# Patient Record
Sex: Male | Born: 1935 | Race: Black or African American | Hispanic: No | Smoking: Current every day smoker
Health system: Southern US, Community
[De-identification: ages and names within clinical notes are randomized; demographics above are authoritative.]

## PROBLEM LIST (undated history)

## (undated) DIAGNOSIS — K219 Gastro-esophageal reflux disease without esophagitis: Secondary | ICD-10-CM

## (undated) DIAGNOSIS — N200 Calculus of kidney: Secondary | ICD-10-CM

## (undated) DIAGNOSIS — I10 Essential (primary) hypertension: Secondary | ICD-10-CM

## (undated) DIAGNOSIS — E785 Hyperlipidemia, unspecified: Secondary | ICD-10-CM

## (undated) HISTORY — DX: Gastro-esophageal reflux disease without esophagitis: K21.9

## (undated) HISTORY — DX: Hyperlipidemia, unspecified: E78.5

## (undated) HISTORY — PX: NO PAST SURGERIES: SHX2092

## (undated) HISTORY — DX: Calculus of kidney: N20.0

---

## 1999-12-30 ENCOUNTER — Emergency Department (HOSPITAL_COMMUNITY): Admission: EM | Admit: 1999-12-30 | Discharge: 1999-12-30 | Payer: Self-pay | Admitting: Emergency Medicine

## 2000-01-08 ENCOUNTER — Emergency Department (HOSPITAL_COMMUNITY): Admission: EM | Admit: 2000-01-08 | Discharge: 2000-01-08 | Payer: Self-pay | Admitting: Emergency Medicine

## 2008-07-19 ENCOUNTER — Emergency Department (HOSPITAL_COMMUNITY): Admission: EM | Admit: 2008-07-19 | Discharge: 2008-07-19 | Payer: Self-pay | Admitting: Emergency Medicine

## 2008-07-28 ENCOUNTER — Encounter: Payer: Self-pay | Admitting: Internal Medicine

## 2008-07-28 LAB — CONVERTED CEMR LAB
Bilirubin Urine: NEGATIVE
Glucose, Bld: 184 mg/dL
Hemoglobin: 12.8 g/dL
Nitrite: NEGATIVE
RBC: 3.96 M/uL
Sodium: 140 meq/L

## 2008-08-02 ENCOUNTER — Ambulatory Visit (HOSPITAL_COMMUNITY): Admission: RE | Admit: 2008-08-02 | Discharge: 2008-08-02 | Payer: Self-pay | Admitting: Urology

## 2008-08-26 ENCOUNTER — Emergency Department (HOSPITAL_COMMUNITY): Admission: EM | Admit: 2008-08-26 | Discharge: 2008-08-26 | Payer: Self-pay | Admitting: Family Medicine

## 2008-08-26 ENCOUNTER — Encounter: Payer: Self-pay | Admitting: Internal Medicine

## 2008-08-26 LAB — CONVERTED CEMR LAB
BUN: 10 mg/dL
Calcium: 8.6 mg/dL
Creatinine, Ser: 0.95 mg/dL
Glucose, Bld: 213 mg/dL
Potassium: 3.3 meq/L

## 2008-09-13 ENCOUNTER — Emergency Department (HOSPITAL_COMMUNITY): Admission: EM | Admit: 2008-09-13 | Discharge: 2008-09-13 | Payer: Self-pay | Admitting: Emergency Medicine

## 2008-09-13 ENCOUNTER — Encounter: Payer: Self-pay | Admitting: Internal Medicine

## 2008-09-13 LAB — CONVERTED CEMR LAB
BUN: 17 mg/dL
Chloride: 103 meq/L
Creatinine, Ser: 1.3 mg/dL
HCT: 42 %
Hemoglobin: 14.3 g/dL

## 2008-09-21 ENCOUNTER — Encounter: Payer: Self-pay | Admitting: Internal Medicine

## 2008-09-21 LAB — CONVERTED CEMR LAB
Calcium: 9.6 mg/dL
Creatinine, Ser: 1.63 mg/dL
Glucose, Bld: 396 mg/dL
HCT: 39.9 %
Hemoglobin: 13.7 g/dL
Potassium: 5.6 meq/L
RBC: 4.33 M/uL
Sodium: 134 meq/L
WBC: 8.7 10*3/uL

## 2008-09-22 ENCOUNTER — Ambulatory Visit (HOSPITAL_COMMUNITY): Admission: RE | Admit: 2008-09-22 | Discharge: 2008-09-22 | Payer: Self-pay | Admitting: Urology

## 2008-10-02 ENCOUNTER — Encounter: Payer: Self-pay | Admitting: Internal Medicine

## 2008-10-02 LAB — CONVERTED CEMR LAB
BUN: 12 mg/dL
Chloride: 103 meq/L
Ketones, urine, test strip: NEGATIVE
Nitrite: NEGATIVE
Potassium: 4.1 meq/L
Sodium: 136 meq/L
Specific Gravity, Urine: 1.011
Urobilinogen, UA: 0.2
pH: 6

## 2008-11-03 ENCOUNTER — Emergency Department (HOSPITAL_COMMUNITY): Admission: EM | Admit: 2008-11-03 | Discharge: 2008-11-03 | Payer: Self-pay | Admitting: Emergency Medicine

## 2008-12-02 ENCOUNTER — Ambulatory Visit (HOSPITAL_COMMUNITY): Admission: RE | Admit: 2008-12-02 | Discharge: 2008-12-02 | Payer: Self-pay | Admitting: Urology

## 2008-12-04 ENCOUNTER — Emergency Department (HOSPITAL_COMMUNITY): Admission: EM | Admit: 2008-12-04 | Discharge: 2008-12-04 | Payer: Self-pay | Admitting: Emergency Medicine

## 2008-12-23 ENCOUNTER — Emergency Department (HOSPITAL_COMMUNITY): Admission: EM | Admit: 2008-12-23 | Discharge: 2008-12-24 | Payer: Self-pay | Admitting: Emergency Medicine

## 2009-01-04 ENCOUNTER — Encounter: Payer: Self-pay | Admitting: Internal Medicine

## 2009-01-04 DIAGNOSIS — N200 Calculus of kidney: Secondary | ICD-10-CM

## 2009-01-04 DIAGNOSIS — I10 Essential (primary) hypertension: Secondary | ICD-10-CM | POA: Insufficient documentation

## 2009-01-04 HISTORY — DX: Calculus of kidney: N20.0

## 2009-01-10 ENCOUNTER — Ambulatory Visit: Payer: Self-pay | Admitting: Internal Medicine

## 2009-01-10 DIAGNOSIS — E119 Type 2 diabetes mellitus without complications: Secondary | ICD-10-CM | POA: Insufficient documentation

## 2009-01-10 DIAGNOSIS — E1165 Type 2 diabetes mellitus with hyperglycemia: Secondary | ICD-10-CM

## 2009-01-10 DIAGNOSIS — K219 Gastro-esophageal reflux disease without esophagitis: Secondary | ICD-10-CM

## 2009-01-11 LAB — CONVERTED CEMR LAB
Basophils Relative: 0.7 % (ref 0.0–3.0)
Calcium: 9.5 mg/dL (ref 8.4–10.5)
HCT: 42.2 % (ref 39.0–52.0)
Lymphs Abs: 2.7 10*3/uL (ref 0.7–4.0)
MCHC: 34.1 g/dL (ref 30.0–36.0)
MCV: 95.8 fL (ref 78.0–100.0)
Monocytes Relative: 10.2 % (ref 3.0–12.0)
Neutro Abs: 5.4 10*3/uL (ref 1.4–7.7)
Neutrophils Relative %: 58.4 % (ref 43.0–77.0)
Platelets: 272 10*3/uL (ref 150.0–400.0)
RBC: 4.41 M/uL (ref 4.22–5.81)
TSH: 1.43 microintl units/mL (ref 0.35–5.50)
WBC: 9.2 10*3/uL (ref 4.5–10.5)

## 2009-01-12 ENCOUNTER — Ambulatory Visit: Payer: Self-pay | Admitting: Internal Medicine

## 2009-01-14 ENCOUNTER — Encounter: Payer: Self-pay | Admitting: Internal Medicine

## 2009-01-14 ENCOUNTER — Ambulatory Visit: Payer: Self-pay | Admitting: Internal Medicine

## 2009-01-18 ENCOUNTER — Ambulatory Visit: Payer: Self-pay | Admitting: Internal Medicine

## 2009-01-18 ENCOUNTER — Encounter (INDEPENDENT_AMBULATORY_CARE_PROVIDER_SITE_OTHER): Payer: Self-pay | Admitting: *Deleted

## 2009-01-18 ENCOUNTER — Telehealth: Payer: Self-pay | Admitting: Internal Medicine

## 2009-01-18 DIAGNOSIS — E785 Hyperlipidemia, unspecified: Secondary | ICD-10-CM

## 2009-01-18 LAB — CONVERTED CEMR LAB
Blood Glucose, Fingerstick: 361
Cholesterol: 212 mg/dL — ABNORMAL HIGH (ref 0–200)
Direct LDL: 117.7 mg/dL
Microalb Creat Ratio: 23 mg/g (ref 0.0–30.0)
Triglycerides: 245 mg/dL — ABNORMAL HIGH (ref 0.0–149.0)

## 2009-01-18 LAB — HM DIABETES FOOT EXAM

## 2009-01-26 ENCOUNTER — Encounter: Payer: Self-pay | Admitting: Internal Medicine

## 2009-01-31 ENCOUNTER — Telehealth: Payer: Self-pay | Admitting: Internal Medicine

## 2009-02-01 ENCOUNTER — Encounter (INDEPENDENT_AMBULATORY_CARE_PROVIDER_SITE_OTHER): Payer: Self-pay | Admitting: *Deleted

## 2009-02-01 ENCOUNTER — Telehealth: Payer: Self-pay | Admitting: Internal Medicine

## 2009-02-04 ENCOUNTER — Encounter (INDEPENDENT_AMBULATORY_CARE_PROVIDER_SITE_OTHER): Payer: Self-pay | Admitting: *Deleted

## 2009-02-23 ENCOUNTER — Ambulatory Visit: Payer: Self-pay | Admitting: Internal Medicine

## 2009-02-23 DIAGNOSIS — R630 Anorexia: Secondary | ICD-10-CM

## 2009-02-23 LAB — CONVERTED CEMR LAB
ALT: 14 units/L (ref 0–53)
AST: 14 units/L (ref 0–37)
Albumin: 4.1 g/dL (ref 3.5–5.2)
Alkaline Phosphatase: 71 units/L (ref 39–117)
BUN: 22 mg/dL (ref 6–23)
CO2: 27 meq/L (ref 19–32)
Calcium: 9.7 mg/dL (ref 8.4–10.5)
Chloride: 106 meq/L (ref 96–112)
Creatinine, Ser: 1.5 mg/dL (ref 0.4–1.5)
Glucose, Bld: 179 mg/dL — ABNORMAL HIGH (ref 70–99)
Total Protein: 7.3 g/dL (ref 6.0–8.3)

## 2009-03-25 ENCOUNTER — Ambulatory Visit: Payer: Self-pay | Admitting: Internal Medicine

## 2009-03-25 ENCOUNTER — Telehealth: Payer: Self-pay | Admitting: Internal Medicine

## 2009-04-19 ENCOUNTER — Ambulatory Visit: Payer: Self-pay | Admitting: Internal Medicine

## 2009-05-10 ENCOUNTER — Emergency Department (HOSPITAL_COMMUNITY): Admission: EM | Admit: 2009-05-10 | Discharge: 2009-05-10 | Payer: Self-pay | Admitting: Emergency Medicine

## 2009-12-12 ENCOUNTER — Emergency Department (HOSPITAL_COMMUNITY): Admission: EM | Admit: 2009-12-12 | Discharge: 2009-12-13 | Payer: Self-pay | Admitting: Emergency Medicine

## 2009-12-13 ENCOUNTER — Emergency Department (HOSPITAL_COMMUNITY): Admission: EM | Admit: 2009-12-13 | Discharge: 2009-12-13 | Payer: Self-pay | Admitting: Emergency Medicine

## 2010-09-03 LAB — CBC
HCT: 44.6 % (ref 39.0–52.0)
Hemoglobin: 15.1 g/dL (ref 13.0–17.0)
MCH: 32.7 pg (ref 26.0–34.0)
MCHC: 33.9 g/dL (ref 30.0–36.0)
MCV: 96.6 fL (ref 78.0–100.0)
Platelets: 203 10*3/uL (ref 150–400)
RDW: 13.5 % (ref 11.5–15.5)

## 2010-09-03 LAB — URINALYSIS, ROUTINE W REFLEX MICROSCOPIC
Nitrite: NEGATIVE
Specific Gravity, Urine: 1.014 (ref 1.005–1.030)
Urobilinogen, UA: 1 mg/dL (ref 0.0–1.0)
pH: 5.5 (ref 5.0–8.0)

## 2010-09-03 LAB — URINE CULTURE: Colony Count: NO GROWTH

## 2010-09-03 LAB — HEPATIC FUNCTION PANEL
ALT: 16 U/L (ref 0–53)
AST: 15 U/L (ref 0–37)
Albumin: 4.2 g/dL (ref 3.5–5.2)
Total Bilirubin: 1.1 mg/dL (ref 0.3–1.2)

## 2010-09-03 LAB — URINE MICROSCOPIC-ADD ON

## 2010-09-03 LAB — BASIC METABOLIC PANEL
BUN: 21 mg/dL (ref 6–23)
CO2: 30 mEq/L (ref 19–32)
GFR calc Af Amer: >60 mL/min (ref >60)
Glucose, Bld: 147 mg/dL — ABNORMAL HIGH (ref 70–99)
Sodium: 147 mEq/L — ABNORMAL HIGH (ref 135–145)

## 2010-09-03 LAB — DIFFERENTIAL
Eosinophils Absolute: 0 10*3/uL (ref 0.0–0.7)
Monocytes Relative: 6 % (ref 3–12)
Neutro Abs: 8.6 10*3/uL — ABNORMAL HIGH (ref 1.7–7.7)

## 2010-09-20 LAB — DIFFERENTIAL
Basophils Absolute: 0.1 10*3/uL (ref 0.0–0.1)
Basophils Relative: 1 % (ref 0–1)
Eosinophils Absolute: 0.4 10*3/uL (ref 0.0–0.7)
Eosinophils Relative: 6 % — ABNORMAL HIGH (ref 0–5)
Lymphocytes Relative: 26 % (ref 12–46)
Monocytes Relative: 13 % — ABNORMAL HIGH (ref 3–12)
Neutrophils Relative %: 55 % (ref 43–77)

## 2010-09-20 LAB — CBC
HCT: 37.2 % — ABNORMAL LOW (ref 39.0–52.0)
Hemoglobin: 13 g/dL (ref 13.0–17.0)
Platelets: 196 10*3/uL (ref 150–400)
WBC: 6.9 10*3/uL (ref 4.0–10.5)

## 2010-09-20 LAB — POCT I-STAT, CHEM 8
BUN: 14 mg/dL (ref 6–23)
Calcium, Ion: 1.24 mmol/L (ref 1.12–1.32)
Creatinine, Ser: 1.2 mg/dL (ref 0.4–1.5)
TCO2: 26 mmol/L (ref 0–100)

## 2010-09-24 LAB — GLUCOSE, CAPILLARY
Glucose-Capillary: 250 mg/dL — ABNORMAL HIGH (ref 70–99)
Glucose-Capillary: 261 mg/dL — ABNORMAL HIGH (ref 70–99)

## 2010-09-25 LAB — BASIC METABOLIC PANEL
Calcium: 9 mg/dL (ref 8.4–10.5)
Creatinine, Ser: 1.08 mg/dL (ref 0.4–1.5)
GFR calc non Af Amer: >60 mL/min (ref >60)
Glucose, Bld: 305 mg/dL — ABNORMAL HIGH (ref 70–99)
Sodium: 136 mEq/L (ref 135–145)

## 2010-09-25 LAB — URINALYSIS, ROUTINE W REFLEX MICROSCOPIC
Bilirubin Urine: NEGATIVE
Nitrite: NEGATIVE
Specific Gravity, Urine: 1.011 (ref 1.005–1.030)
pH: 6 (ref 5.0–8.0)

## 2010-09-25 LAB — URINE MICROSCOPIC-ADD ON

## 2010-09-25 LAB — CBC
Hemoglobin: 11 g/dL — ABNORMAL LOW (ref 13.0–17.0)
Platelets: 188 10*3/uL (ref 150–400)
RDW: 14.5 % (ref 11.5–15.5)

## 2010-09-27 LAB — BASIC METABOLIC PANEL
GFR calc Af Amer: 51 mL/min — ABNORMAL LOW (ref >60)
GFR calc non Af Amer: 42 mL/min — ABNORMAL LOW (ref >60)
Potassium: 5.6 mEq/L — ABNORMAL HIGH (ref 3.5–5.1)
Sodium: 134 mEq/L — ABNORMAL LOW (ref 135–145)

## 2010-09-27 LAB — CBC
HCT: 39.9 % (ref 39.0–52.0)
Hemoglobin: 13.7 g/dL (ref 13.0–17.0)
RBC: 4.33 MIL/uL (ref 4.22–5.81)
WBC: 8.7 10*3/uL (ref 4.0–10.5)

## 2010-09-28 LAB — URINALYSIS, ROUTINE W REFLEX MICROSCOPIC
Glucose, UA: NEGATIVE mg/dL
Hgb urine dipstick: NEGATIVE
Ketones, ur: NEGATIVE mg/dL
Protein, ur: NEGATIVE mg/dL
pH: 6.5 (ref 5.0–8.0)

## 2010-09-28 LAB — POCT I-STAT, CHEM 8
BUN: 17 mg/dL (ref 6–23)
Calcium, Ion: 1.25 mmol/L (ref 1.12–1.32)
Chloride: 103 mEq/L (ref 96–112)
Glucose, Bld: 360 mg/dL — ABNORMAL HIGH (ref 70–99)
Potassium: 4.7 mEq/L (ref 3.5–5.1)

## 2010-09-28 LAB — URINE MICROSCOPIC-ADD ON

## 2010-09-28 LAB — BASIC METABOLIC PANEL
BUN: 10 mg/dL (ref 6–23)
CO2: 24 mEq/L (ref 19–32)
Chloride: 101 mEq/L (ref 96–112)
Creatinine, Ser: 0.95 mg/dL (ref 0.4–1.5)
Potassium: 3.3 mEq/L — ABNORMAL LOW (ref 3.5–5.1)

## 2010-10-03 LAB — URINALYSIS, ROUTINE W REFLEX MICROSCOPIC
Bilirubin Urine: NEGATIVE
Bilirubin Urine: NEGATIVE
Ketones, ur: 15 mg/dL — AB
Ketones, ur: NEGATIVE mg/dL
Nitrite: NEGATIVE
Specific Gravity, Urine: 1.011 (ref 1.005–1.030)
Urobilinogen, UA: 0.2 mg/dL (ref 0.0–1.0)
Urobilinogen, UA: 0.2 mg/dL (ref 0.0–1.0)
pH: 5.5 (ref 5.0–8.0)
pH: 6 (ref 5.0–8.0)

## 2010-10-03 LAB — URINE CULTURE
Colony Count: NO GROWTH
Culture: NO GROWTH

## 2010-10-03 LAB — CBC
HCT: 37.5 % — ABNORMAL LOW (ref 39.0–52.0)
Hemoglobin: 12.8 g/dL — ABNORMAL LOW (ref 13.0–17.0)
Platelets: 129 10*3/uL — ABNORMAL LOW (ref 150–400)
RBC: 4.73 MIL/uL (ref 4.22–5.81)
RBC: 3.96 MIL/uL — ABNORMAL LOW (ref 4.22–5.81)
WBC: 11 10*3/uL — ABNORMAL HIGH (ref 4.0–10.5)

## 2010-10-03 LAB — COMPREHENSIVE METABOLIC PANEL
ALT: 15 U/L (ref 0–53)
AST: 13 U/L (ref 0–37)
Albumin: 3.3 g/dL — ABNORMAL LOW (ref 3.5–5.2)
CO2: 27 mEq/L (ref 19–32)
Chloride: 93 mEq/L — ABNORMAL LOW (ref 96–112)
Creatinine, Ser: 2.35 mg/dL — ABNORMAL HIGH (ref 0.4–1.5)
GFR calc Af Amer: 33 mL/min — ABNORMAL LOW (ref >60)
GFR calc non Af Amer: 27 mL/min — ABNORMAL LOW (ref >60)
Potassium: 4 mEq/L (ref 3.5–5.1)
Sodium: 131 mEq/L — ABNORMAL LOW (ref 135–145)
Total Bilirubin: 1.1 mg/dL (ref 0.3–1.2)

## 2010-10-03 LAB — DIFFERENTIAL
Basophils Absolute: 0 10*3/uL (ref 0.0–0.1)
Eosinophils Absolute: 0 10*3/uL (ref 0.0–0.7)
Eosinophils Relative: 0 % (ref 0–5)
Lymphocytes Relative: 3 % — ABNORMAL LOW (ref 12–46)
Monocytes Absolute: 0.7 10*3/uL (ref 0.1–1.0)

## 2010-10-03 LAB — BASIC METABOLIC PANEL
CO2: 30 mEq/L (ref 19–32)
Glucose, Bld: 184 mg/dL — ABNORMAL HIGH (ref 70–99)
Potassium: 4.7 mEq/L (ref 3.5–5.1)
Sodium: 140 mEq/L (ref 135–145)

## 2010-10-03 LAB — URINE MICROSCOPIC-ADD ON

## 2010-10-31 NOTE — Op Note (Signed)
Vincent Baxter, FESTER NO.:  [id number]   MEDICAL RECORD NO.:  [id number]          PATIENT TYPE:  AMB   LOCATION:  DAY                          FACILITY:  [facility]   PHYSICIAN:  Courtney Paris, M.D.DATE OF BIRTH:  03/13/1935   DATE OF PROCEDURE:  09/22/2008  DATE OF DISCHARGE:                               OPERATIVE REPORT   PREOPERATIVE DIAGNOSIS:  Left distal ureteral stone.   POSTOPERATIVE DIAGNOSIS:  Left distal ureteral stone.   OPERATION PERFORMED:  1. Cystourethroscopy.  2. Left urethroscopy, laser lithotripsy, basket stone extraction.  3. Left ureteral stent placement.   SURGEON:  Courtney Paris, M.D.   RESIDENTGeorgeanna Lea.   ANESTHESIA:  General.   DRAINS:  6 x 26 left double-J ureteral stent with tethers.   COMPLICATIONS:  None.   INDICATIONS FOR PROCEDURE:  The patient is a 76 year old male who in  February was diagnosed with a 1.5 cm left ureterovesical junction stone  on a CT scan done for left flank pain.  The patient underwent left ESWL  on August 02, 2008.  The patient, however, continued to have left  flank pain and on recent CT scan was found to have a left distal  ureteral stone.  Different treatment options were discussed.  The  patient opted for left ureteroscopy.  Risks and benefits of the  procedure were explained.  Informed consent obtained.   DESCRIPTION OF PROCEDURE:  The patient was brought to the operating  room.  Placed in supine position.  Administered general anesthesia by  the anesthesia team.  Proper time out performed confirming correct  patient, procedure and side.  The patient was given an appropriate  preoperative antibiotic.  The patient was subsequently placed in dorsal  lithotomy position.  Pressure points were well padded.  Bilateral lower  extremity SCDs.  The patient was prepped and draped in the usual sterile  manner.  Using a 22 French sheath and 12 degree lens, we then performed  cystourethroscopy.  The patient's anterior urethra was normal.  Posterior urethra revealed mild to moderate bilobar prostatic  enlargement.  Upon entering the bladder, 8 mm stone particle was seen  half way through the left ureteral orifice.  Rest of the cystoscopy did  not reveal any mass lesion or stone.  Attempt was made using cold knife  to get the stone out as it was impacted.  However, during the procedure,  the stone slipped into the left ureteral orifice.  At this point Glide  wire was placed in the left renal pelvis under fluoroscopy.  We then  removed the cystoscope and advanced semirigid ureteroscope into the left  ureteral orifice.  Stone was seen in the very distal part of the ureter.  A 200 micron laser fiber was introduced and the stone was broken down to  tiny pieces.  We then removed the laser fiber and advanced escape basket  and extracted the stone particles.  These were removed from the bladder.  We then removed the ureteroscope and placed a 6 x 26 double-J ureteral  stent with tethers under fluoroscopic  and endoscopic guidance in the  left renal pelvis.  Good proximal and distal coils were seen.  We then  drained the patient's bladder and removed the cystoscope.  We then  secured the tether onto the dorsal aspect of the penis with tape.  This  marked the end of the procedure.  The patient was subsequently extubated  and transferred in stable condition to recovery room.   Of note, Courtney Paris, M.D. was present and participated in all  aspects of the case.      Delman Kitten, MD      Courtney Paris, M.D.  Electronically Signed    DW/MEDQ  D:  09/22/2008  T:  09/22/2008  Job:  [id number]

## 2011-11-14 ENCOUNTER — Emergency Department (HOSPITAL_COMMUNITY)
Admission: EM | Admit: 2011-11-14 | Discharge: 2011-11-14 | Disposition: A | Discharge status: Home / Self Care | Payer: Self-pay | Attending: Emergency Medicine | Admitting: Emergency Medicine

## 2011-11-14 ENCOUNTER — Encounter (HOSPITAL_COMMUNITY): Payer: Self-pay | Admitting: *Deleted

## 2011-11-14 ENCOUNTER — Emergency Department (HOSPITAL_COMMUNITY): Payer: Self-pay

## 2011-11-14 DIAGNOSIS — R5381 Other malaise: Secondary | ICD-10-CM | POA: Insufficient documentation

## 2011-11-14 DIAGNOSIS — R110 Nausea: Secondary | ICD-10-CM | POA: Insufficient documentation

## 2011-11-14 DIAGNOSIS — R059 Cough, unspecified: Secondary | ICD-10-CM | POA: Insufficient documentation

## 2011-11-14 DIAGNOSIS — J438 Other emphysema: Secondary | ICD-10-CM | POA: Diagnosis not present

## 2011-11-14 DIAGNOSIS — E119 Type 2 diabetes mellitus without complications: Secondary | ICD-10-CM | POA: Insufficient documentation

## 2011-11-14 DIAGNOSIS — R918 Other nonspecific abnormal finding of lung field: Secondary | ICD-10-CM | POA: Diagnosis not present

## 2011-11-14 DIAGNOSIS — R079 Chest pain, unspecified: Secondary | ICD-10-CM | POA: Diagnosis not present

## 2011-11-14 DIAGNOSIS — I10 Essential (primary) hypertension: Secondary | ICD-10-CM | POA: Insufficient documentation

## 2011-11-14 DIAGNOSIS — R05 Cough: Secondary | ICD-10-CM | POA: Insufficient documentation

## 2011-11-14 HISTORY — DX: Essential (primary) hypertension: I10

## 2011-11-14 LAB — URINALYSIS, ROUTINE W REFLEX MICROSCOPIC
Bilirubin Urine: NEGATIVE
Glucose, UA: NEGATIVE mg/dL
Hgb urine dipstick: NEGATIVE
Specific Gravity, Urine: 1.015 (ref 1.005–1.030)
pH: 5.5 (ref 5.0–8.0)

## 2011-11-14 LAB — POCT I-STAT, CHEM 8
Calcium, Ion: 1.21 mmol/L (ref 1.12–1.32)
Creatinine, Ser: 1.5 mg/dL — ABNORMAL HIGH (ref 0.50–1.35)
Glucose, Bld: 159 mg/dL — ABNORMAL HIGH (ref 70–99)
HCT: 46 % (ref 39.0–52.0)
Hemoglobin: 15.6 g/dL (ref 13.0–17.0)
Potassium: 4.4 mEq/L (ref 3.5–5.1)
TCO2: 27 mmol/L (ref 0–100)

## 2011-11-14 MED ORDER — ONDANSETRON 4 MG PO TBDP: ORAL_TABLET | ORAL | Status: DC

## 2011-11-14 MED ORDER — ONDANSETRON 4 MG PO TBDP
4.0000 mg | ORAL_TABLET | Freq: Once | ORAL | Status: AC
  Administered 2011-11-14: 4 mg via ORAL
  Filled 2011-11-14: qty 1

## 2011-11-14 NOTE — ED Notes (Addendum)
Pt resting in stretcher in NAD, respirations even and unlabored. 

## 2011-11-14 NOTE — ED Provider Notes (Signed)
History     CSN: [id number]  Arrival date & time 11/14/11  1844   First MD Initiated Contact with Patient 11/14/11 2026      Chief Complaint  Patient presents with  . Weakness  . Nausea    (Consider location/radiation/quality/duration/timing/severity/associated sxs/prior treatment) HPI Comments: Pt with hx of htn, dm - not taking meds x 1 year.  C/o generalized fatigue, nausea, and coughing up mucous worsening over the last few months.  No chest, abdominal pain or dysnea.  Has not seen his PCP because he thought he was better off without meds.  Patient is a 77 y.o. male presenting with weakness. The history is provided by the patient.  Weakness The primary symptoms include nausea. Primary symptoms do not include headaches, loss of consciousness, altered mental status, fever or vomiting. Episode duration: months. The symptoms are unchanged. The neurological symptoms are diffuse. Context: no injury.  Additional symptoms include weakness.    Past Medical History  Diagnosis Date  . Diabetes mellitus   . Hypertension     History reviewed. No pertinent past surgical history.  History reviewed. No pertinent family history.  History  Substance Use Topics  . Smoking status: Not on file  . Smokeless tobacco: Not on file  . Alcohol Use:       Review of Systems  Constitutional: Negative for fever, activity change and fatigue.  HENT: Negative for congestion.   Eyes: Negative for pain.  Respiratory: Negative for chest tightness, shortness of breath, wheezing and stridor.   Cardiovascular: Negative for chest pain and leg swelling.  Gastrointestinal: Positive for nausea. Negative for vomiting.  Genitourinary: Negative for dysuria.  Musculoskeletal: Negative for arthralgias.  Skin: Negative for rash.  Neurological: Positive for weakness. Negative for loss of consciousness and headaches.  Psychiatric/Behavioral: Negative for behavioral problems and altered mental status.     Allergies  Review of patient's allergies indicates no known allergies.  Home Medications   Current Outpatient Rx  Name Route Sig Dispense Refill  . ONDANSETRON 4 MG PO TBDP  4mg  ODT q4 hours prn nausea/vomit 15 tablet 0    BP 168/114  Pulse 93  Temp(Src) 98 F (36.7 C) (Oral)  Resp 18  SpO2 99%  Physical Exam  Constitutional: He is oriented to person, place, and time. He appears well-developed and well-nourished. No distress.  HENT:  Head: Normocephalic and atraumatic.  Eyes: Conjunctivae and EOM are normal. Pupils are equal, round, and reactive to light. No scleral icterus.  Neck: Normal range of motion. Neck supple.  Cardiovascular: Normal rate and regular rhythm.  Exam reveals no gallop and no friction rub.   No murmur heard. Pulmonary/Chest: Effort normal and breath sounds normal. No respiratory distress. He has no wheezes. He has no rales. He exhibits no tenderness.  Abdominal: Soft. He exhibits no distension and no mass. There is no tenderness. There is no rebound and no guarding.  Musculoskeletal: Normal range of motion. He exhibits no edema and no tenderness.  Neurological: He is alert and oriented to person, place, and time. He has normal reflexes. No cranial nerve deficit. He exhibits normal muscle tone. Coordination normal.  Skin: Skin is warm and dry. No rash noted. He is not diaphoretic. No erythema.  Psychiatric: He has a normal mood and affect. His behavior is normal. Judgment and thought content normal.    ED Course  Procedures (including critical care time)   Date: 11/14/2011  Rate: 83  Rhythm: normal sinus rhythm  QRS Axis: normal  Intervals: normal  ST/T Wave abnormalities: normal  Conduction Disutrbances:none  Narrative Interpretation:   Old EKG Reviewed: unchanged    Labs Reviewed  URINALYSIS, ROUTINE W REFLEX MICROSCOPIC - Abnormal; Notable for the following:    Leukocytes, UA TRACE (*)    All other components within normal limits  POCT  I-STAT, CHEM 8 - Abnormal; Notable for the following:    Creatinine, Ser 1.50 (*)    Glucose, Bld 159 (*)    All other components within normal limits  URINE MICROSCOPIC-ADD ON   Dg Chest 2 View  11/14/2011  *RADIOLOGY REPORT*  Clinical Data: Weakness nausea and shortness of breath.  The right lower posterior chest pain.  CHEST - 2 VIEW  Comparison: Two-view chest 05/10/2009.  Findings: The heart size is normal.  Advanced emphysematous changes are again noted.  No focal airspace disease is evident.  The visualized soft tissues and bony thorax are unremarkable.  IMPRESSION:  1.  No acute cardiopulmonary disease or significant interval change. 2.  Emphysema.  Original Report Authenticated By: Jamesetta Orleans. MATTERN, M.D.     1. Cough   2. Nausea       MDM  Pt with hx of htn, dm - not taking meds x 1 year.  C/o generalized fatigue, nausea, and coughing up mucous worsening over the last few months.  No chest, abdominal pain or dysnea.  Has not seen his PCP because he thought he was better off without meds.  VSS and well appearing.  No abdominal ttp on exam.  Doubt acute intraabdominal pathology.  Labs, EKG, CXR unconcerning.  Treating nausea with zofran and PCP f/u.  Pt comfortable with plan and will follow up.         Army Chaco, MD 11/14/11 [phone]

## 2011-11-14 NOTE — ED Notes (Signed)
Urine sent to lab 

## 2011-11-14 NOTE — ED Notes (Signed)
MD Rancour aware of pt's BP and pt ok for d/c.  Pt agreed to quit smoking and is going to call his PCP tomorrow.

## 2011-11-14 NOTE — ED Notes (Signed)
MD at bedside. 

## 2011-11-14 NOTE — ED Notes (Signed)
Pt ambulated around ED, walked to RR with steady gait.  Pt states he vomited on his gown.  No vomit noted on gown.  Provided pt with new gown.

## 2011-11-14 NOTE — ED Notes (Signed)
To ED for eval of feeling weak and with decreased appetite over the past cple months. Pt states he has intermittent flank pain and intermittent cp. States he has been dx with 'sugar things but doesn't take anything for it'. Denies CP. Denies SOB. States he has been urinating a lot and doesn't have the energy to do his daily routine.

## 2011-11-14 NOTE — Discharge Instructions (Signed)
Cough, Adult  A cough is a reflex that helps clear your throat and airways. It can help heal the body or may be a reaction to an irritated airway. A cough may only last 2 or 3 weeks (acute) or may last more than 8 weeks (chronic).  CAUSES Acute cough:  Viral or bacterial infections.  Chronic cough:  Infections.   Allergies.   Asthma.   Post-nasal drip.   Smoking.   Heartburn or acid reflux.   Some medicines.   Chronic lung problems (COPD).   Cancer.  SYMPTOMS   Cough.   Fever.   Chest pain.   Increased breathing rate.   High-pitched whistling sound when breathing (wheezing).   Colored mucus that you cough up (sputum).  TREATMENT   A bacterial cough may be treated with antibiotic medicine.   A viral cough must run its course and will not respond to antibiotics.   Your caregiver may recommend other treatments if you have a chronic cough.  HOME CARE INSTRUCTIONS   Only take over-the-counter or prescription medicines for pain, discomfort, or fever as directed by your caregiver. Use cough suppressants only as directed by your caregiver.   Use a cold steam vaporizer or humidifier in your bedroom or home to help loosen secretions.   Sleep in a semi-upright position if your cough is worse at night.   Rest as needed.   Stop smoking if you smoke.  SEEK IMMEDIATE MEDICAL CARE IF:   You have pus in your sputum.   Your cough starts to worsen.   You cannot control your cough with suppressants and are losing sleep.   You begin coughing up blood.   You have difficulty breathing.   You develop pain which is getting worse or is uncontrolled with medicine.   You have a fever.  MAKE SURE YOU:   Understand these instructions.   Will watch your condition.   Will get help right away if you are not doing well or get worse.  Document Released: 12/01/2010 Document Revised: 05/24/2011 Document Reviewed: 12/01/2010 [facility] Patient Information 2012 [place],  [place].Nausea, Adult Nausea is the feeling that you have an upset stomach or have to vomit. Nausea by itself is not likely a serious concern, but it may be an early sign of more serious medical problems. As nausea gets worse, it can lead to vomiting. If vomiting develops, there is the risk of dehydration.  CAUSES   Viral infections.   Food poisoning.   Medicines.   Pregnancy.   Motion sickness.   Migraine headaches.   Emotional distress.   Severe pain from any source.   Alcohol intoxication.  HOME CARE INSTRUCTIONS  Get plenty of rest.   Ask your caregiver about specific rehydration instructions.   Eat small amounts of food and sip liquids more often.   Take all medicines as told by your caregiver.  SEEK MEDICAL CARE IF:  You have not improved after 2 days, or you get worse.   You have a headache.  SEEK IMMEDIATE MEDICAL CARE IF:   You have a fever.   You faint.   You keep vomiting or have blood in your vomit.   You are extremely weak or dehydrated.   You have dark or bloody stools.   You have severe chest or abdominal pain.  MAKE SURE YOU:  Understand these instructions.   Will watch your condition.   Will get help right away if you are not doing well or get worse.  Document  Released: 07/12/2004 Document Revised: 05/24/2011 Document Reviewed: 02/14/2011 [facility] Patient Information 2012 [place], [place].

## 2011-11-14 NOTE — ED Notes (Signed)
Pt reports he does not want to go home at this time, pt states "feeling like I do, I have [employer], I can't go home feeling like this, I have no appetite, I will not go home, I will stay here a week if I need to."

## 2011-11-15 NOTE — ED Provider Notes (Signed)
I saw and evaluated the patient, reviewed the resident's note and I agree with the findings and plan.  Fatigue, nausea, cough. Noncompliance with DM and HTN meds.  Appears well.  Glynn Octave, MD 11/15/11 1235

## 2011-11-16 ENCOUNTER — Encounter (HOSPITAL_COMMUNITY): Payer: Self-pay | Admitting: *Deleted

## 2011-11-16 ENCOUNTER — Emergency Department (HOSPITAL_COMMUNITY): Payer: Medicare Other

## 2011-11-16 ENCOUNTER — Emergency Department (HOSPITAL_COMMUNITY)
Admission: EM | Admit: 2011-11-16 | Discharge: 2011-11-16 | Disposition: A | Discharge status: Home / Self Care | Payer: Medicare Other | Attending: Emergency Medicine | Admitting: Emergency Medicine

## 2011-11-16 DIAGNOSIS — R109 Unspecified abdominal pain: Secondary | ICD-10-CM | POA: Insufficient documentation

## 2011-11-16 DIAGNOSIS — I714 Abdominal aortic aneurysm, without rupture: Secondary | ICD-10-CM | POA: Diagnosis not present

## 2011-11-16 DIAGNOSIS — N200 Calculus of kidney: Secondary | ICD-10-CM | POA: Insufficient documentation

## 2011-11-16 DIAGNOSIS — Z87442 Personal history of urinary calculi: Secondary | ICD-10-CM | POA: Insufficient documentation

## 2011-11-16 DIAGNOSIS — M549 Dorsalgia, unspecified: Secondary | ICD-10-CM | POA: Insufficient documentation

## 2011-11-16 DIAGNOSIS — R112 Nausea with vomiting, unspecified: Secondary | ICD-10-CM | POA: Insufficient documentation

## 2011-11-16 DIAGNOSIS — E119 Type 2 diabetes mellitus without complications: Secondary | ICD-10-CM | POA: Diagnosis not present

## 2011-11-16 DIAGNOSIS — I10 Essential (primary) hypertension: Secondary | ICD-10-CM | POA: Insufficient documentation

## 2011-11-16 DIAGNOSIS — N269 Renal sclerosis, unspecified: Secondary | ICD-10-CM | POA: Diagnosis not present

## 2011-11-16 DIAGNOSIS — F17200 Nicotine dependence, unspecified, uncomplicated: Secondary | ICD-10-CM | POA: Insufficient documentation

## 2011-11-16 LAB — URINALYSIS, ROUTINE W REFLEX MICROSCOPIC
Bilirubin Urine: NEGATIVE
Glucose, UA: NEGATIVE mg/dL
Ketones, ur: NEGATIVE mg/dL
Specific Gravity, Urine: 1.018 (ref 1.005–1.030)
pH: 5.5 (ref 5.0–8.0)

## 2011-11-16 LAB — POCT I-STAT, CHEM 8
Chloride: 103 mEq/L (ref 96–112)
Glucose, Bld: 182 mg/dL — ABNORMAL HIGH (ref 70–99)
HCT: 44 % (ref 39.0–52.0)
Hemoglobin: 15 g/dL (ref 13.0–17.0)
Potassium: 4.3 mEq/L (ref 3.5–5.1)

## 2011-11-16 LAB — URINE MICROSCOPIC-ADD ON

## 2011-11-16 MED ORDER — SODIUM CHLORIDE 0.9 % IV SOLN
INTRAVENOUS | Status: DC
  Administered 2011-11-16: 1000 mL via INTRAVENOUS

## 2011-11-16 MED ORDER — MORPHINE SULFATE 4 MG/ML IJ SOLN
4.0000 mg | Freq: Once | INTRAMUSCULAR | Status: AC
  Administered 2011-11-16: 4 mg via INTRAVENOUS
  Filled 2011-11-16: qty 1

## 2011-11-16 MED ORDER — ONDANSETRON HCL 4 MG/2ML IJ SOLN
4.0000 mg | Freq: Once | INTRAMUSCULAR | Status: AC
  Administered 2011-11-16: 4 mg via INTRAVENOUS
  Filled 2011-11-16: qty 2

## 2011-11-16 MED ORDER — OXYCODONE-ACETAMINOPHEN 5-325 MG PO TABS
1.0000 | ORAL_TABLET | Freq: Once | ORAL | Status: AC
  Administered 2011-11-16: 1 via ORAL
  Filled 2011-11-16: qty 1

## 2011-11-16 MED ORDER — OXYCODONE-ACETAMINOPHEN 5-325 MG PO TABS: 2.0000 | ORAL_TABLET | ORAL | Status: DC | PRN

## 2011-11-16 MED ORDER — METOCLOPRAMIDE HCL 10 MG PO TABS: 10.0000 mg | ORAL_TABLET | Freq: Four times a day (QID) | ORAL | Status: DC

## 2011-11-16 NOTE — ED Notes (Signed)
Low back pain.  Thinks it's kidney problems.

## 2011-11-16 NOTE — ED Provider Notes (Signed)
History     CSN: [id number]  Arrival date & time 11/16/11  1211   First MD Initiated Contact with Patient 11/16/11 1254      Chief Complaint  Patient presents with  . Back Pain    (Consider location/radiation/quality/duration/timing/severity/associated sxs/prior treatment) Patient is a 77 y.o. male presenting with back pain. The history is provided by the patient and medical records.  Back Pain  Pertinent negatives include no chest pain, no fever, no headaches, no abdominal pain and no dysuria.  the pt has hx of kidney stones.  He has nontraumatic flank pain with n/v. He denies fever, chills, sob, cough. He denies dysuria or hematuria.    Past Medical History  Diagnosis Date  . Diabetes mellitus   . Hypertension     History reviewed. No pertinent past surgical history.  History reviewed. No pertinent family history.  History  Substance Use Topics  . Smoking status: Current Everyday Smoker    Types: Cigarettes  . Smokeless tobacco: Not on file  . Alcohol Use: No      Review of Systems  Constitutional: Negative for fever and chills.  Respiratory: Negative for cough and shortness of breath.   Cardiovascular: Negative for chest pain.  Gastrointestinal: Positive for nausea and vomiting. Negative for abdominal pain.  Genitourinary: Positive for flank pain. Negative for dysuria and hematuria.  Musculoskeletal: Positive for back pain.  Neurological: Negative for headaches.  Psychiatric/Behavioral: Negative for confusion.  All other systems reviewed and are negative.    Allergies  Review of patient's allergies indicates no known allergies.  Home Medications   Current Outpatient Rx  Name Route Sig Dispense Refill  . ONDANSETRON 4 MG PO TBDP Oral Take 4 mg by mouth every 8 (eight) hours as needed. For nausea/vomiting.      BP 175/100  Pulse 114  Temp(Src) 98.6 F (37 C) (Oral)  Resp 18  Ht 5\' 6"  (1.676 m)  Wt 120 lb (54.432 kg)  BMI 19.37 kg/m2  SpO2  100%  Physical Exam  Nursing note and vitals reviewed. Constitutional: He is oriented to person, place, and time. No distress.  HENT:  Head: Normocephalic and atraumatic.  Eyes: Conjunctivae and EOM are normal.  Neck: Normal range of motion. Neck supple.  Cardiovascular: Regular rhythm.   No murmur heard.      tachycardia  Pulmonary/Chest: Effort normal and breath sounds normal. No respiratory distress.  Abdominal: Soft. He exhibits no distension. There is no tenderness.  Genitourinary:       bilat cvat  Musculoskeletal: Normal range of motion. He exhibits no edema.  Neurological: He is alert and oriented to person, place, and time.  Skin: Skin is warm and dry.  Psychiatric: He has a normal mood and affect. Thought content normal.    ED Course  Procedures (including critical care time)  Labs Reviewed  URINALYSIS, ROUTINE W REFLEX MICROSCOPIC - Abnormal; Notable for the following:    Hgb urine dipstick MODERATE (*)    Protein, ur 30 (*)    Leukocytes, UA TRACE (*)    All other components within normal limits  URINE MICROSCOPIC-ADD ON - Abnormal; Notable for the following:    Squamous Epithelial / LPF FEW (*)    Bacteria, UA FEW (*)    All other components within normal limits   Dg Chest 2 View  11/14/2011  *RADIOLOGY REPORT*  Clinical Data: Weakness nausea and shortness of breath.  The right lower posterior chest pain.  CHEST - 2 VIEW  Comparison:  Two-view chest 05/10/2009.  Findings: The heart size is normal.  Advanced emphysematous changes are again noted.  No focal airspace disease is evident.  The visualized soft tissues and bony thorax are unremarkable.  IMPRESSION:  1.  No acute cardiopulmonary disease or significant interval change. 2.  Emphysema.  Original Report Authenticated By: Jamesetta Orleans. MATTERN, M.D.     No diagnosis found.  3:06 PM Pain improved  MDM  Flank pain        Cheri Guppy, MD 11/16/11 [phone]

## 2011-11-16 NOTE — Discharge Instructions (Signed)
Use percocet for pain and reglan for nausea and vomiting.   Follow up with Dr. Mena Goes for reevaluation. Call for appointment.

## 2011-11-16 NOTE — ED Notes (Signed)
Pt c/o bilateral lower back pain states "I have a hx of kidney stones." states pain is similar to previous time with kidney stone. Pt denies hematuria, or dysuria. Reports nausea and vomiting 1 night ago. States "my main focus is my kidneys"

## 2011-11-17 ENCOUNTER — Emergency Department (HOSPITAL_COMMUNITY)
Admission: EM | Admit: 2011-11-17 | Discharge: 2011-11-17 | Disposition: A | Discharge status: Home / Self Care | Payer: Medicare Other | Attending: Emergency Medicine | Admitting: Emergency Medicine

## 2011-11-17 DIAGNOSIS — R109 Unspecified abdominal pain: Secondary | ICD-10-CM | POA: Insufficient documentation

## 2011-11-17 DIAGNOSIS — Z760 Encounter for issue of repeat prescription: Secondary | ICD-10-CM | POA: Diagnosis not present

## 2011-11-17 DIAGNOSIS — N269 Renal sclerosis, unspecified: Secondary | ICD-10-CM | POA: Diagnosis not present

## 2011-11-17 DIAGNOSIS — I714 Abdominal aortic aneurysm, without rupture: Secondary | ICD-10-CM | POA: Diagnosis not present

## 2011-11-17 DIAGNOSIS — N200 Calculus of kidney: Secondary | ICD-10-CM | POA: Diagnosis not present

## 2011-11-17 MED ORDER — OXYCODONE-ACETAMINOPHEN 5-325 MG PO TABS: 2.0000 | ORAL_TABLET | ORAL | Status: DC | PRN

## 2011-11-17 MED ORDER — METOCLOPRAMIDE HCL 10 MG PO TABS: 10.0000 mg | ORAL_TABLET | Freq: Four times a day (QID) | ORAL | Status: DC

## 2011-11-17 MED ORDER — OXYCODONE-ACETAMINOPHEN 5-325 MG PO TABS: 1.0000 | ORAL_TABLET | ORAL | Status: DC | PRN

## 2011-11-17 NOTE — Discharge Instructions (Signed)
Vincent Baxter take the percocet for your kidney stone pain but do not drive with this medication.  Return for any concerns.

## 2011-11-18 NOTE — ED Provider Notes (Signed)
This patient was not registered.  Rx only for pain meds.  He saw Dr. Weldon Inches yesterday for kidney stone and needs pain meds.   Remi Haggard, NP 11/18/11 1307

## 2011-11-19 DIAGNOSIS — N200 Calculus of kidney: Secondary | ICD-10-CM | POA: Diagnosis not present

## 2011-11-19 DIAGNOSIS — M545 Low back pain: Secondary | ICD-10-CM | POA: Diagnosis not present

## 2011-11-22 NOTE — ED Provider Notes (Signed)
Medical screening examination/treatment/procedure(s) were performed by non-physician practitioner and as supervising physician I was immediately available for consultation/collaboration.  Raeford Razor, MD 11/22/11 [phone]

## 2011-12-04 ENCOUNTER — Encounter: Payer: Self-pay | Admitting: Internal Medicine

## 2011-12-11 DIAGNOSIS — M545 Low back pain: Secondary | ICD-10-CM | POA: Diagnosis not present

## 2011-12-11 DIAGNOSIS — N200 Calculus of kidney: Secondary | ICD-10-CM | POA: Diagnosis not present

## 2012-02-11 ENCOUNTER — Other Ambulatory Visit (INDEPENDENT_AMBULATORY_CARE_PROVIDER_SITE_OTHER): Payer: Medicare Other

## 2012-02-11 ENCOUNTER — Encounter: Payer: Self-pay | Admitting: Internal Medicine

## 2012-02-11 ENCOUNTER — Ambulatory Visit (INDEPENDENT_AMBULATORY_CARE_PROVIDER_SITE_OTHER): Payer: Medicare Other | Admitting: Internal Medicine

## 2012-02-11 VITALS — BP 148/78 | HR 70 | Temp 97.7°F | Ht 70.0 in | Wt 124.8 lb

## 2012-02-11 DIAGNOSIS — I10 Essential (primary) hypertension: Secondary | ICD-10-CM

## 2012-02-11 DIAGNOSIS — IMO0001 Reserved for inherently not codable concepts without codable children: Secondary | ICD-10-CM | POA: Diagnosis not present

## 2012-02-11 DIAGNOSIS — E785 Hyperlipidemia, unspecified: Secondary | ICD-10-CM

## 2012-02-11 DIAGNOSIS — R109 Unspecified abdominal pain: Secondary | ICD-10-CM

## 2012-02-11 DIAGNOSIS — Z1211 Encounter for screening for malignant neoplasm of colon: Secondary | ICD-10-CM

## 2012-02-11 DIAGNOSIS — J438 Other emphysema: Secondary | ICD-10-CM | POA: Diagnosis not present

## 2012-02-11 DIAGNOSIS — Z136 Encounter for screening for cardiovascular disorders: Secondary | ICD-10-CM | POA: Diagnosis not present

## 2012-02-11 DIAGNOSIS — Z23 Encounter for immunization: Secondary | ICD-10-CM | POA: Diagnosis not present

## 2012-02-11 DIAGNOSIS — J439 Emphysema, unspecified: Secondary | ICD-10-CM | POA: Insufficient documentation

## 2012-02-11 LAB — CBC WITH DIFFERENTIAL/PLATELET
Basophils Absolute: 0.1 10*3/uL (ref 0.0–0.1)
Eosinophils Absolute: 0.3 10*3/uL (ref 0.0–0.7)
Lymphocytes Relative: 19 % (ref 12.0–46.0)
MCHC: 33.1 g/dL (ref 30.0–36.0)
MCV: 97.9 fl (ref 78.0–100.0)
Monocytes Absolute: 0.6 10*3/uL (ref 0.1–1.0)
Neutrophils Relative %: 69.8 % (ref 43.0–77.0)
Platelets: 182 10*3/uL (ref 150.0–400.0)
RBC: 4.14 Mil/uL — ABNORMAL LOW (ref 4.22–5.81)
RDW: 13.6 % (ref 11.5–14.6)

## 2012-02-11 LAB — BASIC METABOLIC PANEL
BUN: 16 mg/dL (ref 6–23)
CO2: 30 mEq/L (ref 19–32)
Calcium: 9.1 mg/dL (ref 8.4–10.5)
Chloride: 103 mEq/L (ref 96–112)
Creatinine, Ser: 1.2 mg/dL (ref 0.4–1.5)
Glucose, Bld: 115 mg/dL — ABNORMAL HIGH (ref 70–99)

## 2012-02-11 LAB — URINALYSIS, ROUTINE W REFLEX MICROSCOPIC
Hgb urine dipstick: NEGATIVE
Leukocytes, UA: NEGATIVE
Specific Gravity, Urine: 1.01 (ref 1.000–1.030)
Urobilinogen, UA: 1 (ref 0.0–1.0)

## 2012-02-11 LAB — HEPATIC FUNCTION PANEL
AST: 16 U/L (ref 0–37)
Albumin: 3.9 g/dL (ref 3.5–5.2)
Alkaline Phosphatase: 106 U/L (ref 39–117)
Bilirubin, Direct: 0.1 mg/dL (ref 0.0–0.3)

## 2012-02-11 LAB — LIPID PANEL
Total CHOL/HDL Ratio: 3
Triglycerides: 106 mg/dL (ref 0.0–149.0)

## 2012-02-11 MED ORDER — PIOGLITAZONE HCL 30 MG PO TABS: 30.0000 mg | ORAL_TABLET | Freq: Every day | ORAL | Status: DC

## 2012-02-11 MED ORDER — OMEPRAZOLE 20 MG PO CPDR: 20.0000 mg | DELAYED_RELEASE_CAPSULE | Freq: Every day | ORAL | Status: DC

## 2012-02-11 MED ORDER — ALBUTEROL SULFATE HFA 108 (90 BASE) MCG/ACT IN AERS: 2.0000 | INHALATION_SPRAY | Freq: Four times a day (QID) | RESPIRATORY_TRACT | Status: DC | PRN

## 2012-02-11 MED ORDER — BENAZEPRIL HCL 10 MG PO TABS: 10.0000 mg | ORAL_TABLET | Freq: Every day | ORAL | Status: DC

## 2012-02-11 MED ORDER — SIMVASTATIN 10 MG PO TABS: 10.0000 mg | ORAL_TABLET | Freq: Every day | ORAL | Status: DC

## 2012-02-11 NOTE — Assessment & Plan Note (Addendum)
There have been some probable medication, dietary and lifestyle compliance issues here. I have discussed with him the great importance of following the treatment plan exactly as directed in order to achieve a good medical outcome. New a1c 01/2012 shows diet controlled - no need to resume actos as previously prescribed Lab Results  Component Value Date   HGBA1C 9.7* 01/18/2009

## 2012-02-11 NOTE — Patient Instructions (Addendum)
It was good to see you today. Health Maintenance reviewed - tetnus and pneumonia shots recommended today - all recommended immunizations and age-appropriate screenings are up-to-date. we'll make referral to GI for screening colonoscopy to look for colon cancer. Our office will contact you regarding appointment(s) once made. Test(s) ordered today - blood and CT scan for abdominal pain evaluation. Your results will be called to you after review (48-72hours after test completion). If any changes need to be made, you will be notified at that time. Start albuterol inhaler for your cough and stop smoking Medications reviewed, no changes at this time. Refill on medication(s) as discussed today. Please schedule followup in 3-4 months for diabetes mellitus and blood pressure check, call sooner if problems.  Health Maintenance, Males A healthy lifestyle and preventative care can promote health and wellness.  Maintain regular health, dental, and eye exams.   Eat a healthy diet. Foods like vegetables, fruits, whole grains, low-fat dairy products, and lean protein foods contain the nutrients you need without too many calories. Decrease your intake of foods high in solid fats, added sugars, and salt. Get information about a proper diet from your caregiver, if necessary.   Regular physical exercise is one of the most important things you can do for your health. Most adults should get at least 150 minutes of moderate-intensity exercise (any activity that increases your heart rate and causes you to sweat) each week. In addition, most adults need muscle-strengthening exercises on 2 or more days a week.     Maintain a healthy weight. The body mass index (BMI) is a screening tool to identify possible weight problems. It provides an estimate of body fat based on height and weight. Your caregiver can help determine your BMI, and can help you achieve or maintain a healthy weight. For adults 20 years and older:   A BMI  below 18.5 is considered underweight.   A BMI of 18.5 to 24.9 is normal.   A BMI of 25 to 29.9 is considered overweight.   A BMI of 30 and above is considered obese.   Maintain normal blood lipids and cholesterol by exercising and minimizing your intake of saturated fat. Eat a balanced diet with plenty of fruits and vegetables. Blood tests for lipids and cholesterol should begin at age 51 and be repeated every 5 years. If your lipid or cholesterol levels are high, you are over 50, or you are a high risk for heart disease, you may need your cholesterol levels checked more frequently. Ongoing high lipid and cholesterol levels should be treated with medicines, if diet and exercise are not effective.   If you smoke, find out from your caregiver how to quit. If you do not use tobacco, do not start.   If you choose to drink alcohol, do not exceed 2 drinks per day. One drink is considered to be 12 ounces (355 mL) of beer, 5 ounces (148 mL) of wine, or 1.5 ounces (44 mL) of liquor.   Avoid use of street drugs. Do not share needles with anyone. Ask for help if you need support or instructions about stopping the use of drugs.   High blood pressure causes heart disease and increases the risk of stroke. Blood pressure should be checked at least every 1 to 2 years. Ongoing high blood pressure should be treated with medicines if weight loss and exercise are not effective.   If you are 68 to 75 years old, ask your caregiver if you should take aspirin  to prevent heart disease.   Diabetes screening involves taking a blood sample to check your fasting blood sugar level. This should be done once every 3 years, after age 76, if you are within normal weight and without risk factors for diabetes. Testing should be considered at a younger age or be carried out more frequently if you are overweight and have at least 1 risk factor for diabetes.   Colorectal cancer can be detected and often prevented. Most routine  colorectal cancer screening begins at the age of 34 and continues through age 80. However, your caregiver may recommend screening at an earlier age if you have risk factors for colon cancer. On a yearly basis, your caregiver may provide home test kits to check for hidden blood in the stool. Use of a small camera at the end of a tube, to directly examine the colon (sigmoidoscopy or colonoscopy), can detect the earliest forms of colorectal cancer. Talk to your caregiver about this at age 52, when routine screening begins. Direct examination of the colon should be repeated every 5 to 10 years through age 65, unless early forms of pre-cancerous polyps or small growths are found.   Hepatitis C blood testing is recommended for all people born from [birth year] through 1963 and any individual with known risks for hepatitis C.   Healthy men should no longer receive prostate-specific antigen (PSA) blood tests as part of routine cancer screening. Consult with your caregiver about prostate cancer screening.   Testicular cancer screening is not recommended for adolescents or adult males who have no symptoms. Screening includes self-exam, caregiver exam, and other screening tests. Consult with your caregiver about any symptoms you have or any concerns you have about testicular cancer.   Practice safe sex. Use condoms and avoid high-risk sexual practices to reduce the spread of sexually transmitted infections (STIs).   Use sunscreen with a sun protection factor (SPF) of 30 or greater. Apply sunscreen liberally and repeatedly throughout the day. You should seek shade when your shadow is shorter than you. Protect yourself by wearing long sleeves, pants, a wide-brimmed hat, and sunglasses year round, whenever you are outdoors.   Notify your caregiver of new moles or changes in moles, especially if there is a change in shape or color. Also notify your caregiver if a mole is larger than the size of a pencil eraser.   A one-time  screening for abdominal aortic aneurysm (AAA) and surgical repair of large AAAs by sound wave imaging (ultrasonography) is recommended for ages 48 to 31 years who are current or former smokers.   Stay current with your immunizations.  Document Released: 12/01/2007 Document Revised: 05/24/2011 Document Reviewed: 10/30/2010 [facility] Patient Information 2012 [place], [place].

## 2012-02-11 NOTE — Addendum Note (Signed)
Addended by: Rene Paci A on: 02/11/2012 01:02 PM   Modules accepted: Orders

## 2012-02-11 NOTE — Progress Notes (Signed)
Subjective:    Patient ID: Vincent Baxter, male    DOB: January 25, 1935, 77 y.o.   MRN: [id number]  HPI   Here for medicare wellness  Diet: heart healthy and diabetic recommended Physical activity: sedentary Depression/mood screen: negative Hearing: intact to whispered voice Visual acuity: grossly normal  ADLs: capable Fall risk: none Home safety: good Cognitive evaluation: intact to orientation, naming, recall and repetition EOL planning: adv directives, full code/ I agree  I have personally reviewed and have noted 1. The patient's medical and social history 2. Their use of alcohol, tobacco or illicit drugs 3. Their current medications and supplements 4. The patient's functional ability including ADL's, fall risks, home safety risks and hearing or visual impairment. 5. Diet and physical activities 6. Evidence for depression or mood disorders  Also reviewed chronic medical issues: - last OV 02/2009 - noncompliance with meds >73mo Diabetes 2- dx 01/2009 - not checking home cbgs Denies PU/PD he did not pick up refills on his prescriptions   dyslipidemia. Not taking Zocor low-dose as prescribed   GERD, chronic anorexia -"no appetite" less because of a "bad taste in his mouth" EGD 02/2009 reviewed  Denies mouth pain, throat pain or epigatric pain - RUQ and flank pain as per CC, ongoing >6 weeks without change Associated with mild continued weight loss (137.17 January 2009, now 124.8) Not taking Prilosec as prescribed   also complains of cough with clear mucus Occurs primarily at night  No chest pain no shortness of breath. No fever or chills No sick contacts known   Past Medical History  Diagnosis Date  . Diabetes mellitus 01/2009 dx  . Hypertension   . DYSLIPIDEMIA   . GERD   . RENAL CALCULUS 01/04/2009   Family History  Problem Relation Age of Onset  . Hypertension Mother     History  Substance Use Topics  . Smoking status: Current Everyday Smoker    Types:  Cigarettes  . 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 Alcohol Use: No    Review of Systems Constitutional: Negative for fever or weight change.  Respiratory: Negative for cough and shortness of breath.   Cardiovascular: Negative for chest pain or palpitations.  Gastrointestinal: Negative for abdominal pain, no bowel changes.  Musculoskeletal: Negative for gait problem or joint swelling.  Skin: Negative for rash.  Neurological: Negative for dizziness or headache.  No other specific complaints in a complete review of systems (except as listed in HPI above).     Objective:   Physical Exam BP 148/78  Pulse 70  Temp 97.7 F (36.5 C) (Oral)  Ht 5\' 10"  (1.778 m)  Wt 124 lb 12.8 oz (56.609 kg)  BMI 17.91 kg/m2  SpO2 97% Wt Readings from Last 3 Encounters:  02/11/12 124 lb 12.8 oz (56.609 kg)  11/16/11 120 lb (54.432 kg)  02/23/09 133 lb 9.6 oz (60.601 kg)   Constitutional:  He is thin, appears well-developed and well-nourished. No distress.  HENT: NCAT. Sinus non tender - TMs clear Eyes: PERRL, EOMI - no icterus or conjunctivitis Neck: Normal range of motion. Neck supple. No JVD present. No thyromegaly present.  Cardiovascular: Normal rate, regular rhythm and normal heart sounds.  No murmur heard. no BLE edema Pulmonary/Chest: Effort normal and breath sounds normal. No respiratory distress. no wheezes.  Abdominal: Soft. Bowel sounds are normal. Patient exhibits no distension. There is no tenderness over R flank.  Musculoskeletal: Normal range of motion.  Patient exhibits no gross deformity.  Neurological: he is alert and oriented to person, place, and time. No cranial nerve deficit. Coordination normal.  Skin: Skin is warm and dry.  No erythema or ulceration.  Psychiatric: he has a normal mood and affect. behavior is normal. Judgment and thought content normal.   Lab Results  Component Value Date   WBC 10.4 12/12/2009   HGB 15.0  11/16/2011   HCT 44.0 11/16/2011   PLT 203 12/12/2009   GLUCOSE 182* 11/16/2011   CHOL 212* 01/18/2009   TRIG 245.0* 01/18/2009   HDL 36.60* 01/18/2009   LDLDIRECT 117.7 01/18/2009   ALT 16 12/12/2009   AST 15 12/12/2009   NA 138 11/16/2011   K 4.3 11/16/2011   CL 103 11/16/2011   CREATININE 1.50* 11/16/2011   BUN 31* 11/16/2011   CO2 30 12/12/2009   TSH 1.43 01/10/2009   HGBA1C 9.7* 01/18/2009   MICROALBUR 1.7 01/18/2009   ECG: sinus @ 64 bpm- no ischemic change or arrythmia    Assessment & Plan:  AWV/v70.0 - Today patient counseled on age appropriate routine health concerns for screening and prevention, each reviewed and up to date or declined. Immunizations reviewed and up to date or declined. Labs/ECG reviewed. Risk factors for depression reviewed and negative. Hearing function and visual acuity are intact. ADLs screened and addressed as needed. Functional ability and level of safety reviewed and appropriate. Education, counseling and referrals performed based on assessed risks today. Patient provided with a copy of personalized plan for preventive services.  Cough - CXR 10/2011 reviewed: emphysema - advised on smoking cessation and Albuterol MDI rx'd  R side abd pain, not reproducible on exam, ongoing without change or radiation >6 weeks - hx kidney stones - check CT a/p r/o stone - also UA and LFTs, CBC - tylenol prn advised   Also See problem list. Medications and labs reviewed today.

## 2012-02-11 NOTE — Assessment & Plan Note (Signed)
BP Readings from Last 3 Encounters:  02/11/12 148/78  11/17/11 160/93  11/16/11 121/78   There have been some probable medication, dietary and lifestyle compliance issues here. I have discussed with him the great importance of following the treatment plan exactly as directed in order to achieve a good medical outcome. Resume med tx as previously rx'd

## 2012-02-11 NOTE — Assessment & Plan Note (Addendum)
There have been some probable medication, dietary and lifestyle compliance issues here. I have discussed with him the great importance of following the treatment plan exactly as directed in order to achieve a good medical outcome. Lipids 01/2012 show diet control - no need to resume statin at this time

## 2012-02-12 ENCOUNTER — Encounter: Payer: Self-pay | Admitting: *Deleted

## 2012-02-13 ENCOUNTER — Other Ambulatory Visit: Payer: Medicare Other

## 2012-02-13 ENCOUNTER — Telehealth: Payer: Self-pay | Admitting: *Deleted

## 2012-02-13 NOTE — Telephone Encounter (Signed)
Pt walk-in stating his sister gave him a the msg. Inform pt did mail him a letter, but gave him the results & let him know md wanted him to start back taking the benazepril. Pt inform me he will need new rx. Let him know will send to his pharmacy...Raechel Chute

## 2012-05-12 ENCOUNTER — Ambulatory Visit (INDEPENDENT_AMBULATORY_CARE_PROVIDER_SITE_OTHER)
Admission: RE | Admit: 2012-05-12 | Discharge: 2012-05-12 | Disposition: A | Discharge status: Home / Self Care | Payer: Medicare Other | Source: Ambulatory Visit | Attending: Internal Medicine | Admitting: Internal Medicine

## 2012-05-12 ENCOUNTER — Ambulatory Visit (INDEPENDENT_AMBULATORY_CARE_PROVIDER_SITE_OTHER): Payer: Medicare Other | Admitting: Internal Medicine

## 2012-05-12 ENCOUNTER — Other Ambulatory Visit (INDEPENDENT_AMBULATORY_CARE_PROVIDER_SITE_OTHER): Payer: Medicare Other

## 2012-05-12 ENCOUNTER — Encounter: Payer: Self-pay | Admitting: Internal Medicine

## 2012-05-12 VITALS — BP 132/84 | HR 70 | Temp 98.1°F | Ht 70.0 in | Wt 127.0 lb

## 2012-05-12 DIAGNOSIS — I714 Abdominal aortic aneurysm, without rupture, unspecified: Secondary | ICD-10-CM

## 2012-05-12 DIAGNOSIS — R0781 Pleurodynia: Secondary | ICD-10-CM

## 2012-05-12 DIAGNOSIS — R079 Chest pain, unspecified: Secondary | ICD-10-CM

## 2012-05-12 DIAGNOSIS — IMO0001 Reserved for inherently not codable concepts without codable children: Secondary | ICD-10-CM

## 2012-05-12 DIAGNOSIS — I10 Essential (primary) hypertension: Secondary | ICD-10-CM | POA: Diagnosis not present

## 2012-05-12 DIAGNOSIS — F17200 Nicotine dependence, unspecified, uncomplicated: Secondary | ICD-10-CM | POA: Diagnosis not present

## 2012-05-12 DIAGNOSIS — R0789 Other chest pain: Secondary | ICD-10-CM

## 2012-05-12 MED ORDER — ALBUTEROL SULFATE HFA 108 (90 BASE) MCG/ACT IN AERS: 2.0000 | INHALATION_SPRAY | Freq: Four times a day (QID) | RESPIRATORY_TRACT | Status: DC | PRN

## 2012-05-12 MED ORDER — TRAMADOL HCL 50 MG PO TABS: 50.0000 mg | ORAL_TABLET | Freq: Four times a day (QID) | ORAL | Status: DC | PRN

## 2012-05-12 MED ORDER — PIOGLITAZONE HCL 15 MG PO TABS: 15.0000 mg | ORAL_TABLET | Freq: Every day | ORAL | Status: DC

## 2012-05-12 NOTE — Assessment & Plan Note (Signed)
There have been some probable medication, dietary and lifestyle compliance issues here. I have discussed with him the great importance of following the treatment plan exactly as directed in order to achieve a good medical outcome. New a1c 01/2012 shows good control -  Continue low dose actos -takes intermittently Lab Results  Component Value Date   HGBA1C 6.4 02/11/2012

## 2012-05-12 NOTE — Progress Notes (Signed)
  Subjective:    Patient ID: Vincent Baxter, male    DOB: 1935/05/09, 77 y.o.   MRN: [id number]  HPI  here for follow up - reviewed chronic medical issues:   Diabetes 2- dx 01/2009 -on actos "now and then", does not check home cbgs -denies PU/PD -   dyslipidemia. Not taking Zocor low-dose as prescribed   GERD, chronic anorexia -"no appetite" less because of a "bad taste in his mouth" EGD 02/2009 reviewed : gastritis  Denies mouth pain, throat pain or epigatric pain - RUQ and flank pain as per CC, unchanged Associated with mild continued weight loss (137.17 January 2009, now 127) Not taking Prilosec as prescribed    Past Medical History  Diagnosis Date  . Diabetes mellitus 01/2009 dx  . Hypertension   . DYSLIPIDEMIA   . GERD   . RENAL CALCULUS 01/04/2009    Review of Systems  Constitutional: Negative for fever or weight change.  Respiratory: Negative for cough and shortness of breath.   Cardiovascular: Negative for chest pain or palpitations.      Objective:   Physical Exam  BP 132/84  Pulse 70  Temp 98.1 F (36.7 C) (Oral)  Ht 5\' 10"  (1.778 m)  Wt 127 lb (57.607 kg)  BMI 18.22 kg/m2  SpO2 99% Wt Readings from Last 3 Encounters:  05/12/12 127 lb (57.607 kg)  02/11/12 124 lb 12.8 oz (56.609 kg)  11/16/11 120 lb (54.432 kg)   Constitutional:  He is thin, appears well-developed and well-nourished. No distress.  Neck: Normal range of motion. Neck supple. No JVD present. No thyromegaly present.  Cardiovascular: Normal rate, regular rhythm and normal heart sounds.  No murmur heard. no BLE edema Pulmonary/Chest: Effort normal and breath sounds normal. No respiratory distress. no wheezes.  Abdominal: Soft. Bowel sounds are normal. Patient exhibits no distension. There is no tenderness over R flank/R posterior ribs.  Neurological: he is alert and oriented to person, place, and time. No cranial nerve deficit. Coordination normal.  Psychiatric: he has a distractable and  expanisve mood and affect. behavior is normal. Judgment and thought content normal.   Lab Results  Component Value Date   WBC 8.3 02/11/2012   HGB 13.4 02/11/2012   HCT 40.5 02/11/2012   PLT 182.0 02/11/2012   GLUCOSE 115* 02/11/2012   CHOL 151 02/11/2012   TRIG 106.0 02/11/2012   HDL 58.50 02/11/2012   LDLDIRECT 117.7 01/18/2009   LDLCALC 71 02/11/2012   ALT 15 02/11/2012   AST 16 02/11/2012   NA 139 02/11/2012   K 4.1 02/11/2012   CL 103 02/11/2012   CREATININE 1.2 02/11/2012   BUN 16 02/11/2012   CO2 30 02/11/2012   TSH 1.43 01/10/2009   HGBA1C 6.4 02/11/2012   MICROALBUR 1.7 01/18/2009       Assessment & Plan:   R side chest wall pai/rib pain, chronic for > 5 years, not reproducible on exam, ongoing without change or radiation >6 weeks - hx kidney stones on CT 10/2011 - check CXR now and use tramadol/tylenol prn advised   Also See problem list. Medications and labs reviewed today.

## 2012-05-12 NOTE — Assessment & Plan Note (Signed)
Advised on same and importance of smoking cessation

## 2012-05-12 NOTE — Assessment & Plan Note (Signed)
BP Readings from Last 3 Encounters:  05/12/12 132/84  02/11/12 148/78  11/17/11 160/93   There have been some probable medication, dietary and lifestyle compliance issues here. I have discussed with him the great importance of following the treatment plan exactly as directed in order to achieve a good medical outcome.

## 2012-05-12 NOTE — Patient Instructions (Signed)
It was good to see you today. Medications reviewed, Refill on medication(s) as discussed today to [facility] on [address] Test(s) ordered today. Your results will be released to MyChart (or called to you) after review, usually within 72hours after test completion. If any changes need to be made, you will be notified at that same time. Continue to think about giving up cigarettes! Use nicotine gum, nicotine patches or electronic cigarettes. If you're interested in medication to help you quit, please call  - let me know how I can help!

## 2012-10-29 ENCOUNTER — Encounter (HOSPITAL_COMMUNITY): Payer: Self-pay | Admitting: Emergency Medicine

## 2012-10-29 DIAGNOSIS — F17200 Nicotine dependence, unspecified, uncomplicated: Secondary | ICD-10-CM | POA: Insufficient documentation

## 2012-10-29 DIAGNOSIS — E785 Hyperlipidemia, unspecified: Secondary | ICD-10-CM | POA: Diagnosis not present

## 2012-10-29 DIAGNOSIS — S81809A Unspecified open wound, unspecified lower leg, initial encounter: Secondary | ICD-10-CM | POA: Insufficient documentation

## 2012-10-29 DIAGNOSIS — I10 Essential (primary) hypertension: Secondary | ICD-10-CM | POA: Diagnosis not present

## 2012-10-29 DIAGNOSIS — Z203 Contact with and (suspected) exposure to rabies: Secondary | ICD-10-CM | POA: Insufficient documentation

## 2012-10-29 DIAGNOSIS — Z23 Encounter for immunization: Secondary | ICD-10-CM | POA: Diagnosis not present

## 2012-10-29 DIAGNOSIS — Z79899 Other long term (current) drug therapy: Secondary | ICD-10-CM | POA: Insufficient documentation

## 2012-10-29 DIAGNOSIS — Y9229 Other specified public building as the place of occurrence of the external cause: Secondary | ICD-10-CM | POA: Insufficient documentation

## 2012-10-29 DIAGNOSIS — S81009A Unspecified open wound, unspecified knee, initial encounter: Secondary | ICD-10-CM | POA: Insufficient documentation

## 2012-10-29 DIAGNOSIS — W540XXA Bitten by dog, initial encounter: Secondary | ICD-10-CM | POA: Insufficient documentation

## 2012-10-29 DIAGNOSIS — Z87442 Personal history of urinary calculi: Secondary | ICD-10-CM | POA: Diagnosis not present

## 2012-10-29 DIAGNOSIS — E119 Type 2 diabetes mellitus without complications: Secondary | ICD-10-CM | POA: Insufficient documentation

## 2012-10-29 DIAGNOSIS — Y939 Activity, unspecified: Secondary | ICD-10-CM | POA: Insufficient documentation

## 2012-10-29 DIAGNOSIS — K219 Gastro-esophageal reflux disease without esophagitis: Secondary | ICD-10-CM | POA: Diagnosis not present

## 2012-10-29 NOTE — ED Notes (Signed)
PT. REPORTS DOG BITE AT HIS RIGHT LATERAL SHIN THIS EVENING AT A PARKING LOT BY AN UNKNOWN DOG, PRESENTS WITH 2 PUNCTURE MARKS AT LEFT LATERAL SHIN /BLEEDING CONTROLLED.

## 2012-10-30 ENCOUNTER — Emergency Department (HOSPITAL_COMMUNITY)
Admission: EM | Admit: 2012-10-30 | Discharge: 2012-10-30 | Disposition: A | Discharge status: Home / Self Care | Payer: Medicare Other | Attending: Emergency Medicine | Admitting: Emergency Medicine

## 2012-10-30 DIAGNOSIS — Z203 Contact with and (suspected) exposure to rabies: Secondary | ICD-10-CM

## 2012-10-30 DIAGNOSIS — S81851A Open bite, right lower leg, initial encounter: Secondary | ICD-10-CM

## 2012-10-30 MED ORDER — RABIES IMMUNE GLOBULIN 150 UNIT/ML IM INJ
20.0000 [IU]/kg | INJECTION | Freq: Once | INTRAMUSCULAR | Status: AC
  Administered 2012-10-30: 1125 [IU] via INTRAMUSCULAR
  Filled 2012-10-30: qty 8

## 2012-10-30 MED ORDER — AMOXICILLIN-POT CLAVULANATE 875-125 MG PO TABS: 1.0000 | ORAL_TABLET | Freq: Two times a day (BID) | ORAL | Status: AC

## 2012-10-30 MED ORDER — AMOXICILLIN-POT CLAVULANATE 875-125 MG PO TABS
1.0000 | ORAL_TABLET | Freq: Once | ORAL | Status: AC
  Administered 2012-10-30: 1 via ORAL
  Filled 2012-10-30: qty 1

## 2012-10-30 MED ORDER — TETANUS-DIPHTHERIA TOXOIDS TD 5-2 LFU IM INJ
0.5000 mL | INJECTION | Freq: Once | INTRAMUSCULAR | Status: AC
  Administered 2012-10-30: 0.5 mL via INTRAMUSCULAR
  Filled 2012-10-30: qty 0.5

## 2012-10-30 MED ORDER — RABIES VACCINE, PCEC IM SUSR
1.0000 mL | Freq: Once | INTRAMUSCULAR | Status: AC
  Administered 2012-10-30: 1 mL via INTRAMUSCULAR
  Filled 2012-10-30: qty 1

## 2012-10-30 NOTE — ED Provider Notes (Signed)
History     CSN: [id number]  Arrival date & time 10/29/12  2246   First MD Initiated Contact with Patient 10/30/12 0044      Chief Complaint  Patient presents with  . Animal Bite    (Consider location/radiation/quality/duration/timing/severity/associated sxs/prior treatment) HPI Comments: Patient states he was at a store when another client walking with her dog.  Dog got loose and bit him on the right anterior shin.  He is to puncture wounds, one, superficial and one deeper.  No active bleeding at this time.  Patient does not know when his last tetanus shot was, nor does he know the dog.  This has been reported to the police were investigating  Patient is a 77 y.o. male presenting with animal bite.  Animal Bite  The incident occurred just prior to arrival. The incident occurred at another residence. There is an injury to the left lower leg. The patient is experiencing no pain. It is unlikely that a foreign body is present. He has been behaving normally.    Past Medical History  Diagnosis Date  . Diabetes mellitus 01/2009 dx  . Hypertension   . DYSLIPIDEMIA   . GERD   . RENAL CALCULUS 01/04/2009    Past Surgical History  Procedure Laterality Date  . No past surgeries      Family History  Problem Relation Age of Onset  . Hypertension Mother     History  Substance Use Topics  . Smoking status: Current Every Day Smoker    Types: Cigarettes  . 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 Alcohol Use: No      Review of Systems  Constitutional: Negative for fever.  Skin: Positive for wound.  All other systems reviewed and are negative.    Allergies  Review of patient's allergies indicates no known allergies.  Home Medications   Current Outpatient Rx  Name  Route  Sig  Dispense  Refill  . albuterol (PROVENTIL HFA;VENTOLIN HFA) 108 (90 BASE) MCG/ACT inhaler   Inhalation   Inhale 2 puffs into the lungs every 6 (six)  hours as needed for wheezing.   1 Inhaler   0   . amoxicillin-clavulanate (AUGMENTIN) 875-125 MG per tablet   Oral   Take 1 tablet by mouth 2 (two) times daily.   13 tablet   0   . benazepril (LOTENSIN) 10 MG tablet   Oral   Take 1 tablet (10 mg total) by mouth daily.   30 tablet   11   . glucose blood (PRODIGY TEST) test strip   Other   1 each by Other route 2 (two) times daily. Use as instructed         . omeprazole (PRILOSEC) 20 MG capsule   Oral   Take 1 capsule (20 mg total) by mouth daily.   30 capsule   11   . ondansetron (ZOFRAN-ODT) 4 MG disintegrating tablet   Oral   Take 4 mg by mouth every 8 (eight) hours as needed. For nausea/vomiting.         . pioglitazone (ACTOS) 15 MG tablet   Oral   Take 1 tablet (15 mg total) by mouth daily.   30 tablet   11   . PRODIGY LANCETS 28G MISC   Does not apply   by Does not apply route 2 (two) times daily.         . traMADol (ULTRAM) 50  MG tablet   Oral   Take 1 tablet (50 mg total) by mouth every 6 (six) hours as needed for pain.   30 tablet   0     BP 145/107  Pulse 99  Temp(Src) 99.4 F (37.4 C) (Oral)  Resp 14  Wt 126 lb 15.8 oz (57.6 kg)  BMI 18.22 kg/m2  SpO2 97%  Physical Exam  Nursing note and vitals reviewed. Constitutional: He appears well-developed and well-nourished.  HENT:  Head: Normocephalic.  Eyes: Pupils are equal, round, and reactive to light.  Neck: Normal range of motion.  Cardiovascular: Normal rate.   Pulmonary/Chest: Effort normal and breath sounds normal.  Musculoskeletal: Normal range of motion. He exhibits no edema.       Right knee: He exhibits laceration. He exhibits normal range of motion and no swelling.       Legs: 2 puncture wounds  Skin: Skin is warm and dry. No erythema.    ED Course  Procedures (including critical care time)  Labs Reviewed - No data to display No results found.   1. Animal bite of lower leg, right, initial encounter   2. Rabies  exposure       MDM   Irrigate wounds with 50 cc NS start rabies prophylaxis update his tetanus        Arman Filter, NP 10/30/12 [phone]

## 2012-10-30 NOTE — ED Provider Notes (Signed)
Medical screening examination/treatment/procedure(s) were performed by non-physician practitioner and as supervising physician I was immediately available for consultation/collaboration.  Sunnie Nielsen, MD 10/30/12 [phone]

## 2012-11-11 ENCOUNTER — Encounter (HOSPITAL_COMMUNITY): Payer: Self-pay

## 2012-11-11 ENCOUNTER — Emergency Department (HOSPITAL_COMMUNITY)
Admission: EM | Admit: 2012-11-11 | Discharge: 2012-11-11 | Disposition: A | Discharge status: Home / Self Care | Payer: Medicare Other | Attending: Emergency Medicine | Admitting: Emergency Medicine

## 2012-11-11 ENCOUNTER — Emergency Department (HOSPITAL_COMMUNITY): Payer: Medicare Other

## 2012-11-11 DIAGNOSIS — I10 Essential (primary) hypertension: Secondary | ICD-10-CM | POA: Insufficient documentation

## 2012-11-11 DIAGNOSIS — E119 Type 2 diabetes mellitus without complications: Secondary | ICD-10-CM | POA: Insufficient documentation

## 2012-11-11 DIAGNOSIS — Z87442 Personal history of urinary calculi: Secondary | ICD-10-CM | POA: Diagnosis not present

## 2012-11-11 DIAGNOSIS — Z862 Personal history of diseases of the blood and blood-forming organs and certain disorders involving the immune mechanism: Secondary | ICD-10-CM | POA: Insufficient documentation

## 2012-11-11 DIAGNOSIS — M545 Low back pain: Secondary | ICD-10-CM | POA: Diagnosis not present

## 2012-11-11 DIAGNOSIS — F17200 Nicotine dependence, unspecified, uncomplicated: Secondary | ICD-10-CM | POA: Diagnosis not present

## 2012-11-11 DIAGNOSIS — M549 Dorsalgia, unspecified: Secondary | ICD-10-CM | POA: Insufficient documentation

## 2012-11-11 DIAGNOSIS — Z79899 Other long term (current) drug therapy: Secondary | ICD-10-CM | POA: Diagnosis not present

## 2012-11-11 DIAGNOSIS — N200 Calculus of kidney: Secondary | ICD-10-CM | POA: Diagnosis not present

## 2012-11-11 DIAGNOSIS — Z8639 Personal history of other endocrine, nutritional and metabolic disease: Secondary | ICD-10-CM | POA: Insufficient documentation

## 2012-11-11 DIAGNOSIS — M546 Pain in thoracic spine: Secondary | ICD-10-CM | POA: Diagnosis not present

## 2012-11-11 DIAGNOSIS — K219 Gastro-esophageal reflux disease without esophagitis: Secondary | ICD-10-CM | POA: Insufficient documentation

## 2012-11-11 HISTORY — DX: Calculus of kidney: N20.0

## 2012-11-11 LAB — URINALYSIS, ROUTINE W REFLEX MICROSCOPIC
Bilirubin Urine: NEGATIVE
Glucose, UA: NEGATIVE mg/dL
Ketones, ur: NEGATIVE mg/dL
Protein, ur: NEGATIVE mg/dL
pH: 5.5 (ref 5.0–8.0)

## 2012-11-11 MED ORDER — HYDROCODONE-ACETAMINOPHEN 5-325 MG PO TABS: 1.0000 | ORAL_TABLET | Freq: Four times a day (QID) | ORAL | Status: DC | PRN

## 2012-11-11 MED ORDER — HYDROCODONE-ACETAMINOPHEN 5-325 MG PO TABS
2.0000 | ORAL_TABLET | Freq: Once | ORAL | Status: AC
  Administered 2012-11-11: 2 via ORAL
  Filled 2012-11-11: qty 2

## 2012-11-11 NOTE — ED Provider Notes (Addendum)
History     CSN: [id number]  Arrival date & time 11/11/12  0904   First MD Initiated Contact with Patient 11/11/12 [phone]      Chief Complaint  Patient presents with  . Flank Pain    (Consider location/radiation/quality/duration/timing/severity/associated sxs/prior treatment) Patient is a 78 y.o. male presenting with flank pain. The history is provided by the patient.  Flank Pain Pertinent negatives include no chest pain, no abdominal pain, no headaches and no shortness of breath.  pt c/o right flank/back pain for past several days. Constant. Dull. Non radiating. Denies specific exacerbating or alleviating factors. Pt notes hx same pain. Notes hx kidney stones. Denies hematuria, urgency, or dysuria. No fever or chills. No anterior/abdominal pain. No nv. Denies back injury or strain. No radicular pain.     Past Medical History  Diagnosis Date  . Diabetes mellitus 01/2009 dx  . Hypertension   . DYSLIPIDEMIA   . GERD   . RENAL CALCULUS 01/04/2009  . Kidney stones     Past Surgical History  Procedure Laterality Date  . No past surgeries      Family History  Problem Relation Age of Onset  . Hypertension Mother     History  Substance Use Topics  . Smoking status: Current Every Day Smoker -- 0.50 packs/day    Types: Cigarettes  . 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 Alcohol Use: No      Review of Systems  Constitutional: Negative for fever.  HENT: Negative for neck pain.   Eyes: Negative for redness.  Respiratory: Negative for shortness of breath.   Cardiovascular: Negative for chest pain.  Gastrointestinal: Negative for abdominal pain.  Genitourinary: Positive for flank pain.  Musculoskeletal: Negative for back pain.  Skin: Negative for rash.  Neurological: Negative for headaches.  Hematological: Does not bruise/bleed easily.  Psychiatric/Behavioral: Negative for confusion.    Allergies  Review of patient's  allergies indicates no known allergies.  Home Medications   Current Outpatient Rx  Name  Route  Sig  Dispense  Refill  . albuterol (PROVENTIL HFA;VENTOLIN HFA) 108 (90 BASE) MCG/ACT inhaler   Inhalation   Inhale 2 puffs into the lungs every 6 (six) hours as needed for wheezing.   1 Inhaler   0   . benazepril (LOTENSIN) 10 MG tablet   Oral   Take 1 tablet (10 mg total) by mouth daily.   30 tablet   11   . glucose blood (PRODIGY TEST) test strip   Other   1 each by Other route 2 (two) times daily. Use as instructed         . omeprazole (PRILOSEC) 20 MG capsule   Oral   Take 1 capsule (20 mg total) by mouth daily.   30 capsule   11   . ondansetron (ZOFRAN-ODT) 4 MG disintegrating tablet   Oral   Take 4 mg by mouth every 8 (eight) hours as needed. For nausea/vomiting.         . pioglitazone (ACTOS) 15 MG tablet   Oral   Take 1 tablet (15 mg total) by mouth daily.   30 tablet   11   . PRODIGY LANCETS 28G MISC   Does not apply   by Does not apply route 2 (two) times daily.         . traMADol (ULTRAM) 50 MG tablet   Oral   Take 1 tablet (50 mg total)  by mouth every 6 (six) hours as needed for pain.   30 tablet   0     BP 133/88  Pulse 71  Temp(Src) 98.1 F (36.7 C) (Oral)  Resp 16  Ht 5\' 7"  (1.702 m)  Wt 120 lb 8 oz (54.658 kg)  BMI 18.87 kg/m2  SpO2 100%  Physical Exam  Nursing note and vitals reviewed. Constitutional: He is oriented to person, place, and time. He appears well-developed and well-nourished. No distress.  HENT:  Head: Atraumatic.  Eyes: Conjunctivae are normal.  Neck: Neck supple. No tracheal deviation present.  Cardiovascular: Normal rate, regular rhythm, normal heart sounds and intact distal pulses.   Pulmonary/Chest: Effort normal and breath sounds normal. No accessory muscle usage. No respiratory distress.  Abdominal: Soft. Bowel sounds are normal. He exhibits no distension and no mass. There is no tenderness. There is no rebound  and no guarding.  Genitourinary:  No cva tenderness  Musculoskeletal: Normal range of motion. He exhibits no edema and no tenderness.  tls spine non tender, aligned. No focal sts or focal bony tenderness on exam back/flank.  Neurological: He is alert and oriented to person, place, and time.  Steady gait.   Skin: Skin is warm and dry.  Psychiatric: He has a normal mood and affect.    ED Course  Procedures (including critical care time)  Ct Abdomen Pelvis Wo Contrast  11/11/2012   *RADIOLOGY REPORT*  Clinical Data: Right flank and back pain.  CT ABDOMEN AND PELVIS WITHOUT CONTRAST  Technique:  Multidetector CT imaging of the abdomen and pelvis was performed following the standard protocol without intravenous contrast.  Comparison: 11/16/2011  Findings: Linear scarring in the lung bases bilaterally.  No acute opacities or effusions.  Heart is normal size.  Right kidney is atrophic.  Stable bilateral renal stones.  No hydronephrosis.  No ureteral stones.  Urinary bladder is unremarkable.  Prostate is mildly prominent.  Multiple calcified phleboliths in the anatomic pelvis.  Focal 3 cm infrarenal abdominal aortic aneurysm, minimally enlarged since prior study (2.8 cm).  Large and small bowel are grossly unremarkable.  No free fluid, free air or adenopathy.  No acute bony abnormality.  IMPRESSION: Bilateral nephrolithiasis.  No ureteral stones or hydronephrosis.  3.0 cm infrarenal abdominal aortic aneurysm. Recommend followup by [place] in 3 years.  This recommendation follows ACR consensus guidelines: White Paper of the ACR Incidental Findings Committee II on Vascular Findings.  J Am Coll Radiol 2013; 10:789-794.  Mild prostate enlargement.   Original Report Authenticated By: Charlett Nose, M.D.       MDM  Pt c/o right flank pain posteriorly, hx kidney stones.  Ct.  Reviewed nursing notes and prior charts for additional history.   Ct reviewed, small AAA unchanged from prior.  No ureteral or  obstructing stones.  Recheck pt comfortable. Stable for d/c.   Pt appears to have musculoskeletal cp, worse w bending at waist/turning torso in ed, mild lumbar and thoracic paraspinal muscular tenderness not localized to one specific level.   Pt states has ride, did not drive. No meds pta. vicodin po.         Suzi Roots, MD 11/11/12 1004  Suzi Roots, MD 11/11/12 [phone]

## 2012-11-11 NOTE — ED Notes (Signed)
Patient c/o bilateral flank pain  R>L. Patient reports a history of kidney stones. Patiaent reports slight pain when urinating.

## 2012-11-11 NOTE — ED Notes (Signed)
Patient states he has been dealing with this pain for the past couple of months but could not handle the pain any more.

## 2013-02-14 ENCOUNTER — Other Ambulatory Visit: Payer: Self-pay | Admitting: Internal Medicine

## 2013-03-20 ENCOUNTER — Emergency Department (INDEPENDENT_AMBULATORY_CARE_PROVIDER_SITE_OTHER)
Admission: EM | Admit: 2013-03-20 | Discharge: 2013-03-20 | Disposition: A | Discharge status: Home / Self Care | Payer: Medicare Other | Source: Home / Self Care

## 2013-03-20 ENCOUNTER — Encounter (HOSPITAL_COMMUNITY): Payer: Self-pay | Admitting: Emergency Medicine

## 2013-03-20 DIAGNOSIS — K2940 Chronic atrophic gastritis without bleeding: Secondary | ICD-10-CM | POA: Diagnosis not present

## 2013-03-20 DIAGNOSIS — K2950 Unspecified chronic gastritis without bleeding: Secondary | ICD-10-CM

## 2013-03-20 DIAGNOSIS — K0889 Other specified disorders of teeth and supporting structures: Secondary | ICD-10-CM

## 2013-03-20 DIAGNOSIS — K089 Disorder of teeth and supporting structures, unspecified: Secondary | ICD-10-CM | POA: Diagnosis not present

## 2013-03-20 LAB — POCT URINALYSIS DIP (DEVICE)
Ketones, ur: NEGATIVE mg/dL
Protein, ur: 30 mg/dL — AB
Specific Gravity, Urine: 1.01 (ref 1.005–1.030)
Urobilinogen, UA: 0.2 mg/dL (ref 0.0–1.0)
pH: 5.5 (ref 5.0–8.0)

## 2013-03-20 MED ORDER — AMOXICILLIN 500 MG PO CAPS: 500.0000 mg | ORAL_CAPSULE | Freq: Three times a day (TID) | ORAL | Status: DC

## 2013-03-20 MED ORDER — RANITIDINE HCL 150 MG PO TABS: 150.0000 mg | ORAL_TABLET | Freq: Two times a day (BID) | ORAL | Status: DC

## 2013-03-20 MED ORDER — TRAMADOL HCL 50 MG PO TABS: 50.0000 mg | ORAL_TABLET | Freq: Four times a day (QID) | ORAL | Status: DC | PRN

## 2013-03-20 NOTE — ED Provider Notes (Signed)
CSN: [id number]     Arrival date & time 03/20/13  1415 History   None    Chief Complaint  Patient presents with  . Dental Pain  . Flank Pain    x 1 month.    (Consider location/radiation/quality/duration/timing/severity/associated sxs/prior Treatment) Patient is a 78 y.o. male presenting with tooth pain and flank pain. The history is provided by the patient.  Dental Pain Location:  Lower Lower teeth location:  27/RL cuspid Quality:  Throbbing Severity:  Mild Duration:  1 month Progression:  Worsening Chronicity:  New Context: poor dentition   Context comment:  Believes tooth is causing right side to hurt, also c/o  xs stomach mucous Associated symptoms: no fever   Flank Pain    Past Medical History  Diagnosis Date  . Diabetes mellitus 01/2009 dx  . Hypertension   . DYSLIPIDEMIA   . GERD   . RENAL CALCULUS 01/04/2009  . Kidney stones    Past Surgical History  Procedure Laterality Date  . No past surgeries     Family History  Problem Relation Age of Onset  . Hypertension Mother    History  Substance Use Topics  . Smoking status: Current Every Day Smoker -- 0.50 packs/day    Types: Cigarettes  . 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 Alcohol Use: No    Review of Systems  Constitutional: Negative.  Negative for fever.  HENT: Positive for dental problem.   Gastrointestinal: Negative for nausea, vomiting and diarrhea.  Genitourinary: Positive for flank pain. Negative for dysuria and urgency.    Allergies  Review of patient's allergies indicates no known allergies.  Home Medications   Current Outpatient Rx  Name  Route  Sig  Dispense  Refill  . amoxicillin (AMOXIL) 500 MG capsule   Oral   Take 1 capsule (500 mg total) by mouth 3 (three) times daily.   30 capsule   0   . HYDROcodone-acetaminophen (NORCO/VICODIN) 5-325 MG per tablet   Oral   Take 1-2 tablets by mouth every 6 (six) hours as needed for  pain.   20 tablet   0   . pioglitazone (ACTOS) 15 MG tablet   Oral   Take 1 tablet (15 mg total) by mouth daily.   30 tablet   11   . pioglitazone (ACTOS) 30 MG tablet      TAKE 1 TABLET (30 MG TOTAL) BY MOUTH DAILY.   30 tablet   3     Yearly follow-up due in November must see md b4 ad ...   . ranitidine (ZANTAC) 150 MG tablet   Oral   Take 1 tablet (150 mg total) by mouth 2 (two) times daily. For stomach   60 tablet   0   . traMADol (ULTRAM) 50 MG tablet   Oral   Take 1 tablet (50 mg total) by mouth every 6 (six) hours as needed for pain.   15 tablet   0    BP 121/86  Pulse 92  Temp(Src) 97.5 F (36.4 C) (Oral)  SpO2 96% Physical Exam  Nursing note and vitals reviewed. Constitutional: He is oriented to person, place, and time. He appears well-developed and well-nourished.  HENT:  Head: Normocephalic.  Right Ear: External ear normal.  Left Ear: External ear normal.  Mouth/Throat: Oropharynx is clear and moist.    Neck: Normal range of motion. Neck supple.  Pulmonary/Chest: Effort normal and breath sounds  normal. He exhibits tenderness.  Tender palpable end of 12th rib on right   Lymphadenopathy:    He has no cervical adenopathy.  Neurological: He is alert and oriented to person, place, and time.  Skin: Skin is warm and dry.    ED Course  Procedures (including critical care time) Labs Review Labs Reviewed  POCT URINALYSIS DIP (DEVICE) - Abnormal; Notable for the following:    Hgb urine dipstick TRACE (*)    Protein, ur 30 (*)    All other components within normal limits   Imaging Review No results found.  MDM     Linna Hoff, MD 03/20/13 [phone]

## 2013-03-20 NOTE — ED Notes (Signed)
C/o dental pain. Right lower tooth x 1 month. Tooth is decayed.  Also c/o of right flank pain x 1 month gradually getting worse. Hx of kidney stones  Pt has not used any otc meds for symptoms

## 2013-06-08 IMAGING — CR DG CHEST 2V
2 series · 2 of 2 positions shown · non-contrast
Comparison: 11/14/2011; 05/10/2009; 11/03/2008

CLINICAL DATA: History of smoking and hypertension, now with
chronic right lower rib pain

CHEST - 2 VIEW

[view not recorded (1 of 2)]
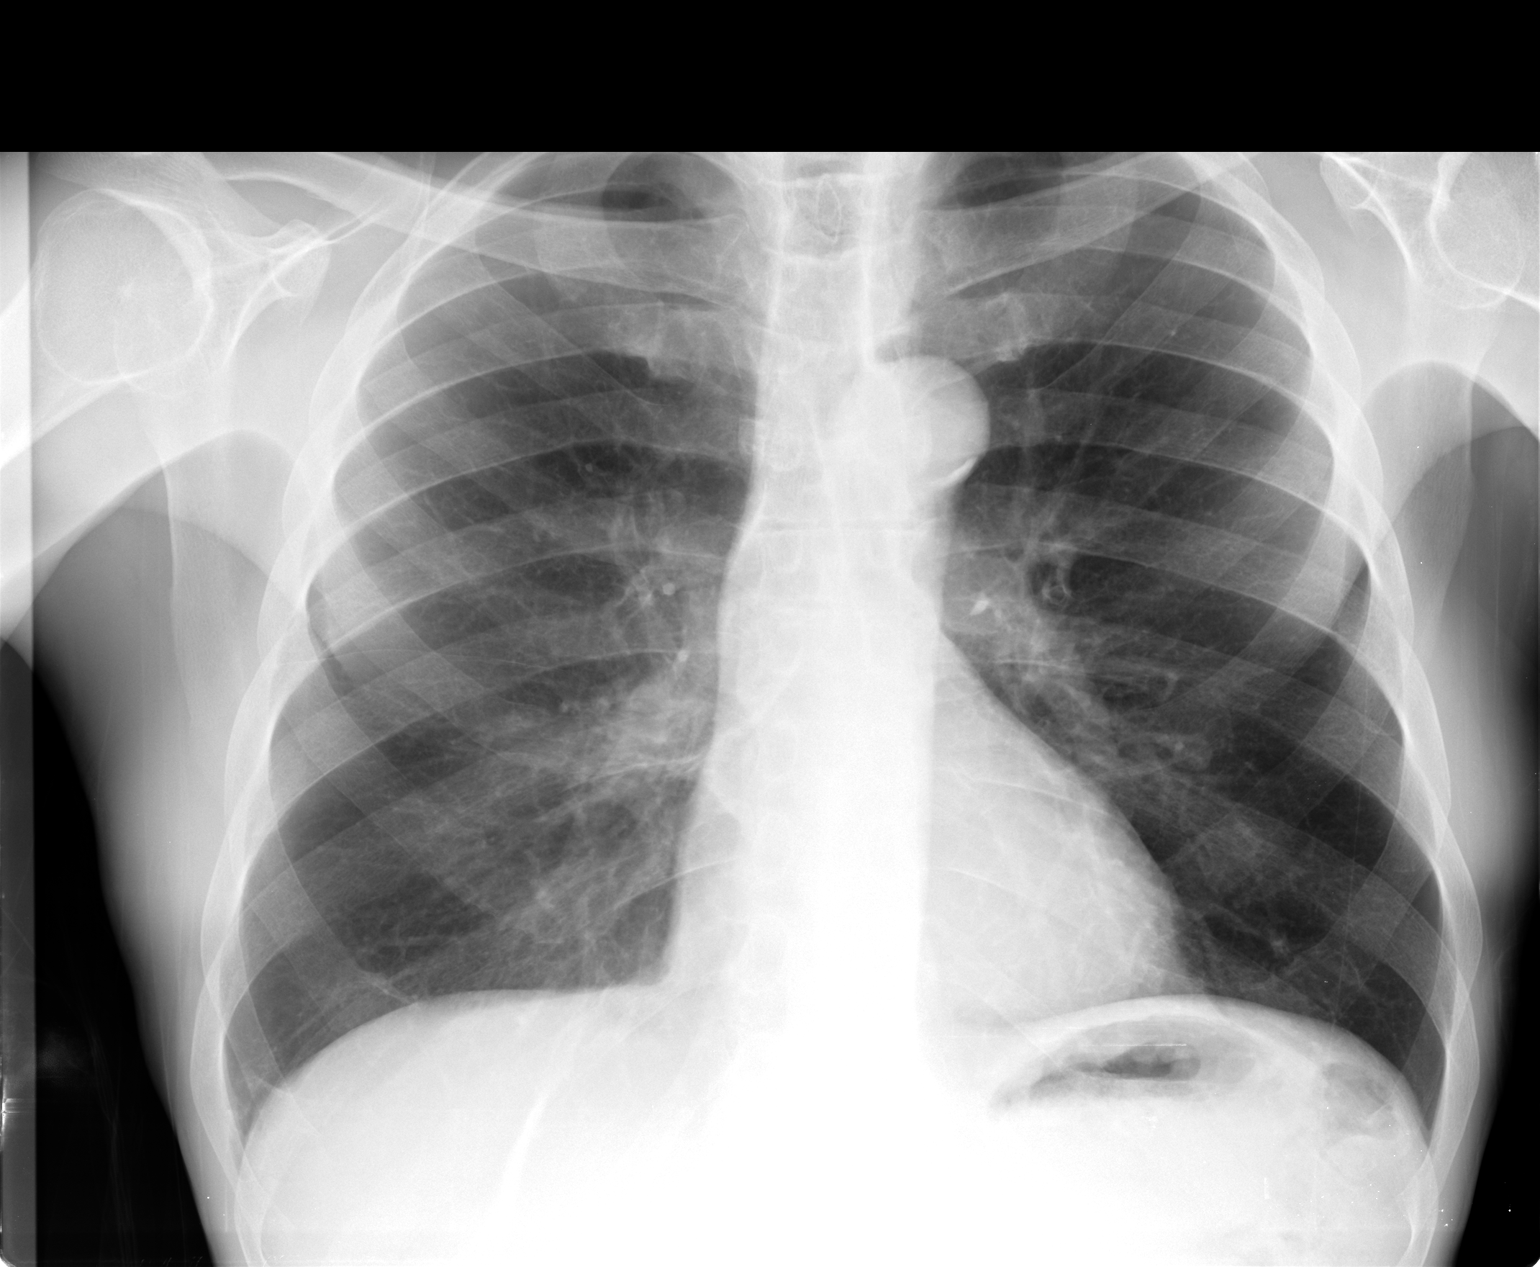

[view not recorded (2 of 2)]
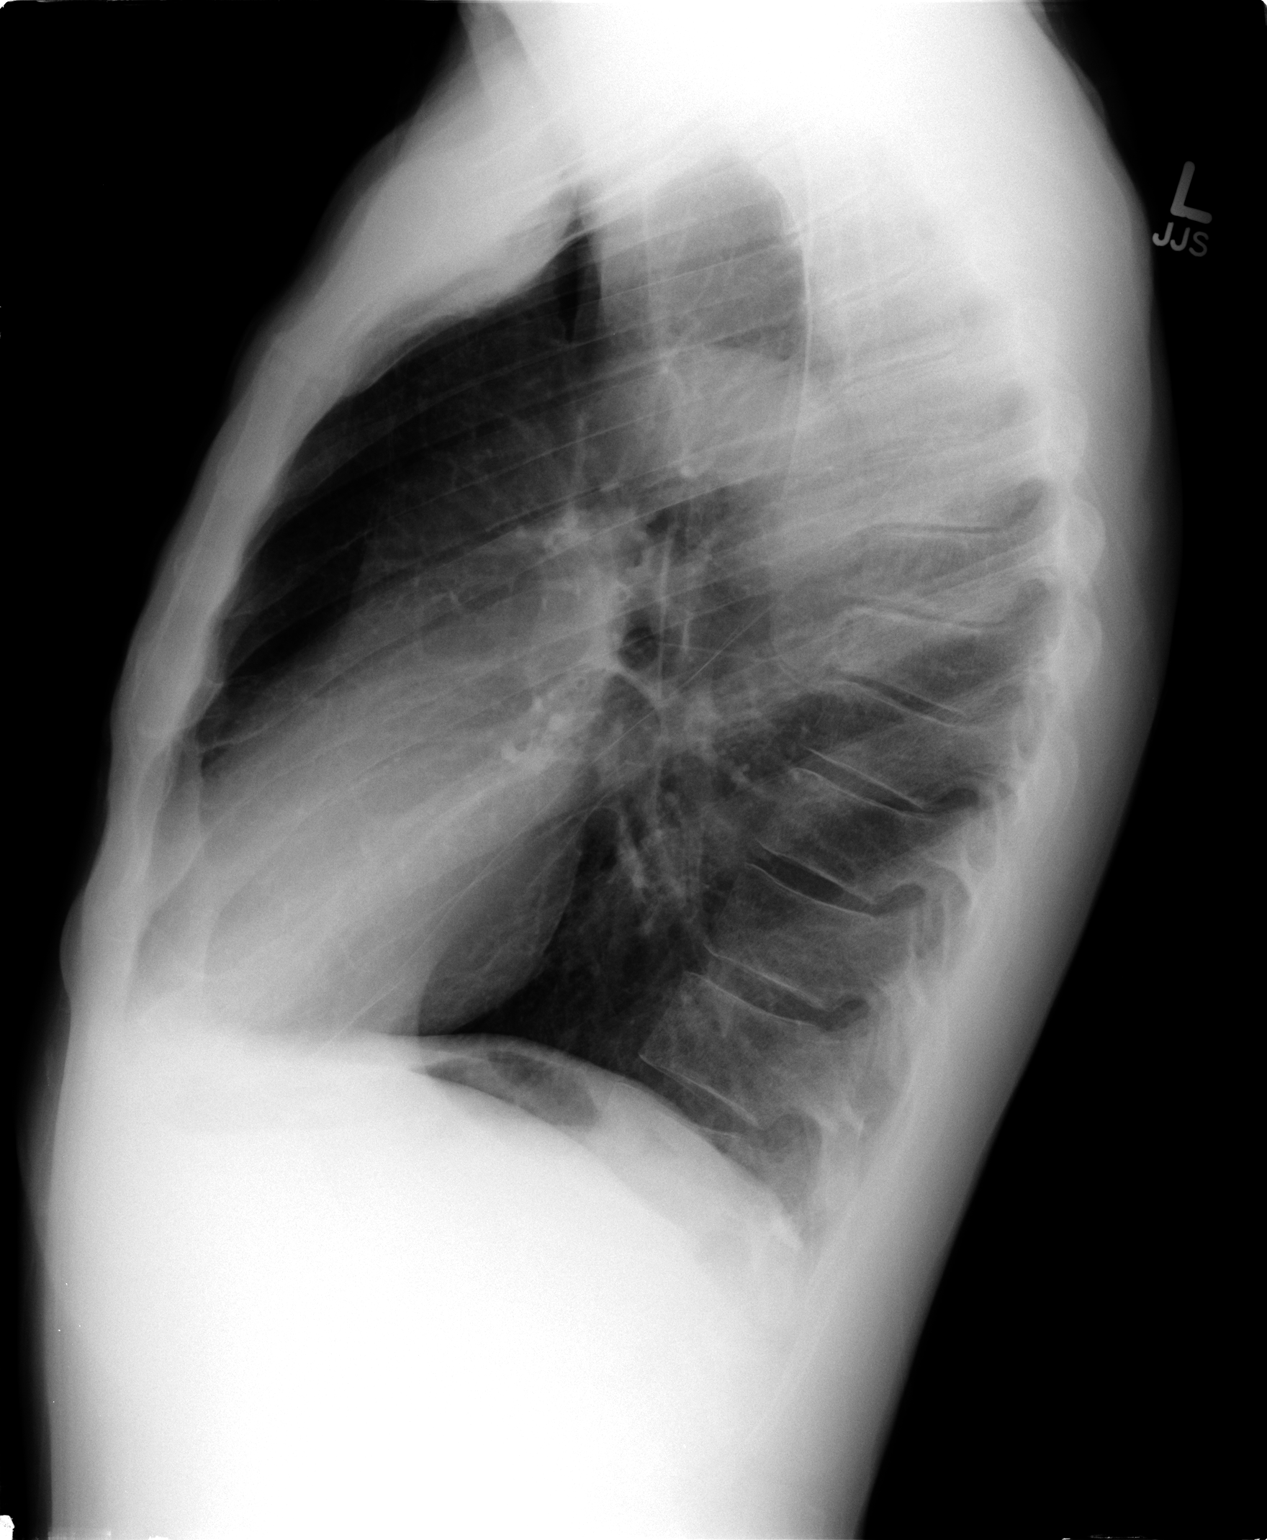

[2 of 2 positions shown; findings below may reference images not displayed]

FINDINGS: Grossly unchanged cardiac silhouette and mediastinal contours with
atherosclerotic calcifications within the aortic arch.  Lungs
remain hyperinflated with flattening of the bilateral
hemidiaphragms.  No focal parenchymal opacities.  No pleural
effusion or pneumothorax.  No acute osseous abnormalities,
specifically, no displaced right-sided rib fractures.
IMPRESSION: Hyperexpanded lungs without acute cardiopulmonary disease.

## 2013-08-10 ENCOUNTER — Other Ambulatory Visit: Payer: Self-pay | Admitting: Internal Medicine

## 2013-08-18 ENCOUNTER — Other Ambulatory Visit: Payer: Self-pay | Admitting: Internal Medicine

## 2014-03-02 ENCOUNTER — Encounter: Payer: Self-pay | Admitting: Internal Medicine

## 2016-04-24 ENCOUNTER — Encounter (HOSPITAL_COMMUNITY): Payer: Self-pay | Admitting: *Deleted

## 2016-04-24 ENCOUNTER — Ambulatory Visit (HOSPITAL_COMMUNITY)
Admission: EM | Admit: 2016-04-24 | Discharge: 2016-04-24 | Disposition: A | Discharge status: Home / Self Care | Payer: Medicare Other | Attending: Family Medicine | Admitting: Family Medicine

## 2016-04-24 DIAGNOSIS — H1013 Acute atopic conjunctivitis, bilateral: Secondary | ICD-10-CM

## 2016-04-24 MED ORDER — TOBRAMYCIN-DEXAMETHASONE 0.3-0.05 % OP SUSP: 1.0000 [drp] | Freq: Three times a day (TID) | OPHTHALMIC | 0 refills | Status: AC
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
## 2016-04-24 NOTE — ED Triage Notes (Signed)
Pt  Reports      Symptoms        Of    Eye  Irritation        With   Redness     And  Drainage    With     Symptoms       X  4-5  Days

## 2017-07-18 ENCOUNTER — Encounter (HOSPITAL_COMMUNITY): Payer: Self-pay | Admitting: Family Medicine

## 2017-07-18 ENCOUNTER — Ambulatory Visit (INDEPENDENT_AMBULATORY_CARE_PROVIDER_SITE_OTHER): Payer: Self-pay

## 2017-07-18 ENCOUNTER — Ambulatory Visit (HOSPITAL_COMMUNITY)
Admission: EM | Admit: 2017-07-18 | Discharge: 2017-07-18 | Disposition: A | Discharge status: Home / Self Care | Payer: Self-pay | Attending: Physician Assistant | Admitting: Physician Assistant

## 2017-07-18 DIAGNOSIS — N200 Calculus of kidney: Secondary | ICD-10-CM

## 2017-07-18 DIAGNOSIS — M542 Cervicalgia: Secondary | ICD-10-CM

## 2017-07-18 DIAGNOSIS — M545 Low back pain: Secondary | ICD-10-CM

## 2017-07-18 DIAGNOSIS — M546 Pain in thoracic spine: Secondary | ICD-10-CM

## 2017-07-18 MED ORDER — CYCLOBENZAPRINE HCL 5 MG PO TABS: 2.5000 mg | ORAL_TABLET | Freq: Three times a day (TID) | ORAL | 0 refills | Status: DC | PRN
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
## 2017-07-18 NOTE — Discharge Instructions (Signed)
Please take 1000 mg of Tylenol every 8 hours as needed for pain.  Add in one half tab of Flexeril every 6-8 hours.  Please use with caution as this medication can cause mild dizziness.  Please do not mix Flexeril with narcotics, benzodiazepines, or any illicit drugs.

## 2019-07-29 ENCOUNTER — Ambulatory Visit (INDEPENDENT_AMBULATORY_CARE_PROVIDER_SITE_OTHER): Payer: Medicare Other

## 2019-07-29 ENCOUNTER — Ambulatory Visit (HOSPITAL_COMMUNITY)
Admission: EM | Admit: 2019-07-29 | Discharge: 2019-07-29 | Disposition: A | Discharge status: Home / Self Care | Payer: Medicare Other | Attending: Urgent Care | Admitting: Urgent Care

## 2019-07-29 ENCOUNTER — Encounter (HOSPITAL_COMMUNITY): Payer: Self-pay | Admitting: Emergency Medicine

## 2019-07-29 ENCOUNTER — Other Ambulatory Visit: Payer: Self-pay

## 2019-07-29 DIAGNOSIS — N200 Calculus of kidney: Secondary | ICD-10-CM | POA: Insufficient documentation

## 2019-07-29 DIAGNOSIS — R109 Unspecified abdominal pain: Secondary | ICD-10-CM | POA: Insufficient documentation

## 2019-07-29 DIAGNOSIS — M545 Low back pain: Secondary | ICD-10-CM | POA: Insufficient documentation

## 2019-07-29 DIAGNOSIS — R3916 Straining to void: Secondary | ICD-10-CM | POA: Insufficient documentation

## 2019-07-29 DIAGNOSIS — M5136 Other intervertebral disc degeneration, lumbar region: Secondary | ICD-10-CM | POA: Insufficient documentation

## 2019-07-29 DIAGNOSIS — G8929 Other chronic pain: Secondary | ICD-10-CM | POA: Diagnosis present

## 2019-07-29 DIAGNOSIS — R300 Dysuria: Secondary | ICD-10-CM | POA: Diagnosis present

## 2019-07-29 LAB — POCT URINALYSIS DIP (DEVICE)
Bilirubin Urine: NEGATIVE
Glucose, UA: NEGATIVE mg/dL
Hgb urine dipstick: NEGATIVE
Ketones, ur: NEGATIVE mg/dL
Nitrite: NEGATIVE
Protein, ur: 30 mg/dL — AB
Specific Gravity, Urine: 1.015 (ref 1.005–1.030)
Urobilinogen, UA: 0.2 mg/dL (ref 0.0–1.0)
pH: 5.5 (ref 5.0–8.0)

## 2019-07-29 MED ORDER — CEPHALEXIN 500 MG PO CAPS: 500.0000 mg | ORAL_CAPSULE | Freq: Two times a day (BID) | ORAL | 0 refills | Status: DC

## 2019-07-29 MED ORDER — TRAMADOL HCL 50 MG PO TABS: 50.0000 mg | ORAL_TABLET | Freq: Three times a day (TID) | ORAL | 0 refills | Status: DC | PRN

## 2019-07-29 MED ORDER — TIZANIDINE HCL 4 MG PO TABS: 4.0000 mg | ORAL_TABLET | Freq: Every evening | ORAL | 0 refills | Status: DC | PRN

## 2019-07-29 MED ORDER — TAMSULOSIN HCL 0.4 MG PO CAPS: 0.4000 mg | ORAL_CAPSULE | Freq: Every day | ORAL | 0 refills | Status: DC

## 2019-07-29 NOTE — Discharge Instructions (Addendum)
Please make sure that you are taking Tylenol for your back pains, flank pains.  The right dosing for you is 500mg -650 mg once every 6 hours as needed.  If you have severe pain it is okay to use tramadol.  This is a narcotic medication and therefore take it only if Tylenol is not helping you with your pain.  We can help you with a steroid course if your back pain continues thereafter due to your arthritis.  Lastly, due to the fact that you are having painful urination I am going to use an antibiotic to help you with urinary tract infection.  This is Keflex and I want to make sure you take this twice a day with food.  We are running a urine culture to see what kind of bacteria is causing your problem if any.  We will let you know if we need to change your antibiotic based off of the urine culture results.

## 2019-07-29 NOTE — ED Triage Notes (Signed)
PT reports history of kidney stones. He had some "removed" at [facility] 18-19 years ago  Flank pain and back pain, present for over a week.

## 2019-07-29 NOTE — ED Provider Notes (Signed)
[place]   MRN: [id number] DOB: 1934/07/28  Subjective:   Vincent Baxter is a 85 y.o. male presenting for 1 week history of recurrent bilateral low back pain/flank pain.  States that he has had intermittent mild dysuria and difficulty with straining to urinate.  Patient has a remote history of kidney stone about 15 to 20 years ago.  He did have an x-ray of the low back in 2019 and was found to have multilevel degenerative disc disease of the lumbar region and an 8 mm renal stone of the right side.  Patient believes he had his prostate checked but cannot recall when.  He does not have an urologist currently.  No current facility-administered medications for this encounter.  Current Outpatient Medications:  .  cyclobenzaprine (FLEXERIL) 5 MG tablet, Take 0.5-1 tablets (2.5-5 mg total) by mouth 3 (three) times daily as needed for muscle spasms. Use lowest effective dose., Disp: 30 tablet, Rfl: 0 .  pioglitazone (ACTOS) 15 MG tablet, Take 1 tablet (15 mg total) by mouth daily., Disp: 30 tablet, Rfl: 11 .  pioglitazone (ACTOS) 30 MG tablet, TAKE 1 TABLET (30 MG TOTAL) BY MOUTH DAILY., Disp: 30 tablet, Rfl: 3 .  ranitidine (ZANTAC) 150 MG tablet, Take 1 tablet (150 mg total) by mouth 2 (two) times daily. For stomach, Disp: 60 tablet, Rfl: 0   No Known Allergies  Past Medical History:  Diagnosis Date  . Diabetes mellitus 01/2009 dx  . DYSLIPIDEMIA   . GERD   . Hypertension   . Kidney stones   . RENAL CALCULUS 01/04/2009     Past Surgical History:  Procedure Laterality Date  . NO PAST SURGERIES      Family History  Problem Relation Age of Onset  . Hypertension Mother     Social History   Tobacco Use  . Smoking status: Current Every Day Smoker    Packs/day: 0.50    Types: Cigarettes  . Smokeless tobacco: Never Used  . 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 Alcohol use: No  . Drug use: No    ROS   Objective:    Vitals: BP (!) 154/91   Pulse (!) 102   Temp 98.3 F (36.8 C) (Oral)   Resp 18   SpO2 100%   Wt Readings from Last 3 Encounters:  11/11/12 120 lb 8 oz (54.7 kg)  10/30/12 126 lb 15.8 oz (57.6 kg)  05/12/12 127 lb (57.6 kg)   Temp Readings from Last 3 Encounters:  07/29/19 98.3 F (36.8 C) (Oral)  07/18/17 98.5 F (36.9 C)  04/24/16 97.5 F (36.4 C) (Oral)   BP Readings from Last 3 Encounters:  07/29/19 (!) 154/91  07/18/17 (!) 169/95  04/24/16 143/92   Pulse Readings from Last 3 Encounters:  07/29/19 (!) 102  07/18/17 83  04/24/16 91     Physical Exam Constitutional:      General: He is not in acute distress.    Appearance: Normal appearance. He is well-developed. He is not ill-appearing, toxic-appearing or diaphoretic.  HENT:     Head: Normocephalic and atraumatic.     Right Ear: External ear normal.     Left Ear: External ear normal.     Nose: Nose normal.     Mouth/Throat:     Mouth: Mucous membranes are moist.     Pharynx: Oropharynx is clear.  Eyes:     General: No scleral icterus.  Extraocular Movements: Extraocular movements intact.     Pupils: Pupils are equal, round, and reactive to light.  Cardiovascular:     Rate and Rhythm: Normal rate and regular rhythm.     Heart sounds: Normal heart sounds. No murmur. No friction rub. No gallop.   Pulmonary:     Effort: Pulmonary effort is normal. No respiratory distress.     Breath sounds: Normal breath sounds. No stridor. No wheezing, rhonchi or rales.  Abdominal:     General: Bowel sounds are normal. There is no distension.     Palpations: Abdomen is soft. There is no mass.     Tenderness: There is no abdominal tenderness. There is no right CVA tenderness, left CVA tenderness, guarding or rebound.  Musculoskeletal:     Lumbar back: Tenderness present. No edema, lacerations, spasms or bony tenderness. Normal range of motion. Negative right straight leg raise test and negative left straight leg raise  test.       Back:  Skin:    General: Skin is warm and dry.  Neurological:     Mental Status: He is alert and oriented to person, place, and time.     Deep Tendon Reflexes: Reflexes normal.  Psychiatric:        Mood and Affect: Mood normal.        Behavior: Behavior normal.        Thought Content: Thought content normal.        Judgment: Judgment normal.     DG Abd 1 View  Result Date: 07/29/2019 CLINICAL DATA:  Abdominal and flank pain, concern for kidney stones. EXAM: ABDOMEN - 1 VIEW COMPARISON:  Abdominal radiograph 01/17/2009, CT abdomen pelvis 11/11/2012 FINDINGS: Nonobstructive bowel gas pattern. No supine evidence for free air. Stable appearance of a 7 mm calcific density projecting over the lower pole the right kidney. No definite left renal calculi. Scattered densities in the pelvis likely represent vascular phleboliths. No acute finding in the visualized skeleton. The lung bases are clear. IMPRESSION: Stable appearance of a 7 mm stone in the inferior right kidney. No definite left renal calculi. Electronically Signed   By: Emmaline Kluver M.D.   On: 07/29/2019 15:01     Results for orders placed or performed during the hospital encounter of 07/29/19 (from the past 24 hour(s))  POCT urinalysis dip (device)     Status: Abnormal   Collection Time: 07/29/19  2:20 PM  Result Value Ref Range   Glucose, UA NEGATIVE NEGATIVE mg/dL   Bilirubin Urine NEGATIVE NEGATIVE   Ketones, ur NEGATIVE NEGATIVE mg/dL   Specific Gravity, Urine 1.015 1.005 - 1.030   Hgb urine dipstick NEGATIVE NEGATIVE   pH 5.5 5.0 - 8.0   Protein, ur 30 (A) NEGATIVE mg/dL   Urobilinogen, UA 0.2 0.0 - 1.0 mg/dL   Nitrite NEGATIVE NEGATIVE   Leukocytes,Ua TRACE (A) NEGATIVE    Assessment and Plan :   1. Dysuria   2. Right flank pain   3. Left flank pain   4. Chronic bilateral low back pain without sciatica   5. Lumbar degenerative disc disease   6. Urinary straining   7. Renal stone     Patient  has multiple possible etiologies.  Counseled that we will address possible cystitis with Keflex, urine culture pending.  He does have stable renal stone of right inferior kidney and therefore warrants a consult with urology.  Recommended to use Flomax and hydrate well.  Also discussed possibility of having BPH given urinary  straining.  Counseled that he could have this evaluated further with the urologist.  Start Flomax until then. In the meantime patient will address his chronic back pain in the setting of lumbar degenerative disc disease with scheduling Tylenol, Zanaflex as needed and using tramadol for breakthrough pain. Counseled patient on potential for adverse effects with medications prescribed/recommended today, ER and return-to-clinic precautions discussed, patient verbalized understanding.    Wallis Bamberg, PA-C 07/29/19 1724

## 2019-07-30 LAB — URINE CULTURE: Culture: <10000 — AB

## 2020-05-01 ENCOUNTER — Emergency Department (HOSPITAL_COMMUNITY): Payer: Medicare Other

## 2020-05-01 ENCOUNTER — Encounter (HOSPITAL_COMMUNITY): Payer: Self-pay | Admitting: *Deleted

## 2020-05-01 ENCOUNTER — Other Ambulatory Visit: Payer: Self-pay

## 2020-05-01 ENCOUNTER — Ambulatory Visit (HOSPITAL_COMMUNITY)
Admission: EM | Admit: 2020-05-01 | Discharge: 2020-05-01 | Disposition: A | Discharge status: Other Acute Inpatient Hospital | Payer: Medicare Other | Attending: Emergency Medicine | Admitting: Emergency Medicine

## 2020-05-01 ENCOUNTER — Ambulatory Visit (INDEPENDENT_AMBULATORY_CARE_PROVIDER_SITE_OTHER): Payer: Medicare Other

## 2020-05-01 ENCOUNTER — Inpatient Hospital Stay (HOSPITAL_COMMUNITY)
Admission: EM | Admit: 2020-05-01 | Discharge: 2020-05-04 | DRG: 291 | Disposition: A | Discharge status: Home / Self Care | Payer: Medicare Other | Source: Ambulatory Visit | Attending: Internal Medicine | Admitting: Internal Medicine

## 2020-05-01 ENCOUNTER — Encounter (HOSPITAL_COMMUNITY): Payer: Self-pay | Admitting: Emergency Medicine

## 2020-05-01 DIAGNOSIS — I248 Other forms of acute ischemic heart disease: Secondary | ICD-10-CM | POA: Diagnosis present

## 2020-05-01 DIAGNOSIS — Z79899 Other long term (current) drug therapy: Secondary | ICD-10-CM

## 2020-05-01 DIAGNOSIS — M549 Dorsalgia, unspecified: Secondary | ICD-10-CM

## 2020-05-01 DIAGNOSIS — Z8711 Personal history of peptic ulcer disease: Secondary | ICD-10-CM | POA: Diagnosis not present

## 2020-05-01 DIAGNOSIS — Z7901 Long term (current) use of anticoagulants: Secondary | ICD-10-CM

## 2020-05-01 DIAGNOSIS — I491 Atrial premature depolarization: Secondary | ICD-10-CM

## 2020-05-01 DIAGNOSIS — N1831 Chronic kidney disease, stage 3a: Secondary | ICD-10-CM | POA: Diagnosis present

## 2020-05-01 DIAGNOSIS — E1122 Type 2 diabetes mellitus with diabetic chronic kidney disease: Secondary | ICD-10-CM | POA: Diagnosis present

## 2020-05-01 DIAGNOSIS — I5033 Acute on chronic diastolic (congestive) heart failure: Secondary | ICD-10-CM | POA: Diagnosis present

## 2020-05-01 DIAGNOSIS — R109 Unspecified abdominal pain: Secondary | ICD-10-CM | POA: Diagnosis not present

## 2020-05-01 DIAGNOSIS — E785 Hyperlipidemia, unspecified: Secondary | ICD-10-CM | POA: Diagnosis present

## 2020-05-01 DIAGNOSIS — R0781 Pleurodynia: Secondary | ICD-10-CM | POA: Diagnosis not present

## 2020-05-01 DIAGNOSIS — I10 Essential (primary) hypertension: Secondary | ICD-10-CM | POA: Diagnosis not present

## 2020-05-01 DIAGNOSIS — I472 Ventricular tachycardia: Secondary | ICD-10-CM | POA: Diagnosis not present

## 2020-05-01 DIAGNOSIS — F17200 Nicotine dependence, unspecified, uncomplicated: Secondary | ICD-10-CM

## 2020-05-01 DIAGNOSIS — N200 Calculus of kidney: Secondary | ICD-10-CM | POA: Diagnosis present

## 2020-05-01 DIAGNOSIS — R9431 Abnormal electrocardiogram [ECG] [EKG]: Secondary | ICD-10-CM

## 2020-05-01 DIAGNOSIS — I714 Abdominal aortic aneurysm, without rupture, unspecified: Secondary | ICD-10-CM | POA: Diagnosis present

## 2020-05-01 DIAGNOSIS — Z720 Tobacco use: Secondary | ICD-10-CM

## 2020-05-01 DIAGNOSIS — I509 Heart failure, unspecified: Secondary | ICD-10-CM | POA: Diagnosis not present

## 2020-05-01 DIAGNOSIS — R030 Elevated blood-pressure reading, without diagnosis of hypertension: Secondary | ICD-10-CM | POA: Diagnosis not present

## 2020-05-01 DIAGNOSIS — I493 Ventricular premature depolarization: Secondary | ICD-10-CM | POA: Diagnosis not present

## 2020-05-01 DIAGNOSIS — R7989 Other specified abnormal findings of blood chemistry: Secondary | ICD-10-CM | POA: Diagnosis present

## 2020-05-01 DIAGNOSIS — Z20822 Contact with and (suspected) exposure to covid-19: Secondary | ICD-10-CM | POA: Diagnosis present

## 2020-05-01 DIAGNOSIS — Z8249 Family history of ischemic heart disease and other diseases of the circulatory system: Secondary | ICD-10-CM

## 2020-05-01 DIAGNOSIS — F17210 Nicotine dependence, cigarettes, uncomplicated: Secondary | ICD-10-CM | POA: Diagnosis present

## 2020-05-01 DIAGNOSIS — R778 Other specified abnormalities of plasma proteins: Secondary | ICD-10-CM | POA: Diagnosis present

## 2020-05-01 DIAGNOSIS — I441 Atrioventricular block, second degree: Secondary | ICD-10-CM | POA: Diagnosis present

## 2020-05-01 DIAGNOSIS — J439 Emphysema, unspecified: Secondary | ICD-10-CM | POA: Diagnosis present

## 2020-05-01 DIAGNOSIS — R0602 Shortness of breath: Secondary | ICD-10-CM | POA: Diagnosis present

## 2020-05-01 DIAGNOSIS — K219 Gastro-esophageal reflux disease without esophagitis: Secondary | ICD-10-CM | POA: Diagnosis present

## 2020-05-01 DIAGNOSIS — E119 Type 2 diabetes mellitus without complications: Secondary | ICD-10-CM

## 2020-05-01 DIAGNOSIS — J189 Pneumonia, unspecified organism: Secondary | ICD-10-CM

## 2020-05-01 DIAGNOSIS — K029 Dental caries, unspecified: Secondary | ICD-10-CM | POA: Diagnosis present

## 2020-05-01 DIAGNOSIS — R0600 Dyspnea, unspecified: Secondary | ICD-10-CM | POA: Diagnosis not present

## 2020-05-01 DIAGNOSIS — I340 Nonrheumatic mitral (valve) insufficiency: Secondary | ICD-10-CM | POA: Diagnosis not present

## 2020-05-01 DIAGNOSIS — I130 Hypertensive heart and chronic kidney disease with heart failure and stage 1 through stage 4 chronic kidney disease, or unspecified chronic kidney disease: Principal | ICD-10-CM | POA: Diagnosis present

## 2020-05-01 DIAGNOSIS — N183 Chronic kidney disease, stage 3 unspecified: Secondary | ICD-10-CM | POA: Diagnosis not present

## 2020-05-01 DIAGNOSIS — IMO0001 Reserved for inherently not codable concepts without codable children: Secondary | ICD-10-CM

## 2020-05-01 LAB — TROPONIN I (HIGH SENSITIVITY)
Troponin I (High Sensitivity): 306 ng/L (ref <18)
Troponin I (High Sensitivity): 303 ng/L (ref <18)

## 2020-05-01 LAB — HEPATIC FUNCTION PANEL
ALT: 92 U/L — ABNORMAL HIGH (ref 0–44)
AST: 27 U/L (ref 15–41)
Albumin: 3.4 g/dL — ABNORMAL LOW (ref 3.5–5.0)
Alkaline Phosphatase: 104 U/L (ref 38–126)
Bilirubin, Direct: 0.3 mg/dL — ABNORMAL HIGH (ref 0.0–0.2)
Indirect Bilirubin: 0.9 mg/dL (ref 0.3–0.9)
Total Bilirubin: 1.2 mg/dL (ref 0.3–1.2)
Total Protein: 6.3 g/dL — ABNORMAL LOW (ref 6.5–8.1)

## 2020-05-01 LAB — POCT URINALYSIS DIPSTICK, ED / UC
Bilirubin Urine: NEGATIVE
Glucose, UA: NEGATIVE mg/dL
Ketones, ur: NEGATIVE mg/dL
Leukocytes,Ua: NEGATIVE
Nitrite: NEGATIVE
Protein, ur: 100 mg/dL — AB
Specific Gravity, Urine: 1.02 (ref 1.005–1.030)
Urobilinogen, UA: 1 mg/dL (ref 0.0–1.0)
pH: 5.5 (ref 5.0–8.0)

## 2020-05-01 LAB — CBC
HCT: 36.9 % — ABNORMAL LOW (ref 39.0–52.0)
Hemoglobin: 11.9 g/dL — ABNORMAL LOW (ref 13.0–17.0)
MCH: 32.5 pg (ref 26.0–34.0)
MCHC: 32.2 g/dL (ref 30.0–36.0)
MCV: 100.8 fL — ABNORMAL HIGH (ref 80.0–100.0)
Platelets: 181 10*3/uL (ref 150–400)
RBC: 3.66 MIL/uL — ABNORMAL LOW (ref 4.22–5.81)
RDW: 14.2 % (ref 11.5–15.5)
WBC: 15 10*3/uL — ABNORMAL HIGH (ref 4.0–10.5)
nRBC: 0 % (ref 0.0–0.2)

## 2020-05-01 LAB — RESPIRATORY PANEL BY RT PCR (FLU A&B, COVID)
Influenza A by PCR: NEGATIVE
Influenza B by PCR: NEGATIVE
SARS Coronavirus 2 by RT PCR: NEGATIVE

## 2020-05-01 LAB — BASIC METABOLIC PANEL
Anion gap: 12 (ref 5–15)
BUN: 20 mg/dL (ref 8–23)
CO2: 20 mmol/L — ABNORMAL LOW (ref 22–32)
Calcium: 9.1 mg/dL (ref 8.9–10.3)
Chloride: 111 mmol/L (ref 98–111)
Creatinine, Ser: 1.47 mg/dL — ABNORMAL HIGH (ref 0.61–1.24)
GFR, Estimated: 47 mL/min — ABNORMAL LOW (ref >60)
Glucose, Bld: 182 mg/dL — ABNORMAL HIGH (ref 70–99)
Potassium: 4.3 mmol/L (ref 3.5–5.1)
Sodium: 143 mmol/L (ref 135–145)

## 2020-05-01 LAB — TSH: TSH: 0.845 u[IU]/mL (ref 0.350–4.500)

## 2020-05-01 LAB — BRAIN NATRIURETIC PEPTIDE: B Natriuretic Peptide: 916.5 pg/mL — ABNORMAL HIGH (ref 0.0–100.0)

## 2020-05-01 LAB — LACTIC ACID, PLASMA: Lactic Acid, Venous: 2.2 mmol/L (ref 0.5–1.9)

## 2020-05-01 LAB — D-DIMER, QUANTITATIVE: D-Dimer, Quant: 2.03 ug/mL-FEU — ABNORMAL HIGH (ref 0.00–0.50)

## 2020-05-01 MED ORDER — FUROSEMIDE 10 MG/ML IJ SOLN: 40.0000 mg | Freq: Once | INTRAMUSCULAR | Status: DC

## 2020-05-01 MED ORDER — ACETAMINOPHEN 325 MG PO TABS
650.0000 mg | ORAL_TABLET | ORAL | Status: DC | PRN
  Administered 2020-05-02 – 2020-05-03 (×3): 650 mg via ORAL
  Filled 2020-05-01 (×3): qty 2

## 2020-05-01 MED ORDER — SODIUM CHLORIDE 0.9 % IV SOLN
500.0000 mg | Freq: Once | INTRAVENOUS | Status: AC
  Administered 2020-05-02: 500 mg via INTRAVENOUS
  Filled 2020-05-01: qty 500

## 2020-05-01 MED ORDER — FUROSEMIDE 10 MG/ML IJ SOLN
40.0000 mg | Freq: Two times a day (BID) | INTRAMUSCULAR | Status: DC
  Administered 2020-05-02 – 2020-05-03 (×3): 40 mg via INTRAVENOUS
  Filled 2020-05-01 (×4): qty 4

## 2020-05-01 MED ORDER — SODIUM CHLORIDE 0.9 % IV SOLN
1.0000 g | Freq: Once | INTRAVENOUS | Status: AC
  Administered 2020-05-01: 1 g via INTRAVENOUS
  Filled 2020-05-01: qty 10

## 2020-05-01 MED ORDER — ONDANSETRON HCL 4 MG/2ML IJ SOLN: 4.0000 mg | Freq: Four times a day (QID) | INTRAMUSCULAR | Status: DC | PRN

## 2020-05-01 MED ORDER — FUROSEMIDE 20 MG PO TABS: 20.0000 mg | ORAL_TABLET | Freq: Every day | ORAL | 0 refills | Status: DC

## 2020-05-01 MED ORDER — SODIUM CHLORIDE 0.9% FLUSH: 3.0000 mL | INTRAVENOUS | Status: DC | PRN

## 2020-05-01 MED ORDER — SODIUM CHLORIDE 0.9% FLUSH
3.0000 mL | Freq: Two times a day (BID) | INTRAVENOUS | Status: DC
  Administered 2020-05-02 – 2020-05-03 (×5): 3 mL via INTRAVENOUS

## 2020-05-01 MED ORDER — IOHEXOL 350 MG/ML SOLN
80.0000 mL | Freq: Once | INTRAVENOUS | Status: AC | PRN
  Administered 2020-05-01: 80 mL via INTRAVENOUS

## 2020-05-01 MED ORDER — ENOXAPARIN SODIUM 40 MG/0.4ML ~~LOC~~ SOLN
40.0000 mg | SUBCUTANEOUS | Status: DC
  Administered 2020-05-02: 40 mg via SUBCUTANEOUS
  Filled 2020-05-01: qty 0.4

## 2020-05-01 MED ORDER — SODIUM CHLORIDE 0.9 % IV SOLN: 250.0000 mL | INTRAVENOUS | Status: DC | PRN

## 2020-05-01 NOTE — ED Triage Notes (Signed)
Pt c/o intermittent chronic pain to right distal posterior ribcage stemming from injury "years ago".  C/O flare-up of pain with painful breathing and feeling SOB. Also reports bilat ankle edema x 3-4 days.

## 2020-05-01 NOTE — ED Notes (Signed)
Pt. Transported to CT 

## 2020-05-01 NOTE — ED Provider Notes (Signed)
[facility] EMERGENCY DEPARTMENT Provider Note   CSN: [id number] Arrival date & time: 05/01/20  1437     History Chief Complaint  Patient presents with  . Cough  . Shortness of Breath    Vincent Baxter is a 85 y.o. male.  The history is provided by the patient.  Shortness of Breath Severity:  Moderate Onset quality:  Gradual Timing:  Constant Progression:  Worsening Chronicity:  New Context: activity   Relieved by:  Nothing Worsened by:  Deep breathing Associated symptoms: chest pain and cough   Associated symptoms: no abdominal pain, no ear pain, no fever, no rash, no sore throat, no sputum production and no vomiting   Risk factors: no hx of PE/DVT        Past Medical History:  Diagnosis Date  . Diabetes mellitus 01/2009 dx  . DYSLIPIDEMIA   . GERD   . Hypertension   . Kidney stones   . RENAL CALCULUS 01/04/2009    Patient Active Problem List   Diagnosis Date Noted  . AAA (abdominal aortic aneurysm) (HCC)   . 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 ANOREXIA 02/23/2009  . DYSLIPIDEMIA 01/18/2009  . DIABETES MELLITUS, TYPE II, UNCONTROLLED 01/10/2009  . GERD 01/10/2009  . HYPERTENSION 01/04/2009  . RENAL CALCULUS 01/04/2009    Past Surgical History:  Procedure Laterality Date  . NO PAST SURGERIES         Family History  Problem Relation Age of Onset  . Hypertension Mother     Social History   Tobacco Use  . Smoking status: Current Every Day Smoker    Packs/day: 0.50    Types: Cigarettes  . Smokeless tobacco: Never Used  . 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 Vaping Use: Never used  Substance Use Topics  . Alcohol use: No  . Drug use: No    Home Medications Prior to Admission medications   Medication Sig Start Date End Date Taking? Authorizing Provider  cephALEXin (KEFLEX) 500 MG capsule Take 1 capsule (500 mg total) by mouth 2 (two) times  daily. 07/29/19   Wallis Bamberg, PA-C  cyclobenzaprine (FLEXERIL) 5 MG tablet Take 0.5-1 tablets (2.5-5 mg total) by mouth 3 (three) times daily as needed for muscle spasms. Use lowest effective dose. 07/18/17   Ofilia Neas, PA-C  pioglitazone (ACTOS) 15 MG tablet Take 1 tablet (15 mg total) by mouth daily. 05/12/12   Newt Lukes, MD  pioglitazone (ACTOS) 30 MG tablet TAKE 1 TABLET (30 MG TOTAL) BY MOUTH DAILY. 02/14/13   Newt Lukes, MD  ranitidine (ZANTAC) 150 MG tablet Take 1 tablet (150 mg total) by mouth 2 (two) times daily. For stomach 03/20/13   Linna Hoff, MD  tamsulosin (FLOMAX) 0.4 MG CAPS capsule Take 1 capsule (0.4 mg total) by mouth daily after supper. 07/29/19   Wallis Bamberg, PA-C  tiZANidine (ZANAFLEX) 4 MG tablet Take 1 tablet (4 mg total) by mouth at bedtime as needed for muscle spasms. 07/29/19   Wallis Bamberg, PA-C  traMADol (ULTRAM) 50 MG tablet Take 1 tablet (50 mg total) by mouth every 8 (eight) hours as needed for severe pain. 07/29/19   Wallis Bamberg, PA-C  furosemide (LASIX) 20 MG tablet Take 1 tablet (20 mg total) by mouth daily for 3 days. 05/01/20 05/01/20  Mickie Bail, NP    Allergies    Patient has no known allergies.  Review of Systems   Review of Systems  Constitutional: Negative for chills and fever.  HENT: Negative for ear pain and sore throat.   Eyes: Negative for pain and visual disturbance.  Respiratory: Positive for cough and shortness of breath. Negative for sputum production.   Cardiovascular: Positive for chest pain. Negative for palpitations.  Gastrointestinal: Negative for abdominal pain and vomiting.  Genitourinary: Negative for dysuria and hematuria.  Musculoskeletal: Negative for arthralgias and back pain.  Skin: Negative for color change and rash.  Neurological: Negative for seizures and syncope.  All other systems reviewed and are negative.   Physical Exam Updated Vital Signs  ED Triage Vitals  Enc Vitals Group     BP  05/01/20 1444 (!) 176/119     Pulse Rate 05/01/20 1444 (!) 105     Resp 05/01/20 1444 16     Temp 05/01/20 1444 98.6 F (37 C)     Temp Source 05/01/20 1444 Oral     SpO2 05/01/20 1444 95 %     Weight --      Height --      Head Circumference --      Peak Flow --      Pain Score 05/01/20 1441 10     Pain Loc --      Pain Edu? --      Excl. in GC? --     Physical Exam Vitals and nursing note reviewed.  Constitutional:      General: He is not in acute distress.    Appearance: He is well-developed. He is not ill-appearing.  HENT:     Head: Normocephalic and atraumatic.  Eyes:     Conjunctiva/sclera: Conjunctivae normal.  Cardiovascular:     Rate and Rhythm: Normal rate and regular rhythm.     Pulses: Normal pulses.     Heart sounds: Normal heart sounds. No murmur heard.   Pulmonary:     Effort: Pulmonary effort is normal. No respiratory distress.     Breath sounds: Decreased breath sounds present.  Abdominal:     Palpations: Abdomen is soft.     Tenderness: There is no abdominal tenderness.  Musculoskeletal:     Cervical back: Neck supple.     Right lower leg: No edema.     Left lower leg: No edema.  Skin:    General: Skin is warm and dry.     Capillary Refill: Capillary refill takes less than 2 seconds.  Neurological:     General: No focal deficit present.     Mental Status: He is alert.     ED Results / Procedures / Treatments   Labs (all labs ordered are listed, but only abnormal results are displayed) Labs Reviewed  BASIC METABOLIC PANEL - Abnormal; Notable for the following components:      Result Value   CO2 20 (*)    Glucose, Bld 182 (*)    Creatinine, Ser 1.47 (*)    GFR, Estimated 47 (*)    All other components within normal limits  CBC - Abnormal; Notable for the following components:   WBC 15.0 (*)    RBC 3.66 (*)    Hemoglobin 11.9 (*)    HCT 36.9 (*)    MCV 100.8 (*)    All other components within normal limits  BRAIN NATRIURETIC PEPTIDE -  Abnormal; Notable for the following components:   B Natriuretic Peptide 916.5 (*)    All other components within normal limits  TROPONIN I (HIGH  SENSITIVITY) - Abnormal; Notable for the following components:   Troponin I (High Sensitivity) 306 (*)    All other components within normal limits  TROPONIN I (HIGH SENSITIVITY) - Abnormal; Notable for the following components:   Troponin I (High Sensitivity) 303 (*)    All other components within normal limits  RESPIRATORY PANEL BY RT PCR (FLU A&B, COVID)  CULTURE, BLOOD (ROUTINE X 2)  CULTURE, BLOOD (ROUTINE X 2)  RAPID URINE DRUG SCREEN, HOSP PERFORMED  D-DIMER, QUANTITATIVE (NOT AT [facility])  HEPATIC FUNCTION PANEL  LACTIC ACID, PLASMA  LACTIC ACID, PLASMA  TSH    EKG EKG Interpretation  Date/Time:  Sunday May 01 2020 14:39:51 EST Ventricular Rate:  100 PR Interval:  146 QRS Duration: 68 QT Interval:  352 QTC Calculation: 454 R Axis:   54 Text Interpretation: Normal sinus rhythm Anterior infarct , age undetermined Abnormal ECG Confirmed by Virgina NorfolkAdam, Quamel Fitzmaurice [phone]([id number]) on 05/01/2020 4:39:06 PM   Radiology DG Ribs Unilateral W/Chest Right  Result Date: 05/01/2020 CLINICAL DATA:  Rib pain and shortness of breath. EXAM: RIGHT RIBS AND CHEST - 3+ VIEW COMPARISON:  Chest x-ray on 11/14/2011 FINDINGS: Lungs demonstrate diffuse bilateral perihilar edema likely consistent with congestive heart failure. There are associated small bilateral pleural effusions. No airspace consolidation or pneumothorax. The heart size is within normal limits. Right ribs demonstrate no evidence of fracture or bony lesion. IMPRESSION: Findings likely representing congestive heart failure with small bilateral pleural effusions. Electronically Signed   By: Irish LackGlenn  Yamagata M.D.   On: 05/01/2020 13:44    Procedures .Critical Care Performed by: Virgina Norfolkuratolo, Tysin, DO Authorized by: Virgina Norfolkuratolo, Timouthy, DO   Critical care provider statement:    Critical care time (minutes):   45   Critical care was necessary to treat or prevent imminent or life-threatening deterioration of the following conditions:  Cardiac failure   Critical care was time spent personally by me on the following activities:  Blood draw for specimens, discussions with consultants, discussions with primary provider, evaluation of patient's response to treatment, examination of patient, obtaining history from patient or surrogate, ordering and performing treatments and interventions, ordering and review of laboratory studies, ordering and review of radiographic studies, pulse oximetry, review of old charts and re-evaluation of patient's condition   I assumed direction of critical care for this patient from another provider in my specialty: no     (including critical care time)  Medications Ordered in ED Medications  furosemide (LASIX) injection 40 mg (has no administration in time range)    ED Course  I have reviewed the triage vital signs and the nursing notes.  Pertinent labs & imaging results that were available during my care of the patient were reviewed by me and considered in my medical decision making (see chart for details).    MDM Rules/Calculators/A&P                          Jene Everydam J Steadham is an 85 year old male with history of diabetes, high cholesterol, hypertension who presents to the ED with chest pain, shortness of breath. Ongoing for the last several days. Had x-ray done at urgent care that showed bilateral pleural effusions consistent with likely congestive heart failure. Patient is hypertensive but otherwise vitals unremarkable. No signs of obvious volume overload on exam. Patient is very thin. Overall well-appearing. Has had lab work well waiting for room and shows troponin of 300. Mild leukocytosis of 15. Otherwise creatinine 1.47. He appears comfortable  and does not have any active chest pain. My suspicion is he might have heart failure from chronic uncontrolled hypertension. He  does not take any medications currently despite medical problems. No history of heart failure. EKG showed no ischemic changes. Overall will trend troponin and get a BNP.  Troponin stable at 300.  BNP elevated to 1000.  Talked to cardiology and we will expand work-up to include dissection study.  Possibly infectious process versus cardiac process versus possible PE.  Possibly dissection.  Does have a history of AAA.  Overall he is not complaining of severe abdominal pain or chest pain.  He has had some infectious type symptoms.  But does not have a fever.  Overall we will continue work-up with CT imaging, blood cultures, lactic acid.  Patient remains comfortable and chest pain-free.  Will hold on heparin at this time.  Patient signed out to my PA while patient is awaiting remaining work-up.  We will have her touch base with cardiology afterwards to get their opinion but anticipate admission to medicine following work-up.  This chart was dictated using voice recognition software.  Despite best efforts to proofread,  errors can occur which can change the documentation meaning.    Final Clinical Impression(s) / ED Diagnoses Final diagnoses:  Elevated troponin  SOB (shortness of breath)    Rx / DC Orders ED Discharge Orders    None       Kevis, Qu, DO 05/01/20 2110

## 2020-05-01 NOTE — ED Triage Notes (Addendum)
Pt to triage via [facility] from [facility].  Reports bilateral leg weakness and pain, swelling to ankles, R sided back pain, SOB, and coughing white "milky mucous" x 5-6 days.

## 2020-05-01 NOTE — ED Notes (Signed)
[facility] called for transport to ED for CHF and hypertension.

## 2020-05-01 NOTE — Consult Note (Signed)
CARDIOLOGY CONSULT NOTE  Patient ID: Vincent Baxter MRN: [id number] DOB/AGE: 1934/07/01 85 y.o.  Admit date: 05/01/2020 Attending physician: Virgina Norfolk, DO Primary Physician:  Patient, No Pcp Per Outpatient Cardiologist: None Inpatient Cardiologist: Tessa Lerner, DO, [facility]  Chief complaint: Shortness of breath, cough, right-sided rib pain Reason of consultation: Elevated troponin  HPI:  Vincent Baxter is a 85 y.o. African-American male who presents with a chief complaint of " shortness of breath, cough, right-sided rib pain." His past medical history and cardiovascular risk factors include: Diagnosed with diabetes mellitus not on medical therapy, documented history of hyperlipidemia but not on medical therapy, hypertension, peptic ulcer disease, active smoker, advanced age.  Patient is not the best historian and presents to the hospital after being evaluated in urgent care for back pain and lower extremity swelling.  Patient states that he has been having right-sided rib pain for the last 2 or 3 days, it hurts when he takes a deep breath, has been experiencing shortness of breath for the last several days along with a productive cough.  He states that the last couple days he has been experiencing subjective fevers does not have a thermometer to check it objectively.  He denies body aches/chills.  Patient states that he has been vaccinated against COVID-19.  Cardiology was consulted given elevated troponin levels.  Currently denies any chest pain or anginal equivalent.  He is also noticed ankle swelling bilaterally which is a new finding according to him.  ALLERGIES: No Known Allergies  PAST MEDICAL HISTORY: Past Medical History:  Diagnosis Date  . Diabetes mellitus 01/2009 dx  . DYSLIPIDEMIA   . GERD   . Hypertension   . Kidney stones   . RENAL CALCULUS 01/04/2009  History of peptic ulcer disease  PAST SURGICAL HISTORY: Past Surgical History:  Procedure Laterality Date  . NO  PAST SURGERIES    No known surgeries per patient.  FAMILY HISTORY: The patient family history includes Hypertension in his mother.  No family history of premature CAD   SOCIAL HISTORY:  The patient  reports that he has been smoking cigarettes. He has been smoking about 0.50 packs per day. He has never used smokeless tobacco. He reports that he does not drink alcohol and does not use drugs.  MEDICATIONS: Current Outpatient Medications  Medication Instructions  . cephALEXin (KEFLEX) 500 mg, Oral, 2 times daily  . cyclobenzaprine (FLEXERIL) 2.5-5 mg, Oral, 3 times daily PRN, Use lowest effective dose.  . pioglitazone (ACTOS) 30 MG tablet TAKE 1 TABLET (30 MG TOTAL) BY MOUTH DAILY.  Marland Kitchen pioglitazone (ACTOS) 15 mg, Oral, Daily  . ranitidine (ZANTAC) 150 mg, Oral, 2 times daily, For stomach  . tamsulosin (FLOMAX) 0.4 mg, Oral, Daily after supper  . tiZANidine (ZANAFLEX) 4 mg, Oral, At bedtime PRN  . traMADol (ULTRAM) 50 mg, Oral, Every 8 hours PRN   Review of Systems  Constitutional: Positive for fever (Subjective). Negative for chills.  HENT: Positive for congestion. Negative for hoarse voice and nosebleeds.   Eyes: Negative for discharge, double vision and pain.  Cardiovascular: Positive for leg swelling. Negative for chest pain, claudication, dyspnea on exertion, near-syncope, orthopnea, palpitations, paroxysmal nocturnal dyspnea and syncope.  Respiratory: Positive for cough and shortness of breath. Negative for hemoptysis.   Endocrine: Positive for cold intolerance.  Musculoskeletal: Negative for muscle cramps and myalgias.  Gastrointestinal: Negative for abdominal pain, constipation, diarrhea, hematemesis, hematochezia, melena, nausea and vomiting.  Neurological: Negative for dizziness and light-headedness.  All other systems reviewed  and are negative.  PHYSICAL EXAM: Vitals with BMI 05/01/2020 05/01/2020 05/01/2020  Height - - -  Weight - - -  BMI - - -  Systolic 165 149 161    Diastolic 113 100 94  Pulse 80 90 84    No intake or output data in the 24 hours ending 05/01/20 2034  Net IO Since Admission: No IO data has been entered for this period [05/01/20 2034]  CONSTITUTIONAL: Appears older than stated age, hemodynamically stable, no acute distress resting in bed comfortably.   SKIN: Skin is warm and dry. No rash noted. No cyanosis. No pallor. No jaundice HEAD: Normocephalic and atraumatic.  EYES: No scleral icterus MOUTH/THROAT: Moist oral membranes.  NECK: No JVD present. No thyromegaly noted. No carotid bruits  LYMPHATIC: No visible cervical adenopathy.  CHEST Normal respiratory effort. No intercostal retractions  LUNGS: Decreased breath sounds bilaterally.  No stridor. No wheezes.  Mild rales.  CARDIOVASCULAR: Tachycardia, positive S1-S2, no murmurs rubs or gallops appreciated secondary to tachycardia. ABDOMINAL: Nonobese, soft, non-tender, nondistended, positive bowel sounds all 4 quadrants, no apparent ascites.  EXTREMITIES: Bilateral ankle swelling. HEMATOLOGIC: No significant bruising NEUROLOGIC: Oriented to person, place, and time. Nonfocal. Normal muscle tone.  PSYCHIATRIC: Normal mood and affect. Normal behavior. Cooperative  RADIOLOGY: DG Ribs Unilateral W/Chest Right  Result Date: 05/01/2020 CLINICAL DATA:  Rib pain and shortness of breath. EXAM: RIGHT RIBS AND CHEST - 3+ VIEW COMPARISON:  Chest x-ray on 11/14/2011 FINDINGS: Lungs demonstrate diffuse bilateral perihilar edema likely consistent with congestive heart failure. There are associated small bilateral pleural effusions. No airspace consolidation or pneumothorax. The heart size is within normal limits. Right ribs demonstrate no evidence of fracture or bony lesion. IMPRESSION: Findings likely representing congestive heart failure with small bilateral pleural effusions. Electronically Signed   By: Irish Lack M.D.   On: 05/01/2020 13:44    LABORATORY DATA: Lab Results  Component  Value Date   WBC 15.0 (H) 05/01/2020   HGB 11.9 (L) 05/01/2020   HCT 36.9 (L) 05/01/2020   MCV 100.8 (H) 05/01/2020   PLT 181 05/01/2020    Recent Labs  Lab 05/01/20 1455  NA 143  K 4.3  CL 111  CO2 20*  BUN 20  CREATININE 1.47*  CALCIUM 9.1  GLUCOSE 182*    Lipid Panel     Component Value Date/Time   CHOL 151 02/11/2012 0931   TRIG 106.0 02/11/2012 0931   HDL 58.50 02/11/2012 0931   CHOLHDL 3 02/11/2012 0931   VLDL 21.2 02/11/2012 0931   LDLCALC 71 02/11/2012 0931    BNP (last 3 results) Recent Labs    05/01/20 1836  BNP 916.5*    HEMOGLOBIN A1C Lab Results  Component Value Date   HGBA1C 6.5 05/12/2012    Cardiac Panel (last 3 results) Recent Labs    05/01/20 1455 05/01/20 1835  TROPONINIHS 306* 303*    TSH No results for input(s): TSH in the last 8760 hours.    CARDIAC DATABASE: EKG: 05/01/2020: Sinus tachycardia, 100 bpm, normal axis, poor R wave progression, consider old anterior infarct, without underlying injury pattern.  Echocardiogram: Ordered  IMPRESSION & RECOMMENDATIONS: OBERT ESPINDOLA is a 85 y.o. African-American male whose past medical history and cardiovascular risk factors include: Diagnosed with diabetes mellitus not on medical therapy, documented history of hyperlipidemia but not on medical therapy, hypertension, peptic ulcer disease, active smoker, advanced age.  Elevated troponins:  Patient denies any active chest pain and EKG does not show underlying  ischemia or injury pattern.  I suspect that the elevated troponin levels are most likely secondary to supply demand ischemia given the underlying tachycardia and hypertensive urgency on presentation.  Echocardiogram will be ordered to evaluate for structural heart disease and left ventricular systolic function.  Shortness of breath:  Differential diagnoses include new onset of congestive heart failure, pulmonary edema due to elevated systolic blood pressures, underlying  infectious process due to productive cough and leukocytosis.  Recommend Lasix 40 mg IV push twice daily.  Infectious disease work-up deferred to primary team.  Tachycardia: Continue telemetry.  Cardiac and infectious work-up pending.  Check TSH  Elevated BNP: Initiate diuresis.  Echocardiogram ordered.  Uncontrolled hypertension:  On presentation the patient systolic blood pressure was 176/119, at 105 bpm.  Patient states that he is not on any antihypertensive medications at home.  Given the elevated troponins, uncontrolled hypertension recommend checking a D-dimer.  If D-dimer elevated would rule out pulmonary embolism/dissection.  Of note, patient has history per EMR of AAA which the patient does not recall.  Check urine drug screen  Consider nitro drip given the pulmonary congestion on chest x-ray and elevated blood pressure.  Restart oral antihypertensive medications per primary team.  SIRS criteria: Heart rate greater than 90 bpm and white count greater than 12,000.  Recommend infectious work-up.  Consider checking procalcitonin and lactic acid, screen for COVID-19 and flu.  Defer to primary team.  Active tobacco use: Educated on the importance of complete smoking cessation.  Plan of care discussed with ED physician Dr. Lockie Mola.  Patient will be admitted to medicine and cardiology on consult.  Patient's questions and concerns were addressed to his satisfaction. He voices understanding of the instructions provided during this encounter.   This note was created using a voice recognition software as a result there may be grammatical errors inadvertently enclosed that do not reflect the nature of this encounter. Every attempt is made to correct such errors.  Total encounter time 85 minutes. Total Encounter Time as defined by the Centers for Medicare and Medicaid Services includes, in addition to the face-to-face time of a patient visit (documented in the note above) non-face-to-face  time: obtaining and reviewing outside history, ordering and reviewing medications, tests or procedures, care coordination (communications with other health care professionals or caregivers) and documentation in the medical record.  Delilah Shan [facility]  Pager: [phone] Office: [phone] 05/01/2020, 8:34 PM

## 2020-05-01 NOTE — ED Notes (Signed)
Patient is being discharged from the Urgent Care and sent to the Emergency Department via EMS. Per Tresa Endo ,[place], patient is in need of higher level of care due to HTN & CHF. Patient is aware and verbalizes understanding of plan of care.  Vitals:   05/01/20 1236 05/01/20 1355  BP: (!) 177/113 (!) 159/111  Pulse: (!) 108 96  Resp: (!) 28 19  Temp: 97.8 F (36.6 C) 98.2 F (36.8 C)  SpO2: 99% 99%

## 2020-05-01 NOTE — ED Provider Notes (Signed)
[facility]    CSN: [id number] Arrival date & time: 05/01/20  1057      History   Chief Complaint Chief Complaint  Patient presents with   Leg Swelling   Back Pain    HPI Vincent Baxter is a 85 y.o. male.   Patient presents with pain in his right posterior lower ribs x2 to 3 days.  No falls or injury.  He states it is painful to take a deep breath.  He also reports shortness of breath and bilateral pedal edema.  He denies fever, chills, cough, numbness, weakness, or other symptoms.  His medical history includes diabetes, hypertension, COPD, AAA, dyslipidemia, GERD, renal calculus.  The history is provided by the patient and medical records.    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 Authorizing Provider  cephALEXin (KEFLEX) 500 MG capsule Take 1 capsule (500 mg total) by mouth 2 (two) times daily. 07/29/19   Wallis Bamberg, PA-C  cyclobenzaprine (FLEXERIL) 5 MG tablet Take 0.5-1 tablets (2.5-5 mg total) by mouth 3 (three) times daily as needed for muscle spasms. Use lowest effective dose. 07/18/17   Ofilia Neas, PA-C  furosemide (LASIX) 20 MG tablet Take 1 tablet (20 mg total) by mouth daily for 3 days. 05/01/20 05/04/20  Mickie Bail, NP  pioglitazone (ACTOS) 15 MG tablet Take 1 tablet (15 mg total) by  mouth daily. 05/12/12   Newt Lukes, MD  pioglitazone (ACTOS) 30 MG tablet TAKE 1 TABLET (30 MG TOTAL) BY MOUTH DAILY. 02/14/13   Newt Lukes, MD  ranitidine (ZANTAC) 150 MG tablet Take 1 tablet (150 mg total) by mouth 2 (two) times daily. For stomach 03/20/13   Linna Hoff, MD  tamsulosin (FLOMAX) 0.4 MG CAPS capsule Take 1 capsule (0.4 mg total) by mouth daily after supper. 07/29/19   Wallis Bamberg, PA-C  tiZANidine (ZANAFLEX) 4 MG tablet Take 1 tablet (4 mg total) by mouth at bedtime as needed for muscle spasms. 07/29/19   Wallis Bamberg, PA-C  traMADol (ULTRAM) 50 MG tablet Take 1 tablet (50 mg total) by mouth every 8 (eight) hours as needed for severe pain. 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   Review of Systems Review of Systems  Constitutional: Negative for chills and fever.  HENT: Negative for ear pain and sore throat.   Eyes: Negative for pain and visual disturbance.  Respiratory: Positive for shortness of breath. Negative for cough.        Rib pain.  Cardiovascular: Negative for chest pain and palpitations.  Gastrointestinal: Negative for abdominal pain and vomiting.  Genitourinary: Negative for dysuria and hematuria.  Musculoskeletal: Negative for arthralgias and back pain.  Skin: Negative for color change and rash.  Neurological: Negative for seizures and syncope.  All other systems reviewed and are negative.    Physical Exam Triage Vital Signs ED Triage Vitals  Enc Vitals Group     BP      Pulse        Resp      Temp      Temp src      SpO2      Weight      Height      Head Circumference      Peak Flow      Pain Score      Pain Loc      Pain Edu?      Excl. in GC?    No data found.  Updated Vital Signs BP (!) 159/111 (BP Location: Right Arm)    Pulse 96    Temp 98.2 F (36.8 C) (Oral)    Resp 19    SpO2 99%   Visual Acuity Right Eye Distance:   Left Eye Distance:   Bilateral Distance:    Right Eye Near:   Left Eye Near:    Bilateral Near:     Physical Exam Vitals and nursing note reviewed.  Constitutional:      General: He is not in acute distress.    Appearance: He is well-developed.  HENT:     Head: Normocephalic and atraumatic.     Mouth/Throat:     Mouth: Mucous membranes are moist.     Pharynx: Oropharynx is clear.  Eyes:     Conjunctiva/sclera: Conjunctivae normal.  Cardiovascular:     Rate and Rhythm: Normal rate and regular rhythm.     Heart sounds: Normal heart sounds.  Pulmonary:     Effort: Pulmonary effort is normal. No respiratory distress.     Breath sounds: Normal breath sounds. No wheezing or rhonchi.  Abdominal:     Palpations: Abdomen is soft.     Tenderness: There is no abdominal tenderness. There is no guarding or rebound.  Musculoskeletal:        General: Tenderness present. No swelling or deformity. Normal range of motion.     Cervical back: Neck supple.       Back:     Right lower leg: No edema.     Left lower leg: No edema.  Skin:    General: Skin is warm and dry.     Findings: No bruising, erythema, lesion or rash.  Neurological:     General: No focal deficit present.     Mental Status: He is alert and oriented to person, place, and time.     Sensory: No sensory deficit.     Motor: No weakness.     Gait: Gait normal.  Psychiatric:        Mood and Affect: Mood normal.        Behavior: Behavior normal.      UC Treatments / Results  Labs (all  labs ordered are listed, but only abnormal results are displayed) Labs  Reviewed  POCT URINALYSIS DIPSTICK, ED / UC - Abnormal; Notable for the following components:      Result Value   Hgb urine dipstick TRACE (*)    Protein, ur 100 (*)    All other components within normal limits    EKG   Radiology DG Ribs Unilateral W/Chest Right  Result Date: 05/01/2020 CLINICAL DATA:  Rib pain and shortness of breath. EXAM: RIGHT RIBS AND CHEST - 3+ VIEW COMPARISON:  Chest x-ray on 11/14/2011 FINDINGS: Lungs demonstrate diffuse bilateral perihilar edema likely consistent with congestive heart failure. There are associated small bilateral pleural effusions. No airspace consolidation or pneumothorax. The heart size is within normal limits. Right ribs demonstrate no evidence of fracture or bony lesion. IMPRESSION: Findings likely representing congestive heart failure with small bilateral pleural effusions. Electronically Signed   By: Irish Lack M.D.   On: 05/01/2020 13:44    Procedures Procedures (including critical care time)  Medications Ordered in UC Medications - No data to display  Initial Impression / Assessment and Plan / UC Course  I have reviewed the triage vital signs and the nursing notes.  Pertinent labs & imaging results that were available during my care of the patient were reviewed by me and considered in my medical decision making (see chart for details).   Congestive heart failure.  Shortness of breath.  Right rib pain.  Elevated blood pressure reading.  Patient declines additional work-up here and states he would like to go to the emergency department.  He drove himself here and does not feel safe to drive himself to the ED due to shortness of breath; he requests EMS transport.   Final Clinical Impressions(s) / UC Diagnoses   Final diagnoses:  Shortness of breath  Rib pain on right side  Congestive heart failure, unspecified HF chronicity, unspecified heart failure type (HCC)  Elevated blood pressure reading   Discharge Instructions    None    ED Prescriptions    Medication Sig Dispense Auth. Provider   furosemide (LASIX) 20 MG tablet Take 1 tablet (20 mg total) by mouth daily for 3 days. 3 tablet Mickie Bail, NP     I have reviewed the PDMP during this encounter.   Mickie Bail, NP 05/01/20 1406

## 2020-05-01 NOTE — ED Provider Notes (Signed)
Patient signed out at end of shift by Dr. Lockie Mola pending completion of labs/imaging and re-consultation with cardiology to determine admission planning.   85 yo here with SOB, cough, CP with cough, reports fever yesterday H/O HTN, HLD, non-compliance with treatment Significantly hypertensive tonight Elevated troponin x 2, 300 each BNP elevated to 1000 Tachycardic on arrival.   Pending: D-dimer Dissection study Lactic acid  DDx: HTN emergency vs PE vs CHF vs infection   CT dissection study: IMPRESSION: 1. No aortic dissection. 2. A 3.8 cm partially thrombosed infrarenal abdominal aortic aneurysm. No periaortic inflammation or fluid collection. Recommend follow-up ultrasound every 2 years. This recommendation follows ACR consensus guidelines: White Paper of the ACR Incidental Findings Committee II on Vascular Findings. J Am Coll Radiol 2013; 10:789-794. 3. Bilateral upper lobe nodular densities consistent with pneumonia. Clinical correlation and follow-up to resolution recommended. 4. Small bilateral pleural effusions with partial compressive atelectasis of the lower lobes versus pneumonia. Clinical correlation and follow-up to resolution recommended. 5. Aortic Atherosclerosis (ICD10-I70.0) and Emphysema (ICD10-J43.9).  10:30 - Cardiology paged to discuss findings.   Based on CT findings, will admit to medicine for treatment of PNA. Rocephin/Zithro ordered. Unassigned medicine paged for admission, cardiology to follow elevated troponins.    Elpidio Anis, PA-C 05/01/20 2311    Virgina Norfolk, DO 05/02/20 1715

## 2020-05-02 ENCOUNTER — Inpatient Hospital Stay (HOSPITAL_COMMUNITY): Payer: Medicare Other

## 2020-05-02 DIAGNOSIS — I714 Abdominal aortic aneurysm, without rupture: Secondary | ICD-10-CM

## 2020-05-02 DIAGNOSIS — R0600 Dyspnea, unspecified: Secondary | ICD-10-CM | POA: Diagnosis not present

## 2020-05-02 DIAGNOSIS — I340 Nonrheumatic mitral (valve) insufficiency: Secondary | ICD-10-CM

## 2020-05-02 DIAGNOSIS — I509 Heart failure, unspecified: Secondary | ICD-10-CM

## 2020-05-02 DIAGNOSIS — E1122 Type 2 diabetes mellitus with diabetic chronic kidney disease: Secondary | ICD-10-CM

## 2020-05-02 DIAGNOSIS — N183 Chronic kidney disease, stage 3 unspecified: Secondary | ICD-10-CM

## 2020-05-02 DIAGNOSIS — R778 Other specified abnormalities of plasma proteins: Secondary | ICD-10-CM

## 2020-05-02 DIAGNOSIS — I10 Essential (primary) hypertension: Secondary | ICD-10-CM

## 2020-05-02 DIAGNOSIS — R109 Unspecified abdominal pain: Secondary | ICD-10-CM

## 2020-05-02 LAB — RAPID URINE DRUG SCREEN, HOSP PERFORMED
Amphetamines: NOT DETECTED
Barbiturates: NOT DETECTED
Benzodiazepines: NOT DETECTED
Cocaine: NOT DETECTED
Opiates: NOT DETECTED
Tetrahydrocannabinol: POSITIVE — AB

## 2020-05-02 LAB — CBC
HCT: 32.3 % — ABNORMAL LOW (ref 39.0–52.0)
Hemoglobin: 11.3 g/dL — ABNORMAL LOW (ref 13.0–17.0)
MCH: 32.9 pg (ref 26.0–34.0)
MCHC: 35 g/dL (ref 30.0–36.0)
MCV: 94.2 fL (ref 80.0–100.0)
Platelets: 175 10*3/uL (ref 150–400)
RBC: 3.43 MIL/uL — ABNORMAL LOW (ref 4.22–5.81)
RDW: 13.7 % (ref 11.5–15.5)
WBC: 13.7 10*3/uL — ABNORMAL HIGH (ref 4.0–10.5)
nRBC: 0 % (ref 0.0–0.2)

## 2020-05-02 LAB — BASIC METABOLIC PANEL
Anion gap: 9 (ref 5–15)
BUN: 26 mg/dL — ABNORMAL HIGH (ref 8–23)
CO2: 23 mmol/L (ref 22–32)
Calcium: 9.1 mg/dL (ref 8.9–10.3)
Chloride: 109 mmol/L (ref 98–111)
Creatinine, Ser: 1.61 mg/dL — ABNORMAL HIGH (ref 0.61–1.24)
GFR, Estimated: 42 mL/min — ABNORMAL LOW (ref >60)
Glucose, Bld: 150 mg/dL — ABNORMAL HIGH (ref 70–99)
Potassium: 3.8 mmol/L (ref 3.5–5.1)
Sodium: 141 mmol/L (ref 135–145)

## 2020-05-02 LAB — ECHOCARDIOGRAM COMPLETE
Area-P 1/2: 4.71 cm2
Calc EF: 45.9 %
Height: 65 in
S' Lateral: 3.4 cm
Single Plane A2C EF: 45.4 %
Single Plane A4C EF: 47.5 %
Weight: 2000 oz

## 2020-05-02 LAB — LACTIC ACID, PLASMA: Lactic Acid, Venous: 2.6 mmol/L (ref 0.5–1.9)

## 2020-05-02 LAB — MRSA PCR SCREENING: MRSA by PCR: NEGATIVE

## 2020-05-02 LAB — PROCALCITONIN: Procalcitonin: 0.36 ng/mL

## 2020-05-02 LAB — GLUCOSE, CAPILLARY
Glucose-Capillary: 186 mg/dL — ABNORMAL HIGH (ref 70–99)
Glucose-Capillary: 119 mg/dL — ABNORMAL HIGH (ref 70–99)

## 2020-05-02 LAB — HEMOGLOBIN A1C
Hgb A1c MFr Bld: 6.5 % — ABNORMAL HIGH (ref 4.8–5.6)
Mean Plasma Glucose: 139.85 mg/dL

## 2020-05-02 LAB — CBG MONITORING, ED
Glucose-Capillary: 152 mg/dL — ABNORMAL HIGH (ref 70–99)
Glucose-Capillary: 148 mg/dL — ABNORMAL HIGH (ref 70–99)
Glucose-Capillary: 122 mg/dL — ABNORMAL HIGH (ref 70–99)

## 2020-05-02 MED ORDER — INSULIN ASPART 100 UNIT/ML ~~LOC~~ SOLN
0.0000 [IU] | Freq: Three times a day (TID) | SUBCUTANEOUS | Status: DC
  Administered 2020-05-02: 2 [IU] via SUBCUTANEOUS
  Administered 2020-05-02: 1 [IU] via SUBCUTANEOUS
  Administered 2020-05-03: 3 [IU] via SUBCUTANEOUS
  Administered 2020-05-03 (×2): 2 [IU] via SUBCUTANEOUS
  Administered 2020-05-04: 3 [IU] via SUBCUTANEOUS

## 2020-05-02 MED ORDER — ENOXAPARIN SODIUM 30 MG/0.3ML ~~LOC~~ SOLN
30.0000 mg | SUBCUTANEOUS | Status: DC
  Administered 2020-05-03: 30 mg via SUBCUTANEOUS
  Filled 2020-05-02 (×2): qty 0.3

## 2020-05-02 MED ORDER — SODIUM CHLORIDE 0.9 % IV SOLN
500.0000 mg | INTRAVENOUS | Status: DC
  Administered 2020-05-02 – 2020-05-03 (×2): 500 mg via INTRAVENOUS
  Filled 2020-05-02 (×3): qty 500

## 2020-05-02 MED ORDER — SODIUM CHLORIDE 0.9 % IV SOLN
1.0000 g | INTRAVENOUS | Status: DC
  Administered 2020-05-02 – 2020-05-03 (×2): 1 g via INTRAVENOUS
  Filled 2020-05-02 (×2): qty 10

## 2020-05-02 MED ORDER — INSULIN ASPART 100 UNIT/ML ~~LOC~~ SOLN
0.0000 [IU] | Freq: Every day | SUBCUTANEOUS | Status: DC
  Administered 2020-05-03: 2 [IU] via SUBCUTANEOUS

## 2020-05-02 MED ORDER — METOPROLOL TARTRATE 12.5 MG HALF TABLET
12.5000 mg | ORAL_TABLET | Freq: Two times a day (BID) | ORAL | Status: DC
  Administered 2020-05-02 (×3): 12.5 mg via ORAL
  Filled 2020-05-02 (×3): qty 1

## 2020-05-02 NOTE — H&P (Addendum)
History and Physical    Vincent Baxter JOA:[id number] DOB: 1934/10/12 DOA: 05/01/2020  PCP: Patient, No Pcp Per  Patient coming from: Home  I have personally briefly reviewed patient's old medical records in [facility]  Chief Complaint: leg swelling and back pain  HPI: Vincent Baxter is a 85 y.o. male with medical history significant of HTN, kidney stones, DM.  Pt presents to the ED with c/o R flank pain for past 2-3 days.  Radiation to R chest, associated SOB and BLE edema.  He says he has had intermittent R sided rib pain since last feb when they diagnosed him with kidney stone.  Pain worse with deep breath.  Has had subjective fevers, doesn't have thermometer.  No chills or body aches.  Has had COVID vaccination.  Ankle swelling is new according to patient.   ED Course: X ray of ribs neg for rib pathology but does show findings worrisome for CHF.  BNP 900, trops 300 x2, WBC 15k.  No fever, did have initial tachycarda to 110.  Creat 1.47 (baseline).  Lactate 2.2.  BP initially 177/113, now 148/103.  CT aortogram for dissection: 1) no dissection 2) small B pleural effusions 3) opacities in BUL, ? PNA 4) 3.8cm AAA  Pt put on rocephin + Azithro  Cards consulted, they think he has new onset CHF vs demand ischemia.  Dont think this represents plaque wall rupture ACS (see their note).  40mg  IV lasix now and BID ordered.  COVID neg.   Review of Systems: As per HPI, otherwise all review of systems negative.  Past Medical History:  Diagnosis Date  . Diabetes mellitus 01/2009 dx  . DYSLIPIDEMIA   . GERD   . Hypertension   . Kidney stones   . RENAL CALCULUS 01/04/2009    Past Surgical History:  Procedure Laterality Date  . NO PAST SURGERIES       reports that he has been smoking cigarettes. He has been smoking about 0.50 packs per day. He has never used smokeless tobacco. He reports that he does not drink alcohol and does not use drugs.  No Known  Allergies  Family History  Problem Relation Age of Onset  . Hypertension Mother      Prior to Admission medications   Medication Sig Start Date End Date Taking? Authorizing Provider  cyclobenzaprine (FLEXERIL) 5 MG tablet Take 0.5-1 tablets (2.5-5 mg total) by mouth 3 (three) times daily as needed for muscle spasms. Use lowest effective dose. Patient not taking: Reported on 05/01/2020 07/18/17   07/20/17, PA-C  pioglitazone (ACTOS) 15 MG tablet Take 1 tablet (15 mg total) by mouth daily. Patient not taking: Reported on 05/01/2020 05/12/12   05/14/12, MD  pioglitazone (ACTOS) 30 MG tablet TAKE 1 TABLET (30 MG TOTAL) BY MOUTH DAILY. Patient not taking: Reported on 05/01/2020 02/14/13   02/16/13, MD  tamsulosin (FLOMAX) 0.4 MG CAPS capsule Take 1 capsule (0.4 mg total) by mouth daily after supper. Patient not taking: Reported on 05/01/2020 07/29/19   09/26/19, PA-C  tiZANidine (ZANAFLEX) 4 MG tablet Take 1 tablet (4 mg total) by mouth at bedtime as needed for muscle spasms. Patient not taking: Reported on 05/01/2020 07/29/19   09/26/19, PA-C  traMADol (ULTRAM) 50 MG tablet Take 1 tablet (50 mg total) by mouth every 8 (eight) hours as needed for severe pain. Patient not taking: Reported on 05/01/2020 07/29/19   09/26/19, PA-C  furosemide (LASIX) 20  MG tablet Take 1 tablet (20 mg total) by mouth daily for 3 days. 05/01/20 05/01/20  Mickie Bail, NP    Physical Exam: Vitals:   05/01/20 2045 05/01/20 2215 05/01/20 2318 05/01/20 2321  BP: (!) 152/103 (!) 142/94 (!) 148/103   Pulse: 89 88 94   Resp: Temp:      TempSrc:      SpO2: 96% 96% 94%   Weight:    56.7 kg  Height:     (1.651 m)    Constitutional: NAD, calm, comfortable Eyes: PERRL, lids and conjunctivae normal ENMT: Mucous membranes are moist. Posterior pharynx clear of any exudate or lesions.Normal dentition.  Neck: normal, supple, no masses, no thyromegaly Respiratory: clear  to auscultation bilaterally, no wheezing, no crackles. Normal respiratory effort. No accessory muscle use.  Cardiovascular: Regular rate and rhythm, no murmurs / rubs / gallops. 1+ BLE edema. 2+ pedal pulses. No carotid bruits.  Abdomen: no tenderness, no masses palpated. No hepatosplenomegaly. Bowel sounds positive.  Musculoskeletal: no clubbing / cyanosis. No joint deformity upper and lower extremities. Good ROM, no contractures. Normal muscle tone.  Skin: no rashes, lesions, ulcers. No induration Neurologic: CN 2-12 grossly intact. Sensation intact, DTR normal. Strength 5/5 in all 4.  Psychiatric: Normal judgment and insight. Alert and oriented x 3. Normal mood.    Labs on Admission: I have personally reviewed following labs and imaging studies  CBC: Recent Labs  Lab 05/01/20 1455  WBC 15.0*  HGB 11.9*  HCT 36.9*  MCV 100.8*  PLT 181   Basic Metabolic Panel: Recent Labs  Lab 05/01/20 1455  NA 143  K 4.3  CL 111  CO2 20*  GLUCOSE 182*  BUN 20  CREATININE 1.47*  CALCIUM 9.1   GFR: Estimated Creatinine Clearance: 31.1 mL/min (A) (by C-G formula based on SCr of 1.47 mg/dL (H)). Liver Function Tests: Recent Labs  Lab 05/01/20 1949  AST 27  ALT 92*  ALKPHOS 104  BILITOT 1.2  PROT 6.3*  ALBUMIN 3.4*   No results for input(s): LIPASE, AMYLASE in the last 168 hours. No results for input(s): AMMONIA in the last 168 hours. Coagulation Profile: No results for input(s): INR, PROTIME in the last 168 hours. Cardiac Enzymes: No results for input(s): CKTOTAL, CKMB, CKMBINDEX, TROPONINI in the last 168 hours. BNP (last 3 results) No results for input(s): PROBNP in the last 8760 hours. HbA1C: No results for input(s): HGBA1C in the last 72 hours. CBG: No results for input(s): GLUCAP in the last 168 hours. Lipid Profile: No results for input(s): CHOL, HDL, LDLCALC, TRIG, CHOLHDL, LDLDIRECT in the last 72 hours. Thyroid Function Tests: Recent Labs    05/01/20 2206  TSH  0.845   Anemia Panel: No results for input(s): VITAMINB12, FOLATE, FERRITIN, TIBC, IRON, RETICCTPCT in the last 72 hours. Urine analysis:    Component Value Date/Time   COLORURINE YELLOW 11/11/2012 1001   APPEARANCEUR CLEAR 11/11/2012 1001   LABSPEC 1.020 05/01/2020 1238   PHURINE 5.5 05/01/2020 1238   GLUCOSEU NEGATIVE 05/01/2020 1238   GLUCOSEU NEGATIVE 02/11/2012 0931   HGBUR TRACE (A) 05/01/2020 1238   BILIRUBINUR NEGATIVE 05/01/2020 1238   KETONESUR NEGATIVE 05/01/2020 1238   PROTEINUR 100 (A) 05/01/2020 1238   UROBILINOGEN 1.0 05/01/2020 1238   NITRITE NEGATIVE 05/01/2020 1238   LEUKOCYTESUR NEGATIVE 05/01/2020 1238    Radiological Exams on Admission: DG Ribs Unilateral W/Chest Right  Result Date: 05/01/2020 CLINICAL DATA:  Rib pain and shortness of  breath. EXAM: RIGHT RIBS AND CHEST - 3+ VIEW COMPARISON:  Chest x-ray on 11/14/2011 FINDINGS: Lungs demonstrate diffuse bilateral perihilar edema likely consistent with congestive heart failure. There are associated small bilateral pleural effusions. No airspace consolidation or pneumothorax. The heart size is within normal limits. Right ribs demonstrate no evidence of fracture or bony lesion. IMPRESSION: Findings likely representing congestive heart failure with small bilateral pleural effusions. Electronically Signed   By: Irish Lack M.D.   On: 05/01/2020 13:44   CT Angio Chest/Abd/Pel for Dissection W and/or Wo Contrast  Result Date: 05/01/2020 CLINICAL DATA:  85 year old male with chest and back pain. Concern for aortic dissection. EXAM: CT ANGIOGRAPHY CHEST, ABDOMEN AND PELVIS TECHNIQUE: Non-contrast CT of the chest was initially obtained. Multidetector CT imaging through the chest, abdomen and pelvis was performed using the standard protocol during bolus administration of intravenous contrast. Multiplanar reconstructed images and MIPs were obtained and reviewed to evaluate the vascular anatomy. CONTRAST:  85mL OMNIPAQUE  IOHEXOL 350 MG/ML SOLN COMPARISON:  CT of the abdomen pelvis dated 11/11/2012. FINDINGS: Limited study due to paucity of intra-abdominal and mesenteric fat and cachexia as well as secondary to streak artifacts caused by overlying support wires. CTA CHEST FINDINGS Cardiovascular: There is no cardiomegaly or pericardial effusion. There is mild atherosclerotic calcification of the thoracic aorta. No aneurysmal dilatation or dissection. The origins of the great vessels of the aortic arch appear patent as visualized. The central pulmonary arteries are patent. Mediastinum/Nodes: No hilar or mediastinal adenopathy. The esophagus is grossly unremarkable. No mediastinal fluid collection Lungs/Pleura: Small bilateral pleural effusions with partial compressive atelectasis of the lower lobes versus pneumonia. Clinical correlation is recommended. There is background of emphysema. Bilateral upper lobe nodular densities most consistent with multifocal pneumonia. Clinical correlation and follow-up to resolution recommended. There is no pneumothorax. The central airways are patent. Musculoskeletal: Degenerative changes of the spine. No acute osseous pathology. There is loss of subcutaneous fat and cachexia. Review of the MIP images confirms the above findings. CTA ABDOMEN AND PELVIS FINDINGS VASCULAR Aorta: There is moderate atherosclerotic calcification of the abdominal aorta. There is a 3.8 cm partially thrombosed infrarenal abdominal aortic aneurysm. Evaluation of the aneurysm is limited as precontrast images are not provided through the abdomen. There is however no contrast blush within the thrombosed portion of the aneurysm lumen. The no periaortic inflammation or fluid collection. No extraluminal contrast. No dissection. Celiac: Patent without evidence of aneurysm, dissection, vasculitis or significant stenosis. SMA: Patent without evidence of aneurysm, dissection, vasculitis or significant stenosis. Renals: The right renal  artery is small and appears narrow, likely related to chronic scarring. There is diminished flow in the right renal artery. The left renal artery is patent. IMA: Patent without evidence of aneurysm, dissection, vasculitis or significant stenosis. Inflow: Mild atherosclerotic calcification. No aneurysmal dilatation or dissection. Veins: No obvious venous abnormality within the limitations of this arterial phase study. Review of the MIP images confirms the above findings. NON-VASCULAR6 No intra-abdominal free air or free fluid. Hepatobiliary: The liver and gallbladder are grossly unremarkable. Pancreas: Unremarkable. No pancreatic ductal dilatation or surrounding inflammatory changes. Spleen: Normal in size without focal abnormality. Adrenals/Urinary Tract: The adrenal glands unremarkable. Moderate right renal parenchyma atrophy. Nonobstructing right renal inferior pole calculus measures 10 mm. No hydronephrosis. The left kidney is unremarkable. The urinary bladder is grossly unremarkable Stomach/Bowel: There is no bowel obstruction or active inflammation. The appendix is poorly visualized. Lymphatic: No adenopathy. Reproductive: The prostate is grossly unremarkable. Other: There is loss of  subcutaneous fat and cachexia. Musculoskeletal: Osteopenia with degenerative changes of the spine. No acute osseous pathology. Review of the MIP images confirms the above findings. IMPRESSION: 1. No aortic dissection. 2. A 3.8 cm partially thrombosed infrarenal abdominal aortic aneurysm. No periaortic inflammation or fluid collection. Recommend follow-up ultrasound every 2 years. This recommendation follows ACR consensus guidelines: White Paper of the ACR Incidental Findings Committee II on Vascular Findings. J Am Coll Radiol 2013; 10:789-794. 3. Bilateral upper lobe nodular densities consistent with pneumonia. Clinical correlation and follow-up to resolution recommended. 4. Small bilateral pleural effusions with partial  compressive atelectasis of the lower lobes versus pneumonia. Clinical correlation and follow-up to resolution recommended. 5. Aortic Atherosclerosis (ICD10-I70.0) and Emphysema (ICD10-J43.9). Electronically Signed   By: Elgie CollardArash  Radparvar M.D.   On: 05/01/2020 22:04    EKG: Independently reviewed.  Assessment/Plan Principal Problem:   New onset of congestive heart failure (HCC) Active Problems:   DM2 (diabetes mellitus, type 2) (HCC)   Essential hypertension   AAA (abdominal aortic aneurysm) (HCC)   Elevated troponin   Right flank pain    1. New onset of CHF - 1. B pulmonary opacities, B small pleural effusions, BNP elevation, peripheral edema. 2. DDx includes CAP 3. Cards feels ACS unlikely (see their note) 4. CHF pathway 5. 2d echo 6. Lasix 40mg  IV now and BID 7. Tele monitor 8. TSH nl 9. Strict intake and output 10. Repeat CBC in AM 11. Check procalcitonin 12. BCx pending 13. Got rocephin + Azithro in AM, defer to day team if they want to continue these or not 2. R flank pain - 1. No mention of obstructing kidney stones on CT today. 3. DM2 - 1. Sensitive SSI AC/HS 4. HTN - 1. Pt got lasix in ED 2. Sounds like pt not taking any home meds currently 3. Starting pt on lopressor 5. AAA - 1. F/u US in 2 years  DVT prophylaxis: Lovenox Code Status: Full Family Communication: No family in room Disposition Plan: Home after CHF work up CiscoConsults called: Cardiology Admission status: Admit to inpatient  Severity of Illness: The appropriate patient status for this patient is INPATIENT. Inpatient status is judged to be reasonable and necessary in order to provide the required intensity of service to ensure the patient's safety. The patient's presenting symptoms, physical exam findings, and initial radiographic and laboratory data in the context of their chronic comorbidities is felt to place them at high risk for further clinical deterioration. Furthermore, it is not anticipated  that the patient will be medically stable for discharge from the hospital within 2 midnights of admission. The following factors support the patient status of inpatient.   IP status for new onset CHF.   * I certify that at the point of admission it is my clinical judgment that the patient will require inpatient hospital care spanning beyond 2 midnights from the point of admission due to high intensity of service, high risk for further deterioration and high frequency of surveillance required.*    Karstyn Birkey M. DO [facility]  How to contact the [facility] Attending or Consulting provider 7A - 7P or covering provider during after hours 7P -7A, for this patient?  1. Check the care team in [facility] and look for a) attending/consulting [facility] provider listed and b) the [facility] team listed 2. Log into [email or web address]  Amion Physician Scheduling and messaging for groups and whole hospitals  On call and physician scheduling software for group practices, residents, hospitalists and other medical providers for  call, clinic, rotation and shift schedules. On[employer] is a hospital-wide system for scheduling doctors and paging doctors on call. EasyPlot is for scientific plotting and data analysis.  [email or web address]  and use [place]'s universal password to access. If you do not have the password, please contact the hospital operator.  3. Locate the [facility] provider you are looking for under [facility] and page to a number that you can be directly reached. 4. If you still have difficulty reaching the provider, please page the [facility] (Director on Call) for the Hospitalists listed on amion for assistance.  05/02/2020, 12:11 AM

## 2020-05-02 NOTE — Plan of Care (Signed)
  Problem: Education: Goal: Knowledge of General Education information will improve Description: Including pain rating scale, medication(s)/side effects and non-pharmacologic comfort measures Outcome: Progressing   Problem: Health Behavior/Discharge Planning: Goal: Ability to manage health-related needs will improve Outcome: Progressing   Problem: Clinical Measurements: Goal: Ability to maintain clinical measurements within normal limits will improve Outcome: Progressing Goal: Will remain free from infection Outcome: Progressing Goal: Diagnostic test results will improve Outcome: Progressing Goal: Respiratory complications will improve Outcome: Progressing Goal: Cardiovascular complication will be avoided Outcome: Progressing   Problem: Activity: Goal: Risk for activity intolerance will decrease Outcome: Progressing   Problem: Nutrition: Goal: Adequate nutrition will be maintained Outcome: Progressing   Problem: Coping: Goal: Level of anxiety will decrease Outcome: Progressing   Problem: Elimination: Goal: Will not experience complications related to bowel motility Outcome: Progressing Goal: Will not experience complications related to urinary retention Outcome: Progressing   Problem: Pain Managment: Goal: General experience of comfort will improve Outcome: Progressing   Problem: Safety: Goal: Ability to remain free from injury will improve Outcome: Progressing   Problem: Safety: Goal: Ability to remain free from injury will improve Outcome: Progressing   Problem: Skin Integrity: Goal: Risk for impaired skin integrity will decrease Outcome: Progressing   Problem: Education: Goal: Ability to demonstrate management of disease process will improve Outcome: Progressing Goal: Ability to verbalize understanding of medication therapies will improve Outcome: Progressing Goal: Individualized Educational Video(s) Outcome: Progressing   Problem: Activity: Goal:  Capacity to carry out activities will improve Outcome: Progressing   Problem: Cardiac: Goal: Ability to achieve and maintain adequate cardiopulmonary perfusion will improve Outcome: Progressing

## 2020-05-02 NOTE — ED Notes (Addendum)
Pt ambulatory to restroom independently, even steady gait. Denies feeling lightheaded or dizzy while ambulatory States that pain at flank gets worse with movement   Skin is warm, dry and intact Respirations are even and non-labored, pt does not appear in distress.    Provided meal tray

## 2020-05-02 NOTE — Progress Notes (Signed)
MD made aware of change in pts EKG rhythm. EKG obtained and placed in pts chart. Pt asymptomatic with no complaints. Will continue to monitor.

## 2020-05-02 NOTE — Progress Notes (Signed)
Patient seen and examined at bedside, patient admitted after midnight, please see earlier detailed admission note by Hillary Bow, DO. Briefly, patient presented secondary to right sided chest pain with concern for heart failure and pneumonia. Lasix and antibiotics started  BP (!) 136/98   Pulse 68   Temp (!) 97.3 F (36.3 C) (Oral)   Resp 16   Ht 5\' 5"  (1.651 m)   Wt 56.7 kg   SpO2 100%   BMI 20.80 kg/m   General exam: Appears calm and comfortable Respiratory system: Diminished breath sounds. Respiratory effort normal. Cardiovascular system: S1 & S2 heard, RRR. No murmurs, rubs, gallops or clicks. Gastrointestinal system: Abdomen is nondistended, soft and nontender. No organomegaly or masses felt. Normal bowel sounds heard. Central nervous system: Alert and oriented. No focal neurological deficits. Musculoskeletal: No edema. No calf tenderness Skin: No cyanosis. No rashes Psychiatry: Judgement and insight appear normal. Mood & affect appropriate.   A/P:  Acute heart failure Likely diagnosis. Started on IV lasix. -Follow-up Transthoracic Echocardiogram  -Continue Lasix 40 mg IV BID   Community acquired pneumonia Likely infection from imaging. -Resume Ceftriaxone and azithromycin -Repeat outpatient imaging to ensure resolution -RVP to rule out viral infection  Right sided chest pain Imaging significant only for pneumonia. History of kidney stones with right kidney stone on imaging but pain does not seem consistent.   , MD [facility] 05/02/2020, 1:54 PM

## 2020-05-02 NOTE — Progress Notes (Signed)
Came to evaluate the patient around 8:24 AM.  However patient was not in his room. We will see him later.  Tessa Lerner, [place], [facility]  Pager: [phone] Office: [phone]

## 2020-05-02 NOTE — Progress Notes (Signed)
  Echocardiogram 2D Echocardiogram has been performed.  Vincent Baxter 05/02/2020, 4:01 PM

## 2020-05-02 NOTE — Progress Notes (Addendum)
Floor coverage  Notified by RN that telemetry is showing frequent runs of PJCs and PVCs.  Patient is asymptomatic.  EKG was done and showing new second-degree AV block (Mobitz type II), QTC 472.  Discussed with cardiologist Dr. Meredeth Ide who reviewed the EKG and feels there are multiple competing atrial foci but no EKG evidence of a high degree AV block.  Recommending avoiding AV nodal blocking agents.  Patient is currently on metoprolol which has been discontinued.  Will also check TSH, potassium, and magnesium levels.  Addendum: TSH checked yesterday was normal.  Update: Potassium 4.2, magnesium 1.5.  Magnesium supplementation ordered.

## 2020-05-03 ENCOUNTER — Inpatient Hospital Stay (HOSPITAL_COMMUNITY): Payer: Medicare Other

## 2020-05-03 DIAGNOSIS — Z720 Tobacco use: Secondary | ICD-10-CM

## 2020-05-03 DIAGNOSIS — I493 Ventricular premature depolarization: Secondary | ICD-10-CM

## 2020-05-03 DIAGNOSIS — F17200 Nicotine dependence, unspecified, uncomplicated: Secondary | ICD-10-CM

## 2020-05-03 DIAGNOSIS — R9431 Abnormal electrocardiogram [ECG] [EKG]: Secondary | ICD-10-CM

## 2020-05-03 DIAGNOSIS — I714 Abdominal aortic aneurysm, without rupture: Secondary | ICD-10-CM

## 2020-05-03 DIAGNOSIS — I491 Atrial premature depolarization: Secondary | ICD-10-CM

## 2020-05-03 LAB — BASIC METABOLIC PANEL
Anion gap: 12 (ref 5–15)
BUN: 45 mg/dL — ABNORMAL HIGH (ref 8–23)
CO2: 24 mmol/L (ref 22–32)
Calcium: 8.9 mg/dL (ref 8.9–10.3)
Chloride: 104 mmol/L (ref 98–111)
Creatinine, Ser: 1.9 mg/dL — ABNORMAL HIGH (ref 0.61–1.24)
GFR, Estimated: 35 mL/min — ABNORMAL LOW (ref >60)
Glucose, Bld: 161 mg/dL — ABNORMAL HIGH (ref 70–99)
Potassium: 4.4 mmol/L (ref 3.5–5.1)
Sodium: 140 mmol/L (ref 135–145)

## 2020-05-03 LAB — VITAMIN B12: Vitamin B-12: 320 pg/mL (ref 180–914)

## 2020-05-03 LAB — MAGNESIUM
Magnesium: 1.5 mg/dL — ABNORMAL LOW (ref 1.7–2.4)
Magnesium: 2.1 mg/dL (ref 1.7–2.4)

## 2020-05-03 LAB — GLUCOSE, CAPILLARY
Glucose-Capillary: 166 mg/dL — ABNORMAL HIGH (ref 70–99)
Glucose-Capillary: 189 mg/dL — ABNORMAL HIGH (ref 70–99)
Glucose-Capillary: 236 mg/dL — ABNORMAL HIGH (ref 70–99)
Glucose-Capillary: 203 mg/dL — ABNORMAL HIGH (ref 70–99)

## 2020-05-03 LAB — FOLATE: Folate: 6.8 ng/mL (ref >5.9)

## 2020-05-03 LAB — POTASSIUM: Potassium: 4.2 mmol/L (ref 3.5–5.1)

## 2020-05-03 MED ORDER — METOPROLOL TARTRATE 25 MG PO TABS
25.0000 mg | ORAL_TABLET | Freq: Two times a day (BID) | ORAL | Status: DC
  Administered 2020-05-03 – 2020-05-04 (×3): 25 mg via ORAL
  Filled 2020-05-03 (×3): qty 1

## 2020-05-03 MED ORDER — ENSURE ENLIVE PO LIQD
237.0000 mL | Freq: Two times a day (BID) | ORAL | Status: DC
  Administered 2020-05-03 – 2020-05-04 (×3): 237 mL via ORAL

## 2020-05-03 MED ORDER — TRAMADOL HCL 50 MG PO TABS
50.0000 mg | ORAL_TABLET | Freq: Two times a day (BID) | ORAL | Status: DC | PRN
  Administered 2020-05-03: 50 mg via ORAL
  Filled 2020-05-03: qty 1

## 2020-05-03 MED ORDER — ATORVASTATIN CALCIUM 10 MG PO TABS
20.0000 mg | ORAL_TABLET | Freq: Every day | ORAL | Status: DC
  Administered 2020-05-03: 20 mg via ORAL
  Filled 2020-05-03: qty 2

## 2020-05-03 MED ORDER — ADULT MULTIVITAMIN W/MINERALS CH
1.0000 | ORAL_TABLET | Freq: Every day | ORAL | Status: DC
  Administered 2020-05-03 – 2020-05-04 (×2): 1 via ORAL
  Filled 2020-05-03 (×2): qty 1

## 2020-05-03 MED ORDER — MAGNESIUM SULFATE 2 GM/50ML IV SOLN
2.0000 g | Freq: Once | INTRAVENOUS | Status: AC
  Administered 2020-05-03: 2 g via INTRAVENOUS
  Filled 2020-05-03: qty 50

## 2020-05-03 MED ORDER — ASPIRIN EC 81 MG PO TBEC
81.0000 mg | DELAYED_RELEASE_TABLET | Freq: Every day | ORAL | Status: DC
  Administered 2020-05-03 – 2020-05-04 (×2): 81 mg via ORAL
  Filled 2020-05-03 (×2): qty 1

## 2020-05-03 NOTE — Progress Notes (Signed)
PROGRESS NOTE    [name]  PQZ:[id number] DOB: 1934/10/19 DOA: 05/01/2020 PCP: Patient, No Pcp Per   Brief Narrative: Vincent Baxter is a 85 y.o. male with a history of hypertension, kidney stones, diabetes mellitus, type 2   Assessment & Plan:   Principal Problem:   New onset of congestive heart failure ([place]) Active Problems:   DM2 (diabetes mellitus, type 2) ([place])   Essential hypertension   AAA (abdominal aortic aneurysm) ([facility])   Elevated troponin   Right flank pain   Acute diastolic heart failure Grade II diastolic dysfunction with an EF of 50-55% on Transthoracic Echocardiogram. Started on Lasix on admission but appears to be over-diuresed. -Discontinue Lasix for now -BMP daily; if creatinine stable, can start on oral Lasix  Elevated troponin Peak of 306 with flat trend. Cardiology consulted and per assessment, likely demand ischemia. Patient opted for outpatient stress test.  Community acquired pneumonia -Continue Ceftriaxone and azithromycin  Abdominal aortic aneurysm 3.8 cm infrarenal aneurysm that is partially thrombosed -Vascular surgery consult -Continue aspirin and Lipitor  AKI on CKD stage IIIa Baseline creatinine appears to be around 1.5  Arrhythmia/abnormal EKG Initial concern for second degree type II heart block. Discussed with cardiology who thinks this is more PACs with no evidence of type 2 second degree. Patient also with NSVT overnight. Magnesium was low and was replenished. -Telemetry  Nephrolithiasis Non-obstructing of right renal pole.  Primary hypertension Metoprolol started by cardiology.  Right sided whole body pain This is a very chronic issue with unknown etiology. He does have some radicular sounding symptoms of right LE -Will obtain initial lumbar x-ray -Will possibly need follow-up MRI  Diabetes mellitus, type 2 Hemoglobin A1C of 6.5%. Patient is on Actos as an outpatient -Continue SSI   DVT prophylaxis:  Lovenox Code Status:   Code Status: Full Code Family Communication: None at bedside Disposition Plan: Discharge likely in 24-48 hours pending stabilization of AKI and PT recommendations   Consultants:   Cardiology  Vascular surgery  Procedures:   TRANSTHORACIC ECHOCARDIOGRAM (05/02/2020) IMPRESSIONS    1. Left ventricular ejection fraction, by estimation, is 50 to 55%. The  left ventricle has low normal function. The left ventricle has no regional  wall motion abnormalities. There is moderate left ventricular hypertrophy.  Left ventricular diastolic  parameters are consistent with Grade II diastolic dysfunction  (pseudonormalization).  2. Right ventricular systolic function is normal. The right ventricular  size is normal.  3. The mitral valve is normal in structure. Mild mitral valve  regurgitation. No evidence of mitral stenosis.  4. The aortic valve is calcified. Aortic valve regurgitation is not  visualized. Mild to moderate aortic valve sclerosis/calcification is  present, without any evidence of aortic stenosis.  5. The inferior vena cava is normal in size with greater than 50%  respiratory variability, suggesting right atrial pressure of 3 mmHg.  Antimicrobials:  Ceftriaxone  Azithromycin    Subjective: Patient reports right sided pain from his jaw all the way down to his ankle for which he has had for several years. He requests x-rays of his whole right side. He reports specifically some posterior thigh discomfort as well. Some intermittent back pain. He has also noticed that he has to bend over to help with some pain. EKG showed concern for 2nd degree heart block with associated Mobitz II pattern.  Objective: Vitals:   05/03/20 0457 05/03/20 0729 05/03/20 0730 05/03/20 0800  BP: 136/78 120/84  121/82  Pulse: 66  Resp: _0 Temp: 97.6 F (36.4 C) 97.9 F (36.6 C)    TempSrc: Oral Oral    SpO2: 95%   94%  Weight: 50.3 kg     Height:         Intake/Output Summary (Last 24 hours) at 05/03/2020 0911 Last data filed at 05/03/2020 0824 Gross per 24 hour  Intake 403 ml  Output 2150 ml  Net -1747 ml   Filed Weights   05/01/20 2321 05/03/20 0457  Weight: 56.7 kg 50.3 kg    Examination:  General exam: Appears calm and comfortable. Very thin appearing with diminished muscle mass Respiratory system: Clear to auscultation. Respiratory effort normal. Cardiovascular system: S1 & S2 heard, RRR. No murmurs, rubs, gallops or clicks. Gastrointestinal system: Abdomen is nondistended, soft and nontender. No organomegaly or masses felt. Normal bowel sounds heard. Central nervous system: Alert and oriented. No focal neurological deficits. Musculoskeletal: No edema. No calf tenderness Skin: No cyanosis. No rashes Psychiatry: Judgement and insight appear normal. Mood & affect appropriate.     Data Reviewed: I have personally reviewed following labs and imaging studies  CBC Lab Results  Component Value Date   WBC 13.7 (H) 05/02/2020   RBC 3.43 (L) 05/02/2020   HGB 11.3 (L) 05/02/2020   HCT 32.3 (L) 05/02/2020   MCV 94.2 05/02/2020   MCH 32.9 05/02/2020   PLT 175 05/02/2020   MCHC 35.0 05/02/2020   RDW 13.7 05/02/2020   LYMPHSABS 1.6 02/11/2012   MONOABS 0.6 02/11/2012   EOSABS 0.3 02/11/2012   BASOSABS 0.1 34/19/3790     Last metabolic panel Lab Results  Component Value Date   NA 140 05/03/2020   K 4.4 05/03/2020   CL 104 05/03/2020   CO2 24 05/03/2020   BUN 45 (H) 05/03/2020   CREATININE 1.90 (H) 05/03/2020   GLUCOSE 161 (H) 05/03/2020   GFRNONAA 35 (L) 05/03/2020   GFRAA  12/12/2009    >60        The eGFR has been calculated using the MDRD equation. This calculation has not been validated in all clinical situations. eGFR's persistently <60 mL/min signify possible Chronic Kidney Disease.   CALCIUM 8.9 05/03/2020   PHOS 3.0 02/23/2009   PROT 6.3 (L) 05/01/2020   ALBUMIN 3.4 (L) 05/01/2020   BILITOT  1.2 05/01/2020   ALKPHOS 104 05/01/2020   AST 27 05/01/2020   ALT 92 (H) 05/01/2020   ANIONGAP 12 05/03/2020    CBG (last 3)  Recent Labs    05/02/20 1636 05/02/20 2108 05/03/20 0611  GLUCAP 186* 119* 166*     GFR: Estimated Creatinine Clearance: 21.3 mL/min (A) (by C-G formula based on SCr of 1.9 mg/dL (H)).  Coagulation Profile: No results for input(s): INR, PROTIME in the last 168 hours.  Recent Results (from the past 240 hour(s))  Blood culture (routine x 2)     Status: None (Preliminary result)   Collection Time: 05/01/20  9:05 PM   Specimen: BLOOD  Result Value Ref Range Status   Specimen Description BLOOD RIGHT ANTECUBITAL  Final   Special Requests   Final    BOTTLES DRAWN AEROBIC AND ANAEROBIC Blood Culture adequate volume   Culture   Final    NO GROWTH < 24 HOURS Performed at [facility], [address], [place], [place] [address]    Report Status PENDING  Incomplete  Blood culture (routine x 2)     Status: None (Preliminary result)   Collection Time:  05/01/20  9:57 PM   Specimen: BLOOD  Result Value Ref Range Status   Specimen Description BLOOD LEFT ANTECUBITAL  Final   Special Requests   Final    BOTTLES DRAWN AEROBIC AND ANAEROBIC Blood Culture results may not be optimal due to an inadequate volume of blood received in culture bottles   Culture   Final    NO GROWTH < 24 HOURS Performed at [facility], [place] [address], [place], [place] [address]    Report Status PENDING  Incomplete  Respiratory Panel by RT PCR (Flu A&B, Covid) - Nasopharyngeal Swab     Status: None   Collection Time: 05/01/20  9:59 PM   Specimen: Nasopharyngeal Swab  Result Value Ref Range Status   SARS Coronavirus 2 by RT PCR NEGATIVE NEGATIVE Final    Comment: (NOTE) SARS-CoV-2 target nucleic acids are NOT DETECTED.  The SARS-CoV-2 RNA is generally detectable in upper respiratoy specimens during the acute phase of infection. The lowest concentration of SARS-CoV-2  viral copies this assay can detect is 131 copies/mL. A negative result does not preclude SARS-Cov-2 infection and should not be used as the sole basis for treatment or other patient management decisions. A negative result may occur with  improper specimen collection/handling, submission of specimen other than nasopharyngeal swab, presence of viral mutation(s) within the areas targeted by this assay, and inadequate number of viral copies (<131 copies/mL). A negative result must be combined with clinical observations, patient history, and epidemiological information. The expected result is Negative.  Fact Sheet for Patients:  PinkCheek.be  Fact Sheet for Healthcare Providers:  GravelBags.it  This test is no t yet approved or cleared by the [place] FDA and  has been authorized for detection and/or diagnosis of SARS-CoV-2 by FDA under an Emergency Use Authorization (EUA). This EUA will remain  in effect (meaning this test can be used) for the duration of the COVID-19 declaration under Section 564(b)(1) of the Act, 21 U.S.C. section 360bbb-3(b)(1), unless the authorization is terminated or revoked sooner.     Influenza A by PCR NEGATIVE NEGATIVE Final   Influenza B by PCR NEGATIVE NEGATIVE Final    Comment: (NOTE) The Xpert Xpress SARS-CoV-2/FLU/RSV assay is intended as an aid in  the diagnosis of influenza from Nasopharyngeal swab specimens and  should not be used as a sole basis for treatment. Nasal washings and  aspirates are unacceptable for Xpert Xpress SARS-CoV-2/FLU/RSV  testing.  Fact Sheet for Patients: PinkCheek.be  Fact Sheet for Healthcare Providers: GravelBags.it  This test is not yet approved or cleared by the [place] FDA and  has been authorized for detection and/or diagnosis of SARS-CoV-2 by  FDA under an Emergency Use Authorization (EUA).  This EUA will remain  in effect (meaning this test can be used) for the duration of the  Covid-19 declaration under Section 564(b)(1) of the Act, 21  U.S.C. section 360bbb-3(b)(1), unless the authorization is  terminated or revoked. Performed at [facility], [place] [address], [place], [place] [address]   MRSA PCR Screening     Status: None   Collection Time: 05/02/20  6:31 PM   Specimen: Nasal Mucosa; Nasopharyngeal  Result Value Ref Range Status   MRSA by PCR NEGATIVE NEGATIVE Final    Comment:        The GeneXpert MRSA Assay (FDA approved for NASAL specimens only), is one component of a comprehensive MRSA colonization surveillance program. It is not intended to diagnose MRSA infection nor to  guide or monitor treatment for MRSA infections. Performed at [facility], [place] [address], [place], [place] [address]         Radiology Studies: DG Ribs Unilateral W/Chest Right  Result Date: 05/01/2020 CLINICAL DATA:  Rib pain and shortness of breath. EXAM: RIGHT RIBS AND CHEST - 3+ VIEW COMPARISON:  Chest x-ray on 11/14/2011 FINDINGS: Lungs demonstrate diffuse bilateral perihilar edema likely consistent with congestive heart failure. There are associated small bilateral pleural effusions. No airspace consolidation or pneumothorax. The heart size is within normal limits. Right ribs demonstrate no evidence of fracture or bony lesion. IMPRESSION: Findings likely representing congestive heart failure with small bilateral pleural effusions. Electronically Signed   By: Aletta Edouard M.D.   On: 05/01/2020 13:44   ECHOCARDIOGRAM COMPLETE  Result Date: 05/02/2020    ECHOCARDIOGRAM REPORT   Patient Name:   Vincent Baxter Date of Exam: 05/02/2020 Medical Rec #:  [id number]      Height:       65.0 in Accession #:    [id number]     Weight:       125.0 lb Date of Birth:  27-Feb-1935     BSA:          1.620 m Patient Age:    28 years       BP:           129/81 mmHg Patient Gender: M               HR:           91 bpm. Exam Location:  Inpatient Procedure: 2D Echo Indications:    Dyspnea 786.09 / R06.00  History:        Patient has no prior history of Echocardiogram examinations.                 Risk Factors:Diabetes, Hypertension and Dyslipidemia. AAA.                 Pneumonia.  Sonographer:    Darlina Sicilian RDCS Referring Phys: [id number] [place]  1. Left ventricular ejection fraction, by estimation, is 50 to 55%. The left ventricle has low normal function. The left ventricle has no regional wall motion abnormalities. There is moderate left ventricular hypertrophy. Left ventricular diastolic parameters are consistent with Grade II diastolic dysfunction (pseudonormalization).  2. Right ventricular systolic function is normal. The right ventricular size is normal.  3. The mitral valve is normal in structure. Mild mitral valve regurgitation. No evidence of mitral stenosis.  4. The aortic valve is calcified. Aortic valve regurgitation is not visualized. Mild to moderate aortic valve sclerosis/calcification is present, without any evidence of aortic stenosis.  5. The inferior vena cava is normal in size with greater than 50% respiratory variability, suggesting right atrial pressure of 3 mmHg. FINDINGS  Left Ventricle: Left ventricular ejection fraction, by estimation, is 50 to 55%. The left ventricle has low normal function. The left ventricle has no regional wall motion abnormalities. The left ventricular internal cavity size was normal in size. There is moderate left ventricular hypertrophy. Left ventricular diastolic parameters are consistent with Grade II diastolic dysfunction (pseudonormalization). Right Ventricle: The right ventricular size is normal. No increase in right ventricular wall thickness. Right ventricular systolic function is normal. Left Atrium: Left atrial size was normal in size. Right Atrium: Right atrial size was normal in size. Pericardium: There is no evidence of  pericardial effusion. Mitral Valve: The mitral valve is normal in structure. Mild  mitral valve regurgitation. No evidence of mitral valve stenosis. Tricuspid Valve: The tricuspid valve is normal in structure. Tricuspid valve regurgitation is not demonstrated. No evidence of tricuspid stenosis. Aortic Valve: The aortic valve is calcified. Aortic valve regurgitation is not visualized. Mild to moderate aortic valve sclerosis/calcification is present, without any evidence of aortic stenosis. Pulmonic Valve: The pulmonic valve was normal in structure. Pulmonic valve regurgitation is not visualized. No evidence of pulmonic stenosis. Aorta: The aortic root is normal in size and structure. Venous: The inferior vena cava is normal in size with greater than 50% respiratory variability, suggesting right atrial pressure of 3 mmHg. IAS/Shunts: No atrial level shunt detected by color flow Doppler.  LEFT VENTRICLE PLAX 2D LVIDd:         4.00 cm      Diastology LVIDs:         3.40 cm      LV e' medial:    5.66 cm/s LV PW:         1.10 cm      LV E/e' medial:  15.2 LV IVS:        1.60 cm      LV e' lateral:   5.66 cm/s LVOT diam:     2.00 cm      LV E/e' lateral: 15.2 LV SV:         28 LV SV Index:   17 LVOT Area:     3.14 cm  LV Volumes (MOD) LV vol d, MOD A2C: 101.0 ml LV vol d, MOD A4C: 113.0 ml LV vol s, MOD A2C: 55.1 ml LV vol s, MOD A4C: 59.3 ml LV SV MOD A2C:     45.9 ml LV SV MOD A4C:     113.0 ml LV SV MOD BP:      50.1 ml RIGHT VENTRICLE RV S prime:     10.60 cm/s TAPSE (M-mode): 1.7 cm LEFT ATRIUM             Index       RIGHT ATRIUM           Index LA diam:        2.30 cm 1.42 cm/m  RA Area:     16.00 cm LA Vol (A2C):   36.8 ml 22.72 ml/m RA Volume:   42.40 ml  26.17 ml/m LA Vol (A4C):   43.7 ml 26.98 ml/m LA Biplane Vol: 41.4 ml 25.56 ml/m  AORTIC VALVE LVOT Vmax:   56.80 cm/s LVOT Vmean:  38.000 cm/s LVOT VTI:    0.088 m  AORTA Ao Root diam: 3.50 cm Ao Asc diam:  3.20 cm MITRAL VALVE MV Area (PHT): 4.71 cm     SHUNTS MV Decel Time: 161 msec    Systemic VTI:  0.09 m MV E velocity: 85.90 cm/s  Systemic Diam: 2.00 cm MV A velocity: 53.10 cm/s MV E/A ratio:  1.62 Candee Furbish MD Electronically signed by Candee Furbish MD Signature Date/Time: 05/02/2020/4:08:46 PM    Final    CT Angio Chest/Abd/Pel for Dissection W and/or Wo Contrast  Result Date: 05/01/2020 CLINICAL DATA:  85 year old male with chest and back pain. Concern for aortic dissection. EXAM: CT ANGIOGRAPHY CHEST, ABDOMEN AND PELVIS TECHNIQUE: Non-contrast CT of the chest was initially obtained. Multidetector CT imaging through the chest, abdomen and pelvis was performed using the standard protocol during bolus administration of intravenous contrast. Multiplanar reconstructed images and MIPs were obtained and reviewed to evaluate the vascular anatomy. CONTRAST:  16m OMNIPAQUE  IOHEXOL 350 MG/ML SOLN COMPARISON:  CT of the abdomen pelvis dated 11/11/2012. FINDINGS: Limited study due to paucity of intra-abdominal and mesenteric fat and cachexia as well as secondary to streak artifacts caused by overlying support wires. CTA CHEST FINDINGS Cardiovascular: There is no cardiomegaly or pericardial effusion. There is mild atherosclerotic calcification of the thoracic aorta. No aneurysmal dilatation or dissection. The origins of the great vessels of the aortic arch appear patent as visualized. The central pulmonary arteries are patent. Mediastinum/Nodes: No hilar or mediastinal adenopathy. The esophagus is grossly unremarkable. No mediastinal fluid collection Lungs/Pleura: Small bilateral pleural effusions with partial compressive atelectasis of the lower lobes versus pneumonia. Clinical correlation is recommended. There is background of emphysema. Bilateral upper lobe nodular densities most consistent with multifocal pneumonia. Clinical correlation and follow-up to resolution recommended. There is no pneumothorax. The central airways are patent. Musculoskeletal:  Degenerative changes of the spine. No acute osseous pathology. There is loss of subcutaneous fat and cachexia. Review of the MIP images confirms the above findings. CTA ABDOMEN AND PELVIS FINDINGS VASCULAR Aorta: There is moderate atherosclerotic calcification of the abdominal aorta. There is a 3.8 cm partially thrombosed infrarenal abdominal aortic aneurysm. Evaluation of the aneurysm is limited as precontrast images are not provided through the abdomen. There is however no contrast blush within the thrombosed portion of the aneurysm lumen. The no periaortic inflammation or fluid collection. No extraluminal contrast. No dissection. Celiac: Patent without evidence of aneurysm, dissection, vasculitis or significant stenosis. SMA: Patent without evidence of aneurysm, dissection, vasculitis or significant stenosis. Renals: The right renal artery is small and appears narrow, likely related to chronic scarring. There is diminished flow in the right renal artery. The left renal artery is patent. IMA: Patent without evidence of aneurysm, dissection, vasculitis or significant stenosis. Inflow: Mild atherosclerotic calcification. No aneurysmal dilatation or dissection. Veins: No obvious venous abnormality within the limitations of this arterial phase study. Review of the MIP images confirms the above findings. NON-VASCULAR6 No intra-abdominal free air or free fluid. Hepatobiliary: The liver and gallbladder are grossly unremarkable. Pancreas: Unremarkable. No pancreatic ductal dilatation or surrounding inflammatory changes. Spleen: Normal in size without focal abnormality. Adrenals/Urinary Tract: The adrenal glands unremarkable. Moderate right renal parenchyma atrophy. Nonobstructing right renal inferior pole calculus measures 10 mm. No hydronephrosis. The left kidney is unremarkable. The urinary bladder is grossly unremarkable Stomach/Bowel: There is no bowel obstruction or active inflammation. The appendix is poorly  visualized. Lymphatic: No adenopathy. Reproductive: The prostate is grossly unremarkable. Other: There is loss of subcutaneous fat and cachexia. Musculoskeletal: Osteopenia with degenerative changes of the spine. No acute osseous pathology. Review of the MIP images confirms the above findings. IMPRESSION: 1. No aortic dissection. 2. A 3.8 cm partially thrombosed infrarenal abdominal aortic aneurysm. No periaortic inflammation or fluid collection. Recommend follow-up ultrasound every 2 years. This recommendation follows ACR consensus guidelines: White Paper of the ACR Incidental Findings Committee II on Vascular Findings. J Am Coll Radiol 2013; 10:789-794. 3. Bilateral upper lobe nodular densities consistent with pneumonia. Clinical correlation and follow-up to resolution recommended. 4. Small bilateral pleural effusions with partial compressive atelectasis of the lower lobes versus pneumonia. Clinical correlation and follow-up to resolution recommended. 5. Aortic Atherosclerosis (ICD10-I70.0) and Emphysema (ICD10-J43.9). Electronically Signed   By: Anner Crete M.D.   On: 05/01/2020 22:04        Scheduled Meds: . enoxaparin (LOVENOX) injection  30 mg Subcutaneous Q24H  . insulin aspart  0-5 Units Subcutaneous QHS  . insulin aspart  0-9 Units Subcutaneous TID WC  . sodium chloride flush  3 mL Intravenous Q12H   Continuous Infusions: . sodium chloride    . azithromycin 250 mL/hr at 05/03/20 0824  . cefTRIAXone (ROCEPHIN)  IV 200 mL/hr at 05/03/20 0824     LOS: 2 days     Cordelia Poche, MD [facility] 05/03/2020, 9:11 AM  If 7PM-7AM, please contact night-coverage [email or web address]

## 2020-05-03 NOTE — Evaluation (Signed)
Occupational Therapy Evaluation Patient Details Name: Vincent Baxter MRN: [id number] DOB: 1935/03/21 Today's Date: 05/03/2020    History of Present Illness 85 y.o. male with medical history significant of HTN, kidney stones, DM.  Presents to the ED 05/01/20 with c/o R flank pain for past 2-3 days.  Radiation to R chest, associated SOB and BLE edema. X ray of ribs neg for rib pathology but does show findings worrisome for CHF. Admitted for treatment of new onset CHF and CAP   Clinical Impression   PTA, pt was living with alone and was independent with ADLs, IADLs, and driving. Currently, pt performing ADLs and functional mobility at Mod I - Independent level. Feel pt is close to baseline for ADLs. VSS on RA; HR 90s. Answered all pt questions. Recommend dc home once medically stable per physician. All acute OT needs met and will sign off. Thank you.    Follow Up Recommendations  No OT follow up;Supervision - Intermittent    Equipment Recommendations  None recommended by OT    Recommendations for Other Services PT consult     Precautions / Restrictions Precautions Precautions: None Restrictions Weight Bearing Restrictions: No      Mobility Bed Mobility Overal bed mobility: Modified Independent             General bed mobility comments: use of bed rail to pull over to EoB    Transfers Overall transfer level: Independent               General transfer comment: good power up and self steadying     Balance Overall balance assessment: Mild deficits observed, not formally tested                                         ADL either performed or assessed with clinical judgement   ADL Overall ADL's : Modified independent                                       General ADL Comments: Inceased time as needed. Pt performing toileting and grooming at sink.      Vision         Perception     Praxis      Pertinent Vitals/Pain Pain  Assessment: Faces Faces Pain Scale: Hurts a little bit Pain Location: R flank Pain Descriptors / Indicators: Aching;Sore Pain Intervention(s): Monitored during session;Limited activity within patient's tolerance;Repositioned     Hand Dominance Right   Extremity/Trunk Assessment Upper Extremity Assessment Upper Extremity Assessment: Overall WFL for tasks assessed   Lower Extremity Assessment Lower Extremity Assessment: Defer to PT evaluation   Cervical / Trunk Assessment Cervical / Trunk Assessment: Normal   Communication Communication Communication: No difficulties   Cognition Arousal/Alertness: Awake/alert Behavior During Therapy: WFL for tasks assessed/performed Overall Cognitive Status: Within Functional Limits for tasks assessed                                     General Comments  VSS on RA; HR 90s.     Exercises     Shoulder Instructions      Home Living Family/patient expects to be discharged to:: Private residence Living Arrangements: Alone Available Help at Discharge: Family;Available PRN/intermittently Type  of Home: House Home Access: Stairs to enter [employer] of Steps: 7   Home Layout: One level     Bathroom Shower/Tub: Teacher, early years/pre: Standard Bathroom Accessibility: Yes   Home Equipment: None   Additional Comments: Enjoys playing his keyboard and listening to his music      Prior Functioning/Environment Level of Independence: Independent        Comments: driving and completely independent        OT Problem List: Decreased strength;Decreased range of motion;Decreased activity tolerance;Impaired balance (sitting and/or standing);Decreased knowledge of use of DME or AE;Decreased knowledge of precautions      OT Treatment/Interventions:      OT Goals(Current goals can be found in the care plan section) Acute Rehab OT Goals Patient Stated Goal: go home before Thursday OT Goal Formulation:  All assessment and education complete, DC therapy  OT Frequency:     Barriers to D/C:            Co-evaluation              AM-PAC OT "6 Clicks" Daily Activity     Outcome Measure Help from another person eating meals?: None Help from another person taking care of personal grooming?: None Help from another person toileting, which includes using toliet, bedpan, or urinal?: None Help from another person bathing (including washing, rinsing, drying)?: None Help from another person to put on and taking off regular upper body clothing?: None Help from another person to put on and taking off regular lower body clothing?: None 6 Click Score: 24   End of Session Nurse Communication: Mobility status  Activity Tolerance: Patient tolerated treatment well Patient left: in bed;with call bell/phone within reach  OT Visit Diagnosis: Unsteadiness on feet (R26.81);Other abnormalities of gait and mobility (R26.89);Muscle weakness (generalized) (M62.81)                Time: 2505-3976 OT Time Calculation (min): 17 min Charges:  OT General Charges $OT Visit: 1 Visit OT Evaluation $OT Eval Low Complexity: 1 Low  Vannak Montenegro MSOT, OTR/L Acute Rehab Pager: [phone] Office: [place] 05/03/2020, 4:39 PM

## 2020-05-03 NOTE — Evaluation (Signed)
Physical Therapy Evaluation Patient Details Name: Vincent Baxter MRN: [id number] DOB: 02-20-1935 Today's Date: 05/03/2020   History of Present Illness  85 y.o. male with medical history significant of HTN, kidney stones, DM.  Presents to the ED 05/01/20 with c/o R flank pain for past 2-3 days.  Radiation to R chest, associated SOB and BLE edema. X ray of ribs neg for rib pathology but does show findings worrisome for CHF. Admitted for treatment of new onset CHF and CAP  Clinical Impression  PTA pt living alone in his family single story home with 7 steps to enter. Pt reports complete independence, driving regularly. Pt is currently mod I for bed mobility, independent for transfers and supervision for mild instability with ambulation without AD. Pt will not have any additional PT or equipment needs, however PT will continue to follow acutely to progress mobility and practice stairs before d/c home.       Follow Up Recommendations No PT follow up    Equipment Recommendations  None recommended by PT    Recommendations for Other Services       Precautions / Restrictions Precautions Precautions: None Restrictions Weight Bearing Restrictions: No      Mobility  Bed Mobility Overal bed mobility: Modified Independent             General bed mobility comments: use of bed rail to pull over to EoB    Transfers Overall transfer level: Independent               General transfer comment: good power up and self steadying   Ambulation/Gait Ambulation/Gait assistance: Supervision Gait Distance (Feet): 100 Feet Assistive device: None Gait Pattern/deviations: WFL(Within Functional Limits);Step-through pattern Gait velocity: WFL Gait velocity interpretation: >2.62 ft/sec, indicative of community ambulatory General Gait Details: supervision for safety with lines, slow, steady gait        Balance Overall balance assessment: Mild deficits observed, not formally tested                                            Pertinent Vitals/Pain Pain Assessment: Faces Faces Pain Scale: Hurts a little bit Pain Location: R flank Pain Descriptors / Indicators: Aching;Sore Pain Intervention(s): Limited activity within patient's tolerance;Monitored during session;Repositioned    Home Living Family/patient expects to be discharged to:: Private residence Living Arrangements: Alone Available Help at Discharge: Family;Available PRN/intermittently Type of Home: House Home Access: Stairs to enter   [employer] of Steps: 7 Home Layout: One level Home Equipment: None      Prior Function Level of Independence: Independent         Comments: driving and completely independent     Hand Dominance   Dominant Hand: Right    Extremity/Trunk Assessment   Upper Extremity Assessment Upper Extremity Assessment: Defer to OT evaluation    Lower Extremity Assessment Lower Extremity Assessment: Overall WFL for tasks assessed       Communication   Communication: No difficulties  Cognition Arousal/Alertness: Awake/alert Behavior During Therapy: WFL for tasks assessed/performed Overall Cognitive Status: Within Functional Limits for tasks assessed                                        General Comments General comments (skin integrity, edema, etc.): VSS on RA  Assessment/Plan    PT Assessment Patient needs continued PT services  PT Problem List Decreased strength       PT Treatment Interventions Stair training;Balance training    PT Goals (Current goals can be found in the Care Plan section)  Acute Rehab PT Goals Patient Stated Goal: go home before Thursday PT Goal Formulation: With patient Time For Goal Achievement: 05/17/20 Potential to Achieve Goals: Good    Frequency Min 3X/week    AM-PAC PT "6 Clicks" Mobility  Outcome Measure Help needed turning from your back to your side while in a flat bed  without using bedrails?: None Help needed moving from lying on your back to sitting on the side of a flat bed without using bedrails?: None Help needed moving to and from a bed to a chair (including a wheelchair)?: None Help needed standing up from a chair using your arms (e.g., wheelchair or bedside chair)?: None Help needed to walk in hospital room?: None Help needed climbing 3-5 steps with a railing? : A Little 6 Click Score: 23    End of Session   Activity Tolerance: Patient tolerated treatment well Patient left: in bed;with call bell/phone within reach Nurse Communication: Mobility status PT Visit Diagnosis: Muscle weakness (generalized) (M62.81)    Time: 6503-5465 PT Time Calculation (min) (ACUTE ONLY): 26 min   Charges:   PT Evaluation $PT Eval Moderate Complexity: 1 Mod PT Treatments $Gait Training: 8-22 mins        Karmen Altamirano B. Beverely Risen PT, DPT Acute Rehabilitation Services Pager [phone] Office [phone]   Elon Alas Fleet 05/03/2020, 3:05 PM

## 2020-05-03 NOTE — Progress Notes (Signed)
Initial Nutrition Assessment  RD working remotely.  DOCUMENTATION CODES:   Underweight, suspect malnutrition but unable to confirm without NFPE  INTERVENTION:   - Ensure Enlive po BID, each supplement provides 350 kcal and 20 grams of protein  - MVI with minerals daily  - Provided education regarding high-calorie, high-protein nutrition therapy  - Encourage adequate PO intake  NUTRITION DIAGNOSIS:   Increased nutrient needs related to chronic illness (CHF) as evidenced by estimated needs.  GOAL:   Patient will meet greater than or equal to 90% of their needs  MONITOR:   PO intake, Supplement acceptance, Labs, Weight trends, I & O's  REASON FOR ASSESSMENT:   Consult Assessment of nutrition requirement/status  ASSESSMENT:   85 year old male who presented to the ED on 11/14 with cough and SOB. PMH of HTN, HLD, T2DM. Pt admitted with new onset CHF and community acquired pneumonia.   Spoke with pt via phone call to room. Pt in good spirits and reports that he is going to be going home later today. Pt states that his appetite is okay but that he is not a "big eater." Pt reports that PTA he consumed 1 small meal daily consisting of poultry or fish with a starch and vegetable.  RD discussed with pt the importance of adequate kcal and protein intake in preventing weight loss and maintaining lean muscle mass. Pt expresses understanding and is willing to increase his PO intake. Pt also willing to try oral nutrition supplements. RD to order.  Pt reports that he was not aware that his weight was down to 110 lbs. Reviewed available weight history in chart. Last weight available PTA is from May 2014 (54.7 kg). Strongly suspect pt with some degree of malnutrition but unable to confirm without NFPE.  Medications reviewed and include: SSI, IV abx  Labs reviewed: BUN 45, creatinine 1.90 CBGs: 119-186 x 24 hours  UOP: 2150 ml x 24 hours I/O's: -1.3 L since admit  NUTRITION - FOCUSED  PHYSICAL EXAM:  Unable to complete at this time. RD working remotely.  Diet Order:   Diet Order            Diet Carb Modified Fluid consistency: Thin; Room service appropriate? Yes  Diet effective now                 EDUCATION NEEDS:   Education needs have been addressed  Skin:  Skin Assessment: Reviewed RN Assessment  Last BM:  no documented BM  Height:   Ht Readings from Last 1 Encounters:  05/01/20 5\' 5"  (1.651 m)    Weight:   Wt Readings from Last 1 Encounters:  05/03/20 50.3 kg    Ideal Body Weight:  61.8 kg  BMI:  Body mass index is 18.47 kg/m.  Estimated Nutritional Needs:   Kcal:  1650-1850  Protein:  75-85 grams  Fluid:  >/= 1.5 L    05/05/20, MS, RD, LDN Inpatient Clinical Dietitian Please see [facility] for contact information.

## 2020-05-03 NOTE — Progress Notes (Addendum)
Hospital Consult    Reason for Consult: Infrarenal abdominal aortic aneurysm Requesting Physician: [facility] MRN #:  [id number]  History of Present Illness: This is a 85 y.o. male admitted to the hospital with chest and back pain for cardiac rule out.  There was concern for aortic dissection and thus a CTA was performed which demonstrated a 3.8 cm infrarenal abdominal aortic aneurysm.  Patient was not previously aware that he had a AAA.  He denies any claudication, rest pain, or tissue changes including toe discoloration.  He is a smoker however states that after this admission he would be quitting.  He also will be discharged with an aspirin and a statin.  Past Medical History:  Diagnosis Date  . Diabetes mellitus 01/2009 dx  . DYSLIPIDEMIA   . GERD   . Hypertension   . Kidney stones   . RENAL CALCULUS 01/04/2009    Past Surgical History:  Procedure Laterality Date  . NO PAST SURGERIES      No Known Allergies  Prior to Admission medications   Medication Sig Start Date End Date Taking? Authorizing Provider  cyclobenzaprine (FLEXERIL) 5 MG tablet Take 0.5-1 tablets (2.5-5 mg total) by mouth 3 (three) times daily as needed for muscle spasms. Use lowest effective dose. Patient not taking: Reported on 05/01/2020 07/18/17   Ofilia Neas, PA-C  pioglitazone (ACTOS) 15 MG tablet Take 1 tablet (15 mg total) by mouth daily. Patient not taking: Reported on 05/01/2020 05/12/12   Newt Lukes, MD  pioglitazone (ACTOS) 30 MG tablet TAKE 1 TABLET (30 MG TOTAL) BY MOUTH DAILY. Patient not taking: Reported on 05/01/2020 02/14/13   Newt Lukes, MD  tamsulosin (FLOMAX) 0.4 MG CAPS capsule Take 1 capsule (0.4 mg total) by mouth daily after supper. Patient not taking: Reported on 05/01/2020 07/29/19   Wallis Bamberg, PA-C  tiZANidine (ZANAFLEX) 4 MG tablet Take 1 tablet (4 mg total) by mouth at bedtime as needed for muscle spasms. Patient not taking: Reported on 05/01/2020 07/29/19   Wallis Bamberg, PA-C  traMADol (ULTRAM) 50 MG tablet Take 1 tablet (50 mg total) by mouth every 8 (eight) hours as needed for severe pain. Patient not taking: Reported on 05/01/2020 07/29/19   Wallis Bamberg, PA-C  furosemide (LASIX) 20 MG tablet Take 1 tablet (20 mg total) by mouth daily for 3 days. 05/01/20 05/01/20  Mickie Bail, NP    Social History   Socioeconomic History  . Marital status: Widowed    Spouse name: Not on file  . Number of children: Not on file  . Years of education: Not on file  . Highest education level: Not on file  Occupational History  . Not on file  Tobacco Use  . Smoking status: Current Every Day Smoker    Packs/day: 0.50    Types: Cigarettes  . Smokeless tobacco: Never Used  . 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 Vaping Use: Never used  Substance and Sexual Activity  . Alcohol use: No  . Drug use: No  . Sexual activity: Not on file  Other Topics Concern  . Not on file  Social History Narrative  . Not on file   Social Determinants of Health   Financial Resource Strain:   . Difficulty of Paying Living Expenses: Not on file  Food Insecurity:   . Worried About Programme researcher, broadcasting/film/video in the Last Year: Not on file  . Ran Out of Food in  the Last Year: Not on file  Transportation Needs:   . Lack of Transportation (Medical): Not on file  . Lack of Transportation (Non-Medical): Not on file  Physical Activity:   . Days of Exercise per Week: Not on file  . Minutes of Exercise per Session: Not on file  Stress:   . Feeling of Stress : Not on file  Social Connections:   . Frequency of Communication with Friends and Family: Not on file  . Frequency of Social Gatherings with Friends and Family: Not on file  . Attends Religious Services: Not on file  . Active Member of Clubs or Organizations: Not on file  . Attends Archivist Meetings: Not on file  . Marital Status: Not on file  Intimate Partner Violence:   . Fear of  Current or Ex-Partner: Not on file  . Emotionally Abused: Not on file  . Physically Abused: Not on file  . Sexually Abused: Not on file     Family History  Problem Relation Age of Onset  . Hypertension Mother     ROS: Otherwise negative unless mentioned in HPI  Physical Examination  Vitals:   05/03/20 1200 05/03/20 1224  BP:    Pulse:  84  Resp: 11   Temp:    SpO2: 95%    Body mass index is 18.47 kg/m.  General:  WDWN in NAD Gait: Not observed HENT: WNL, normocephalic Pulmonary: normal non-labored breathing, without Rales, rhonchi,  wheezing Cardiac: regular Abdomen:  soft, NT/ND, no masses Skin: without rashes Vascular Exam/Pulses: Feet are warm and well-perfused Extremities: without ischemic changes, without Gangrene , without cellulitis; without open wounds;  Musculoskeletal: no muscle wasting or atrophy  Neurologic: A&O X 3;  No focal weakness or paresthesias are detected; speech is fluent/normal Psychiatric:  The pt has Normal affect. Lymph:  Unremarkable  CBC    Component Value Date/Time   WBC 13.7 (H) 05/02/2020 0307   RBC 3.43 (L) 05/02/2020 0307   HGB 11.3 (L) 05/02/2020 0307   HCT 32.3 (L) 05/02/2020 0307   PLT 175 05/02/2020 0307   MCV 94.2 05/02/2020 0307   MCH 32.9 05/02/2020 0307   MCHC 35.0 05/02/2020 0307   RDW 13.7 05/02/2020 0307   LYMPHSABS 1.6 02/11/2012 0931   MONOABS 0.6 02/11/2012 0931   EOSABS 0.3 02/11/2012 0931   BASOSABS 0.1 02/11/2012 0931    BMET    Component Value Date/Time   NA 140 05/03/2020 0553   K 4.4 05/03/2020 0553   CL 104 05/03/2020 0553   CO2 24 05/03/2020 0553   GLUCOSE 161 (H) 05/03/2020 0553   BUN 45 (H) 05/03/2020 0553   CREATININE 1.90 (H) 05/03/2020 0553   CALCIUM 8.9 05/03/2020 0553   GFRNONAA 35 (L) 05/03/2020 0553   GFRAA  12/12/2009 2237    >60        The eGFR has been calculated using the MDRD equation. This calculation has not been validated in all clinical situations. eGFR's  persistently <60 mL/min signify possible Chronic Kidney Disease.    COAGS: No results found for: INR, PROTIME   Non-Invasive Vascular Imaging:   CTA demonstrates 3.8 cm abdominal aortic aneurysm infrarenal   ASSESSMENT/PLAN: This is a 85 y.o. male with 3.8 cm infrarenal abdominal aortic aneurysm  Incidental finding of abdominal aortic aneurysm on CTA No indication for repair or further work-up at this time Plan will be for reevaluation with ultrasound in 1 year which our office will arrange Agree with aspirin and statin  at discharge Encouraged smoking cessation Okay for discharge from vascular surgery standpoint   Dagoberto Ligas PA-C [facility] [phone]  I have examined the patient, reviewed and agree with above.  Very pleasant 85 year old gentleman.  Incidental finding of 3.8 cm infrarenal abdominal aortic aneurysm with CT scan.  I discussed the significance of this in detail with the patient.  Explained that he has no restrictions in activity from my standpoint.  We will arrange 1 year follow-up with ultrasound and office visit  Curt Jews, MD 05/03/2020 4:30 PM

## 2020-05-03 NOTE — Progress Notes (Signed)
Progress Note  Patient Name: Vincent Baxter Date of Encounter: 05/03/2020  Attending physician: Narda Bonds, MD Primary care provider: Patient, No Pcp Per Primary Cardiologist: NA Consultant:Bailyn Spackman Odis Hollingshead DO, [facility]  Subjective: Vincent Baxter is a 85 y.o. male who was seen and examined at bedside at approximately 945am Overnight events reviewed. He had EKGs performed yesterday night which I independently reviewed during morning rounds. Telemetry notes one episode of nonsustained ventricular tachycardia that occurred earlier this morning. He denies anginal chest pain or shortness of breath. Lower extremity swelling has resolved. Patient does have nonspecific jaw pain which he thinks is secondary to dental caries and the discomfort improved after receiving Tylenol.  No recurrence of episodes. Case discussed and reviewed with his nurse.  Objective: Vital Signs in the last 24 hours: Temp:  [97.6 F (36.4 C)-98.3 F (36.8 C)] 98.1 F (36.7 C) (11/16 1133) Pulse Rate:  [63-92] 78 (11/16 1133) Resp:  [12-25] 15 (11/16 1133) BP: (102-144)/(58-99) 119/58 (11/16 1133) SpO2:  [94 %-100 %] 95 % (11/16 1133) Weight:  [50.3 kg] 50.3 kg (11/16 0457)  Intake/Output:  Intake/Output Summary (Last 24 hours) at 05/03/2020 1209 Last data filed at 05/03/2020 0824 Gross per 24 hour  Intake 403 ml  Output 1150 ml  Net -747 ml    Net IO Since Admission: -1,397 mL [05/03/20 1209]  Weights:  Filed Weights   05/01/20 2321 05/03/20 0457  Weight: 56.7 kg 50.3 kg    Telemetry: Personally reviewed.  Predominant normal sinus rhythm.  1 episode of asymptomatic nonsustained ventricular tachycardia earlier today.  Physical examination: PHYSICAL EXAM: Vitals with BMI 05/03/2020 05/03/2020 05/03/2020  Height - - -  Weight - - -  BMI - - -  Systolic 119 121 324  Diastolic 58 82 84  Pulse 78 - -    CONSTITUTIONAL: Well-developed and well-nourished. No acute distress.  SKIN: Skin is warm and  dry. No rash noted. No cyanosis. No pallor. No jaundice HEAD: Normocephalic and atraumatic.  EYES: No scleral icterus MOUTH/THROAT: Moist oral membranes.  NECK: No JVD present. No thyromegaly noted. No carotid bruits  LYMPHATIC: No visible cervical adenopathy.  CHEST Normal respiratory effort. No intercostal retractions  LUNGS: Clear to auscultation bilaterally no stridor. No wheezes. No rales.  CARDIOVASCULAR: Regular, positive S1-S2, no murmurs rubs or gallops appreciated. ABDOMINAL: Soft, nontender, nondistended, positive bowel sounds in all 4 quadrants, no apparent ascites.  EXTREMITIES: No peripheral edema, warm to touch bilaterally. HEMATOLOGIC: No significant bruising NEUROLOGIC: Oriented to person, place, and time. Nonfocal. Normal muscle tone.  PSYCHIATRIC: Normal mood and affect. Normal behavior. Cooperative  Lab Results: Hematology Recent Labs  Lab 05/01/20 1455 05/02/20 0307  WBC 15.0* 13.7*  RBC 3.66* 3.43*  HGB 11.9* 11.3*  HCT 36.9* 32.3*  MCV 100.8* 94.2  MCH 32.5 32.9  MCHC 32.2 35.0  RDW 14.2 13.7  PLT 181 175    Chemistry Recent Labs  Lab 05/01/20 1455 05/01/20 1455 05/01/20 1949 05/02/20 0307 05/02/20 2253 05/03/20 0553  NA 143  --   --  141  --  140  K 4.3   < >  --  3.8 4.2 4.4  CL 111  --   --  109  --  104  CO2 20*  --   --  23  --  24  GLUCOSE 182*  --   --  150*  --  161*  BUN 20  --   --  26*  --  45*  CREATININE 1.47*  --   --  1.61*  --  1.90*  CALCIUM 9.1  --   --  9.1  --  8.9  PROT  --   --  6.3*  --   --   --   ALBUMIN  --   --  3.4*  --   --   --   AST  --   --  27  --   --   --   ALT  --   --  92*  --   --   --   ALKPHOS  --   --  104  --   --   --   BILITOT  --   --  1.2  --   --   --   GFRNONAA 47*  --   --  42*  --  35*  ANIONGAP 12  --   --  9  --  12   < > = values in this interval not displayed.     Cardiac Enzymes: Cardiac Panel (last 3 results) Recent Labs    05/01/20 1455 05/01/20 1835  TROPONINIHS 306* 303*     BNP (last 3 results) Recent Labs    05/01/20 1836  BNP 916.5*    ProBNP (last 3 results) No results for input(s): PROBNP in the last 8760 hours.   DDimer  Recent Labs  Lab 05/01/20 1949  DDIMER 2.03*     Hemoglobin A1c:  Lab Results  Component Value Date   HGBA1C 6.5 (H) 05/02/2020   MPG 139.85 05/02/2020    TSH  Recent Labs    05/01/20 2206  TSH 0.845    Lipid Panel     Component Value Date/Time   CHOL 151 02/11/2012 0931   TRIG 106.0 02/11/2012 0931   HDL 58.50 02/11/2012 0931   CHOLHDL 3 02/11/2012 0931   VLDL 21.2 02/11/2012 0931   LDLCALC 71 02/11/2012 0931   LDLDIRECT 117.7 01/18/2009 1047    Imaging: DG Ribs Unilateral W/Chest Right  Result Date: 05/01/2020 CLINICAL DATA:  Rib pain and shortness of breath. EXAM: RIGHT RIBS AND CHEST - 3+ VIEW COMPARISON:  Chest x-ray on 11/14/2011 FINDINGS: Lungs demonstrate diffuse bilateral perihilar edema likely consistent with congestive heart failure. There are associated small bilateral pleural effusions. No airspace consolidation or pneumothorax. The heart size is within normal limits. Right ribs demonstrate no evidence of fracture or bony lesion. IMPRESSION: Findings likely representing congestive heart failure with small bilateral pleural effusions. Electronically Signed   By: Irish Lack M.D.   On: 05/01/2020 13:44   DG Lumbar Spine 2-3 Views  Result Date: 05/03/2020 CLINICAL DATA:  85 year old male with a history of pain in the lower back EXAM: LUMBAR SPINE - 2-3 VIEW COMPARISON:  07/18/2017 FINDINGS: Lumbar Spine: Lumbar vertebral elements maintain normal alignment without evidence of anterolisthesis, retrolisthesis, subluxation. No acute fracture line identified. Vertebral body heights maintained. Early endplate changes without significant disc space narrowing. Facet hypertrophy developing at L4-L5 and L5-S1. Calcification within the right abdomen over the right renal silhouette, compatible with  right-sided nephrolithiasis IMPRESSION: Negative for acute fracture or malalignment of the lumbar spine. Unchanged appearance of right-sided renal calculus. Electronically Signed   By: Gilmer Mor D.O.   On: 05/03/2020 09:57   ECHOCARDIOGRAM COMPLETE  Result Date: 05/02/2020    ECHOCARDIOGRAM REPORT   Patient Name:   Vincent Baxter Date of Exam: 05/02/2020 Medical Rec #:  [id number]      Height:       65.0 in Accession #:  [id number]     Weight:       125.0 lb Date of Birth:  Jun 30, 1934     BSA:          1.620 m Patient Age:    84 years       BP:           129/81 mmHg Patient Gender: M              HR:           91 bpm. Exam Location:  Inpatient Procedure: 2D Echo Indications:    Dyspnea 786.09 / R06.00  History:        Patient has no prior history of Echocardiogram examinations.                 Risk Factors:Diabetes, Hypertension and Dyslipidemia. AAA.                 Pneumonia.  Sonographer:    Leta Jungling RDCS Referring Phys: [id number] Roshanna Cimino IMPRESSIONS  1. Left ventricular ejection fraction, by estimation, is 50 to 55%. The left ventricle has low normal function. The left ventricle has no regional wall motion abnormalities. There is moderate left ventricular hypertrophy. Left ventricular diastolic parameters are consistent with Grade II diastolic dysfunction (pseudonormalization).  2. Right ventricular systolic function is normal. The right ventricular size is normal.  3. The mitral valve is normal in structure. Mild mitral valve regurgitation. No evidence of mitral stenosis.  4. The aortic valve is calcified. Aortic valve regurgitation is not visualized. Mild to moderate aortic valve sclerosis/calcification is present, without any evidence of aortic stenosis.  5. The inferior vena cava is normal in size with greater than 50% respiratory variability, suggesting right atrial pressure of 3 mmHg. FINDINGS  Left Ventricle: Left ventricular ejection fraction, by estimation, is 50 to 55%. The left  ventricle has low normal function. The left ventricle has no regional wall motion abnormalities. The left ventricular internal cavity size was normal in size. There is moderate left ventricular hypertrophy. Left ventricular diastolic parameters are consistent with Grade II diastolic dysfunction (pseudonormalization). Right Ventricle: The right ventricular size is normal. No increase in right ventricular wall thickness. Right ventricular systolic function is normal. Left Atrium: Left atrial size was normal in size. Right Atrium: Right atrial size was normal in size. Pericardium: There is no evidence of pericardial effusion. Mitral Valve: The mitral valve is normal in structure. Mild mitral valve regurgitation. No evidence of mitral valve stenosis. Tricuspid Valve: The tricuspid valve is normal in structure. Tricuspid valve regurgitation is not demonstrated. No evidence of tricuspid stenosis. Aortic Valve: The aortic valve is calcified. Aortic valve regurgitation is not visualized. Mild to moderate aortic valve sclerosis/calcification is present, without any evidence of aortic stenosis. Pulmonic Valve: The pulmonic valve was normal in structure. Pulmonic valve regurgitation is not visualized. No evidence of pulmonic stenosis. Aorta: The aortic root is normal in size and structure. Venous: The inferior vena cava is normal in size with greater than 50% respiratory variability, suggesting right atrial pressure of 3 mmHg. IAS/Shunts: No atrial level shunt detected by color flow Doppler.  LEFT VENTRICLE PLAX 2D LVIDd:         4.00 cm      Diastology LVIDs:         3.40 cm      LV e' medial:    5.66 cm/s LV PW:         1.10 cm  LV E/e' medial:  15.2 LV IVS:        1.60 cm      LV e' lateral:   5.66 cm/s LVOT diam:     2.00 cm      LV E/e' lateral: 15.2 LV SV:         28 LV SV Index:   17 LVOT Area:     3.14 cm  LV Volumes (MOD) LV vol d, MOD A2C: 101.0 ml LV vol d, MOD A4C: 113.0 ml LV vol s, MOD A2C: 55.1 ml LV vol s,  MOD A4C: 59.3 ml LV SV MOD A2C:     45.9 ml LV SV MOD A4C:     113.0 ml LV SV MOD BP:      50.1 ml RIGHT VENTRICLE RV S prime:     10.60 cm/s TAPSE (M-mode): 1.7 cm LEFT ATRIUM             Index       RIGHT ATRIUM           Index LA diam:        2.30 cm 1.42 cm/m  RA Area:     16.00 cm LA Vol (A2C):   36.8 ml 22.72 ml/m RA Volume:   42.40 ml  26.17 ml/m LA Vol (A4C):   43.7 ml 26.98 ml/m LA Biplane Vol: 41.4 ml 25.56 ml/m  AORTIC VALVE LVOT Vmax:   56.80 cm/s LVOT Vmean:  38.000 cm/s LVOT VTI:    0.088 m  AORTA Ao Root diam: 3.50 cm Ao Asc diam:  3.20 cm MITRAL VALVE MV Area (PHT): 4.71 cm    SHUNTS MV Decel Time: 161 msec    Systemic VTI:  0.09 m MV E velocity: 85.90 cm/s  Systemic Diam: 2.00 cm MV A velocity: 53.10 cm/s MV E/A ratio:  1.62 Donato Schultz MD Electronically signed by Donato Schultz MD Signature Date/Time: 05/02/2020/4:08:46 PM    Final    CT Angio Chest/Abd/Pel for Dissection W and/or Wo Contrast  Result Date: 05/01/2020 CLINICAL DATA:  85 year old male with chest and back pain. Concern for aortic dissection. EXAM: CT ANGIOGRAPHY CHEST, ABDOMEN AND PELVIS TECHNIQUE: Non-contrast CT of the chest was initially obtained. Multidetector CT imaging through the chest, abdomen and pelvis was performed using the standard protocol during bolus administration of intravenous contrast. Multiplanar reconstructed images and MIPs were obtained and reviewed to evaluate the vascular anatomy. CONTRAST:  80mL OMNIPAQUE IOHEXOL 350 MG/ML SOLN COMPARISON:  CT of the abdomen pelvis dated 11/11/2012. FINDINGS: Limited study due to paucity of intra-abdominal and mesenteric fat and cachexia as well as secondary to streak artifacts caused by overlying support wires. CTA CHEST FINDINGS Cardiovascular: There is no cardiomegaly or pericardial effusion. There is mild atherosclerotic calcification of the thoracic aorta. No aneurysmal dilatation or dissection. The origins of the great vessels of the aortic arch appear patent  as visualized. The central pulmonary arteries are patent. Mediastinum/Nodes: No hilar or mediastinal adenopathy. The esophagus is grossly unremarkable. No mediastinal fluid collection Lungs/Pleura: Small bilateral pleural effusions with partial compressive atelectasis of the lower lobes versus pneumonia. Clinical correlation is recommended. There is background of emphysema. Bilateral upper lobe nodular densities most consistent with multifocal pneumonia. Clinical correlation and follow-up to resolution recommended. There is no pneumothorax. The central airways are patent. Musculoskeletal: Degenerative changes of the spine. No acute osseous pathology. There is loss of subcutaneous fat and cachexia. Review of the MIP images confirms the above findings. CTA ABDOMEN AND PELVIS FINDINGS  VASCULAR Aorta: There is moderate atherosclerotic calcification of the abdominal aorta. There is a 3.8 cm partially thrombosed infrarenal abdominal aortic aneurysm. Evaluation of the aneurysm is limited as precontrast images are not provided through the abdomen. There is however no contrast blush within the thrombosed portion of the aneurysm lumen. The no periaortic inflammation or fluid collection. No extraluminal contrast. No dissection. Celiac: Patent without evidence of aneurysm, dissection, vasculitis or significant stenosis. SMA: Patent without evidence of aneurysm, dissection, vasculitis or significant stenosis. Renals: The right renal artery is small and appears narrow, likely related to chronic scarring. There is diminished flow in the right renal artery. The left renal artery is patent. IMA: Patent without evidence of aneurysm, dissection, vasculitis or significant stenosis. Inflow: Mild atherosclerotic calcification. No aneurysmal dilatation or dissection. Veins: No obvious venous abnormality within the limitations of this arterial phase study. Review of the MIP images confirms the above findings. NON-VASCULAR6 No  intra-abdominal free air or free fluid. Hepatobiliary: The liver and gallbladder are grossly unremarkable. Pancreas: Unremarkable. No pancreatic ductal dilatation or surrounding inflammatory changes. Spleen: Normal in size without focal abnormality. Adrenals/Urinary Tract: The adrenal glands unremarkable. Moderate right renal parenchyma atrophy. Nonobstructing right renal inferior pole calculus measures 10 mm. No hydronephrosis. The left kidney is unremarkable. The urinary bladder is grossly unremarkable Stomach/Bowel: There is no bowel obstruction or active inflammation. The appendix is poorly visualized. Lymphatic: No adenopathy. Reproductive: The prostate is grossly unremarkable. Other: There is loss of subcutaneous fat and cachexia. Musculoskeletal: Osteopenia with degenerative changes of the spine. No acute osseous pathology. Review of the MIP images confirms the above findings. IMPRESSION: 1. No aortic dissection. 2. A 3.8 cm partially thrombosed infrarenal abdominal aortic aneurysm. No periaortic inflammation or fluid collection. Recommend follow-up ultrasound every 2 years. This recommendation follows ACR consensus guidelines: White Paper of the ACR Incidental Findings Committee II on Vascular Findings. J Am Coll Radiol 2013; 10:789-794. 3. Bilateral upper lobe nodular densities consistent with pneumonia. Clinical correlation and follow-up to resolution recommended. 4. Small bilateral pleural effusions with partial compressive atelectasis of the lower lobes versus pneumonia. Clinical correlation and follow-up to resolution recommended. 5. Aortic Atherosclerosis (ICD10-I70.0) and Emphysema (ICD10-J43.9). Electronically Signed   By: Elgie Collard M.D.   On: 05/01/2020 22:04    Cardiac database: EKG: 05/01/2020: Sinus tachycardia, 100 bpm, normal axis, poor R wave progression, consider old anterior infarct, without underlying injury pattern.  05/02/2020 2158: Normal sinus rhythm, 90 bpm, with  multifocal PACs.  05/03/2020: Normal sinus rhythm, 80 bpm, with rare PACs and PVCs.  Echocardiogram: 05/02/2020 1. Left ventricular ejection fraction, by estimation, is 50 to 55%. The left ventricle has low normal function. The left ventricle has no regional wall motion abnormalities. There is moderate left ventricular hypertrophy. Left ventricular diastolic parameters are consistent with Grade II diastolic dysfunction (pseudonormalization). 2. Right ventricular systolic function is normal. The right ventricular size is normal. 3. The mitral valve is normal in structure. Mild mitral valve regurgitation. No evidence of mitral stenosis. 4. The aortic valve is calcified. Aortic valve regurgitation is not visualized. Mild to moderate aortic valve sclerosis/calcification is present, without any evidence of aortic stenosis. 5. The inferior vena cava is normal in size with greater than 50% respiratory variability, suggesting right atrial pressure of 3 mmHg.   Scheduled Meds: . enoxaparin (LOVENOX) injection  30 mg Subcutaneous Q24H  . feeding supplement  237 mL Oral BID BM  . insulin aspart  0-5 Units Subcutaneous QHS  . insulin aspart  0-9  Units Subcutaneous TID WC  . metoprolol tartrate  25 mg Oral BID  . multivitamin with minerals  1 tablet Oral Daily  . sodium chloride flush  3 mL Intravenous Q12H    Continuous Infusions: . sodium chloride    . azithromycin 250 mL/hr at 05/03/20 0824  . cefTRIAXone (ROCEPHIN)  IV 200 mL/hr at 05/03/20 0824    PRN Meds: sodium chloride, acetaminophen, ondansetron (ZOFRAN) IV, sodium chloride flush   IMPRESSION & RECOMMENDATIONS: Vincent Baxter is a 85 y.o. male whose past medical history and cardiac risk factors include: Diabetes, hyperlipidemia, hypertension, history of peptic ulcer disease, active smoker, infrarenal abdominal aortic aneurysm, advanced age.  Elevated troponins: Cardiology was consulted for elevated troponins noted on admission.   Troponin elevation that was predominantly flat and not the usual rise and fall that is seen in ACS.  The elevated troponin was most likely secondary to type II supply demand ischemia given his tachycardic and hypertensive blood pressures on admission and underlying SIRS criteria.  Since admission patient's blood pressure has improved and he has been diagnosed with pneumonia and currently getting antibiotics.  Echocardiogram noted low normal LVEF with diastolic dysfunction.  No regional wall motion abnormalities noted. Discussed undergoing stress test as outpatient once his pneumonia is resolved and heart rate is better controlled.  Outpatient office visit has been scheduled for May 24, 2020 at 2:15 PM.  Abnormal EKG: Patient had multiple EKGs performed yesterday which were independently reviewed by myself and discussed with my colleagues.  The underlying rhythm shows sinus mechanism with frequent PACs.  Clinically, I do not think he has second-degree AV block. He had one asymptomatic episode of NSVT on telemetry, and PACs/PVCs on ECGs recommend reinitiation of Lopressor 25 mg p.o. twice daily. Patient would benefit from outpatient Holter monitor to further evaluate the burden of PACs and PVCs and heart blocks.  Infrarenal abdominal aortic aneurysm: Noted on the CT angio chest abdomen pelvis performed on 05/01/2020. Recommend aspirin and statin therapy. May consider vascular evaluation prior to discharge to make sure no additional work-up is needed and that he has appropriate follow-up once discharged.  Will defer to primary team at this time.   Secondary diagnoses: Community-acquired pneumonia: Currently on antibiotics managed by primary team. Leukocytosis: Currently managed by primary team. Diabetes: Currently managed by primary team. Hypertension: Well controlled.  Currently managed per primary team.  Continue blood pressure medications. Active smoker: Educated on the importance of complete  smoking cessation.  For now we will follow patient on as needed basis.  Please reach out to us if any questions or concerns arise during his hospitalization.  I have scheduled him for an office visit in 2 weeks on May 24, 2020 at 2:15 PM.  Total time spent: 35 minutes of which greater than 50% of time was spent face-to-face, reviewing records overnight, reviewing diagnostic results, reviewing telemetry, coordination of care, discussing disease management.   Patient's questions and concerns were addressed to his satisfaction. He voices understanding of the instructions provided during this encounter.   This note was created using a voice recognition software as a result there may be grammatical errors inadvertently enclosed that do not reflect the nature of this encounter. Every attempt is made to correct such errors.  Tessa LernerSunit Brandn Mcgath, DO, [facility] Office: [phone] 05/03/2020, 12:09 PM

## 2020-05-03 NOTE — Plan of Care (Signed)

## 2020-05-04 ENCOUNTER — Telehealth: Payer: Self-pay

## 2020-05-04 ENCOUNTER — Telehealth: Payer: Self-pay | Admitting: Cardiology

## 2020-05-04 DIAGNOSIS — F17200 Nicotine dependence, unspecified, uncomplicated: Secondary | ICD-10-CM

## 2020-05-04 DIAGNOSIS — I491 Atrial premature depolarization: Secondary | ICD-10-CM

## 2020-05-04 LAB — GLUCOSE, CAPILLARY: Glucose-Capillary: 209 mg/dL — ABNORMAL HIGH (ref 70–99)

## 2020-05-04 LAB — CBC
HCT: 32.2 % — ABNORMAL LOW (ref 39.0–52.0)
Hemoglobin: 11.4 g/dL — ABNORMAL LOW (ref 13.0–17.0)
MCH: 33.4 pg (ref 26.0–34.0)
MCHC: 35.4 g/dL (ref 30.0–36.0)
MCV: 94.4 fL (ref 80.0–100.0)
Platelets: 193 10*3/uL (ref 150–400)
RBC: 3.41 MIL/uL — ABNORMAL LOW (ref 4.22–5.81)
RDW: 13.6 % (ref 11.5–15.5)
WBC: 9 10*3/uL (ref 4.0–10.5)
nRBC: 0 % (ref 0.0–0.2)

## 2020-05-04 LAB — BASIC METABOLIC PANEL
Anion gap: 7 (ref 5–15)
BUN: 52 mg/dL — ABNORMAL HIGH (ref 8–23)
CO2: 26 mmol/L (ref 22–32)
Calcium: 8.8 mg/dL — ABNORMAL LOW (ref 8.9–10.3)
Chloride: 105 mmol/L (ref 98–111)
Creatinine, Ser: 1.87 mg/dL — ABNORMAL HIGH (ref 0.61–1.24)
GFR, Estimated: 35 mL/min — ABNORMAL LOW (ref >60)
Glucose, Bld: 90 mg/dL (ref 70–99)
Potassium: 4.2 mmol/L (ref 3.5–5.1)
Sodium: 138 mmol/L (ref 135–145)

## 2020-05-04 LAB — VITAMIN B6

## 2020-05-04 MED ORDER — METOPROLOL TARTRATE 25 MG PO TABS: 25.0000 mg | ORAL_TABLET | Freq: Two times a day (BID) | ORAL | 0 refills | Status: DC

## 2020-05-04 MED ORDER — ASPIRIN 81 MG PO TBEC
81.0000 mg | DELAYED_RELEASE_TABLET | Freq: Every day | ORAL | 11 refills | Status: DC
Start: 2020-05-05 — End: 2022-03-25

## 2020-05-04 MED ORDER — AMOXICILLIN-POT CLAVULANATE 500-125 MG PO TABS: 1.0000 | ORAL_TABLET | Freq: Three times a day (TID) | ORAL | 0 refills | Status: AC

## 2020-05-04 MED ORDER — ATORVASTATIN CALCIUM 20 MG PO TABS: 20.0000 mg | ORAL_TABLET | Freq: Every day | ORAL | 0 refills | Status: DC

## 2020-05-04 MED ORDER — AMOXICILLIN-POT CLAVULANATE 875-125 MG PO TABS: 1.0000 | ORAL_TABLET | Freq: Two times a day (BID) | ORAL | Status: DC

## 2020-05-04 MED ORDER — FUROSEMIDE 20 MG PO TABS: 20.0000 mg | ORAL_TABLET | Freq: Every day | ORAL | 0 refills | Status: DC | PRN

## 2020-05-04 MED ORDER — AZITHROMYCIN 500 MG PO TABS: 500.0000 mg | ORAL_TABLET | Freq: Every day | ORAL | Status: DC

## 2020-05-04 MED ORDER — AMOXICILLIN-POT CLAVULANATE 875-125 MG PO TABS: 1.0000 | ORAL_TABLET | Freq: Two times a day (BID) | ORAL | 0 refills | Status: DC

## 2020-05-04 NOTE — Progress Notes (Signed)
Physical Therapy Treatment Patient Details Name: ABBY STINES MRN: [id number] DOB: Oct 30, 1934 Today's Date: 05/04/2020    History of Present Illness 85 y.o. male with medical history significant of HTN, kidney stones, DM.  Presents to the ED 05/01/20 with c/o R flank pain for past 2-3 days.  Radiation to R chest, associated SOB and BLE edema. X ray of ribs neg for rib pathology but does show findings worrisome for CHF. Admitted for treatment of new onset CHF and CAP    PT Comments    Pt frustrated that he has not yet been seen by his physician and he is upset that he has not discharged as he has "things I need to do." Pt is agreeable to complete stair training but he states he is leaving if his doctor is not there when he gets back. Pt is mod I for ambulation of 200 feet without AD and min guard for ascent/descent of 10 steps with use of rail. Pt RN in room when he returned and doctor entering at end of session. D/c plans remain appropriate.     Follow Up Recommendations  No PT follow up     Equipment Recommendations  None recommended by PT    Recommendations for Other Services       Precautions / Restrictions Precautions Precautions: None Restrictions Weight Bearing Restrictions: No    Mobility  Transfers                 General transfer comment: standing at sink brushing teeth on entry   Ambulation/Gait Ambulation/Gait assistance: Modified independent (Device/Increase time) Gait Distance (Feet): 200 Feet Assistive device: None Gait Pattern/deviations: WFL(Within Functional Limits);Step-through pattern Gait velocity: WFL Gait velocity interpretation: >2.62 ft/sec, indicative of community ambulatory General Gait Details: slow, steady gait   Stairs Stairs: Yes Stairs assistance: Min guard Stair Management: One rail Left;Forwards;Alternating pattern Number of Stairs: 10 General stair comments: min guard for safety with ascent/descent of 10 steps, HR increased  to low 90s and back to high 70s with standing rest break at the top and the bottom of the stairs         Balance Overall balance assessment: Mild deficits observed, not formally tested                                          Cognition Arousal/Alertness: Awake/alert Behavior During Therapy: Agitated Overall Cognitive Status: Within Functional Limits for tasks assessed                                 General Comments: Pt is upset that the hospitalist has not been by to discharge him yet today. MD has been called.       Exercises      General Comments General comments (skin integrity, edema, etc.): VSS on RA      Pertinent Vitals/Pain Pain Assessment: No/denies pain           PT Goals (current goals can now be found in the care plan section) Acute Rehab PT Goals Patient Stated Goal: go home before Thursday PT Goal Formulation: With patient Time For Goal Achievement: 05/17/20 Potential to Achieve Goals: Good Progress towards PT goals: Progressing toward goals    Frequency    Min 3X/week      PT Plan Current plan remains appropriate  AM-PAC PT "6 Clicks" Mobility   Outcome Measure  Help needed turning from your back to your side while in a flat bed without using bedrails?: None Help needed moving from lying on your back to sitting on the side of a flat bed without using bedrails?: None Help needed moving to and from a bed to a chair (including a wheelchair)?: None Help needed standing up from a chair using your arms (e.g., wheelchair or bedside chair)?: None Help needed to walk in hospital room?: None Help needed climbing 3-5 steps with a railing? : None 6 Click Score: 24    End of Session   Activity Tolerance: Patient tolerated treatment well Patient left: in bed;with call bell/phone within reach;with nursing/sitter in room (sitting EoB, MD seen approaching in the hallway) Nurse Communication: Mobility status PT  Visit Diagnosis: Muscle weakness (generalized) (M62.81)     Time: 1050-1102 PT Time Calculation (min) (ACUTE ONLY): 12 min  Charges:  $Gait Training: 8-22 mins                     Sissy Goetzke B. Beverely Risen PT, DPT Acute Rehabilitation Services Pager [phone] Office [phone]    Elon Alas Fleet 05/04/2020, 12:41 PM

## 2020-05-04 NOTE — Discharge Summary (Addendum)
Physician Discharge Summary  Vincent Baxter ZOX:[id number] DOB: [birth year] DOA: 05/01/2020  PCP: Patient, No Pcp Per  Admit date: 05/01/2020 Discharge date: 05/04/2020  Admitted From: Home  Disposition:  Home   Recommendations for Outpatient Follow-up and new medication changes:  1. Follow up with Primary Care in 7 days.  2. Follow up with Vascular Surgery for incidental 3,8 cm infrarenal abdominal aortic aneurysm.  3. Added aspirin and statin therapy 4. Follow up with Cardiology as outpatient.  5. Added metoprolol for blood pressure and heart rate control.  6. Complete Augmentin 3 more days for pneumonia.   Home Health:  No  Equipment/Devices: no    Discharge Condition: stable  CODE STATUS: full  Diet recommendation: heart healthy   Brief/Interim Summary: [name] was hospitalized with the working diagnosis of acute on chronic diastolic heart failure exacerbation, in the setting of community-acquired pneumonia.  85 year old male with significant past medical history for hypertension, type 2 diabetes mellitus and kidney stones.  He reported right flank pain for 2 to 3 days, associated with dyspnea and bilateral extremity edema.  Positive subjective fevers at home.  On his initial physical examination blood pressure 152/103, heart rate 89, respiratory rate 18, oxygen saturation 94% with supplemental oxygen.  His lungs had no wheezing or rhonchi, heart S1-S2, present rhythm, soft abdomen soft, +1+ bilateral lower extremity edema. Sodium 143, potassium 4.3, chloride 111, bicarb 20, glucose 182, BUN 20, creatinine 1.42, troponin I 306, white count 15.0, hemoglobin 11.9, hematocrit 36.9, platelets 181.  D-dimer 2.0.  SARS COVID-19 is negative.  Urinalysis specific gravity 1.020, negative for infection.  Drug screen positive for tetrahydrocannabinol.  CT chest with bilateral groundglass opacities, bilateral upper lobe nodular densities, small bilateral pleural effusions and bibasilar  atelectasis.  Positive emphysema main aortic sclerosis.  3.8 cm partially thrombosed infrarenal arctic abdominal aneurysm.  No dissection.  Central pulmonary arteries were patent. EKG 100 bpm, normal axis, normal intervals, sinus rhythm, poor R wave progression, no significant ST segment or T wave changes.  Patient received intravenous furosemide, along with antibiotic therapy with good response.  1.  Acute on chronic diastolic heart failure exacerbation.  He was admitted to the telemetry ward, he received intravenous furosemide with good toleration.  He achieved a negative fluid balance with significant improvement of his symptoms. Further work-up with echocardiography showed a preserved systolic function, 50 to 55%, moderate left ventricular hypertrophy.  Patient will be discharged with as needed furosemide.  2.  Uncontrolled hypertension.  Patient received diuresis with good toleration, he was placed on metoprolol. His discharge blood pressure is 126/88.  Continue metoprolol at discharge.   3.  Community-acquired pneumonia (present on admission).  Received antibiotic therapy with ceftriaxone and azithromycin, will complete therapy with Augmentin for 3 more days.  4.  Type 2 diabetes mellitus, dyslipidemia.  Hemoglobin A1c 6.5.  Fasting glucose on the day of discharge is 90.  Continue lifestyle modifications.  Added atorvastatin.   5.  Acute kidney injury on CKD stage 3a, hypomagnesemia.  Patient tolerated well diuresis with furosemide, his discharge creatinine is 1.87, BUN 52, bicarb 26, sodium 138, potassium 4.2.  Close follow-up of kidney function as an outpatient.  6.  Episodic NSVT, PACs, PVCs.  Elevated troponin, likely demand ischemia.  Patient will be discharged on metoprolol, follow-up as an outpatient for possible outpatient stress test.  Added aspirin 81 mg daily.  7. COPD/tobacco abuse.  No signs of acute exacerbation.  Continue smoking cessation.   Discharge Diagnoses:  Principal Problem:   New onset of congestive heart failure (HCC) Active Problems:   DM2 (diabetes mellitus, type 2) (HCC)   Essential hypertension   AAA (abdominal aortic aneurysm) (HCC)   Elevated troponin   Right flank pain   Abnormal EKG   PAC (premature atrial contraction)   PVC (premature ventricular contraction)   Smoking    Discharge Instructions   Allergies as of 05/04/2020   No Known Allergies     Medication List    STOP taking these medications   cyclobenzaprine 5 MG tablet Commonly known as: FLEXERIL   pioglitazone 15 MG tablet Commonly known as: ACTOS   pioglitazone 30 MG tablet Commonly known as: ACTOS   tamsulosin 0.4 MG Caps capsule Commonly known as: Flomax   tiZANidine 4 MG tablet Commonly known as: Zanaflex   traMADol 50 MG tablet Commonly known as: ULTRAM     TAKE these medications   amoxicillin-clavulanate 500-125 MG tablet Commonly known as: Augmentin Take 1 tablet (500 mg total) by mouth 3 (three) times daily for 3 days.   aspirin 81 MG EC tablet Take 1 tablet (81 mg total) by mouth daily. Swallow whole. Start taking on: May 05, 2020   atorvastatin 20 MG tablet Commonly known as: LIPITOR Take 1 tablet (20 mg total) by mouth at bedtime.   furosemide 20 MG tablet Commonly known as: Lasix Take 1 tablet (20 mg total) by mouth daily as needed for up to 20 days for fluid or edema (Take as needed for lower extremity swelling or shortness of breath.).   metoprolol tartrate 25 MG tablet Commonly known as: LOPRESSOR Take 1 tablet (25 mg total) by mouth 2 (two) times daily.       Follow-up Information    Tessa Lerner, DO Follow up on 05/24/2020.   Specialties: Cardiology, Radiology, Vascular Surgery Why: 215pm  Contact information: [address] [place] [address] [phone]              No Known Allergies  Consultations:  Cardiology    Procedures/Studies: DG Ribs Unilateral W/Chest  Right  Result Date: 05/01/2020 CLINICAL DATA:  Rib pain and shortness of breath. EXAM: RIGHT RIBS AND CHEST - 3+ VIEW COMPARISON:  Chest x-ray on 11/14/2011 FINDINGS: Lungs demonstrate diffuse bilateral perihilar edema likely consistent with congestive heart failure. There are associated small bilateral pleural effusions. No airspace consolidation or pneumothorax. The heart size is within normal limits. Right ribs demonstrate no evidence of fracture or bony lesion. IMPRESSION: Findings likely representing congestive heart failure with small bilateral pleural effusions. Electronically Signed   By: Irish Lack M.D.   On: 05/01/2020 13:44   DG Lumbar Spine 2-3 Views  Result Date: 05/03/2020 CLINICAL DATA:  85 year old male with a history of pain in the lower back EXAM: LUMBAR SPINE - 2-3 VIEW COMPARISON:  07/18/2017 FINDINGS: Lumbar Spine: Lumbar vertebral elements maintain normal alignment without evidence of anterolisthesis, retrolisthesis, subluxation. No acute fracture line identified. Vertebral body heights maintained. Early endplate changes without significant disc space narrowing. Facet hypertrophy developing at L4-L5 and L5-S1. Calcification within the right abdomen over the right renal silhouette, compatible with right-sided nephrolithiasis IMPRESSION: Negative for acute fracture or malalignment of the lumbar spine. Unchanged appearance of right-sided renal calculus. Electronically Signed   By: Gilmer Mor D.O.   On: 05/03/2020 09:57   ECHOCARDIOGRAM COMPLETE  Result Date: 05/02/2020    ECHOCARDIOGRAM REPORT   Patient Name:   Hilliard TONYA CARLILE Date of Exam: 05/02/2020 Medical  Rec #:  [id number]      Height:       65.0 in Accession #:    [id number]     Weight:       125.0 lb Date of Birth:  May 05, 1935     BSA:          1.620 m Patient Age:    84 years       BP:           129/81 mmHg Patient Gender: M              HR:           91 bpm. Exam Location:  Inpatient Procedure: 2D Echo Indications:     Dyspnea 786.09 / R06.00  History:        Patient has no prior history of Echocardiogram examinations.                 Risk Factors:Diabetes, Hypertension and Dyslipidemia. AAA.                 Pneumonia.  Sonographer:    Leta Junglingiffany Cooper RDCS Referring Phys: [id number] SUNIT TOLIA IMPRESSIONS  1. Left ventricular ejection fraction, by estimation, is 50 to 55%. The left ventricle has low normal function. The left ventricle has no regional wall motion abnormalities. There is moderate left ventricular hypertrophy. Left ventricular diastolic parameters are consistent with Grade II diastolic dysfunction (pseudonormalization).  2. Right ventricular systolic function is normal. The right ventricular size is normal.  3. The mitral valve is normal in structure. Mild mitral valve regurgitation. No evidence of mitral stenosis.  4. The aortic valve is calcified. Aortic valve regurgitation is not visualized. Mild to moderate aortic valve sclerosis/calcification is present, without any evidence of aortic stenosis.  5. The inferior vena cava is normal in size with greater than 50% respiratory variability, suggesting right atrial pressure of 3 mmHg. FINDINGS  Left Ventricle: Left ventricular ejection fraction, by estimation, is 50 to 55%. The left ventricle has low normal function. The left ventricle has no regional wall motion abnormalities. The left ventricular internal cavity size was normal in size. There is moderate left ventricular hypertrophy. Left ventricular diastolic parameters are consistent with Grade II diastolic dysfunction (pseudonormalization). Right Ventricle: The right ventricular size is normal. No increase in right ventricular wall thickness. Right ventricular systolic function is normal. Left Atrium: Left atrial size was normal in size. Right Atrium: Right atrial size was normal in size. Pericardium: There is no evidence of pericardial effusion. Mitral Valve: The mitral valve is normal in structure. Mild mitral  valve regurgitation. No evidence of mitral valve stenosis. Tricuspid Valve: The tricuspid valve is normal in structure. Tricuspid valve regurgitation is not demonstrated. No evidence of tricuspid stenosis. Aortic Valve: The aortic valve is calcified. Aortic valve regurgitation is not visualized. Mild to moderate aortic valve sclerosis/calcification is present, without any evidence of aortic stenosis. Pulmonic Valve: The pulmonic valve was normal in structure. Pulmonic valve regurgitation is not visualized. No evidence of pulmonic stenosis. Aorta: The aortic root is normal in size and structure. Venous: The inferior vena cava is normal in size with greater than 50% respiratory variability, suggesting right atrial pressure of 3 mmHg. IAS/Shunts: No atrial level shunt detected by color flow Doppler.  LEFT VENTRICLE PLAX 2D LVIDd:         4.00 cm      Diastology LVIDs:         3.40 cm      LV  e' medial:    5.66 cm/s LV PW:         1.10 cm      LV E/e' medial:  15.2 LV IVS:        1.60 cm      LV e' lateral:   5.66 cm/s LVOT diam:     2.00 cm      LV E/e' lateral: 15.2 LV SV:         28 LV SV Index:   17 LVOT Area:     3.14 cm  LV Volumes (MOD) LV vol d, MOD A2C: 101.0 ml LV vol d, MOD A4C: 113.0 ml LV vol s, MOD A2C: 55.1 ml LV vol s, MOD A4C: 59.3 ml LV SV MOD A2C:     45.9 ml LV SV MOD A4C:     113.0 ml LV SV MOD BP:      50.1 ml RIGHT VENTRICLE RV S prime:     10.60 cm/s TAPSE (M-mode): 1.7 cm LEFT ATRIUM             Index       RIGHT ATRIUM           Index LA diam:        2.30 cm 1.42 cm/m  RA Area:     16.00 cm LA Vol (A2C):   36.8 ml 22.72 ml/m RA Volume:   42.40 ml  26.17 ml/m LA Vol (A4C):   43.7 ml 26.98 ml/m LA Biplane Vol: 41.4 ml 25.56 ml/m  AORTIC VALVE LVOT Vmax:   56.80 cm/s LVOT Vmean:  38.000 cm/s LVOT VTI:    0.088 m  AORTA Ao Root diam: 3.50 cm Ao Asc diam:  3.20 cm MITRAL VALVE MV Area (PHT): 4.71 cm    SHUNTS MV Decel Time: 161 msec    Systemic VTI:  0.09 m MV E velocity: 85.90 cm/s   Systemic Diam: 2.00 cm MV A velocity: 53.10 cm/s MV E/A ratio:  1.62 Donato Schultz MD Electronically signed by Donato Schultz MD Signature Date/Time: 05/02/2020/4:08:46 PM    Final    CT Angio Chest/Abd/Pel for Dissection W and/or Wo Contrast  Result Date: 05/01/2020 CLINICAL DATA:  85 year old male with chest and back pain. Concern for aortic dissection. EXAM: CT ANGIOGRAPHY CHEST, ABDOMEN AND PELVIS TECHNIQUE: Non-contrast CT of the chest was initially obtained. Multidetector CT imaging through the chest, abdomen and pelvis was performed using the standard protocol during bolus administration of intravenous contrast. Multiplanar reconstructed images and MIPs were obtained and reviewed to evaluate the vascular anatomy. CONTRAST:  11mL OMNIPAQUE IOHEXOL 350 MG/ML SOLN COMPARISON:  CT of the abdomen pelvis dated 11/11/2012. FINDINGS: Limited study due to paucity of intra-abdominal and mesenteric fat and cachexia as well as secondary to streak artifacts caused by overlying support wires. CTA CHEST FINDINGS Cardiovascular: There is no cardiomegaly or pericardial effusion. There is mild atherosclerotic calcification of the thoracic aorta. No aneurysmal dilatation or dissection. The origins of the great vessels of the aortic arch appear patent as visualized. The central pulmonary arteries are patent. Mediastinum/Nodes: No hilar or mediastinal adenopathy. The esophagus is grossly unremarkable. No mediastinal fluid collection Lungs/Pleura: Small bilateral pleural effusions with partial compressive atelectasis of the lower lobes versus pneumonia. Clinical correlation is recommended. There is background of emphysema. Bilateral upper lobe nodular densities most consistent with multifocal pneumonia. Clinical correlation and follow-up to resolution recommended. There is no pneumothorax. The central airways are patent. Musculoskeletal: Degenerative changes of the spine. No acute osseous  pathology. There is loss of subcutaneous  fat and cachexia. Review of the MIP images confirms the above findings. CTA ABDOMEN AND PELVIS FINDINGS VASCULAR Aorta: There is moderate atherosclerotic calcification of the abdominal aorta. There is a 3.8 cm partially thrombosed infrarenal abdominal aortic aneurysm. Evaluation of the aneurysm is limited as precontrast images are not provided through the abdomen. There is however no contrast blush within the thrombosed portion of the aneurysm lumen. The no periaortic inflammation or fluid collection. No extraluminal contrast. No dissection. Celiac: Patent without evidence of aneurysm, dissection, vasculitis or significant stenosis. SMA: Patent without evidence of aneurysm, dissection, vasculitis or significant stenosis. Renals: The right renal artery is small and appears narrow, likely related to chronic scarring. There is diminished flow in the right renal artery. The left renal artery is patent. IMA: Patent without evidence of aneurysm, dissection, vasculitis or significant stenosis. Inflow: Mild atherosclerotic calcification. No aneurysmal dilatation or dissection. Veins: No obvious venous abnormality within the limitations of this arterial phase study. Review of the MIP images confirms the above findings. NON-VASCULAR6 No intra-abdominal free air or free fluid. Hepatobiliary: The liver and gallbladder are grossly unremarkable. Pancreas: Unremarkable. No pancreatic ductal dilatation or surrounding inflammatory changes. Spleen: Normal in size without focal abnormality. Adrenals/Urinary Tract: The adrenal glands unremarkable. Moderate right renal parenchyma atrophy. Nonobstructing right renal inferior pole calculus measures 10 mm. No hydronephrosis. The left kidney is unremarkable. The urinary bladder is grossly unremarkable Stomach/Bowel: There is no bowel obstruction or active inflammation. The appendix is poorly visualized. Lymphatic: No adenopathy. Reproductive: The prostate is grossly unremarkable. Other:  There is loss of subcutaneous fat and cachexia. Musculoskeletal: Osteopenia with degenerative changes of the spine. No acute osseous pathology. Review of the MIP images confirms the above findings. IMPRESSION: 1. No aortic dissection. 2. A 3.8 cm partially thrombosed infrarenal abdominal aortic aneurysm. No periaortic inflammation or fluid collection. Recommend follow-up ultrasound every 2 years. This recommendation follows ACR consensus guidelines: White Paper of the ACR Incidental Findings Committee II on Vascular Findings. J Am Coll Radiol 2013; 10:789-794. 3. Bilateral upper lobe nodular densities consistent with pneumonia. Clinical correlation and follow-up to resolution recommended. 4. Small bilateral pleural effusions with partial compressive atelectasis of the lower lobes versus pneumonia. Clinical correlation and follow-up to resolution recommended. 5. Aortic Atherosclerosis (ICD10-I70.0) and Emphysema (ICD10-J43.9). Electronically Signed   By: Elgie Collard M.D.   On: 05/01/2020 22:04       Subjective: Patient is feeling well, no nausea or vomiting, dyspnea has improved and lower extremity edema has resolved.   Discharge Exam: Vitals:   05/04/20 0725 05/04/20 0815  BP: 102/73   Pulse:  64  Resp: 17   Temp: (!) 97.3 F (36.3 C)   SpO2:     Vitals:   05/04/20 0300 05/04/20 0621 05/04/20 0725 05/04/20 0815  BP:  126/88 102/73   Pulse:  67  64  Resp: Temp:  97.6 F (36.4 C) (!) 97.3 F (36.3 C)   TempSrc:  Oral Oral   SpO2:  95%    Weight:      Height:        General: Not in pain or dyspnea.  Neurology: Awake and alert, non focal  E ENT: no pallor, no icterus, oral mucosa moist Cardiovascular: No JVD. S1-S2 present, rhythmic, no gallops, rubs, or murmurs. No lower extremity edema. Pulmonary: positive breath sounds bilaterally with no wheezing, rhonchi or rales. Gastrointestinal. Abdomen soft and non tender Skin. No rashes Musculoskeletal:  no joint  deformities   The results of significant diagnostics from this hospitalization (including imaging, microbiology, ancillary and laboratory) are listed below for reference.     Microbiology: Recent Results (from the past 240 hour(s))  Blood culture (routine x 2)     Status: None (Preliminary result)   Collection Time: 05/01/20  9:05 PM   Specimen: BLOOD  Result Value Ref Range Status   Specimen Description BLOOD RIGHT ANTECUBITAL  Final   Special Requests   Final    BOTTLES DRAWN AEROBIC AND ANAEROBIC Blood Culture adequate volume   Culture   Final    NO GROWTH 2 DAYS Performed at [facility], [address], [place], [place] [address]    Report Status PENDING  Incomplete  Blood culture (routine x 2)     Status: None (Preliminary result)   Collection Time: 05/01/20  9:57 PM   Specimen: BLOOD  Result Value Ref Range Status   Specimen Description BLOOD LEFT ANTECUBITAL  Final   Special Requests   Final    BOTTLES DRAWN AEROBIC AND ANAEROBIC Blood Culture results may not be optimal due to an inadequate volume of blood received in culture bottles   Culture   Final    NO GROWTH 2 DAYS Performed at [facility], [address] [place], [place] [address]    Report Status PENDING  Incomplete  Respiratory Panel by RT PCR (Flu A&B, Covid) - Nasopharyngeal Swab     Status: None   Collection Time: 05/01/20  9:59 PM   Specimen: Nasopharyngeal Swab  Result Value Ref Range Status   SARS Coronavirus 2 by RT PCR NEGATIVE NEGATIVE Final    Comment: (NOTE) SARS-CoV-2 target nucleic acids are NOT DETECTED.  The SARS-CoV-2 RNA is generally detectable in upper respiratoy specimens during the acute phase of infection. The lowest concentration of SARS-CoV-2 viral copies this assay can detect is 131 copies/mL. A negative result does not preclude SARS-Cov-2 infection and should not be used as the sole basis for treatment or other patient management decisions. A negative result may  occur with  improper specimen collection/handling, submission of specimen other than nasopharyngeal swab, presence of viral mutation(s) within the areas targeted by this assay, and inadequate number of viral copies (<131 copies/mL). A negative result must be combined with clinical observations, patient history, and epidemiological information. The expected result is Negative.  Fact Sheet for Patients:  https://www.moore.com/  Fact Sheet for Healthcare Providers:  https://www.young.biz/  This test is no t yet approved or cleared by the [place] FDA and  has been authorized for detection and/or diagnosis of SARS-CoV-2 by FDA under an Emergency Use Authorization (EUA). This EUA will remain  in effect (meaning this test can be used) for the duration of the COVID-19 declaration under Section 564(b)(1) of the Act, 21 U.S.C. section 360bbb-3(b)(1), unless the authorization is terminated or revoked sooner.     Influenza A by PCR NEGATIVE NEGATIVE Final   Influenza B by PCR NEGATIVE NEGATIVE Final    Comment: (NOTE) The Xpert Xpress SARS-CoV-2/FLU/RSV assay is intended as an aid in  the diagnosis of influenza from Nasopharyngeal swab specimens and  should not be used as a sole basis for treatment. Nasal washings and  aspirates are unacceptable for Xpert Xpress SARS-CoV-2/FLU/RSV  testing.  Fact Sheet for Patients: https://www.moore.com/  Fact Sheet for Healthcare Providers: https://www.young.biz/  This test is not yet approved or cleared by the [place] and  has been authorized for  detection and/or diagnosis of SARS-CoV-2 by  FDA under an Emergency Use Authorization (EUA). This EUA will remain  in effect (meaning this test can be used) for the duration of the  Covid-19 declaration under Section 564(b)(1) of the Act, 21  U.S.C. section 360bbb-3(b)(1), unless the authorization is  terminated or  revoked. Performed at [facility], [address], [place], [place] [address]   MRSA PCR Screening     Status: None   Collection Time: 05/02/20  6:31 PM   Specimen: Nasal Mucosa; Nasopharyngeal  Result Value Ref Range Status   MRSA by PCR NEGATIVE NEGATIVE Final    Comment:        The GeneXpert MRSA Assay (FDA approved for NASAL specimens only), is one component of a comprehensive MRSA colonization surveillance program. It is not intended to diagnose MRSA infection nor to guide or monitor treatment for MRSA infections. Performed at [facility], [address], [place], [place] [address]      Labs: BNP (last 3 results) Recent Labs    05/01/20 1836  BNP 916.5*   Basic Metabolic Panel: Recent Labs  Lab 05/01/20 1455 05/02/20 0307 05/02/20 2253 05/03/20 0553 05/03/20 1045 05/04/20 0113  NA 143 141  --  140  --  138  K 4.3 3.8 4.2 4.4  --  4.2  CL 111 109  --  104  --  105  CO2 20* 23  --  24  --  26  GLUCOSE 182* 150*  --  161*  --  90  BUN 20 26*  --  45*  --  52*  CREATININE 1.47* 1.61*  --  1.90*  --  1.87*  CALCIUM 9.1 9.1  --  8.9  --  8.8*  MG  --   --  1.5*  --  2.1  --    Liver Function Tests: Recent Labs  Lab 05/01/20 1949  AST 27  ALT 92*  ALKPHOS 104  BILITOT 1.2  PROT 6.3*  ALBUMIN 3.4*   No results for input(s): LIPASE, AMYLASE in the last 168 hours. No results for input(s): AMMONIA in the last 168 hours. CBC: Recent Labs  Lab 05/01/20 1455 05/02/20 0307 05/04/20 0113  WBC 15.0* 13.7* 9.0  HGB 11.9* 11.3* 11.4*  HCT 36.9* 32.3* 32.2*  MCV 100.8* 94.2 94.4  PLT 181 175 193   Cardiac Enzymes: No results for input(s): CKTOTAL, CKMB, CKMBINDEX, TROPONINI in the last 168 hours. BNP: Invalid input(s): POCBNP CBG: Recent Labs  Lab 05/03/20 0611 05/03/20 1130 05/03/20 1555 05/03/20 2125 05/04/20 0617  GLUCAP 166* 189* 236* 203* 209*   D-Dimer Recent Labs    05/01/20 1949  DDIMER 2.03*   Hgb A1c Recent Labs     05/02/20 0307  HGBA1C 6.5*   Lipid Profile No results for input(s): CHOL, HDL, LDLCALC, TRIG, CHOLHDL, LDLDIRECT in the last 72 hours. Thyroid function studies Recent Labs    05/01/20 2206  TSH 0.845   Anemia work up Recent Labs    05/03/20 1045  VITAMINB12 320  FOLATE 6.8   Urinalysis    Component Value Date/Time   COLORURINE YELLOW 11/11/2012 1001   APPEARANCEUR CLEAR 11/11/2012 1001   LABSPEC 1.020 05/01/2020 1238   PHURINE 5.5 05/01/2020 1238   GLUCOSEU NEGATIVE 05/01/2020 1238   GLUCOSEU NEGATIVE 02/11/2012 0931   HGBUR TRACE (A) 05/01/2020 1238   BILIRUBINUR NEGATIVE 05/01/2020 1238   KETONESUR NEGATIVE 05/01/2020 1238   PROTEINUR 100 (A) 05/01/2020 1238  UROBILINOGEN 1.0 05/01/2020 1238   NITRITE NEGATIVE 05/01/2020 1238   LEUKOCYTESUR NEGATIVE 05/01/2020 1238   Sepsis Labs Invalid input(s): PROCALCITONIN,  WBC,  LACTICIDVEN Microbiology Recent Results (from the past 240 hour(s))  Blood culture (routine x 2)     Status: None (Preliminary result)   Collection Time: 05/01/20  9:05 PM   Specimen: BLOOD  Result Value Ref Range Status   Specimen Description BLOOD RIGHT ANTECUBITAL  Final   Special Requests   Final    BOTTLES DRAWN AEROBIC AND ANAEROBIC Blood Culture adequate volume   Culture   Final    NO GROWTH 2 DAYS Performed at [facility], [address] [place], [place] [address]    Report Status PENDING  Incomplete  Blood culture (routine x 2)     Status: None (Preliminary result)   Collection Time: 05/01/20  9:57 PM   Specimen: BLOOD  Result Value Ref Range Status   Specimen Description BLOOD LEFT ANTECUBITAL  Final   Special Requests   Final    BOTTLES DRAWN AEROBIC AND ANAEROBIC Blood Culture results may not be optimal due to an inadequate volume of blood received in culture bottles   Culture   Final    NO GROWTH 2 DAYS Performed at [facility], [address] [place], [place] [address]    Report Status PENDING   Incomplete  Respiratory Panel by RT PCR (Flu A&B, Covid) - Nasopharyngeal Swab     Status: None   Collection Time: 05/01/20  9:59 PM   Specimen: Nasopharyngeal Swab  Result Value Ref Range Status   SARS Coronavirus 2 by RT PCR NEGATIVE NEGATIVE Final    Comment: (NOTE) SARS-CoV-2 target nucleic acids are NOT DETECTED.  The SARS-CoV-2 RNA is generally detectable in upper respiratoy specimens during the acute phase of infection. The lowest concentration of SARS-CoV-2 viral copies this assay can detect is 131 copies/mL. A negative result does not preclude SARS-Cov-2 infection and should not be used as the sole basis for treatment or other patient management decisions. A negative result may occur with  improper specimen collection/handling, submission of specimen other than nasopharyngeal swab, presence of viral mutation(s) within the areas targeted by this assay, and inadequate number of viral copies (<131 copies/mL). A negative result must be combined with clinical observations, patient history, and epidemiological information. The expected result is Negative.  Fact Sheet for Patients:  https://www.moore.com/  Fact Sheet for Healthcare Providers:  https://www.young.biz/  This test is no t yet approved or cleared by the [place] FDA and  has been authorized for detection and/or diagnosis of SARS-CoV-2 by FDA under an Emergency Use Authorization (EUA). This EUA will remain  in effect (meaning this test can be used) for the duration of the COVID-19 declaration under Section 564(b)(1) of the Act, 21 U.S.C. section 360bbb-3(b)(1), unless the authorization is terminated or revoked sooner.     Influenza A by PCR NEGATIVE NEGATIVE Final   Influenza B by PCR NEGATIVE NEGATIVE Final    Comment: (NOTE) The Xpert Xpress SARS-CoV-2/FLU/RSV assay is intended as an aid in  the diagnosis of influenza from Nasopharyngeal swab specimens and  should not  be used as a sole basis for treatment. Nasal washings and  aspirates are unacceptable for Xpert Xpress SARS-CoV-2/FLU/RSV  testing.  Fact Sheet for Patients: https://www.moore.com/  Fact Sheet for Healthcare Providers: https://www.young.biz/  This test is not yet approved or cleared by the [place] FDA and  has been authorized for detection  and/or diagnosis of SARS-CoV-2 by  FDA under an Emergency Use Authorization (EUA). This EUA will remain  in effect (meaning this test can be used) for the duration of the  Covid-19 declaration under Section 564(b)(1) of the Act, 21  U.S.C. section 360bbb-3(b)(1), unless the authorization is  terminated or revoked. Performed at [facility], [address], [place], [place] [address]   MRSA PCR Screening     Status: None   Collection Time: 05/02/20  6:31 PM   Specimen: Nasal Mucosa; Nasopharyngeal  Result Value Ref Range Status   MRSA by PCR NEGATIVE NEGATIVE Final    Comment:        The GeneXpert MRSA Assay (FDA approved for NASAL specimens only), is one component of a comprehensive MRSA colonization surveillance program. It is not intended to diagnose MRSA infection nor to guide or monitor treatment for MRSA infections. Performed at [facility], [address] [place], [place] [address]      Time coordinating discharge: 45 minutes  SIGNED:   Coralie Keens, MD  [facility] 05/04/2020, 11:19 AM

## 2020-05-04 NOTE — Care Management Important Message (Signed)
  Important Message  Patient Details  Name: Vincent Baxter MRN: [id number] Date of Birth: [birth year]   Medicare Important Message Given:  Yes  IM mailed to the patient address.     Mubashir Mallek 05/04/2020, 3:30 PM

## 2020-05-04 NOTE — Telephone Encounter (Signed)
Location of hospitalization: [facility] Reason for hospitalization: Patient was SOB Date of discharge: 05/04/2020 Date of first communication with patient: today Person contacting patient: Erby Pian Current symptoms: Patient stated that he feels better Do you understand why you were in the Hospital: Yes Questions regarding discharge instructions: None Where were you discharged to: Home Medications reviewed: Yes Allergies reviewed: Yes Dietary changes reviewed: Yes. No dietary restrictions  Referals reviewed: NA Activities of Daily Living: Able to with mild limitations Any transportation issues/concerns: None Any patient concerns: None Confirmed importance & date/time of Follow up appt: Yes. He wrote down date/time on his calendar. Confirmed with patient if condition begins to worsen call. Pt was given the office number and encouraged to call back with questions or concerns: Yes

## 2020-05-04 NOTE — Telephone Encounter (Signed)
Patient needs a TOC scheduled for hospital NP appointment on 05/24/2020 per Dr. Odis Hollingshead . Thank you

## 2020-05-04 NOTE — Plan of Care (Signed)

## 2020-05-04 NOTE — Telephone Encounter (Signed)
Attempted to reach patient to complete TOC. Unable to reach patient. Patient does not have a vm that has been set up. Will attempt to contact patient at a later time.

## 2020-05-04 NOTE — Progress Notes (Signed)
Pt discharged to home, taken to employee entrance, per the Pt's request.  Pt ambulatory, refused w/c

## 2020-05-04 NOTE — Telephone Encounter (Signed)
Attempt #2 Attempted to reach patient. No answer, unable to leave vm.

## 2020-05-04 NOTE — Telephone Encounter (Signed)
error 

## 2020-05-05 NOTE — Telephone Encounter (Signed)
Please take care of this.  

## 2020-05-05 NOTE — Telephone Encounter (Signed)
NP hospital FU

## 2020-05-05 NOTE — Telephone Encounter (Signed)
This is regular visit not a TOC.

## 2020-05-06 LAB — CULTURE, BLOOD (ROUTINE X 2)
Culture: NO GROWTH
Culture: NO GROWTH
Special Requests: ADEQUATE

## 2020-05-24 ENCOUNTER — Ambulatory Visit: Payer: Medicare Other | Admitting: Cardiology

## 2021-10-30 ENCOUNTER — Encounter (HOSPITAL_COMMUNITY): Payer: Self-pay | Admitting: Emergency Medicine

## 2021-10-30 ENCOUNTER — Emergency Department (HOSPITAL_COMMUNITY): Payer: Medicare Other

## 2021-10-30 ENCOUNTER — Emergency Department (HOSPITAL_COMMUNITY)
Admission: EM | Admit: 2021-10-30 | Discharge: 2021-10-31 | Disposition: A | Discharge status: Home / Self Care | Payer: Medicare Other | Attending: Emergency Medicine | Admitting: Emergency Medicine

## 2021-10-30 ENCOUNTER — Other Ambulatory Visit: Payer: Self-pay

## 2021-10-30 DIAGNOSIS — R918 Other nonspecific abnormal finding of lung field: Secondary | ICD-10-CM

## 2021-10-30 DIAGNOSIS — I10 Essential (primary) hypertension: Secondary | ICD-10-CM | POA: Diagnosis not present

## 2021-10-30 DIAGNOSIS — R0602 Shortness of breath: Secondary | ICD-10-CM | POA: Insufficient documentation

## 2021-10-30 DIAGNOSIS — J811 Chronic pulmonary edema: Secondary | ICD-10-CM | POA: Diagnosis not present

## 2021-10-30 DIAGNOSIS — J810 Acute pulmonary edema: Secondary | ICD-10-CM | POA: Insufficient documentation

## 2021-10-30 DIAGNOSIS — Z87442 Personal history of urinary calculi: Secondary | ICD-10-CM | POA: Insufficient documentation

## 2021-10-30 DIAGNOSIS — R0789 Other chest pain: Secondary | ICD-10-CM | POA: Insufficient documentation

## 2021-10-30 DIAGNOSIS — R748 Abnormal levels of other serum enzymes: Secondary | ICD-10-CM | POA: Diagnosis not present

## 2021-10-30 DIAGNOSIS — E1165 Type 2 diabetes mellitus with hyperglycemia: Secondary | ICD-10-CM | POA: Diagnosis not present

## 2021-10-30 DIAGNOSIS — R911 Solitary pulmonary nodule: Secondary | ICD-10-CM | POA: Diagnosis not present

## 2021-10-30 DIAGNOSIS — J449 Chronic obstructive pulmonary disease, unspecified: Secondary | ICD-10-CM | POA: Diagnosis not present

## 2021-10-30 DIAGNOSIS — J90 Pleural effusion, not elsewhere classified: Secondary | ICD-10-CM | POA: Diagnosis not present

## 2021-10-30 LAB — CBC WITH DIFFERENTIAL/PLATELET
Abs Immature Granulocytes: 0.03 10*3/uL (ref 0.00–0.07)
Basophils Absolute: 0.1 10*3/uL (ref 0.0–0.1)
Basophils Relative: 1 %
Eosinophils Absolute: 0.1 10*3/uL (ref 0.0–0.5)
Eosinophils Relative: 1 %
HCT: 35.4 % — ABNORMAL LOW (ref 39.0–52.0)
Hemoglobin: 12.3 g/dL — ABNORMAL LOW (ref 13.0–17.0)
Immature Granulocytes: 0 %
Lymphocytes Relative: 24 %
Lymphs Abs: 1.7 10*3/uL (ref 0.7–4.0)
MCH: 33.4 pg (ref 26.0–34.0)
MCHC: 34.7 g/dL (ref 30.0–36.0)
MCV: 96.2 fL (ref 80.0–100.0)
Monocytes Absolute: 0.5 10*3/uL (ref 0.1–1.0)
Monocytes Relative: 8 %
Neutro Abs: 4.7 10*3/uL (ref 1.7–7.7)
Neutrophils Relative %: 66 %
Platelets: 216 10*3/uL (ref 150–400)
RBC: 3.68 MIL/uL — ABNORMAL LOW (ref 4.22–5.81)
RDW: 13.6 % (ref 11.5–15.5)
WBC: 7.1 10*3/uL (ref 4.0–10.5)
nRBC: 0 % (ref 0.0–0.2)

## 2021-10-30 LAB — BASIC METABOLIC PANEL
Anion gap: 9 (ref 5–15)
BUN: 25 mg/dL — ABNORMAL HIGH (ref 8–23)
CO2: 23 mmol/L (ref 22–32)
Calcium: 9 mg/dL (ref 8.9–10.3)
Chloride: 109 mmol/L (ref 98–111)
Creatinine, Ser: 1.47 mg/dL — ABNORMAL HIGH (ref 0.61–1.24)
GFR, Estimated: 47 mL/min — ABNORMAL LOW (ref >60)
Glucose, Bld: 174 mg/dL — ABNORMAL HIGH (ref 70–99)
Potassium: 3.9 mmol/L (ref 3.5–5.1)
Sodium: 141 mmol/L (ref 135–145)

## 2021-10-30 LAB — TROPONIN I (HIGH SENSITIVITY): Troponin I (High Sensitivity): 125 ng/L (ref <18)

## 2021-10-30 NOTE — ED Provider Triage Note (Addendum)
Emergency Medicine Provider Triage Evaluation Note ? ?Vincent Baxter , a 87 y.o. male  was evaluated in triage.  Pt with multiple complaints.  Reports "knot" on back that has been there for quite some time but seems to be bothering him more lately.  Also reports some shortness of breath and swelling at his ankles.  He denies any cough or fever. ? ?Review of Systems  ?Positive: SOB, ankle swelling, back pain ?Negative: fever ? ?Physical Exam  ?BP (!) 164/111 (BP Location: Right Arm)   Pulse 87   Temp 97.6 ?F (36.4 ?C) (Oral)   Resp 14   SpO2 99%  ?Gen:   Awake, no distress   ?Resp:  Normal effort  ?MSK:   Moves extremities without difficulty  ?Other:  Lungs are clear, speaking in paragraphs without any acute distress noted, no pitting edema noted to the legs ? ?Medical Decision Making  ?Medically screening exam initiated at 10:25 PM.  Appropriate orders placed.  KAINAN LANGFITT was informed that the remainder of the evaluation will be completed by another provider, this initial triage assessment does not replace that evaluation, and the importance of remaining in the ED until their evaluation is complete. ? ?Multiple complaints--back pain, shortness of breath, ankle swelling.  EKG, labs, chest x-ray. ? ?11:54 PM ?Notified of critical trop of 125.  Appears to have pulmonary edema on CXR.  Charge RN notified, will expedite room assignment. ?  ?Larene Pickett, PA-C ?10/30/21 2227 ? ?  ?Larene Pickett, PA-C ?10/30/21 2355 ? ?

## 2021-10-30 NOTE — ED Triage Notes (Signed)
Pt reported to ED with c/o pain to right lateral back. Pt states he has a hx of a "knot on back" in the area. Also states he has been having some shortness of breath and swelling to legs.  ?

## 2021-10-31 ENCOUNTER — Emergency Department (HOSPITAL_COMMUNITY): Payer: Medicare Other

## 2021-10-31 DIAGNOSIS — J449 Chronic obstructive pulmonary disease, unspecified: Secondary | ICD-10-CM | POA: Diagnosis not present

## 2021-10-31 LAB — BRAIN NATRIURETIC PEPTIDE: B Natriuretic Peptide: 651.1 pg/mL — ABNORMAL HIGH (ref 0.0–100.0)

## 2021-10-31 LAB — TROPONIN I (HIGH SENSITIVITY): Troponin I (High Sensitivity): 127 ng/L (ref <18)

## 2021-10-31 LAB — D-DIMER, QUANTITATIVE: D-Dimer, Quant: 1.86 ug/mL-FEU — ABNORMAL HIGH (ref 0.00–0.50)

## 2021-10-31 MED ORDER — IOHEXOL 350 MG/ML SOLN
68.0000 mL | Freq: Once | INTRAVENOUS | Status: AC | PRN
  Administered 2021-10-31: 68 mL via INTRAVENOUS

## 2021-10-31 MED ORDER — ALBUTEROL SULFATE HFA 108 (90 BASE) MCG/ACT IN AERS
2.0000 | INHALATION_SPRAY | Freq: Once | RESPIRATORY_TRACT | Status: AC
  Administered 2021-10-31: 2 via RESPIRATORY_TRACT
  Filled 2021-10-31: qty 6.7

## 2021-10-31 MED ORDER — DOXYCYCLINE HYCLATE 100 MG PO CAPS: 100.0000 mg | ORAL_CAPSULE | Freq: Two times a day (BID) | ORAL | 0 refills | Status: DC

## 2021-10-31 MED ORDER — FUROSEMIDE 10 MG/ML IJ SOLN
40.0000 mg | Freq: Once | INTRAMUSCULAR | Status: AC
  Administered 2021-10-31: 40 mg via INTRAVENOUS
  Filled 2021-10-31: qty 4

## 2021-10-31 NOTE — ED Notes (Signed)
Patient verbalizes understanding of d/c instructions. Opportunities for questions and answers were provided. Pt d/c from ED and wheeled to lobby.  

## 2021-10-31 NOTE — ED Provider Notes (Signed)
?[facility] ?Provider Note ? ? ?CSN: [id number] ?Arrival date & time: 10/30/21  2122 ? ?  ? ?History ? ?Chief Complaint  ?Patient presents with  ? Back Pain  ? ? ?Vincent Baxter is a 87 y.o. male. ? ?HPI ? ?  ? ?This is an 87 year old male who presents with right lower chest wall pain.  Patient states he has had pain and a spot on his back for "some time" but that it is bothering him more lately.  He describes having this pain on and off for years.  He states when the pain comes on it does make him short of breath.  He has not had any recent fevers but does report an occasional dry cough.  Describes the pain as sharp and nonradiating.  Sometimes it is better when he lays a certain way.  Currently he is without pain.  He has not noted any rash. ? ?Home Medications ?Prior to Admission medications   ?Medication Sig Start Date End Date Taking? Authorizing Provider  ?doxycycline (VIBRAMYCIN) 100 MG capsule Take 1 capsule (100 mg total) by mouth 2 (two) times daily. 10/31/21  Yes Deaisa Merida, Barbette Hair, MD  ?aspirin EC 81 MG EC tablet Take 1 tablet (81 mg total) by mouth daily. Swallow whole. ?Patient not taking: Reported on 10/31/2021 05/05/20   Arrien, Jimmy Picket, MD  ?atorvastatin (LIPITOR) 20 MG tablet Take 1 tablet (20 mg total) by mouth at bedtime. ?Patient not taking: Reported on 10/31/2021 05/04/20 06/03/20  Arrien, Jimmy Picket, MD  ?furosemide (LASIX) 20 MG tablet Take 1 tablet (20 mg total) by mouth daily as needed for up to 20 days for fluid or edema (Take as needed for lower extremity swelling or shortness of breath.). ?Patient not taking: Reported on 10/31/2021 05/04/20 05/24/20  Arrien, Jimmy Picket, MD  ?metoprolol tartrate (LOPRESSOR) 25 MG tablet Take 1 tablet (25 mg total) by mouth 2 (two) times daily. ?Patient not taking: Reported on 10/31/2021 05/04/20 06/03/20  Arrien, Jimmy Picket, MD  ?   ? ?Allergies    ?Patient has no known allergies.   ? ?Review of  Systems   ?Review of Systems  ?Constitutional:  Negative for fever.  ?Respiratory:  Positive for shortness of breath.   ?Cardiovascular:  Positive for chest pain.  ?All other systems reviewed and are negative. ? ?Physical Exam ?Updated Vital Signs ?BP (!) 145/97   Pulse 78   Temp 97.6 ?F (36.4 ?C) (Oral)   Resp 16   SpO2 97%  ?Physical Exam ?Vitals and nursing note reviewed.  ?Constitutional:   ?   Appearance: He is well-developed. He is not ill-appearing.  ?HENT:  ?   Head: Normocephalic and atraumatic.  ?Eyes:  ?   Pupils: Pupils are equal, round, and reactive to light.  ?Cardiovascular:  ?   Rate and Rhythm: Normal rate and regular rhythm.  ?   Heart sounds: Normal heart sounds. No murmur heard. ?Pulmonary:  ?   Effort: Pulmonary effort is normal. No respiratory distress.  ?   Breath sounds: Normal breath sounds. No wheezing.  ?   Comments: Posterior chest wall point tenderness to palpation ?Chest:  ?   Chest wall: Tenderness present.  ?Abdominal:  ?   General: Bowel sounds are normal.  ?   Palpations: Abdomen is soft.  ?   Tenderness: There is no abdominal tenderness. There is no rebound.  ?Musculoskeletal:  ?   Cervical back: Neck supple.  ?Lymphadenopathy:  ?  Cervical: No cervical adenopathy.  ?Skin: ?   General: Skin is warm and dry.  ?Neurological:  ?   Mental Status: He is alert and oriented to person, place, and time.  ?Psychiatric:     ?   Mood and Affect: Mood normal.  ? ? ?ED Results / Procedures / Treatments   ?Labs ?(all labs ordered are listed, but only abnormal results are displayed) ?Labs Reviewed  ?CBC WITH DIFFERENTIAL/PLATELET - Abnormal; Notable for the following components:  ?    Result Value  ? RBC 3.68 (*)   ? Hemoglobin 12.3 (*)   ? HCT 35.4 (*)   ? All other components within normal limits  ?BASIC METABOLIC PANEL - Abnormal; Notable for the following components:  ? Glucose, Bld 174 (*)   ? BUN 25 (*)   ? Creatinine, Ser 1.47 (*)   ? GFR, Estimated 47 (*)   ? All other components  within normal limits  ?BRAIN NATRIURETIC PEPTIDE - Abnormal; Notable for the following components:  ? B Natriuretic Peptide 651.1 (*)   ? All other components within normal limits  ?D-DIMER, QUANTITATIVE - Abnormal; Notable for the following components:  ? D-Dimer, Quant 1.86 (*)   ? All other components within normal limits  ?TROPONIN I (HIGH SENSITIVITY) - Abnormal; Notable for the following components:  ? Troponin I (High Sensitivity) 125 (*)   ? All other components within normal limits  ?TROPONIN I (HIGH SENSITIVITY) - Abnormal; Notable for the following components:  ? Troponin I (High Sensitivity) 127 (*)   ? All other components within normal limits  ?URINALYSIS, ROUTINE W REFLEX MICROSCOPIC  ? ? ?EKG ?EKG Interpretation ? ?Date/Time:  Monday Oct 30 2021 22:26:53 EDT ?Ventricular Rate:  88 ?PR Interval:  166 ?QRS Duration: 74 ?QT Interval:  370 ?QTC Calculation: 447 ?R Axis:   73 ?Text Interpretation: Normal sinus rhythm Anterior infarct , age undetermined Abnormal ECG When compared with ECG of 03-May-2020 08:37, PREVIOUS ECG IS PRESENT Confirmed by Thayer Jew [phone]) on 10/31/2021 6:03:28 AM ? ?Radiology ?DG Chest 2 View ? ?Result Date: 10/30/2021 ?CLINICAL DATA:  Shortness of breath. EXAM: CHEST - 2 VIEW COMPARISON:  Chest x-ray 05/01/2020.  CT angiogram chest 05/01/2020. FINDINGS: Heart is borderline enlarged, unchanged. There is central pulmonary vascular congestion. There are interstitial opacities in the lower lungs, left greater than right. Small pleural effusions are present. There is no pneumothorax or acute fracture. IMPRESSION: 1. Central pulmonary vascular congestion with bilateral interstitial opacities favored is mild edema. 2. Small pleural effusions. Electronically Signed   By: Ronney Asters M.D.   On: 10/30/2021 22:47  ? ?CT Angio Chest PE W and/or Wo Contrast ? ?Result Date: 10/31/2021 ?CLINICAL DATA:  Positive D-dimer and suspected pulmonary embolism. EXAM: CT ANGIOGRAPHY CHEST WITH  CONTRAST TECHNIQUE: Multidetector CT imaging of the chest was performed using the standard protocol during bolus administration of intravenous contrast. Multiplanar CT image reconstructions and MIPs were obtained to evaluate the vascular anatomy. RADIATION DOSE REDUCTION: This exam was performed according to the departmental dose-optimization program which includes automated exposure control, adjustment of the mA and/or kV according to patient size and/or use of iterative reconstruction technique. CONTRAST:  72mL OMNIPAQUE IOHEXOL 350 MG/ML SOLN COMPARISON:  Chest CT no contrast 05/01/2020 FINDINGS: Cardiovascular: Pulmonary arteries are within normal caliber limits and clear through the segmental divisions. The subsegmental arterial bed is obscured by respiratory motion artifact. There is mild cardiomegaly with a slight right chamber predominance with RV/LV ratio of 1.25 IVC  and hepatic vein reflux consistent with right heart strain. There is prominence of the superior pulmonary veins. There is a small pericardial effusion. There are trace calcifications in the coronary arteries. There is aortic atherosclerosis without aneurysm with aortic opacification insufficient to assess the lumen. There are scattered calcifications of the great vessels. Mediastinum/Nodes: No enlarged mediastinal, hilar, or axillary lymph nodes. Thyroid gland, trachea, and esophagus demonstrate no significant findings. Lungs/Pleura: There are small layering right-greater-than-left pleural effusions. There is no pneumothorax. There is centrilobular emphysema predominating in the upper lobes. The central bronchi are moderately thickened in the bilateral upper and lower lobes and right middle lobe. There are small bronchial impactions in the posterior basal lower lobes and heterogeneous lower lobe opacities anterior to the pleural effusions which could be atelectasis or bronchopneumonia. There are numerous scattered nodules in the right upper  lobe with the smaller nodules centered around the hilar structures and slightly larger nodules in the periphery, largest of these is 9 mm on 7:86. On the left there are scattered peripheral interstitial micronodules in t

## 2021-10-31 NOTE — Discharge Instructions (Addendum)
You are seen today for posterior chest wall pain, occasional shortness of breath, cough.  Your work-up is notable for slight pulmonary edema.  You were given a dose of Lasix.  You may have some component of COPD as well.  Use your inhaler as needed for cough.  You have multiple pulmonary nodules that may be inflammatory in nature.  You will be discharged with an antibiotic.  Follow-up at the [facility] or primary physician for repeat chest CT in 3 months. ?

## 2021-10-31 NOTE — ED Notes (Signed)
Patient transported to CT 

## 2022-01-04 ENCOUNTER — Emergency Department (HOSPITAL_COMMUNITY)
Admission: EM | Admit: 2022-01-04 | Discharge: 2022-01-05 | Discharge status: Against Medical Advice | Payer: Medicare Other | Attending: Emergency Medicine | Admitting: Emergency Medicine

## 2022-01-04 ENCOUNTER — Other Ambulatory Visit: Payer: Self-pay

## 2022-01-04 DIAGNOSIS — M79672 Pain in left foot: Secondary | ICD-10-CM | POA: Diagnosis not present

## 2022-01-04 DIAGNOSIS — R630 Anorexia: Secondary | ICD-10-CM | POA: Diagnosis not present

## 2022-01-04 DIAGNOSIS — Z5321 Procedure and treatment not carried out due to patient leaving prior to being seen by health care provider: Secondary | ICD-10-CM | POA: Insufficient documentation

## 2022-01-04 DIAGNOSIS — M79671 Pain in right foot: Secondary | ICD-10-CM | POA: Diagnosis not present

## 2022-01-04 DIAGNOSIS — R5382 Chronic fatigue, unspecified: Secondary | ICD-10-CM | POA: Diagnosis not present

## 2022-01-04 DIAGNOSIS — R5383 Other fatigue: Secondary | ICD-10-CM | POA: Insufficient documentation

## 2022-01-04 LAB — CBC WITH DIFFERENTIAL/PLATELET
Abs Immature Granulocytes: 0.02 10*3/uL (ref 0.00–0.07)
Basophils Absolute: 0.1 10*3/uL (ref 0.0–0.1)
Basophils Relative: 1 %
Eosinophils Absolute: 0.1 10*3/uL (ref 0.0–0.5)
Eosinophils Relative: 1 %
HCT: 39.2 % (ref 39.0–52.0)
Hemoglobin: 13.6 g/dL (ref 13.0–17.0)
Immature Granulocytes: 0 %
Lymphocytes Relative: 24 %
Lymphs Abs: 1.7 10*3/uL (ref 0.7–4.0)
MCH: 32.5 pg (ref 26.0–34.0)
MCHC: 34.7 g/dL (ref 30.0–36.0)
MCV: 93.6 fL (ref 80.0–100.0)
Monocytes Absolute: 0.5 10*3/uL (ref 0.1–1.0)
Monocytes Relative: 7 %
Neutro Abs: 4.8 10*3/uL (ref 1.7–7.7)
Neutrophils Relative %: 67 %
Platelets: 153 10*3/uL (ref 150–400)
RBC: 4.19 MIL/uL — ABNORMAL LOW (ref 4.22–5.81)
RDW: 13.3 % (ref 11.5–15.5)
WBC: 7 10*3/uL (ref 4.0–10.5)
nRBC: 0 % (ref 0.0–0.2)

## 2022-01-04 LAB — BASIC METABOLIC PANEL
Anion gap: 6 (ref 5–15)
BUN: 61 mg/dL — ABNORMAL HIGH (ref 8–23)
CO2: 20 mmol/L — ABNORMAL LOW (ref 22–32)
Calcium: 9.3 mg/dL (ref 8.9–10.3)
Chloride: 110 mmol/L (ref 98–111)
Creatinine, Ser: 2.11 mg/dL — ABNORMAL HIGH (ref 0.61–1.24)
GFR, Estimated: 30 mL/min — ABNORMAL LOW (ref >60)
Glucose, Bld: 210 mg/dL — ABNORMAL HIGH (ref 70–99)
Potassium: 5.3 mmol/L — ABNORMAL HIGH (ref 3.5–5.1)
Sodium: 136 mmol/L (ref 135–145)

## 2022-01-04 NOTE — ED Provider Triage Note (Signed)
Emergency Medicine Provider Triage Evaluation Note  Vincent Baxter , a 87 y.o. male  was evaluated in triage.  Pt complains of fatigue, lack of appetite ongoing for at least a week.  Reports weight loss of about 30 to 40 pounds over the past 7 months.  Reports bilateral foot pain about a year.  Patient during interview appears comfortable without acute distress.  He is drinking lemonade.  Review of Systems  Positive: As above Negative: As above  Physical Exam  BP 116/84   Pulse 97   Temp 97.6 F (36.4 C) (Oral)   Resp 16   SpO2 100%  Gen:   Awake, no distress   Resp:  Normal effort  MSK:   Moves extremities without difficulty  Other:    Medical Decision Making  Medically screening exam initiated at 3:35 PM.  Appropriate orders placed.  Vincent Baxter was informed that the remainder of the evaluation will be completed by another provider, this initial triage assessment does not replace that evaluation, and the importance of remaining in the ED until their evaluation is complete.     Marita Kansas, PA-C 01/04/22 1536

## 2022-01-04 NOTE — ED Triage Notes (Signed)
Pt here for fatigue, loss of appetite and weight loss  x 2 weeks and bilateral foot pain. Pt states he has had problem with his arches for over a year but the pain is too bad, states it shoots up both legs. Pt denies injuries.

## 2022-03-21 ENCOUNTER — Encounter (HOSPITAL_COMMUNITY): Payer: Self-pay | Admitting: Emergency Medicine

## 2022-03-21 ENCOUNTER — Emergency Department (HOSPITAL_COMMUNITY)
Admission: EM | Admit: 2022-03-21 | Discharge: 2022-03-22 | Discharge status: Against Medical Advice | Payer: Medicare Other | Attending: Physician Assistant | Admitting: Physician Assistant

## 2022-03-21 DIAGNOSIS — R1030 Lower abdominal pain, unspecified: Secondary | ICD-10-CM | POA: Diagnosis not present

## 2022-03-21 DIAGNOSIS — R531 Weakness: Secondary | ICD-10-CM | POA: Insufficient documentation

## 2022-03-21 DIAGNOSIS — R300 Dysuria: Secondary | ICD-10-CM | POA: Insufficient documentation

## 2022-03-21 DIAGNOSIS — R1031 Right lower quadrant pain: Secondary | ICD-10-CM | POA: Insufficient documentation

## 2022-03-21 DIAGNOSIS — Z5321 Procedure and treatment not carried out due to patient leaving prior to being seen by health care provider: Secondary | ICD-10-CM | POA: Insufficient documentation

## 2022-03-21 LAB — CBC WITH DIFFERENTIAL/PLATELET
Abs Immature Granulocytes: 0.02 10*3/uL (ref 0.00–0.07)
Basophils Absolute: 0 10*3/uL (ref 0.0–0.1)
Basophils Relative: 1 %
Eosinophils Absolute: 0.1 10*3/uL (ref 0.0–0.5)
Eosinophils Relative: 1 %
HCT: 33 % — ABNORMAL LOW (ref 39.0–52.0)
Hemoglobin: 11.8 g/dL — ABNORMAL LOW (ref 13.0–17.0)
Immature Granulocytes: 0 %
Lymphocytes Relative: 31 %
Lymphs Abs: 2.2 10*3/uL (ref 0.7–4.0)
MCH: 33.9 pg (ref 26.0–34.0)
MCHC: 35.8 g/dL (ref 30.0–36.0)
MCV: 94.8 fL (ref 80.0–100.0)
Monocytes Absolute: 0.7 10*3/uL (ref 0.1–1.0)
Monocytes Relative: 9 %
Neutro Abs: 4.2 10*3/uL (ref 1.7–7.7)
Neutrophils Relative %: 58 %
Platelets: 146 10*3/uL — ABNORMAL LOW (ref 150–400)
RBC: 3.48 MIL/uL — ABNORMAL LOW (ref 4.22–5.81)
RDW: 13.5 % (ref 11.5–15.5)
WBC: 7.2 10*3/uL (ref 4.0–10.5)
nRBC: 0 % (ref 0.0–0.2)

## 2022-03-21 LAB — COMPREHENSIVE METABOLIC PANEL
ALT: 22 U/L (ref 0–44)
AST: 18 U/L (ref 15–41)
Albumin: 3.8 g/dL (ref 3.5–5.0)
Alkaline Phosphatase: 77 U/L (ref 38–126)
Anion gap: 8 (ref 5–15)
BUN: 50 mg/dL — ABNORMAL HIGH (ref 8–23)
CO2: 19 mmol/L — ABNORMAL LOW (ref 22–32)
Calcium: 9.2 mg/dL (ref 8.9–10.3)
Chloride: 115 mmol/L — ABNORMAL HIGH (ref 98–111)
Creatinine, Ser: 2.08 mg/dL — ABNORMAL HIGH (ref 0.61–1.24)
GFR, Estimated: 31 mL/min — ABNORMAL LOW (ref >60)
Glucose, Bld: 146 mg/dL — ABNORMAL HIGH (ref 70–99)
Potassium: 5.2 mmol/L — ABNORMAL HIGH (ref 3.5–5.1)
Sodium: 142 mmol/L (ref 135–145)
Total Bilirubin: 0.6 mg/dL (ref 0.3–1.2)
Total Protein: 6.5 g/dL (ref 6.5–8.1)

## 2022-03-21 LAB — LIPASE, BLOOD: Lipase: 35 U/L (ref 11–51)

## 2022-03-21 LAB — URINALYSIS, ROUTINE W REFLEX MICROSCOPIC
Bilirubin Urine: NEGATIVE
Glucose, UA: NEGATIVE mg/dL
Hgb urine dipstick: NEGATIVE
Ketones, ur: NEGATIVE mg/dL
Leukocytes,Ua: NEGATIVE
Nitrite: NEGATIVE
Protein, ur: NEGATIVE mg/dL
Specific Gravity, Urine: 1.011 (ref 1.005–1.030)
pH: 5 (ref 5.0–8.0)

## 2022-03-21 NOTE — ED Triage Notes (Signed)
Patient reports pain across abdomen radiating to right flank with fatigue / generalized weakness. Onset this week .

## 2022-03-21 NOTE — ED Provider Triage Note (Addendum)
Emergency Medicine Provider Triage Evaluation Note  ERLING ARRAZOLA , a 87 y.o. male  was evaluated in triage.  Pt complains of weakness.  States he feels weak all over.  Does note he has had approximately 24 hours of right flank pain into his diffuse right abd.  Also notes dysuria without gross hematuria.  Has not checked his temperature.  No chest pain, shortness of breath, cough. Hx of kidney stones  Review of Systems  Positive: Flank pain Negative: sob  Physical Exam  There were no vitals taken for this visit. Gen:   Awake, no distress   Resp:  Normal effort  MSK:   Moves extremities without difficulty  Other:    Medical Decision Making  Medically screening exam initiated at 9:26 PM.  Appropriate orders placed.  MAXI RODAS was informed that the remainder of the evaluation will be completed by another provider, this initial triage assessment does not replace that evaluation, and the importance of remaining in the ED until their evaluation is complete.  Flank pain, dysuria      Mykell Rawl A, PA-C 03/21/22 2128

## 2022-03-22 ENCOUNTER — Observation Stay (HOSPITAL_COMMUNITY)
Admission: EM | Admit: 2022-03-22 | Discharge: 2022-03-25 | Disposition: A | Discharge status: Home / Self Care | Payer: Medicare Other | Attending: Internal Medicine | Admitting: Internal Medicine

## 2022-03-22 ENCOUNTER — Emergency Department (HOSPITAL_COMMUNITY): Payer: Medicare Other

## 2022-03-22 ENCOUNTER — Encounter (HOSPITAL_COMMUNITY): Payer: Self-pay

## 2022-03-22 DIAGNOSIS — I5030 Unspecified diastolic (congestive) heart failure: Secondary | ICD-10-CM | POA: Insufficient documentation

## 2022-03-22 DIAGNOSIS — I130 Hypertensive heart and chronic kidney disease with heart failure and stage 1 through stage 4 chronic kidney disease, or unspecified chronic kidney disease: Secondary | ICD-10-CM | POA: Insufficient documentation

## 2022-03-22 DIAGNOSIS — M545 Low back pain, unspecified: Secondary | ICD-10-CM

## 2022-03-22 DIAGNOSIS — M4316 Spondylolisthesis, lumbar region: Secondary | ICD-10-CM | POA: Diagnosis not present

## 2022-03-22 DIAGNOSIS — I7143 Infrarenal abdominal aortic aneurysm, without rupture: Secondary | ICD-10-CM | POA: Diagnosis not present

## 2022-03-22 DIAGNOSIS — R0781 Pleurodynia: Secondary | ICD-10-CM | POA: Diagnosis not present

## 2022-03-22 DIAGNOSIS — Z79899 Other long term (current) drug therapy: Secondary | ICD-10-CM | POA: Diagnosis not present

## 2022-03-22 DIAGNOSIS — D696 Thrombocytopenia, unspecified: Secondary | ICD-10-CM | POA: Insufficient documentation

## 2022-03-22 DIAGNOSIS — F17210 Nicotine dependence, cigarettes, uncomplicated: Secondary | ICD-10-CM | POA: Insufficient documentation

## 2022-03-22 DIAGNOSIS — E872 Acidosis, unspecified: Secondary | ICD-10-CM | POA: Diagnosis not present

## 2022-03-22 DIAGNOSIS — E1122 Type 2 diabetes mellitus with diabetic chronic kidney disease: Secondary | ICD-10-CM

## 2022-03-22 DIAGNOSIS — Z043 Encounter for examination and observation following other accident: Secondary | ICD-10-CM | POA: Diagnosis not present

## 2022-03-22 DIAGNOSIS — R001 Bradycardia, unspecified: Secondary | ICD-10-CM | POA: Diagnosis not present

## 2022-03-22 DIAGNOSIS — J439 Emphysema, unspecified: Secondary | ICD-10-CM | POA: Diagnosis not present

## 2022-03-22 DIAGNOSIS — N200 Calculus of kidney: Secondary | ICD-10-CM | POA: Diagnosis not present

## 2022-03-22 DIAGNOSIS — N2889 Other specified disorders of kidney and ureter: Secondary | ICD-10-CM

## 2022-03-22 DIAGNOSIS — N1832 Chronic kidney disease, stage 3b: Secondary | ICD-10-CM | POA: Diagnosis not present

## 2022-03-22 DIAGNOSIS — R109 Unspecified abdominal pain: Secondary | ICD-10-CM | POA: Diagnosis not present

## 2022-03-22 DIAGNOSIS — M79672 Pain in left foot: Secondary | ICD-10-CM | POA: Insufficient documentation

## 2022-03-22 DIAGNOSIS — I10 Essential (primary) hypertension: Secondary | ICD-10-CM | POA: Diagnosis not present

## 2022-03-22 DIAGNOSIS — G8929 Other chronic pain: Secondary | ICD-10-CM

## 2022-03-22 DIAGNOSIS — R0789 Other chest pain: Secondary | ICD-10-CM | POA: Insufficient documentation

## 2022-03-22 DIAGNOSIS — I714 Abdominal aortic aneurysm, without rupture, unspecified: Secondary | ICD-10-CM | POA: Diagnosis not present

## 2022-03-22 DIAGNOSIS — E119 Type 2 diabetes mellitus without complications: Secondary | ICD-10-CM

## 2022-03-22 DIAGNOSIS — N183 Chronic kidney disease, stage 3 unspecified: Secondary | ICD-10-CM

## 2022-03-22 DIAGNOSIS — D649 Anemia, unspecified: Secondary | ICD-10-CM | POA: Diagnosis not present

## 2022-03-22 DIAGNOSIS — M79671 Pain in right foot: Secondary | ICD-10-CM | POA: Diagnosis not present

## 2022-03-22 DIAGNOSIS — Z7982 Long term (current) use of aspirin: Secondary | ICD-10-CM | POA: Insufficient documentation

## 2022-03-22 DIAGNOSIS — M5459 Other low back pain: Secondary | ICD-10-CM | POA: Diagnosis present

## 2022-03-22 LAB — URINALYSIS, ROUTINE W REFLEX MICROSCOPIC
Bilirubin Urine: NEGATIVE
Glucose, UA: NEGATIVE mg/dL
Hgb urine dipstick: NEGATIVE
Ketones, ur: NEGATIVE mg/dL
Leukocytes,Ua: NEGATIVE
Nitrite: NEGATIVE
Protein, ur: NEGATIVE mg/dL
Specific Gravity, Urine: 1.012 (ref 1.005–1.030)
pH: 5 (ref 5.0–8.0)

## 2022-03-22 LAB — CBC WITH DIFFERENTIAL/PLATELET
Abs Immature Granulocytes: 0.02 10*3/uL (ref 0.00–0.07)
Basophils Absolute: 0 10*3/uL (ref 0.0–0.1)
Basophils Relative: 0 %
Eosinophils Absolute: 0 10*3/uL (ref 0.0–0.5)
Eosinophils Relative: 1 %
HCT: 38.7 % — ABNORMAL LOW (ref 39.0–52.0)
Hemoglobin: 13.3 g/dL (ref 13.0–17.0)
Immature Granulocytes: 0 %
Lymphocytes Relative: 28 %
Lymphs Abs: 2.1 10*3/uL (ref 0.7–4.0)
MCH: 33.1 pg (ref 26.0–34.0)
MCHC: 34.4 g/dL (ref 30.0–36.0)
MCV: 96.3 fL (ref 80.0–100.0)
Monocytes Absolute: 0.6 10*3/uL (ref 0.1–1.0)
Monocytes Relative: 7 %
Neutro Abs: 4.9 10*3/uL (ref 1.7–7.7)
Neutrophils Relative %: 64 %
Platelets: 150 10*3/uL (ref 150–400)
RBC: 4.02 MIL/uL — ABNORMAL LOW (ref 4.22–5.81)
RDW: 13.5 % (ref 11.5–15.5)
WBC: 7.6 10*3/uL (ref 4.0–10.5)
nRBC: 0 % (ref 0.0–0.2)

## 2022-03-22 LAB — COMPREHENSIVE METABOLIC PANEL
ALT: 21 U/L (ref 0–44)
AST: 18 U/L (ref 15–41)
Albumin: 4.3 g/dL (ref 3.5–5.0)
Alkaline Phosphatase: 82 U/L (ref 38–126)
Anion gap: 6 (ref 5–15)
BUN: 48 mg/dL — ABNORMAL HIGH (ref 8–23)
CO2: 21 mmol/L — ABNORMAL LOW (ref 22–32)
Calcium: 9.3 mg/dL (ref 8.9–10.3)
Chloride: 112 mmol/L — ABNORMAL HIGH (ref 98–111)
Creatinine, Ser: 1.98 mg/dL — ABNORMAL HIGH (ref 0.61–1.24)
GFR, Estimated: 33 mL/min — ABNORMAL LOW (ref >60)
Glucose, Bld: 133 mg/dL — ABNORMAL HIGH (ref 70–99)
Potassium: 5 mmol/L (ref 3.5–5.1)
Sodium: 139 mmol/L (ref 135–145)
Total Bilirubin: 0.8 mg/dL (ref 0.3–1.2)
Total Protein: 7.6 g/dL (ref 6.5–8.1)

## 2022-03-22 LAB — LACTIC ACID, PLASMA: Lactic Acid, Venous: 1.4 mmol/L (ref 0.5–1.9)

## 2022-03-22 LAB — TROPONIN I (HIGH SENSITIVITY)
Troponin I (High Sensitivity): 140 ng/L (ref <18)
Troponin I (High Sensitivity): 130 ng/L (ref <18)

## 2022-03-22 MED ORDER — ACETAMINOPHEN 325 MG PO TABS
650.0000 mg | ORAL_TABLET | Freq: Four times a day (QID) | ORAL | Status: DC | PRN
  Administered 2022-03-22 – 2022-03-25 (×3): 650 mg via ORAL
  Filled 2022-03-22 (×3): qty 2

## 2022-03-22 NOTE — ED Provider Notes (Signed)
[facility]-EMERGENCY DEPT Provider Note   CSN: [id number] Arrival date & time: 03/22/22  1400     History  Chief Complaint  Patient presents with   Back Pain    Vincent Baxter is a 87 y.o. male.  Patient with a history of hypertension, diabetes not on medications currently presenting with right-sided low back pain ongoing for the past several months.  He is a poor historian.  He denies any fall or trauma injury.  He states the back is been hurting him for many months and he does not know was causing it.  She is frustrated by it.  It is in his right low back and sometimes radiates to his right groin and right abdomen.  Denies any previous back surgery.  He states he been working his garden a lot which makes the pain worse.  Denies any pain with urination or blood in the urine.  Has felt warm but not checked his temperature.  States has abdominal pain to that spreads diffusely.  He denies any weakness in his legs.  He denies any numbness or tingling.  He denies any history of IV drug abuse or cancer.  States he occasionally notices dribbling urine sometimes but does not have any frank incontinence of bowel or bladder.  The pain is to his right low back and spreads diffusely across his flank and abdomen.  No testicular pain. No chest pain or shortness of breath.  He was found to be bradycardic in the 40s in triage  The history is provided by the patient.  Back Pain Associated symptoms: dysuria and weakness   Associated symptoms: no chest pain and no fever        Home Medications Prior to Admission medications   Medication Sig Start Date End Date Taking? Authorizing Provider  aspirin EC 81 MG EC tablet Take 1 tablet (81 mg total) by mouth daily. Swallow whole. Patient not taking: Reported on 10/31/2021 05/05/20   Arrien, York Ram, MD  atorvastatin (LIPITOR) 20 MG tablet Take 1 tablet (20 mg total) by mouth at bedtime. Patient not taking: Reported on 10/31/2021  05/04/20 06/03/20  Arrien, York Ram, MD  doxycycline (VIBRAMYCIN) 100 MG capsule Take 1 capsule (100 mg total) by mouth 2 (two) times daily. 10/31/21   Horton, Mayer Masker, MD  furosemide (LASIX) 20 MG tablet Take 1 tablet (20 mg total) by mouth daily as needed for up to 20 days for fluid or edema (Take as needed for lower extremity swelling or shortness of breath.). Patient not taking: Reported on 10/31/2021 05/04/20 05/24/20  Arrien, York Ram, MD  metoprolol tartrate (LOPRESSOR) 25 MG tablet Take 1 tablet (25 mg total) by mouth 2 (two) times daily. Patient not taking: Reported on 10/31/2021 05/04/20 06/03/20  Arrien, York Ram, MD      Allergies    Patient has no known allergies.    Review of Systems   Review of Systems  Constitutional:  Negative for activity change, appetite change and fever.  HENT:  Negative for congestion and rhinorrhea.   Respiratory:  Negative for chest tightness.   Cardiovascular:  Negative for chest pain.  Gastrointestinal:  Negative for nausea and vomiting.  Genitourinary:  Positive for dysuria. Negative for hematuria and testicular pain.  Musculoskeletal:  Positive for back pain.  Neurological:  Positive for weakness.   all other systems are negative except as noted in the HPI and PMH.    Physical Exam Updated Vital Signs BP (!) 126/95 (BP  Location: Left Arm)   Pulse (!) 42   Temp (!) 97.5 F (36.4 C) (Oral)   Resp 16   SpO2 100%  Physical Exam Vitals and nursing note reviewed.  Constitutional:      General: He is not in acute distress.    Appearance: Normal appearance. He is well-developed and normal weight. He is not ill-appearing.  HENT:     Head: Normocephalic and atraumatic.     Mouth/Throat:     Pharynx: No oropharyngeal exudate.  Eyes:     Conjunctiva/sclera: Conjunctivae normal.     Pupils: Pupils are equal, round, and reactive to light.  Neck:     Comments: No meningismus. Cardiovascular:     Rate and Rhythm: Normal  rate and regular rhythm.     Heart sounds: Normal heart sounds. No murmur heard. Pulmonary:     Effort: Pulmonary effort is normal. No respiratory distress.     Breath sounds: Normal breath sounds.  Chest:     Chest wall: No tenderness.  Abdominal:     Palpations: Abdomen is soft.     Tenderness: There is no abdominal tenderness. There is no guarding or rebound.     Comments: Equal femoral pulses.  Musculoskeletal:        General: Tenderness present. Normal range of motion.     Cervical back: Normal range of motion and neck supple.     Comments: R CVAT  5/5 strength in bilateral lower extremities. Ankle plantar and dorsiflexion intact. Great toe extension intact bilaterally. +2 DP and PT pulses. Unable to elicit patellar reflexes bilaterally. Normal gait.   Skin:    General: Skin is warm.  Neurological:     Mental Status: He is alert and oriented to person, place, and time.     Cranial Nerves: No cranial nerve deficit.     Motor: No abnormal muscle tone.     Coordination: Coordination normal.     Comments:  5/5 strength throughout. CN 2-12 intact.Equal grip strength.   Psychiatric:        Behavior: Behavior normal.     ED Results / Procedures / Treatments   Labs (all labs ordered are listed, but only abnormal results are displayed) Labs Reviewed  CBC WITH DIFFERENTIAL/PLATELET - Abnormal; Notable for the following components:      Result Value   RBC 4.02 (*)    HCT 38.7 (*)    All other components within normal limits  COMPREHENSIVE METABOLIC PANEL - Abnormal; Notable for the following components:   Chloride 112 (*)    CO2 21 (*)    Glucose, Bld 133 (*)    BUN 48 (*)    Creatinine, Ser 1.98 (*)    GFR, Estimated 33 (*)    All other components within normal limits  TROPONIN I (HIGH SENSITIVITY) - Abnormal; Notable for the following components:   Troponin I (High Sensitivity) 140 (*)    All other components within normal limits  TROPONIN I (HIGH SENSITIVITY) -  Abnormal; Notable for the following components:   Troponin I (High Sensitivity) 130 (*)    All other components within normal limits  URINE CULTURE  CULTURE, BLOOD (ROUTINE X 2)  CULTURE, BLOOD (ROUTINE X 2)  URINALYSIS, ROUTINE W REFLEX MICROSCOPIC  LACTIC ACID, PLASMA  RAPID URINE DRUG SCREEN, HOSP PERFORMED    EKG EKG Interpretation  Date/Time:  Thursday March 22 2022 20:28:49 EDT Ventricular Rate:  82 PR Interval:  146 QRS Duration: 78 QT Interval:  378 QTC  Calculation: 441 R Axis:   25 Text Interpretation: Sinus rhythm with frequent and consecutive Premature ventricular complexes Cannot rule out Anterior infarct , age undetermined Abnormal ECG When compared with ECG of 30-Oct-2021 22:26, PREVIOUS ECG IS PRESENT No significant change was found Confirmed by Ezequiel Essex [phone]) on 03/22/2022 8:43:48 PM  Radiology CT ABDOMEN PELVIS WO CONTRAST  Result Date: 03/22/2022 CLINICAL DATA:  Right-sided flank pain, initial encounter EXAM: CT ABDOMEN AND PELVIS WITHOUT CONTRAST TECHNIQUE: Multidetector CT imaging of the abdomen and pelvis was performed following the standard protocol without IV contrast. RADIATION DOSE REDUCTION: This exam was performed according to the departmental dose-optimization program which includes automated exposure control, adjustment of the mA and/or kV according to patient size and/or use of iterative reconstruction technique. COMPARISON:  05/01/2020 FINDINGS: Lower chest: Stable scarring is noted in the lower lobes bilaterally. Hepatobiliary: No focal liver abnormality is seen. No gallstones, gallbladder wall thickening, or biliary dilatation. Pancreas: Unremarkable. No pancreatic ductal dilatation or surrounding inflammatory changes. Spleen: Normal in size without focal abnormality. Adrenals/Urinary Tract: Adrenal glands are within normal limits. Kidneys show no obstructive changes. The right kidney is atrophic with multiple lower pole renal stones stable in  appearance from the prior exam. Small right renal cyst is noted stable from the prior exam. No follow-up is recommended. The bladder is partially distended. Stomach/Bowel: No obstructive or inflammatory changes of the colon are noted. The appendix is not visualized. No inflammatory changes to suggest appendicitis are noted. Fluid is noted within the small bowel although no obstructive changes are seen. The stomach is decompressed. Vascular/Lymphatic: Persistent 3.9 cm infrarenal aortic aneurysm stable from the prior study. No adenopathy is noted. Reproductive: Prostate is unremarkable. Other: No abdominal wall hernia or abnormality. No abdominopelvic ascites. Musculoskeletal: No acute or significant osseous findings. IMPRESSION: No acute abnormality is identified. Stable 3.9 cm infrarenal abdominal aortic aneurysm. Recommend follow-up every 2 years. Reference: J Am Coll Radiol 1856;31:[id number]. Electronically Signed   By: Inez Catalina M.D.   On: 03/22/2022 22:43   DG Lumbar Spine Complete  Result Date: 03/22/2022 CLINICAL DATA:  Lower back pain. EXAM: LUMBAR SPINE - COMPLETE 4+ VIEW COMPARISON:  May 03, 2020. FINDINGS: There is no evidence of lumbar spine fracture. Alignment is normal. Intervertebral disc spaces are maintained. IMPRESSION: Negative. Electronically Signed   By: Marijo Conception M.D.   On: 03/22/2022 16:22   DG HIP UNILAT WITH PELVIS 2-3 VIEWS RIGHT  Result Date: 03/22/2022 CLINICAL DATA:  Fall. EXAM: DG HIP (WITH OR WITHOUT PELVIS) 2-3V RIGHT COMPARISON:  None Available. FINDINGS: There is no evidence of hip fracture or dislocation. There is no evidence of arthropathy or other focal bone abnormality. IMPRESSION: Negative. Electronically Signed   By: Marijo Conception M.D.   On: 03/22/2022 16:21    Procedures Procedures    Medications Ordered in ED Medications  acetaminophen (TYLENOL) tablet 650 mg (650 mg Oral Given 03/22/22 1449)    ED Course/ Medical Decision Making/ A&P                            Medical Decision Making Amount and/or Complexity of Data Reviewed Labs: ordered. Decision-making details documented in ED Course. Radiology: ordered and independent interpretation performed. Decision-making details documented in ED Course. ECG/medicine tests: ordered and independent interpretation performed. Decision-making details documented in ED Course.  Risk Decision regarding hospitalization.  Right flank pain ongoing for many months.  No weakness, numbness or tingling.  Intact distal strength, sensation and pulses, unable to elicit reflexes bilaterally.  Patient with bradycardia in the 40s. EKG shows sinus with some PVCs and bigeminy. Patient without CP, SOB, dizziness. He states he does not take beta blockers or any other medications.  Urinalysis negative for infection or hematuria.  CT scan obtained given patient's known history of aortic aneurysm.  We will also evaluate for kidney stone.  CT shows no obstructive uropathy.  Abdominal aneurysm appears to be stable at 3.8 cm  Creatinine 1.98 which is near his baseline.  Troponin 140.  No chest pain  CT shows no kidney stone or other acute pathology. Patient made aware of 3.9 cm AAA and need for followup. No indication of rupture today.  Troponin appears chronically elevated. He denies CP. Will trend.  Continues with bradycardia and frequent PVCs.   MRI to be obtained given ongoing low back pain with questionable incontinence. Does not appear related to AAA.  Plan observation admission given elevated troponin and bradycardia. MRI pending. D/w Dr. Cyndia Bent. .       Final Clinical Impression(s) / ED Diagnoses Final diagnoses:  Chronic right-sided low back pain without sciatica  Bradycardia    Rx / DC Orders ED Discharge Orders     None         Maie Kesinger, Jeannett Senior, MD 03/23/22 0111

## 2022-03-22 NOTE — ED Notes (Signed)
This Probation officer called the pt's name 2x with no response

## 2022-03-22 NOTE — ED Notes (Signed)
Pt to MRI at this time.

## 2022-03-22 NOTE — ED Triage Notes (Signed)
Pt presents with c/o lower right back pain that he says does radiate to his lower abdomen. Pt also c/o some weakness in those areas. Pt went to [facility] for same yesterday. Pt reports these symptoms have been present for years.

## 2022-03-22 NOTE — ED Notes (Signed)
Patient transported to CT 

## 2022-03-22 NOTE — ED Notes (Signed)
This Probation officer called pt's name and received no response

## 2022-03-22 NOTE — ED Notes (Signed)
Urine sent down to lab

## 2022-03-22 NOTE — ED Notes (Addendum)
PT came to get patient but couldn't find them and notified this Probation officer

## 2022-03-22 NOTE — ED Notes (Signed)
Pt moved from hall B to [place] [address] so he may be connected to a cardiac monitoring device. Pt denies any CP, SOB, or pain at this time, back pain has improved. Pt reports feels fatigue at times. Pt resting, NAD noted, observed even RR and unlabored, side rails up x2 for safety, plan of care ongoing, pt expresses no needs or concerns at this time, call light within reach, no further concerns as of present.

## 2022-03-22 NOTE — ED Provider Triage Note (Signed)
Emergency Medicine Provider Triage Evaluation Note  Vincent Baxter , a 87 y.o. male  was evaluated in triage.  Pt complains of lower back pain. Located right lower back pain. It's more sore  than painful. Sometimes the soreness extends down the right leg and groin. Started about a year ago. Pain is worsening impacting his ability to walk prompting him to be evaluated. Endorses trauma to the right lower back that occurred several year ago. Not taking any medications. States that he works in his garden a lot which requires bending over which makes his back pain worse. No hematuria.   Review of Systems  Positive: See above Negative: See above  Physical Exam  BP (!) 142/72 (BP Location: Left Arm)   Pulse (!) 45   Temp 97.6 F (36.4 C) (Oral)   Resp 20   SpO2 100%  Gen:   Awake, no distress   Resp:  Normal effort  MSK:   Moves extremities without difficulty  Other:    Medical Decision Making  Medically screening exam initiated at 2:27 PM.  Appropriate orders placed.  SHAYMUS EVELETH was informed that the remainder of the evaluation will be completed by another provider, this initial triage assessment does not replace that evaluation, and the importance of remaining in the ED until their evaluation is complete.  Delfin Edis, PA-C 03/22/22 1436

## 2022-03-22 NOTE — ED Notes (Signed)
Pt has 3 personal belonging's bag placed at bedside. Pt informed this Probation officer that his license, wallet, car keys, and retirement ID are with him. Pt placed these items in his sweatshirt and folded his sweatshirt and placed in bag. All bags are labeled.

## 2022-03-22 NOTE — ED Notes (Signed)
Pt return from MRI, NAD noted, pt A&O x4.

## 2022-03-23 ENCOUNTER — Observation Stay (HOSPITAL_COMMUNITY): Payer: Medicare Other

## 2022-03-23 ENCOUNTER — Observation Stay (HOSPITAL_BASED_OUTPATIENT_CLINIC_OR_DEPARTMENT_OTHER): Payer: Medicare Other

## 2022-03-23 ENCOUNTER — Other Ambulatory Visit: Payer: Self-pay

## 2022-03-23 DIAGNOSIS — N2889 Other specified disorders of kidney and ureter: Secondary | ICD-10-CM | POA: Diagnosis not present

## 2022-03-23 DIAGNOSIS — M19071 Primary osteoarthritis, right ankle and foot: Secondary | ICD-10-CM | POA: Diagnosis not present

## 2022-03-23 DIAGNOSIS — I10 Essential (primary) hypertension: Secondary | ICD-10-CM | POA: Diagnosis not present

## 2022-03-23 DIAGNOSIS — I517 Cardiomegaly: Secondary | ICD-10-CM | POA: Diagnosis not present

## 2022-03-23 DIAGNOSIS — M79671 Pain in right foot: Secondary | ICD-10-CM | POA: Diagnosis not present

## 2022-03-23 DIAGNOSIS — I7143 Infrarenal abdominal aortic aneurysm, without rupture: Secondary | ICD-10-CM | POA: Diagnosis not present

## 2022-03-23 DIAGNOSIS — R001 Bradycardia, unspecified: Secondary | ICD-10-CM | POA: Diagnosis not present

## 2022-03-23 DIAGNOSIS — N183 Chronic kidney disease, stage 3 unspecified: Secondary | ICD-10-CM | POA: Diagnosis not present

## 2022-03-23 DIAGNOSIS — R0781 Pleurodynia: Secondary | ICD-10-CM | POA: Diagnosis not present

## 2022-03-23 DIAGNOSIS — R9431 Abnormal electrocardiogram [ECG] [EKG]: Secondary | ICD-10-CM | POA: Diagnosis not present

## 2022-03-23 DIAGNOSIS — M19072 Primary osteoarthritis, left ankle and foot: Secondary | ICD-10-CM | POA: Diagnosis not present

## 2022-03-23 DIAGNOSIS — E1122 Type 2 diabetes mellitus with diabetic chronic kidney disease: Secondary | ICD-10-CM | POA: Diagnosis not present

## 2022-03-23 DIAGNOSIS — R0789 Other chest pain: Secondary | ICD-10-CM

## 2022-03-23 DIAGNOSIS — J439 Emphysema, unspecified: Secondary | ICD-10-CM | POA: Diagnosis not present

## 2022-03-23 DIAGNOSIS — M79672 Pain in left foot: Secondary | ICD-10-CM | POA: Diagnosis not present

## 2022-03-23 DIAGNOSIS — I700 Atherosclerosis of aorta: Secondary | ICD-10-CM | POA: Diagnosis not present

## 2022-03-23 DIAGNOSIS — M2011 Hallux valgus (acquired), right foot: Secondary | ICD-10-CM | POA: Diagnosis not present

## 2022-03-23 DIAGNOSIS — R222 Localized swelling, mass and lump, trunk: Secondary | ICD-10-CM | POA: Diagnosis not present

## 2022-03-23 LAB — COMPREHENSIVE METABOLIC PANEL
ALT: 19 U/L (ref 0–44)
AST: 19 U/L (ref 15–41)
Albumin: 3.8 g/dL (ref 3.5–5.0)
Alkaline Phosphatase: 71 U/L (ref 38–126)
Anion gap: 7 (ref 5–15)
BUN: 49 mg/dL — ABNORMAL HIGH (ref 8–23)
CO2: 20 mmol/L — ABNORMAL LOW (ref 22–32)
Calcium: 9 mg/dL (ref 8.9–10.3)
Chloride: 111 mmol/L (ref 98–111)
Creatinine, Ser: 2 mg/dL — ABNORMAL HIGH (ref 0.61–1.24)
GFR, Estimated: 32 mL/min — ABNORMAL LOW (ref >60)
Glucose, Bld: 153 mg/dL — ABNORMAL HIGH (ref 70–99)
Potassium: 4.9 mmol/L (ref 3.5–5.1)
Sodium: 138 mmol/L (ref 135–145)
Total Bilirubin: 0.9 mg/dL (ref 0.3–1.2)
Total Protein: 6.6 g/dL (ref 6.5–8.1)

## 2022-03-23 LAB — CBC WITH DIFFERENTIAL/PLATELET
Abs Immature Granulocytes: 0.02 10*3/uL (ref 0.00–0.07)
Basophils Absolute: 0.1 10*3/uL (ref 0.0–0.1)
Basophils Relative: 1 %
Eosinophils Absolute: 0.1 10*3/uL (ref 0.0–0.5)
Eosinophils Relative: 1 %
HCT: 34.7 % — ABNORMAL LOW (ref 39.0–52.0)
Hemoglobin: 11.7 g/dL — ABNORMAL LOW (ref 13.0–17.0)
Immature Granulocytes: 0 %
Lymphocytes Relative: 29 %
Lymphs Abs: 2 10*3/uL (ref 0.7–4.0)
MCH: 32.8 pg (ref 26.0–34.0)
MCHC: 33.7 g/dL (ref 30.0–36.0)
MCV: 97.2 fL (ref 80.0–100.0)
Monocytes Absolute: 0.7 10*3/uL (ref 0.1–1.0)
Monocytes Relative: 10 %
Neutro Abs: 4.1 10*3/uL (ref 1.7–7.7)
Neutrophils Relative %: 59 %
Platelets: 141 10*3/uL — ABNORMAL LOW (ref 150–400)
RBC: 3.57 MIL/uL — ABNORMAL LOW (ref 4.22–5.81)
RDW: 13.4 % (ref 11.5–15.5)
WBC: 7 10*3/uL (ref 4.0–10.5)
nRBC: 0 % (ref 0.0–0.2)

## 2022-03-23 LAB — TROPONIN I (HIGH SENSITIVITY)
Troponin I (High Sensitivity): 122 ng/L (ref <18)
Troponin I (High Sensitivity): 128 ng/L (ref <18)

## 2022-03-23 LAB — RAPID URINE DRUG SCREEN, HOSP PERFORMED
Amphetamines: NOT DETECTED
Barbiturates: NOT DETECTED
Benzodiazepines: NOT DETECTED
Cocaine: NOT DETECTED
Opiates: NOT DETECTED
Tetrahydrocannabinol: POSITIVE — AB

## 2022-03-23 LAB — MAGNESIUM: Magnesium: 1.8 mg/dL (ref 1.7–2.4)

## 2022-03-23 LAB — URINE CULTURE: Culture: NO GROWTH

## 2022-03-23 LAB — ECHOCARDIOGRAM COMPLETE
AR max vel: 2.36 cm2
AV Peak grad: 3.5 mmHg
Ao pk vel: 0.94 m/s
Area-P 1/2: 4.65 cm2
S' Lateral: 2.4 cm

## 2022-03-23 LAB — PHOSPHORUS: Phosphorus: 4 mg/dL (ref 2.5–4.6)

## 2022-03-23 MED ORDER — ENOXAPARIN SODIUM 30 MG/0.3ML IJ SOSY
30.0000 mg | PREFILLED_SYRINGE | INTRAMUSCULAR | Status: DC
  Administered 2022-03-23 – 2022-03-25 (×3): 30 mg via SUBCUTANEOUS
  Filled 2022-03-23 (×3): qty 0.3

## 2022-03-23 MED ORDER — ORAL CARE MOUTH RINSE: 15.0000 mL | OROMUCOSAL | Status: DC | PRN

## 2022-03-23 NOTE — ED Notes (Signed)
Echo in process 

## 2022-03-23 NOTE — ED Notes (Signed)
Pt appears to be sleeping, observe even RR and unlabored, NAD noted, side rails up x2 for safety, plan of care ongoing, call light within reach, no further concerns as of present.   

## 2022-03-23 NOTE — Assessment & Plan Note (Signed)
Pt had CT A/P and MRI L spine in ED because he was thought to have flank pain but on exam pt points to a focal nodule on lower right rib cage. Will obtain dedicated rib X-ray. Suspect likely bony overgrowth or lipoma.  -MRI L-spine did show reactive marrow edema around L4-5 but he denies any significant pain there.

## 2022-03-23 NOTE — Assessment & Plan Note (Signed)
No hyperglycemia. Follow with morning labs.

## 2022-03-23 NOTE — ED Notes (Signed)
Pt reposition for comfort, pt given coke to drink upon request. Pt resting, NAD noted, observed even RR and unlabored, side rails up x2 for safety, plan of care ongoing, pt expresses no needs or concerns at this time, call light within reach, no further concerns as of present. Urinal at the bedside

## 2022-03-23 NOTE — Assessment & Plan Note (Addendum)
Noted on CT A/P around 3.9 but on MRI L-spine this is around 4.1cm -need yearly follow up imaging

## 2022-03-23 NOTE — Assessment & Plan Note (Addendum)
Stable. Has ongoing tobacco use. Encourage cessation.

## 2022-03-23 NOTE — H&P (Signed)
History and Physical    Patient: Vincent Baxter ZSW:[id number] DOB: 10-08-1934 DOA: 03/22/2022 DOS: the patient was seen and examined on 03/23/2022 PCP: Patient, No Pcp Per  Patient coming from: Home  Chief Complaint:  Chief Complaint  Patient presents with   Back Pain   HPI: Vincent Baxter is a 87 y.o. male with medical history significant of HTN, CHF, COPD, T2DM who presents with back pain.  Pt reports pain is actually a nodule on his right lower rib cage worse when laying on it. Has been there at least 6 months. No hx of rib fractures or trauma. Also has bilateral lower ankle stiffness and trouble ambulating at times. Denies any chest pain, shortness of breath.   In the ED, he was noted to be bradycardic into th 30s but EKG shows frequent PVCs.   WBC 7.6, HGB 13.3, PLT 150  NA 139, K 5, creatinine stable at 1.98  Troponin at 140->130  UDS is positive for THC. UA negative.   CT A/P non contrast showed stable AAA and no other acute findings.   MRI L spine with degenerative changes, lower lumbar facet arthritis with associated reactive marrow edema, most pronounced at L4-5.   Review of Systems: As mentioned in the history of present illness. All other systems reviewed and are negative. Past Medical History:  Diagnosis Date   Diabetes mellitus 01/2009 dx   DYSLIPIDEMIA    GERD    Hypertension    Kidney stones    RENAL CALCULUS 01/04/2009   Past Surgical History:  Procedure Laterality Date   NO PAST SURGERIES     Social History:  reports that he has been smoking cigarettes. He has been smoking an average of .5 packs per day. He has never used smokeless tobacco. He reports that he does not drink alcohol and does not use drugs.  No Known Allergies  Family History  Problem Relation Age of Onset   Hypertension Mother     Prior to Admission medications   Medication Sig Start Date End Date Taking? Authorizing Provider  aspirin EC 81 MG EC tablet Take 1 tablet (81 mg  total) by mouth daily. Swallow whole. Patient not taking: Reported on 10/31/2021 05/05/20   Arrien, York Ram, MD  atorvastatin (LIPITOR) 20 MG tablet Take 1 tablet (20 mg total) by mouth at bedtime. Patient not taking: Reported on 10/31/2021 05/04/20 06/03/20  Arrien, York Ram, MD  doxycycline (VIBRAMYCIN) 100 MG capsule Take 1 capsule (100 mg total) by mouth 2 (two) times daily. Patient not taking: Reported on 03/22/2022 10/31/21   Horton, Mayer Masker, MD  furosemide (LASIX) 20 MG tablet Take 1 tablet (20 mg total) by mouth daily as needed for up to 20 days for fluid or edema (Take as needed for lower extremity swelling or shortness of breath.). Patient not taking: Reported on 10/31/2021 05/04/20 05/24/20  Arrien, York Ram, MD  metoprolol tartrate (LOPRESSOR) 25 MG tablet Take 1 tablet (25 mg total) by mouth 2 (two) times daily. Patient not taking: Reported on 10/31/2021 05/04/20 06/03/20  Arrien, York Ram, MD    Physical Exam: Vitals:   03/22/22 2300 03/23/22 0005 03/23/22 0045 03/23/22 0200  BP: 114/70 129/74 111/73 117/74  Pulse: 92 85 88 81  Resp: 17 18 (!) 21 16  Temp:  98.5 F (36.9 C)    TempSrc:      SpO2: 100% 100% 100% 100%   Constitutional: NAD, calm, comfortable, thin cachectic elderly male.  Stated age sitting upright  in bed Eyes: lids and conjunctivae normal ENMT: Mucous membranes are moist. Neck: normal, supple Respiratory: clear to auscultation bilaterally, no wheezing, no crackles. Normal respiratory effort. No accessory muscle use.  Cardiovascular: Regular rate and rhythm, no murmurs / rubs / gallops. No extremity edema.  Abdomen: no tenderness, no masses palpated. Bowel sounds positive.  Musculoskeletal: no clubbing / cyanosis.  Muscle wasting on all 4 extremities.   Skin: Patient with firm, somewhat multiple nodule felt right lateral lower rib cage Neurologic: CN 2-12 grossly intact.Strength 5/5 in all 4.  Psychiatric: Normal judgment and  insight. Alert and oriented x 3. Normal mood. Data Reviewed:  See HPI  Assessment and Plan: * Bradycardia HR into 30s but frequent bigeminy seen on EKG could account for this. However troponin is elevated. Continue to trend -obtain echocardiogram -keep on continuous telemetry  Rib pain Pt had CT A/P and MRI L spine in ED because he was thought to have flank pain but on exam pt points to a focal nodule on lower right rib cage. Will obtain dedicated rib X-ray. Suspect likely bony overgrowth or lipoma.  -MRI L-spine did show reactive marrow edema around L4-5 but he denies any significant pain there.  Renal mass Questionable 1.7 cm heterogeneous exophytic mass extending from the posterior right kidney, indeterminate.  Further assessment with dedicated renal mass protocol CT and/or MRI recommended for further characterization.  AAA (abdominal aortic aneurysm) (HCC) Noted on CT A/P around 3.9 but on MRI L-spine this is around 4.1cm -need yearly follow up imaging  COPD (chronic obstructive pulmonary disease) with emphysema (HCC) Stable. Has ongoing tobacco use. Encourage cessation.  Essential hypertension Controlled. Not on antihypertensives  DM2 (diabetes mellitus, type 2) (HCC) No hyperglycemia. Follow with morning labs.      Advance Care Planning:   Code Status: Full Code   Consults: none  Family Communication: none at bedside  Severity of Illness: The appropriate patient status for this patient is OBSERVATION. Observation status is judged to be reasonable and necessary in order to provide the required intensity of service to ensure the patient's safety. The patient's presenting symptoms, physical exam findings, and initial radiographic and laboratory data in the context of their medical condition is felt to place them at decreased risk for further clinical deterioration. Furthermore, it is anticipated that the patient will be medically stable for discharge from the hospital  within 2 midnights of admission.   Author: Orene Desanctis, DO 03/23/2022 2:50 AM  For on call review [email or web address].

## 2022-03-23 NOTE — Progress Notes (Signed)
PROGRESS NOTE    Vincent Baxter  QPY:[id number] DOB: Jun 13, 1935 DOA: 03/22/2022 PCP: Patient, No Pcp Per   Brief Narrative:  HPI per Dr. Ileene Musa on 03/22/22 [name] is a 87 y.o. male with medical history significant of HTN, CHF, COPD, T2DM who presents with back pain.   Pt reports pain is actually a nodule on his right lower rib cage worse when laying on it. Has been there at least 6 months. No hx of rib fractures or trauma. Also has bilateral lower ankle stiffness and trouble ambulating at times. Denies any chest pain, shortness of breath.    In the ED, he was noted to be bradycardic into th 30s but EKG shows frequent PVCs.    WBC 7.6, HGB 13.3, PLT 150   NA 139, K 5, creatinine stable at 1.98   Troponin at 140->130   UDS is positive for THC. UA negative.    CT A/P non contrast showed stable AAA and no other acute findings.    MRI L spine with degenerative changes, lower lumbar facet arthritis with associated reactive marrow edema, most pronounced at L4-5.   **Interim History Patient was complaining of foot pain and stiffness will get foot x-rays.  His bradycardia has improved and echocardiogram was obtained.  Will need PT and OT to further evaluate and treat   Assessment and Plan: * Bradycardia -HR into 30s but frequent bigeminy seen on EKG could account for this.  -However troponin is elevated. Continue to trend but has been relatively flat on from 130 down to 122 and is now 128 -obtain echocardiogram and showed "Compared to prior TTE in 04/2020, the LVEF has dropped to  40-45% (previously 50-55%). There is severe LVH. Consider CMR for further work-up for possible HCM vs amyloidosis if clinically indicated. " -keep on continuous telemetry -We will consult cardiology in the a.m. given as concern for HCM versus amyloidosis  Rib pain -Pt had CT A/P and MRI L spine in ED because he was thought to have flank pain but on exam pt points to a focal nodule on lower right rib  cage. Will obtain dedicated rib X-ray. Suspect likely bony overgrowth or lipoma.  -MRI L-spine did show reactive marrow edema around L4-5 but he denies any significant pain there.  Renal mass -Questionable 1.7 cm heterogeneous exophytic mass extending from the posterior right kidney, indeterminate.  -Further assessment with dedicated renal mass protocol CT and/or MRI recommended for further characterization and this can be done in outpatient setting  AAA (abdominal aortic aneurysm) ([place]) -Noted on CT A/P around 3.9 but on MRI L-spine this is around 4.1cm -need yearly follow up imaging  COPD (chronic obstructive pulmonary disease) with emphysema ([place]) -Stable. Has ongoing tobacco use. Encourage cessation.  Essential hypertension Controlled. Not on antihypertensives  DM2 (diabetes mellitus, type 2) ([facility]) No hyperglycemia. Follow with morning labs and if necessary will place on sensitive NovoLog sign scale insulin AC; blood sugars are ranging from 111-115  Chronic kidney disease stage IIIb Metabolic Acidosis -Patient's BUN/creatinine went from 50/2.08 and is now 49/2.00 -Patient has a slight metabolic acidosis with a CO2 of 20, anion gap of 7, chloride level 111 Avoid further nephrotoxic medications, contrast dyes, hypotension and dehydration to ensure adequate renal perfusion and will need to renally dose medications Repeat CMP in the a.m.  Normocytic anemia -The patient hemoglobin/hematocrit is now 11.7/34.7 -Check anemia panel in the a.m. -Continue monitor for signs and symptoms bleeding -Repeat CBC in the a.m.  Thrombocytopenia -Patient's platelet count is now 141 -Continue to monitor for signs of bleeding; -No overt bleeding noted  THC abuse -Tested positive on UDS -Substance abuse counseling given  Bilateral foot pain and stiffness -Obtain x-rays of the foot and ankle -Left ankle complete done and showed "No fracture or dislocation is seen. Degenerative changes are  noted with bony spurs in medial malleolus and intertarsal joints" -Right ankle completed showed "No recent fracture is seen. Degenerative changes are noted with bony spurs in medial malleolus and dorsal aspect of talonavicular joint" -Left foot showed "No fracture or dislocation is seen. Degenerative changes are noted in first metatarsophalangeal joint." -Right foot completed showed "No recent fracture or dislocation is seen. Degenerative changes are noted in first metatarsophalangeal joint and talonavicular joint."  DVT prophylaxis: enoxaparin (LOVENOX) injection 30 mg Start: 03/23/22 1000    Code Status: Full Code Family Communication: No family currently at bedside  Disposition Plan:  Level of care: Progressive Status is: Observation The patient remains OBS appropriate and will d/c before 2 midnights.    Consultants:  None but will call cardiology in the morning  Procedures:  ECHOCARDIOGRAM IMPRESSIONS     1. Left ventricular ejection fraction, by estimation, is 40 to 45%. The  left ventricle has mildly decreased function. The left ventricle  demonstrates global hypokinesis. There is severe concentric left  ventricular hypertrophy. Left ventricular diastolic   parameters are consistent with Grade II diastolic dysfunction  (pseudonormalization). Elevated left atrial pressure. Given degree of LVH,  there is concern for possible HCM vs amyloidosis. Consider CMR for further  work-up if clinically indicated.   2. Right ventricular systolic function is low normal. The right  ventricular size is normal. Mildly increased right ventricular wall  thickness. Tricuspid regurgitation signal is inadequate for assessing PA  pressure.   3. Left atrial size was moderately dilated.   4. Right atrial size was moderately dilated.   5. The mitral valve is grossly normal. Trivial mitral valve  regurgitation. No evidence of mitral stenosis.   6. The aortic valve is tricuspid. There is moderate  calcification of the  aortic valve. There is severe thickening of the aortic valve. Aortic valve  regurgitation is not visualized. Aortic valve sclerosis/calcification is  present, without any evidence of   aortic stenosis.   7. The inferior vena cava is normal in size with greater than 50%  respiratory variability, suggesting right atrial pressure of 3 mmHg.   Comparison(s): Compared to prior TTE in 04/2020, the LVEF has dropped to  40-45% (previously 50-55%). There is severe LVH. Consider CMR for further  work-up for possible HCM vs amyloidosis if clinically indicated.   FINDINGS   Left Ventricle: Left ventricular ejection fraction, by estimation, is 40  to 45%. The left ventricle has mildly decreased function. The left  ventricle demonstrates global hypokinesis. The left ventricular internal  cavity size was normal in size. There is   severe concentric left ventricular hypertrophy. Left ventricular  diastolic parameters are consistent with Grade II diastolic dysfunction  (pseudonormalization). Elevated left atrial pressure.   Right Ventricle: The right ventricular size is normal. Mildly increased  right ventricular wall thickness. Right ventricular systolic function is  low normal. Tricuspid regurgitation signal is inadequate for assessing PA  pressure.   Left Atrium: Left atrial size was moderately dilated.   Right Atrium: Right atrial size was moderately dilated.   Pericardium: There is no evidence of pericardial effusion.   Mitral Valve: The mitral valve is grossly normal.  Trivial mitral valve  regurgitation. No evidence of mitral valve stenosis.   Tricuspid Valve: The tricuspid valve is normal in structure. Tricuspid  valve regurgitation is trivial.   Aortic Valve: The aortic valve is tricuspid. There is moderate  calcification of the aortic valve. There is severe thickening of the  aortic valve. Aortic valve regurgitation is not visualized. Aortic valve   sclerosis/calcification is present, without any  evidence of aortic stenosis. Aortic valve peak gradient measures 3.5 mmHg.   Pulmonic Valve: The pulmonic valve was normal in structure. Pulmonic valve  regurgitation is trivial.   Aorta: The aortic root is normal in size and structure.   Venous: The inferior vena cava is normal in size with greater than 50%  respiratory variability, suggesting right atrial pressure of 3 mmHg.   IAS/Shunts: The atrial septum is grossly normal.      LEFT VENTRICLE  PLAX 2D  LVIDd:         3.50 cm   Diastology  LVIDs:         2.40 cm   LV e' medial:    3.66 cm/s  LV PW:         1.30 cm   LV E/e' medial:  20.3  LV IVS:        1.30 cm   LV e' lateral:   4.11 cm/s  LVOT diam:     2.00 cm   LV E/e' lateral: 18.1  LV SV:         41  LV SV Index:   27  LVOT Area:     3.14 cm      RIGHT VENTRICLE             IVC  RV Basal diam:  2.60 cm     IVC diam: 1.20 cm  RV S prime:     15.20 cm/s  TAPSE (M-mode): 1.9 cm   LEFT ATRIUM             Index        RIGHT ATRIUM           Index  LA diam:        2.40 cm 1.56 cm/m   RA Area:     17.60 cm  LA Vol (A2C):   56.5 ml 36.68 ml/m  RA Volume:   47.95 ml  31.13 ml/m  LA Vol (A4C):   37.4 ml 24.28 ml/m  LA Biplane Vol: 50.6 ml 32.85 ml/m   AORTIC VALVE                 PULMONIC VALVE  AV Area (Vmax): 2.36 cm     PV Vmax:       1.01 m/s  AV Vmax:        93.85 cm/s   PV Peak grad:  4.1 mmHg  AV Peak Grad:   3.5 mmHg  LVOT Vmax:      70.50 cm/s  LVOT Vmean:     46.400 cm/s  LVOT VTI:       0.130 m     AORTA  Ao Root diam: 3.50 cm   MITRAL VALVE  MV Area (PHT): 4.65 cm    SHUNTS  MV Decel Time: 163 msec    Systemic VTI:  0.13 m  MV E velocity: 74.40 cm/s  Systemic Diam: 2.00 cm  MV A velocity: 70.60 cm/s  MV E/A ratio:  1.05   Antimicrobials:  Anti-infectives (From admission, onward)    None  Subjective: Seen and examined at bedside and the patient was doing okay but was complaining of  bilateral foot pain.  States that he does not have any shortness of breath or chest pain.  Feels okay.  Denies any lightheadedness or dizziness.  No other concerns or wants the time  Objective: Vitals:   03/23/22 1500 03/23/22 1636 03/23/22 1703 03/23/22 1921  BP: 112/61 135/88  115/68  Pulse: (!) 41 84  62  Resp: 16 16  20   Temp:  98 F (36.7 C)  98.1 F (36.7 C)  TempSrc:  Oral  Oral  SpO2: 100% 100%  100%  Weight:   52.2 kg   Height:   5\' 7"  (1.702 m)    No intake or output data in the 24 hours ending 03/23/22 1952 Filed Weights   03/23/22 1703  Weight: 52.2 kg   Examination: Physical Exam:  Constitutional: Frail cachectic elderly African-American male in no acute distress Respiratory: Diminished to auscultation bilaterally, no wheezing, rales, rhonchi or crackles. Normal respiratory effort and patient is not tachypenic. No accessory muscle use.  Unlabored breathing Cardiovascular: RRR, no murmurs / rubs / gallops. S1 and S2 auscultated. No extremity edema.  Abdomen: Soft, non-tender, non-distended. Bowel sounds positive.  GU: Deferred. Musculoskeletal: No clubbing / cyanosis of digits/nails. No joint deformity upper and lower extremities.  Skin: No rashes, lesions, ulcers on limited skin evaluation. No induration; Warm and dry.  Neurologic: CN 2-12 grossly intact with no focal deficits. Romberg sign and cerebellar reflexes not assessed.  Psychiatric: Normal judgment and insight. Alert and oriented x 3. Normal mood and appropriate affect.   Data Reviewed: I have personally reviewed following labs and imaging studies  CBC: Recent Labs  Lab 03/21/22 2127 03/22/22 2020 03/23/22 0930  WBC 7.2 7.6 7.0  NEUTROABS 4.2 4.9 4.1  HGB 11.8* 13.3 11.7*  HCT 33.0* 38.7* 34.7*  MCV 94.8 96.3 97.2  PLT 146* 150 [id number]*   Basic Metabolic Panel: Recent Labs  Lab 03/21/22 2127 03/22/22 2020 03/23/22 0930  NA 142 139 138  K 5.2* 5.0 4.9  CL 115* 112* 111  CO2 19* 21* 20*   GLUCOSE 146* 133* 153*  BUN 50* 48* 49*  CREATININE 2.08* 1.98* 2.00*  CALCIUM 9.2 9.3 9.0  MG  --   --  1.8  PHOS  --   --  4.0   GFR: Estimated Creatinine Clearance: 20.3 mL/min (A) (by C-G formula based on SCr of 2 mg/dL (H)). Liver Function Tests: Recent Labs  Lab 03/21/22 2127 03/22/22 2020 03/23/22 0930  AST 18 18 19   ALT 22 21 19   ALKPHOS 77 82 71  BILITOT 0.6 0.8 0.9  PROT 6.5 7.6 6.6  ALBUMIN 3.8 4.3 3.8   Recent Labs  Lab 03/21/22 2127  LIPASE 35   No results for input(s): "AMMONIA" in the last 168 hours. Coagulation Profile: No results for input(s): "INR", "PROTIME" in the last 168 hours. Cardiac Enzymes: No results for input(s): "CKTOTAL", "CKMB", "CKMBINDEX", "TROPONINI" in the last 168 hours. BNP (last 3 results) No results for input(s): "PROBNP" in the last 8760 hours. HbA1C: No results for input(s): "HGBA1C" in the last 72 hours. CBG: No results for input(s): "GLUCAP" in the last 168 hours. Lipid Profile: No results for input(s): "CHOL", "HDL", "LDLCALC", "TRIG", "CHOLHDL", "LDLDIRECT" in the last 72 hours. Thyroid Function Tests: No results for input(s): "TSH", "T4TOTAL", "FREET4", "T3FREE", "THYROIDAB" in the last 72 hours. Anemia Panel: No results for input(s): "VITAMINB12", "FOLATE", "FERRITIN", "TIBC", "IRON", "  RETICCTPCT" in the last 72 hours. Sepsis Labs: Recent Labs  Lab 03/22/22 2020  LATICACIDVEN 1.4    Recent Results (from the past 240 hour(s))  Urine Culture     Status: None   Collection Time: 03/22/22  9:58 PM   Specimen: Urine, Clean Catch  Result Value Ref Range Status   Specimen Description   Final    URINE, CLEAN CATCH Performed at [facility], [place] [address] [place], [place] [address]    Special Requests   Final    NONE Performed at [facility], [place] [address], [place], [place] [address]    Culture   Final    NO GROWTH Performed at [facility], [place] [address]  [place], [place] [address]    Report Status 03/23/2022 FINAL  Final     Radiology Studies: DG Ankle Complete Left  Result Date: 03/23/2022 CLINICAL DATA:  Pain EXAM: LEFT ANKLE COMPLETE - 3+ VIEW COMPARISON:  None Available. FINDINGS: No fracture or dislocation is seen. There are no focal lytic lesions. Degenerative changes with bony spurs are noted in medial malleolus. Small bony spurs are noted in the intertarsal joints. Vascular calcifications are seen in soft tissues. IMPRESSION: No fracture or dislocation is seen. Degenerative changes are noted with bony spurs in medial malleolus and intertarsal joints. Electronically Signed   By: Elmer Picker M.D.   On: 03/23/2022 13:04   DG Ankle Complete Right  Result Date: 03/23/2022 CLINICAL DATA:  Pain EXAM: RIGHT ANKLE - COMPLETE 3+ VIEW COMPARISON:  None Available. FINDINGS: No fracture or dislocation is seen. Bony spurs are noted at the tip of medial malleolus. Degenerative changes are noted in talonavicular joint. There is cortical protuberance, possibly osteochondroma in the dorsal aspect of anterior talus without break in the cortical margins. This may suggest osteochondroma. Vascular calcifications are seen in soft tissues. IMPRESSION: No recent fracture is seen. Degenerative changes are noted with bony spurs in medial malleolus and dorsal aspect of talonavicular joint. Electronically Signed   By: Elmer Picker M.D.   On: 03/23/2022 13:03   DG Foot Complete Left  Result Date: 03/23/2022 CLINICAL DATA:  Pain EXAM: LEFT FOOT - COMPLETE 3+ VIEW COMPARISON:  None Available. FINDINGS: No fracture or dislocation is seen. There are no focal lytic lesions. Degenerative changes with bony spurs are noted in first metatarsophalangeal joint. Vascular calcifications are seen in soft tissues. IMPRESSION: No fracture or dislocation is seen. Degenerative changes are noted in first metatarsophalangeal joint. Electronically Signed   By: Elmer Picker  M.D.   On: 03/23/2022 13:01   DG Foot Complete Right  Result Date: 03/23/2022 CLINICAL DATA:  Pain EXAM: RIGHT FOOT COMPLETE - 3+ VIEW COMPARISON:  None Available. FINDINGS: No fracture or dislocation is seen in right foot. Hallux valgus deformity is noted. Degenerative changes are noted in first metatarsophalangeal joint with bony spurs. There is cortical protuberance in the dorsal aspect of anterior talus. There are scattered vascular calcifications in the soft tissues. IMPRESSION: No recent fracture or dislocation is seen. Degenerative changes are noted in first metatarsophalangeal joint and talonavicular joint. Electronically Signed   By: Elmer Picker M.D.   On: 03/23/2022 12:59   ECHOCARDIOGRAM COMPLETE  Result Date: 03/23/2022    ECHOCARDIOGRAM REPORT   Patient Name:   Vincent Baxter Date of Exam: 03/23/2022 Medical Rec #:  [id number]      Height:       65.0 in Accession #:    [id number]  Weight:       111.0 lb Date of Birth:  04-Aug-1934     BSA:          1.540 m Patient Age:    51 years       BP:           123/70 mmHg Patient Gender: M              HR:           81 bpm. Exam Location:  Inpatient Procedure: 2D Echo, Cardiac Doppler and Color Doppler Indications:    Abnormal ECG  History:        Patient has prior history of Echocardiogram examinations, most                 recent 05/02/2020. Risk Factors:Hypertension, Diabetes and                 Dyslipidemia.  Sonographer:    Jefferey Pica Referring Phys: [id number] [place]  1. Left ventricular ejection fraction, by estimation, is 40 to 45%. The left ventricle has mildly decreased function. The left ventricle demonstrates global hypokinesis. There is severe concentric left ventricular hypertrophy. Left ventricular diastolic  parameters are consistent with Grade II diastolic dysfunction (pseudonormalization). Elevated left atrial pressure. Given degree of LVH, there is concern for possible HCM vs amyloidosis. Consider CMR for  further work-up if clinically indicated.  2. Right ventricular systolic function is low normal. The right ventricular size is normal. Mildly increased right ventricular wall thickness. Tricuspid regurgitation signal is inadequate for assessing PA pressure.  3. Left atrial size was moderately dilated.  4. Right atrial size was moderately dilated.  5. The mitral valve is grossly normal. Trivial mitral valve regurgitation. No evidence of mitral stenosis.  6. The aortic valve is tricuspid. There is moderate calcification of the aortic valve. There is severe thickening of the aortic valve. Aortic valve regurgitation is not visualized. Aortic valve sclerosis/calcification is present, without any evidence of  aortic stenosis.  7. The inferior vena cava is normal in size with greater than 50% respiratory variability, suggesting right atrial pressure of 3 mmHg. Comparison(s): Compared to prior TTE in 04/2020, the LVEF has dropped to 40-45% (previously 50-55%). There is severe LVH. Consider CMR for further work-up for possible HCM vs amyloidosis if clinically indicated. FINDINGS  Left Ventricle: Left ventricular ejection fraction, by estimation, is 40 to 45%. The left ventricle has mildly decreased function. The left ventricle demonstrates global hypokinesis. The left ventricular internal cavity size was normal in size. There is  severe concentric left ventricular hypertrophy. Left ventricular diastolic parameters are consistent with Grade II diastolic dysfunction (pseudonormalization). Elevated left atrial pressure. Right Ventricle: The right ventricular size is normal. Mildly increased right ventricular wall thickness. Right ventricular systolic function is low normal. Tricuspid regurgitation signal is inadequate for assessing PA pressure. Left Atrium: Left atrial size was moderately dilated. Right Atrium: Right atrial size was moderately dilated. Pericardium: There is no evidence of pericardial effusion. Mitral Valve: The  mitral valve is grossly normal. Trivial mitral valve regurgitation. No evidence of mitral valve stenosis. Tricuspid Valve: The tricuspid valve is normal in structure. Tricuspid valve regurgitation is trivial. Aortic Valve: The aortic valve is tricuspid. There is moderate calcification of the aortic valve. There is severe thickening of the aortic valve. Aortic valve regurgitation is not visualized. Aortic valve sclerosis/calcification is present, without any evidence of aortic stenosis. Aortic valve peak gradient measures 3.5 mmHg. Pulmonic Valve: The  pulmonic valve was normal in structure. Pulmonic valve regurgitation is trivial. Aorta: The aortic root is normal in size and structure. Venous: The inferior vena cava is normal in size with greater than 50% respiratory variability, suggesting right atrial pressure of 3 mmHg. IAS/Shunts: The atrial septum is grossly normal.  LEFT VENTRICLE PLAX 2D LVIDd:         3.50 cm   Diastology LVIDs:         2.40 cm   LV e' medial:    3.66 cm/s LV PW:         1.30 cm   LV E/e' medial:  20.3 LV IVS:        1.30 cm   LV e' lateral:   4.11 cm/s LVOT diam:     2.00 cm   LV E/e' lateral: 18.1 LV SV:         41 LV SV Index:   27 LVOT Area:     3.14 cm  RIGHT VENTRICLE             IVC RV Basal diam:  2.60 cm     IVC diam: 1.20 cm RV S prime:     15.20 cm/s TAPSE (M-mode): 1.9 cm LEFT ATRIUM             Index        RIGHT ATRIUM           Index LA diam:        2.40 cm 1.56 cm/m   RA Area:     17.60 cm LA Vol (A2C):   56.5 ml 36.68 ml/m  RA Volume:   47.95 ml  31.13 ml/m LA Vol (A4C):   37.4 ml 24.28 ml/m LA Biplane Vol: 50.6 ml 32.85 ml/m  AORTIC VALVE                 PULMONIC VALVE AV Area (Vmax): 2.36 cm     PV Vmax:       1.01 m/s AV Vmax:        93.85 cm/s   PV Peak grad:  4.1 mmHg AV Peak Grad:   3.5 mmHg LVOT Vmax:      70.50 cm/s LVOT Vmean:     46.400 cm/s LVOT VTI:       0.130 m  AORTA Ao Root diam: 3.50 cm MITRAL VALVE MV Area (PHT): 4.65 cm    SHUNTS MV Decel Time: 163  msec    Systemic VTI:  0.13 m MV E velocity: 74.40 cm/s  Systemic Diam: 2.00 cm MV A velocity: 70.60 cm/s MV E/A ratio:  1.05 Gwyndolyn Kaufman MD Electronically signed by Gwyndolyn Kaufman MD Signature Date/Time: 03/23/2022/12:58:13 PM    Final    DG Ribs Unilateral Right  Result Date: 03/23/2022 CLINICAL DATA:  Palpable abnormality in the right posterior ribcage, initial encounter EXAM: RIGHT RIBS - 2 VIEW COMPARISON:  None Available. FINDINGS: Cardiac shadow is within normal limits. Aortic calcifications are noted. The lungs are well aerated bilaterally. No acute rib fracture is seen. No pneumothorax is noted. IMPRESSION: No acute rib abnormality noted. No findings to correspond with the given clinical history. Electronically Signed   By: Inez Catalina M.D.   On: 03/23/2022 03:05   MR LUMBAR SPINE WO CONTRAST  Result Date: 03/23/2022 CLINICAL DATA:  Initial evaluation for low back pain, cauda equina syndrome suspected. EXAM: MRI LUMBAR SPINE WITHOUT CONTRAST TECHNIQUE: Multiplanar, multisequence MR imaging of the lumbar spine was performed. No intravenous contrast was  administered. COMPARISON:  Prior radiograph from earlier the same day. FINDINGS: Segmentation: Standard. Lowest well-formed disc space labeled the L5-S1 level. Alignment: 2 mm anterolisthesis of L4 on L5, chronic and facet mediated. Alignment otherwise normal preservation of the normal lumbar lordosis. Vertebrae: Vertebral body height maintained without acute or chronic fracture. Bone marrow signal intensity diffusely heterogeneous without worrisome osseous lesion. Reactive marrow edema present about the bilateral L3-4 and L4-5 facets, presumably due to acute facet arthritis. No other abnormal marrow edema. Conus medullaris and cauda equina: Conus extends to the L1 level. Conus and cauda equina appear normal. Paraspinal and other soft tissues: Mild diffuse edema noted within the lower posterior paraspinous soft tissues, likely reactive in  nature due to adjacent facet arthritis. No collections. Asymmetric right renal atrophy. There is question of a heterogeneous partially exophytic mass extending from the posterior right kidney measuring approximately 1.7 cm (series 8, image 9). Few additional benign-appearing cysts noted. Intra-abdominal aneurysm measures up to 4.1 cm in diameter. 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 L1-2: Disc desiccation without significant disc bulge. Mild facet and ligament flavum hypertrophy. No significant spinal stenosis. Foramina remain patent. L2-3: Disc desiccation without significant disc bulge. Mild facet and ligament flavum hypertrophy. No significant spinal stenosis. Foramina remain patent. L3-4: Disc desiccation with minimal annular disc bulge. Mild to moderate facet and ligament flavum hypertrophy. Underlying short pedicles. Resultant mild bilateral subarticular stenosis. Central canal remains patent. Mild bilateral L3 foraminal narrowing. L4-5: Trace anterolisthesis. Disc desiccation with diffuse disc bulge. Mild reactive endplate spurring. Moderate to advanced facet and ligament flavum hypertrophy. Associated trace joint effusions and reactive marrow edema. Resultant severe spinal stenosis with near complete effacement of the thecal sac (series 8, image 28). Mild bilateral L4 foraminal narrowing. L5-S1: Minimal annular disc bulge. Mild facet hypertrophy. No significant spinal stenosis. Foramina remain patent. IMPRESSION: 1. 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 2. 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 Finding could serve as a source for lower back pain. 3. Additional mild for age degenerative changes elsewhere within the lumbar spine without significant stenosis or neural impingement. 4. Question 1.7 cm heterogeneous exophytic  mass extending from the posterior right kidney, indeterminate. This is not well seen on prior noncontrast CT of the abdomen and pelvis performed earlier the same day. Further assessment with dedicated renal mass protocol CT and/or MRI recommended for further characterization. 5. 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 Recommend follow-up every 12 months and vascular consultation. this recommendation follows ACR consensus guidelines: White Paper of the ACR Incidental Findings Committee II on Vascular Findings. J Am Coll Radiol 2013; 10:789-794. Electronically Signed   By: Jeannine Boga M.D.   On: 03/23/2022 00:03   CT ABDOMEN PELVIS WO CONTRAST  Result Date: 03/22/2022 CLINICAL DATA:  Right-sided flank pain, initial encounter EXAM: CT ABDOMEN AND PELVIS WITHOUT CONTRAST TECHNIQUE: Multidetector CT imaging of the abdomen and pelvis was performed following the standard protocol without IV contrast. RADIATION DOSE REDUCTION: This exam was performed according to the departmental dose-optimization program which includes automated exposure control, adjustment of the mA and/or kV according to patient size and/or use of iterative reconstruction technique. COMPARISON:  05/01/2020 FINDINGS: Lower chest: Stable scarring is noted in the lower lobes bilaterally. Hepatobiliary: No focal liver abnormality is seen. No gallstones, gallbladder wall thickening, or biliary dilatation. Pancreas: Unremarkable. No pancreatic ductal dilatation or surrounding inflammatory changes. Spleen:  Normal in size without focal abnormality. Adrenals/Urinary Tract: Adrenal glands are within normal limits. Kidneys show no obstructive changes. The right kidney is atrophic with multiple lower pole renal stones stable in appearance from the prior exam. Small right renal cyst is noted stable from the prior exam. No follow-up is recommended. The bladder is partially distended. Stomach/Bowel: No obstructive or inflammatory changes of the colon are noted.  The appendix is not visualized. No inflammatory changes to suggest appendicitis are noted. Fluid is noted within the small bowel although no obstructive changes are seen. The stomach is decompressed. Vascular/Lymphatic: Persistent 3.9 cm infrarenal aortic aneurysm stable from the prior study. No adenopathy is noted. Reproductive: Prostate is unremarkable. Other: No abdominal wall hernia or abnormality. No abdominopelvic ascites. Musculoskeletal: No acute or significant osseous findings. IMPRESSION: No acute abnormality is identified. Stable 3.9 cm infrarenal abdominal aortic aneurysm. Recommend follow-up every 2 years. Reference: J Am Coll Radiol [id number]. Electronically Signed   By: Inez Catalina M.D.   On: 03/22/2022 22:43   DG Lumbar Spine Complete  Result Date: 03/22/2022 CLINICAL DATA:  Lower back pain. EXAM: LUMBAR SPINE - COMPLETE 4+ VIEW COMPARISON:  May 03, 2020. FINDINGS: There is no evidence of lumbar spine fracture. Alignment is normal. Intervertebral disc spaces are maintained. IMPRESSION: Negative. Electronically Signed   By: Marijo Conception M.D.   On: 03/22/2022 16:22   DG HIP UNILAT WITH PELVIS 2-3 VIEWS RIGHT  Result Date: 03/22/2022 CLINICAL DATA:  Fall. EXAM: DG HIP (WITH OR WITHOUT PELVIS) 2-3V RIGHT COMPARISON:  None Available. FINDINGS: There is no evidence of hip fracture or dislocation. There is no evidence of arthropathy or other focal bone abnormality. IMPRESSION: Negative. Electronically Signed   By: Marijo Conception M.D.   On: 03/22/2022 16:21     Scheduled Meds:  enoxaparin (LOVENOX) injection  30 mg Subcutaneous Q24H   Continuous Infusions:   LOS: 0 days   Raiford Noble, DO [facility] Available via Epic secure chat 7am-7pm After these hours, please refer to coverage provider listed on [email or web address] 03/23/2022, 7:52 PM

## 2022-03-23 NOTE — Assessment & Plan Note (Signed)
HR into 30s but frequent bigeminy seen on EKG could account for this. However troponin is elevated. Continue to trend -obtain echocardiogram -keep on continuous telemetry

## 2022-03-23 NOTE — Assessment & Plan Note (Signed)
Questionable 1.7 cm heterogeneous exophytic mass extending from the posterior right kidney, indeterminate.  Further assessment with dedicated renal mass protocol CT and/or MRI recommended for further characterization.

## 2022-03-23 NOTE — ED Notes (Signed)
Pt given coke to drink and additional warm blanket

## 2022-03-23 NOTE — Assessment & Plan Note (Addendum)
Controlled. Not on antihypertensives

## 2022-03-24 ENCOUNTER — Encounter (HOSPITAL_COMMUNITY): Payer: Self-pay | Admitting: Family Medicine

## 2022-03-24 DIAGNOSIS — I517 Cardiomegaly: Secondary | ICD-10-CM

## 2022-03-24 DIAGNOSIS — M79672 Pain in left foot: Secondary | ICD-10-CM

## 2022-03-24 DIAGNOSIS — J439 Emphysema, unspecified: Secondary | ICD-10-CM | POA: Diagnosis not present

## 2022-03-24 DIAGNOSIS — E1122 Type 2 diabetes mellitus with diabetic chronic kidney disease: Secondary | ICD-10-CM | POA: Diagnosis not present

## 2022-03-24 DIAGNOSIS — I7143 Infrarenal abdominal aortic aneurysm, without rupture: Secondary | ICD-10-CM | POA: Diagnosis not present

## 2022-03-24 DIAGNOSIS — I10 Essential (primary) hypertension: Secondary | ICD-10-CM | POA: Diagnosis not present

## 2022-03-24 DIAGNOSIS — R001 Bradycardia, unspecified: Secondary | ICD-10-CM | POA: Diagnosis not present

## 2022-03-24 DIAGNOSIS — M79671 Pain in right foot: Secondary | ICD-10-CM

## 2022-03-24 LAB — CBC WITH DIFFERENTIAL/PLATELET
Abs Immature Granulocytes: 0.02 10*3/uL (ref 0.00–0.07)
Basophils Absolute: 0 10*3/uL (ref 0.0–0.1)
Basophils Relative: 1 %
Eosinophils Absolute: 0.1 10*3/uL (ref 0.0–0.5)
Eosinophils Relative: 2 %
HCT: 29.8 % — ABNORMAL LOW (ref 39.0–52.0)
Hemoglobin: 10.7 g/dL — ABNORMAL LOW (ref 13.0–17.0)
Immature Granulocytes: 0 %
Lymphocytes Relative: 32 %
Lymphs Abs: 2.1 10*3/uL (ref 0.7–4.0)
MCH: 33.9 pg (ref 26.0–34.0)
MCHC: 35.9 g/dL (ref 30.0–36.0)
MCV: 94.3 fL (ref 80.0–100.0)
Monocytes Absolute: 0.6 10*3/uL (ref 0.1–1.0)
Monocytes Relative: 9 %
Neutro Abs: 3.6 10*3/uL (ref 1.7–7.7)
Neutrophils Relative %: 56 %
Platelets: 137 10*3/uL — ABNORMAL LOW (ref 150–400)
RBC: 3.16 MIL/uL — ABNORMAL LOW (ref 4.22–5.81)
RDW: 13.2 % (ref 11.5–15.5)
WBC: 6.4 10*3/uL (ref 4.0–10.5)
nRBC: 0 % (ref 0.0–0.2)

## 2022-03-24 LAB — COMPREHENSIVE METABOLIC PANEL
ALT: 14 U/L (ref 0–44)
AST: 14 U/L — ABNORMAL LOW (ref 15–41)
Albumin: 3.4 g/dL — ABNORMAL LOW (ref 3.5–5.0)
Alkaline Phosphatase: 74 U/L (ref 38–126)
Anion gap: 6 (ref 5–15)
BUN: 50 mg/dL — ABNORMAL HIGH (ref 8–23)
CO2: 18 mmol/L — ABNORMAL LOW (ref 22–32)
Calcium: 8.4 mg/dL — ABNORMAL LOW (ref 8.9–10.3)
Chloride: 112 mmol/L — ABNORMAL HIGH (ref 98–111)
Creatinine, Ser: 1.74 mg/dL — ABNORMAL HIGH (ref 0.61–1.24)
GFR, Estimated: 38 mL/min — ABNORMAL LOW (ref >60)
Glucose, Bld: 128 mg/dL — ABNORMAL HIGH (ref 70–99)
Potassium: 4.6 mmol/L (ref 3.5–5.1)
Sodium: 136 mmol/L (ref 135–145)
Total Bilirubin: 0.5 mg/dL (ref 0.3–1.2)
Total Protein: 5.8 g/dL — ABNORMAL LOW (ref 6.5–8.1)

## 2022-03-24 LAB — MAGNESIUM: Magnesium: 1.8 mg/dL (ref 1.7–2.4)

## 2022-03-24 LAB — PHOSPHORUS: Phosphorus: 3.4 mg/dL (ref 2.5–4.6)

## 2022-03-24 NOTE — Progress Notes (Signed)
RN was alerted by EVS about cigarettes and ashes found in bathroom trash can. This RN went to room to speak with pt. Cigarette carton and lighter seen on bedside table. Pt educated on importance of not doing that while in hospital due to the oxygen in each patient room. Pt showed RN empty pack of cigarettes and said "I do not have anymore anyways. I smoked the last one last night". RN asked to place lighter in belonging bag in cabinet and pt complied. Pt verbalized understanding of why not to smoke in hospital. MD made aware and pt refused nicotine patch and gum.

## 2022-03-24 NOTE — Consult Note (Signed)
Cardiology Consultation:   Patient ID: Vincent Baxter; [id number]; 04-29-1935   Admit date: 03/22/2022 Date of Consult: 03/24/2022  Primary Care Provider: Patient, No Pcp Per Primary Cardiologist: None  Primary Electrophysiologist:  None   Patient Profile:   Vincent Baxter is a 87 y.o. male with a hx of diastolic heart failure who is being seen today for the evaluation of bradycardia, reduced ejection fraction and left ventricular hypertrophy at the request of Dr. Alfredia Ferguson.  History of Present Illness:   [name] presented for evaluation of rib pain.  He has yet does not have a clear etiology.  There is a heterogeneous mass in the right kidney that is being evaluated..  In the ED he was found to have bradycardia.  Trop was mildly elevated.  We are consulted for this and because he has an ejection fraction is mildly reduced at 40 to 45%.  There is severe left ventricular hypertrophy.   He was seen in 2021 by another cardiology group.  He had uncontrolled hypertension and some evidence of volume overload.  His ejection fraction was relatively well controlled.  It does not look like he required outpatient management.  No further work-up was suggested.  He actually did not present with cardiac issues.  His issue has been some rib pain and he also talks about pain from the bottom of his foot up to his hip.  He says this gives him difficulty walking.  He lives by himself.  He actually walks behind the mower and would be even more active if it was not for his leg pain.  He does not have any shortness of breath, PND or orthopnea.  He does not notice palpitations, presyncope or syncope.  He is exceptionally thin and thinks maybe he is lost a little weight but he is always been thin.  Past Medical History:  Diagnosis Date   Diabetes mellitus 01/2009 dx   DYSLIPIDEMIA    GERD    Hypertension    Kidney stones    RENAL CALCULUS 01/04/2009    Past Surgical History:  Procedure Laterality Date    NO PAST SURGERIES       Home Medications:  Prior to Admission medications   Medication Sig Start Date End Date Taking? Authorizing Provider  aspirin EC 81 MG EC tablet Take 1 tablet (81 mg total) by mouth daily. Swallow whole. Patient not taking: Reported on 10/31/2021 05/05/20   Arrien, Jimmy Picket, MD  atorvastatin (LIPITOR) 20 MG tablet Take 1 tablet (20 mg total) by mouth at bedtime. Patient not taking: Reported on 10/31/2021 05/04/20 06/03/20  Arrien, Jimmy Picket, MD  doxycycline (VIBRAMYCIN) 100 MG capsule Take 1 capsule (100 mg total) by mouth 2 (two) times daily. Patient not taking: Reported on 03/22/2022 10/31/21   Horton, Barbette Hair, MD  furosemide (LASIX) 20 MG tablet Take 1 tablet (20 mg total) by mouth daily as needed for up to 20 days for fluid or edema (Take as needed for lower extremity swelling or shortness of breath.). Patient not taking: Reported on 10/31/2021 05/04/20 05/24/20  Arrien, Jimmy Picket, MD  metoprolol tartrate (LOPRESSOR) 25 MG tablet Take 1 tablet (25 mg total) by mouth 2 (two) times daily. Patient not taking: Reported on 10/31/2021 05/04/20 06/03/20  Arrien, Jimmy Picket, MD    Inpatient Medications: Scheduled Meds:  enoxaparin (LOVENOX) injection  30 mg Subcutaneous Q24H   Continuous Infusions:  PRN Meds: acetaminophen, mouth rinse  Allergies:   No Known Allergies  Social  History:   Social History   Socioeconomic History   Marital status: Widowed    Spouse name: Not on file   Number of children: Not on file   Years of education: Not on file   Highest education level: Not on file  Occupational History   Not on file  Tobacco Use   Smoking status: Every Day    Packs/day: 0.50    Types: Cigarettes   Smokeless tobacco: Never   Tobacco comments:    Retired Therapist, art, Lives alone, 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  Scientific laboratory technician Use: Never used  Substance and Sexual Activity   Alcohol use: No   Drug use: No   Sexual activity: Not on file   Other Topics Concern   Not on file  Social History Narrative   Not on file   Social Determinants of Health   Financial Resource Strain: Not on file  Food Insecurity: No Food Insecurity (03/23/2022)   Hunger Vital Sign    Worried About Running Out of Food in the Last Year: Never true    Ran Out of Food in the Last Year: Never true  Transportation Needs: No Transportation Needs (03/23/2022)   PRAPARE - Hydrologist (Medical): No    Lack of Transportation (Non-Medical): No  Physical Activity: Not on file  Stress: Not on file  Social Connections: Not on file  Intimate Partner Violence: Not At Risk (03/23/2022)   Humiliation, Afraid, Rape, and Kick questionnaire    Fear of Current or Ex-Partner: No    Emotionally Abused: No    Physically Abused: No    Sexually Abused: No    Family History:    Family History  Problem Relation Age of Onset   Hypertension Mother      ROS:  Please see the history of present illness.   All other ROS reviewed and negative.     Physical Exam/Data:   Vitals:   03/23/22 1703 03/23/22 1921 03/24/22 0155 03/24/22 0449  BP:  115/68 115/75 (!) 89/54  Pulse:  62 61 85  Resp:  _0 Temp:  98.1 F (36.7 C) 98 F (36.7 C) 97.8 F (36.6 C)  TempSrc:  Oral Oral Oral  SpO2:  100% 99% 98%  Weight: 52.2 kg     Height: _1  (1.702 m)      No intake or output data in the 24 hours ending 03/24/22 0828 Filed Weights   03/23/22 1703  Weight: 52.2 kg   Body mass index is 18.01 kg/m.  GENERAL:  Well appearing but very thin HEENT:   Pupils equal round and reactive, fundi not visualized, oral mucosa unremarkable NECK:  No  jugular venous distention, waveform within normal limits, carotid upstroke brisk and symmetric, no bruits, no thyromegaly LYMPHATICS:  No cervical, inguinal adenopathy LUNGS:   Clear to auscultation bilaterally BACK:  No CVA tenderness CHEST:   Unremarkable HEART:  PMI not displaced or sustained,S1  and S2 within normal limits, no S3, no S4, no clicks, no rubs, no murmurs ABD:  Flat, positive bowel sounds normal in frequency in pitch, no bruits, no rebound, no guarding, no midline pulsatile mass, no hepatomegaly, no splenomegaly EXT:  2 plus pulses upper pulses and diminished dorsalis pedis and posterior tibialis bilaterally, no  edema, no cyanosis no clubbing SKIN:  No rashes no nodules NEURO:   Cranial nerves II through XII grossly intact, motor grossly intact throughout PSYCH:    Cognitively intact,  oriented to person place and time   EKG:  The EKG was personally reviewed and demonstrates: Normal sinus rhythm, rate 82, frequent ventricular ectopy.  Baseline artifact precludes adequate analysis.  Telemetry:  Telemetry was personally reviewed and demonstrates:  NSR frequent ventricular ectopy with bigeminy, couplets and short runs of nonsustained ventricular tachycardia  Relevant CV Studies: See below  Laboratory Data:  Chemistry Recent Labs  Lab 03/22/22 2020 03/23/22 0930 03/24/22 0523  NA 139 138 136  K 5.0 4.9 4.6  CL 112* 111 112*  CO2 21* 20* 18*  GLUCOSE 133* 153* 128*  BUN 48* 49* 50*  CREATININE 1.98* 2.00* 1.74*  CALCIUM 9.3 9.0 8.4*  GFRNONAA 33* 32* 38*  ANIONGAP _0 Recent Labs  Lab 03/22/22 2020 03/23/22 0930 03/24/22 0523  PROT 7.6 6.6 5.8*  ALBUMIN 4.3 3.8 3.4*  AST 18 19 14*  ALT _1 ALKPHOS 82 71 74  BILITOT 0.8 0.9 0.5   Hematology Recent Labs  Lab 03/22/22 2020 03/23/22 0930 03/24/22 0523  WBC 7.6 7.0 6.4  RBC 4.02* 3.57* 3.16*  HGB 13.3 11.7* 10.7*  HCT 38.7* 34.7* 29.8*  MCV 96.3 97.2 94.3  MCH 33.1 32.8 33.9  MCHC 34.4 33.7 35.9  RDW 13.5 13.4 13.2  PLT 150 141* 137*   Cardiac EnzymesNo results for input(s): "TROPONINI" in the last 168 hours. No results for input(s): "TROPIPOC" in the last 168 hours.  BNPNo results for input(s): "BNP", "PROBNP" in the last 168 hours.  DDimer No results for input(s): "DDIMER" in  the last 168 hours.  Radiology/Studies:  DG Ankle Complete Left  Result Date: 03/23/2022 CLINICAL DATA:  Pain EXAM: LEFT ANKLE COMPLETE - 3+ VIEW COMPARISON:  None Available. FINDINGS: No fracture or dislocation is seen. There are no focal lytic lesions. Degenerative changes with bony spurs are noted in medial malleolus. Small bony spurs are noted in the intertarsal joints. Vascular calcifications are seen in soft tissues. IMPRESSION: No fracture or dislocation is seen. Degenerative changes are noted with bony spurs in medial malleolus and intertarsal joints. Electronically Signed   By: Elmer Picker M.D.   On: 03/23/2022 13:04   DG Ankle Complete Right  Result Date: 03/23/2022 CLINICAL DATA:  Pain EXAM: RIGHT ANKLE - COMPLETE 3+ VIEW COMPARISON:  None Available. FINDINGS: No fracture or dislocation is seen. Bony spurs are noted at the tip of medial malleolus. Degenerative changes are noted in talonavicular joint. There is cortical protuberance, possibly osteochondroma in the dorsal aspect of anterior talus without break in the cortical margins. This may suggest osteochondroma. Vascular calcifications are seen in soft tissues. IMPRESSION: No recent fracture is seen. Degenerative changes are noted with bony spurs in medial malleolus and dorsal aspect of talonavicular joint. Electronically Signed   By: Elmer Picker M.D.   On: 03/23/2022 13:03   DG Foot Complete Left  Result Date: 03/23/2022 CLINICAL DATA:  Pain EXAM: LEFT FOOT - COMPLETE 3+ VIEW COMPARISON:  None Available. FINDINGS: No fracture or dislocation is seen. There are no focal lytic lesions. Degenerative changes with bony spurs are noted in first metatarsophalangeal joint. Vascular calcifications are seen in soft tissues. IMPRESSION: No fracture or dislocation is seen. Degenerative changes are noted in first metatarsophalangeal joint. Electronically Signed   By: Elmer Picker M.D.   On: 03/23/2022 13:01   DG Foot Complete  Right  Result Date: 03/23/2022 CLINICAL DATA:  Pain EXAM: RIGHT FOOT COMPLETE - 3+ VIEW COMPARISON:  None Available.  FINDINGS: No fracture or dislocation is seen in right foot. Hallux valgus deformity is noted. Degenerative changes are noted in first metatarsophalangeal joint with bony spurs. There is cortical protuberance in the dorsal aspect of anterior talus. There are scattered vascular calcifications in the soft tissues. IMPRESSION: No recent fracture or dislocation is seen. Degenerative changes are noted in first metatarsophalangeal joint and talonavicular joint. Electronically Signed   By: Elmer Picker M.D.   On: 03/23/2022 12:59   ECHOCARDIOGRAM COMPLETE  Result Date: 03/23/2022    ECHOCARDIOGRAM REPORT   Patient Name:   Tymir CONTRELL BALLENTINE Date of Exam: 03/23/2022 Medical Rec #:  [id number]      Height:       65.0 in Accession #:    [id number]     Weight:       111.0 lb Date of Birth:  [birth year]     BSA:          1.540 m Patient Age:    33 years       BP:           123/70 mmHg Patient Gender: M              HR:           81 bpm. Exam Location:  Inpatient Procedure: 2D Echo, Cardiac Doppler and Color Doppler Indications:    Abnormal ECG  History:        Patient has prior history of Echocardiogram examinations, most                 recent 05/02/2020. Risk Factors:Hypertension, Diabetes and                 Dyslipidemia.  Sonographer:    Jefferey Pica Referring Phys: [id number] [place]  1. Left ventricular ejection fraction, by estimation, is 40 to 45%. The left ventricle has mildly decreased function. The left ventricle demonstrates global hypokinesis. There is severe concentric left ventricular hypertrophy. Left ventricular diastolic  parameters are consistent with Grade II diastolic dysfunction (pseudonormalization). Elevated left atrial pressure. Given degree of LVH, there is concern for possible HCM vs amyloidosis. Consider CMR for further work-up if clinically indicated.  2. Right  ventricular systolic function is low normal. The right ventricular size is normal. Mildly increased right ventricular wall thickness. Tricuspid regurgitation signal is inadequate for assessing PA pressure.  3. Left atrial size was moderately dilated.  4. Right atrial size was moderately dilated.  5. The mitral valve is grossly normal. Trivial mitral valve regurgitation. No evidence of mitral stenosis.  6. The aortic valve is tricuspid. There is moderate calcification of the aortic valve. There is severe thickening of the aortic valve. Aortic valve regurgitation is not visualized. Aortic valve sclerosis/calcification is present, without any evidence of  aortic stenosis.  7. The inferior vena cava is normal in size with greater than 50% respiratory variability, suggesting right atrial pressure of 3 mmHg. Comparison(s): Compared to prior TTE in 04/2020, the LVEF has dropped to 40-45% (previously 50-55%). There is severe LVH. Consider CMR for further work-up for possible HCM vs amyloidosis if clinically indicated. FINDINGS  Left Ventricle: Left ventricular ejection fraction, by estimation, is 40 to 45%. The left ventricle has mildly decreased function. The left ventricle demonstrates global hypokinesis. The left ventricular internal cavity size was normal in size. There is  severe concentric left ventricular hypertrophy. Left ventricular diastolic parameters are consistent with Grade II diastolic dysfunction (pseudonormalization). Elevated left atrial pressure. Right Ventricle: The  right ventricular size is normal. Mildly increased right ventricular wall thickness. Right ventricular systolic function is low normal. Tricuspid regurgitation signal is inadequate for assessing PA pressure. Left Atrium: Left atrial size was moderately dilated. Right Atrium: Right atrial size was moderately dilated. Pericardium: There is no evidence of pericardial effusion. Mitral Valve: The mitral valve is grossly normal. Trivial mitral  valve regurgitation. No evidence of mitral valve stenosis. Tricuspid Valve: The tricuspid valve is normal in structure. Tricuspid valve regurgitation is trivial. Aortic Valve: The aortic valve is tricuspid. There is moderate calcification of the aortic valve. There is severe thickening of the aortic valve. Aortic valve regurgitation is not visualized. Aortic valve sclerosis/calcification is present, without any evidence of aortic stenosis. Aortic valve peak gradient measures 3.5 mmHg. Pulmonic Valve: The pulmonic valve was normal in structure. Pulmonic valve regurgitation is trivial. Aorta: The aortic root is normal in size and structure. Venous: The inferior vena cava is normal in size with greater than 50% respiratory variability, suggesting right atrial pressure of 3 mmHg. IAS/Shunts: The atrial septum is grossly normal.  LEFT VENTRICLE PLAX 2D LVIDd:         3.50 cm   Diastology LVIDs:         2.40 cm   LV e' medial:    3.66 cm/s LV PW:         1.30 cm   LV E/e' medial:  20.3 LV IVS:        1.30 cm   LV e' lateral:   4.11 cm/s LVOT diam:     2.00 cm   LV E/e' lateral: 18.1 LV SV:         41 LV SV Index:   27 LVOT Area:     3.14 cm  RIGHT VENTRICLE             IVC RV Basal diam:  2.60 cm     IVC diam: 1.20 cm RV S prime:     15.20 cm/s TAPSE (M-mode): 1.9 cm LEFT ATRIUM             Index        RIGHT ATRIUM           Index LA diam:        2.40 cm 1.56 cm/m   RA Area:     17.60 cm LA Vol (A2C):   56.5 ml 36.68 ml/m  RA Volume:   47.95 ml  31.13 ml/m LA Vol (A4C):   37.4 ml 24.28 ml/m LA Biplane Vol: 50.6 ml 32.85 ml/m  AORTIC VALVE                 PULMONIC VALVE AV Area (Vmax): 2.36 cm     PV Vmax:       1.01 m/s AV Vmax:        93.85 cm/s   PV Peak grad:  4.1 mmHg AV Peak Grad:   3.5 mmHg LVOT Vmax:      70.50 cm/s LVOT Vmean:     46.400 cm/s LVOT VTI:       0.130 m  AORTA Ao Root diam: 3.50 cm MITRAL VALVE MV Area (PHT): 4.65 cm    SHUNTS MV Decel Time: 163 msec    Systemic VTI:  0.13 m MV E velocity:  74.40 cm/s  Systemic Diam: 2.00 cm MV A velocity: 70.60 cm/s MV E/A ratio:  1.05 Gwyndolyn Kaufman MD Electronically signed by Gwyndolyn Kaufman MD Signature Date/Time: 03/23/2022/12:58:13 PM    Final  DG Ribs Unilateral Right  Result Date: 03/23/2022 CLINICAL DATA:  Palpable abnormality in the right posterior ribcage, initial encounter EXAM: RIGHT RIBS - 2 VIEW COMPARISON:  None Available. FINDINGS: Cardiac shadow is within normal limits. Aortic calcifications are noted. The lungs are well aerated bilaterally. No acute rib fracture is seen. No pneumothorax is noted. IMPRESSION: No acute rib abnormality noted. No findings to correspond with the given clinical history. Electronically Signed   By: Inez Catalina M.D.   On: 03/23/2022 03:05   MR LUMBAR SPINE WO CONTRAST  Result Date: 03/23/2022 CLINICAL DATA:  Initial evaluation for low back pain, cauda equina syndrome suspected. EXAM: MRI LUMBAR SPINE WITHOUT CONTRAST TECHNIQUE: Multiplanar, multisequence MR imaging of the lumbar spine was performed. No intravenous contrast was administered. COMPARISON:  Prior radiograph from earlier the same day. FINDINGS: Segmentation: Standard. Lowest well-formed disc space labeled the L5-S1 level. Alignment: 2 mm anterolisthesis of L4 on L5, chronic and facet mediated. Alignment otherwise normal preservation of the normal lumbar lordosis. Vertebrae: Vertebral body height maintained without acute or chronic fracture. Bone marrow signal intensity diffusely heterogeneous without worrisome osseous lesion. Reactive marrow edema present about the bilateral L3-4 and L4-5 facets, presumably due to acute facet arthritis. No other abnormal marrow edema. Conus medullaris and cauda equina: Conus extends to the L1 level. Conus and cauda equina appear normal. Paraspinal and other soft tissues: Mild diffuse edema noted within the lower posterior paraspinous soft tissues, likely reactive in nature due to adjacent facet arthritis. No  collections. Asymmetric right renal atrophy. There is question of a heterogeneous partially exophytic mass extending from the posterior right kidney measuring approximately 1.7 cm (series 8, image 9). Few additional benign-appearing cysts noted. Intra-abdominal aneurysm measures up to 4.1 cm in diameter. 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 L1-2: Disc desiccation without significant disc bulge. Mild facet and ligament flavum hypertrophy. No significant spinal stenosis. Foramina remain patent. L2-3: Disc desiccation without significant disc bulge. Mild facet and ligament flavum hypertrophy. No significant spinal stenosis. Foramina remain patent. L3-4: Disc desiccation with minimal annular disc bulge. Mild to moderate facet and ligament flavum hypertrophy. Underlying short pedicles. Resultant mild bilateral subarticular stenosis. Central canal remains patent. Mild bilateral L3 foraminal narrowing. L4-5: Trace anterolisthesis. Disc desiccation with diffuse disc bulge. Mild reactive endplate spurring. Moderate to advanced facet and ligament flavum hypertrophy. Associated trace joint effusions and reactive marrow edema. Resultant severe spinal stenosis with near complete effacement of the thecal sac (series 8, image 28). Mild bilateral L4 foraminal narrowing. L5-S1: Minimal annular disc bulge. Mild facet hypertrophy. No significant spinal stenosis. Foramina remain patent. IMPRESSION: 1. 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 2. 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 Finding could serve as a source for lower back pain. 3. Additional mild for age degenerative changes elsewhere within the lumbar spine without significant stenosis or neural impingement. 4. Question 1.7 cm heterogeneous exophytic mass extending from the posterior right  kidney, indeterminate. This is not well seen on prior noncontrast CT of the abdomen and pelvis performed earlier the same day. Further assessment with dedicated renal mass protocol CT and/or MRI recommended for further characterization. 5. 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 Recommend follow-up every 12 months and vascular consultation. this recommendation follows ACR consensus guidelines: White Paper of the ACR Incidental Findings Committee II on Vascular Findings. J Am Coll Radiol 2013; 10:789-794. Electronically Signed  By: Jeannine Boga M.D.   On: 03/23/2022 00:03   CT ABDOMEN PELVIS WO CONTRAST  Result Date: 03/22/2022 CLINICAL DATA:  Right-sided flank pain, initial encounter EXAM: CT ABDOMEN AND PELVIS WITHOUT CONTRAST TECHNIQUE: Multidetector CT imaging of the abdomen and pelvis was performed following the standard protocol without IV contrast. RADIATION DOSE REDUCTION: This exam was performed according to the departmental dose-optimization program which includes automated exposure control, adjustment of the mA and/or kV according to patient size and/or use of iterative reconstruction technique. COMPARISON:  05/01/2020 FINDINGS: Lower chest: Stable scarring is noted in the lower lobes bilaterally. Hepatobiliary: No focal liver abnormality is seen. No gallstones, gallbladder wall thickening, or biliary dilatation. Pancreas: Unremarkable. No pancreatic ductal dilatation or surrounding inflammatory changes. Spleen: Normal in size without focal abnormality. Adrenals/Urinary Tract: Adrenal glands are within normal limits. Kidneys show no obstructive changes. The right kidney is atrophic with multiple lower pole renal stones stable in appearance from the prior exam. Small right renal cyst is noted stable from the prior exam. No follow-up is recommended. The bladder is partially distended. Stomach/Bowel: No obstructive or inflammatory changes of the colon are noted. The appendix is not visualized. No  inflammatory changes to suggest appendicitis are noted. Fluid is noted within the small bowel although no obstructive changes are seen. The stomach is decompressed. Vascular/Lymphatic: Persistent 3.9 cm infrarenal aortic aneurysm stable from the prior study. No adenopathy is noted. Reproductive: Prostate is unremarkable. Other: No abdominal wall hernia or abnormality. No abdominopelvic ascites. Musculoskeletal: No acute or significant osseous findings. IMPRESSION: No acute abnormality is identified. Stable 3.9 cm infrarenal abdominal aortic aneurysm. Recommend follow-up every 2 years. Reference: J Am Coll Radiol 5366;44:034-742. Electronically Signed   By: Inez Catalina M.D.   On: 03/22/2022 22:43   DG Lumbar Spine Complete  Result Date: 03/22/2022 CLINICAL DATA:  Lower back pain. EXAM: LUMBAR SPINE - COMPLETE 4+ VIEW COMPARISON:  May 03, 2020. FINDINGS: There is no evidence of lumbar spine fracture. Alignment is normal. Intervertebral disc spaces are maintained. IMPRESSION: Negative. Electronically Signed   By: Marijo Conception M.D.   On: 03/22/2022 16:22   DG HIP UNILAT WITH PELVIS 2-3 VIEWS RIGHT  Result Date: 03/22/2022 CLINICAL DATA:  Fall. EXAM: DG HIP (WITH OR WITHOUT PELVIS) 2-3V RIGHT COMPARISON:  None Available. FINDINGS: There is no evidence of hip fracture or dislocation. There is no evidence of arthropathy or other focal bone abnormality. IMPRESSION: Negative. Electronically Signed   By: Marijo Conception M.D.   On: 03/22/2022 16:21    Assessment and Plan:    Left ventricular hypertrophy: See echo results above.  There is also some valvular calcifications but without significant regurgitant or stenotic lesions.  I have reviewed the images.  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  I will forego any PYP scanning and moved to an MRI as an outpatient.  I would also do work-up to exclude multiple myeloma.  I will order the  myeloma panel here presumptively.  Bradycardia: Any recorded bradycardia by pulse is likely under counting his ectopy.  He has had no symptoms.  I will follow-up with an outpatient monitor.  Cardiomyopathy: Ejection fraction is 40 to 45%.  Previously it had been low normal 50 to 55%.  As above.  No change in medicines currently.  Abdominal aortic aneurysm: This was 3.9 cm to 4.1 cm.  This can be followed as an outpatient.  COPD: Per primary team  advising tobacco cessation.  CKD 3B: Avoiding nephrotoxic agents.   For questions or updates, please contact [place] Please consult [email or web address] for contact info under Cardiology/STEMI.   Signed, Minus Breeding, MD  03/24/2022 8:28 AM

## 2022-03-24 NOTE — Progress Notes (Signed)
PROGRESS NOTE    Vincent Baxter  QMV:[id number] DOB: 1935/04/03 DOA: 03/22/2022 PCP: Patient, No Pcp Per   Brief Narrative:  HPI per Dr. Ileene Musa on 03/22/22 Vincent Baxter is a 87 y.o. male with medical history significant of HTN, CHF, COPD, T2DM who presents with back pain.   Pt reports pain is actually a nodule on his right lower rib cage worse when laying on it. Has been there at least 6 months. No hx of rib fractures or trauma. Also has bilateral lower ankle stiffness and trouble ambulating at times. Denies any chest pain, shortness of breath.    In the ED, he was noted to be bradycardic into th 30s but EKG shows frequent PVCs.    WBC 7.6, HGB 13.3, PLT 150   NA 139, K 5, creatinine stable at 1.98   Troponin at 140->130   UDS is positive for THC. UA negative.    CT A/P non contrast showed stable AAA and no other acute findings.    MRI L spine with degenerative changes, lower lumbar facet arthritis with associated reactive marrow edema, most pronounced at L4-5.    **Interim History Patient was complaining of foot pain and stiffness will get foot x-rays.  His bradycardia has improved and echocardiogram was obtained.  Will need PT and OT to further evaluate and treat this is pending to be done  Cardiology was consulted for his bradycardia as well as a reduced ejection fraction LVH and cardiology reviewed the echo images and he does have some valvular calcifications but no significant regurgitant or stenotic lesions.  He does have a abnormal left ventricular wall thickness that is very consistent with probable amyloid and so cardiology is going to forego the PYP scanning and moved to an MRI as an outpatient but they want to exclude multiple myeloma so myeloma panel has been ordered and pending.  Cardiology is recommending outpatient monitor for his bradycardia given that he has no symptoms.   Assessment and Plan: Bradycardia -HR into 30s but frequent bigeminy seen on EKG could  account for this.  -However troponin is elevated. Continue to trend but has been relatively flat on from 130 down to 122 and is now 128 -obtain echocardiogram and showed "Compared to prior TTE in 04/2020, the LVEF has dropped to  40-45% (previously 50-55%). There is severe LVH. Consider CMR for further work-up for possible HCM vs amyloidosis if clinically indicated. " -keep on continuous telemetry -We will consult cardiology in the a.m. given as concern for HCM versus amyloidosis; cardiology evaluating and they are going to forego the PYP scanning and have elected to obtain an MRI in the outpatient setting and recommending excluding multiple myeloma so a myeloma panel been ordered by the cardiology team -Cardiology recommending an outpatient monitor for his bradycardia -Ordered PT OT to further evaluate and treat  Left ventricular hypertrophy -See above as it was noted to be severe -Cardiology planning on outpatient MRI obtaining multiple myeloma panel here   Rib pain -Pt had CT A/P and MRI L spine in ED because he was thought to have flank pain but on exam pt points to a focal nodule on lower right rib cage. Will obtain dedicated rib X-ray. Suspect likely bony overgrowth or lipoma.  -MRI L-spine did show reactive marrow edema around L4-5 but he denies any significant pain there. -Continue monitor and rib pain is improving   Renal mass -Questionable 1.7 cm heterogeneous exophytic mass extending from the posterior right kidney, indeterminate.  -  Further assessment with dedicated renal mass protocol CT and/or MRI recommended for further characterization and this can be done in outpatient setting   AAA (abdominal aortic aneurysm) (HCC) -Noted on CT A/P around 3.9 but on MRI L-spine this is around 4.1cm -need yearly follow up imaging in the outpatient setting   COPD (chronic obstructive pulmonary disease) with emphysema (HCC) -Stable. Has ongoing tobacco use. Encourage cessation.   Essential  hypertension -Controlled. Not on antihypertensives -Cardiology following appreciate further evaluation   DM2 (diabetes mellitus, type 2) (HCC) -No hyperglycemia. Follow with morning labs and if necessary will place on sensitive NovoLog sign scale insulin AC; blood sugars are ranging from 111-115   Chronic kidney disease stage IIIb Metabolic Acidosis -Patient's BUN/creatinine went from 50/2.08 -> 49/2.00 and albumin/creatinine is 50/1.74 -Patient has a slight metabolic acidosis with a CO2 of 18, anion gap of 6, chloride level 112 Avoid further nephrotoxic medications, contrast dyes, hypotension and dehydration to ensure adequate renal perfusion and will need to renally dose medications Repeat CMP in the a.m.   Normocytic anemia -The patient hemoglobin/hematocrit is now 11.7/34.7 yesterday and trended down to 10.7/29.8 -Check anemia panel in the a.m. -Continue monitor for signs and symptoms bleeding -Repeat CBC in the a.m.   Thrombocytopenia -Patient's platelet count is now 141 yesterday and today is 137 -Continue to monitor for signs of bleeding; -No overt bleeding noted   THC abuse -Tested positive on UDS -Substance abuse counseling given  Tobacco abuse -Smoking cessation counseling given -Patient was caught with cigarettes and had smoked up in the bathroom and threw the cigarettes in the trash -Counseled about the no smoking policy in the hospital and the dangers of smoking while in the hospital and was offered a nicotine patch and nicotine gum but patient refused   Bilateral foot pain and stiffness -Obtain x-rays of the foot and ankle -Left ankle complete done and showed "No fracture or dislocation is seen. Degenerative changes are noted with bony spurs in medial malleolus and intertarsal joints" -Right ankle completed showed "No recent fracture is seen. Degenerative changes are noted with bony spurs in medial malleolus and dorsal aspect of talonavicular joint" -Left foot  showed "No fracture or dislocation is seen. Degenerative changes are noted in first metatarsophalangeal joint." -Right foot completed showed "No recent fracture or dislocation is seen. Degenerative changes are noted in first metatarsophalangeal joint and talonavicular joint." -Currently awaiting PT and OT to further evaluate and treat  DVT prophylaxis: enoxaparin (LOVENOX) injection 30 mg Start: 03/23/22 1000    Code Status: Full Code Family Communication: No family currently at bedside  Disposition Plan:  Level of care: Progressive Status is: Observation The patient will require care spanning > 2 midnights and should be moved to inpatient because: Needs cardiac clearance prior to safe discharge disposition as well as evaluation by PT and OT   Consultants:  Cardiology  Procedures:  ECHOCARDIOGRAM IMPRESSIONS     1. Left ventricular ejection fraction, by estimation, is 40 to 45%. The  left ventricle has mildly decreased function. The left ventricle  demonstrates global hypokinesis. There is severe concentric left  ventricular hypertrophy. Left ventricular diastolic   parameters are consistent with Grade II diastolic dysfunction  (pseudonormalization). Elevated left atrial pressure. Given degree of LVH,  there is concern for possible HCM vs amyloidosis. Consider CMR for further  work-up if clinically indicated.   2. Right ventricular systolic function is low normal. The right  ventricular size is normal. Mildly increased right ventricular wall  thickness. Tricuspid regurgitation signal is inadequate for assessing PA  pressure.   3. Left atrial size was moderately dilated.   4. Right atrial size was moderately dilated.   5. The mitral valve is grossly normal. Trivial mitral valve  regurgitation. No evidence of mitral stenosis.   6. The aortic valve is tricuspid. There is moderate calcification of the  aortic valve. There is severe thickening of the aortic valve. Aortic valve   regurgitation is not visualized. Aortic valve sclerosis/calcification is  present, without any evidence of   aortic stenosis.   7. The inferior vena cava is normal in size with greater than 50%  respiratory variability, suggesting right atrial pressure of 3 mmHg.   Comparison(s): Compared to prior TTE in 04/2020, the LVEF has dropped to  40-45% (previously 50-55%). There is severe LVH. Consider CMR for further  work-up for possible HCM vs amyloidosis if clinically indicated.   FINDINGS   Left Ventricle: Left ventricular ejection fraction, by estimation, is 40  to 45%. The left ventricle has mildly decreased function. The left  ventricle demonstrates global hypokinesis. The left ventricular internal  cavity size was normal in size. There is   severe concentric left ventricular hypertrophy. Left ventricular  diastolic parameters are consistent with Grade II diastolic dysfunction  (pseudonormalization). Elevated left atrial pressure.   Right Ventricle: The right ventricular size is normal. Mildly increased  right ventricular wall thickness. Right ventricular systolic function is  low normal. Tricuspid regurgitation signal is inadequate for assessing PA  pressure.   Left Atrium: Left atrial size was moderately dilated.   Right Atrium: Right atrial size was moderately dilated.   Pericardium: There is no evidence of pericardial effusion.   Mitral Valve: The mitral valve is grossly normal. Trivial mitral valve  regurgitation. No evidence of mitral valve stenosis.   Tricuspid Valve: The tricuspid valve is normal in structure. Tricuspid  valve regurgitation is trivial.   Aortic Valve: The aortic valve is tricuspid. There is moderate  calcification of the aortic valve. There is severe thickening of the  aortic valve. Aortic valve regurgitation is not visualized. Aortic valve  sclerosis/calcification is present, without any  evidence of aortic stenosis. Aortic valve peak gradient  measures 3.5 mmHg.   Pulmonic Valve: The pulmonic valve was normal in structure. Pulmonic valve  regurgitation is trivial.   Aorta: The aortic root is normal in size and structure.   Venous: The inferior vena cava is normal in size with greater than 50%  respiratory variability, suggesting right atrial pressure of 3 mmHg.   IAS/Shunts: The atrial septum is grossly normal.      LEFT VENTRICLE  PLAX 2D  LVIDd:         3.50 cm   Diastology  LVIDs:         2.40 cm   LV e' medial:    3.66 cm/s  LV PW:         1.30 cm   LV E/e' medial:  20.3  LV IVS:        1.30 cm   LV e' lateral:   4.11 cm/s  LVOT diam:     2.00 cm   LV E/e' lateral: 18.1  LV SV:         41  LV SV Index:   27  LVOT Area:     3.14 cm      RIGHT VENTRICLE             IVC  RV Basal  diam:  2.60 cm     IVC diam: 1.20 cm  RV S prime:     15.20 cm/s  TAPSE (M-mode): 1.9 cm   LEFT ATRIUM             Index        RIGHT ATRIUM           Index  LA diam:        2.40 cm 1.56 cm/m   RA Area:     17.60 cm  LA Vol (A2C):   56.5 ml 36.68 ml/m  RA Volume:   47.95 ml  31.13 ml/m  LA Vol (A4C):   37.4 ml 24.28 ml/m  LA Biplane Vol: 50.6 ml 32.85 ml/m   AORTIC VALVE                 PULMONIC VALVE  AV Area (Vmax): 2.36 cm     PV Vmax:       1.01 m/s  AV Vmax:        93.85 cm/s   PV Peak grad:  4.1 mmHg  AV Peak Grad:   3.5 mmHg  LVOT Vmax:      70.50 cm/s  LVOT Vmean:     46.400 cm/s  LVOT VTI:       0.130 m     AORTA  Ao Root diam: 3.50 cm   MITRAL VALVE  MV Area (PHT): 4.65 cm    SHUNTS  MV Decel Time: 163 msec    Systemic VTI:  0.13 m  MV E velocity: 74.40 cm/s  Systemic Diam: 2.00 cm  MV A velocity: 70.60 cm/s  MV E/A ratio:  1.05   Antimicrobials:  Anti-infectives (From admission, onward)    None       Subjective: Seen and examined at bedside and he states that he is "still stiff" but not as bad.  Denies any nausea or vomiting.  Denies chest pain or shortness of breath.  Feels okay.  No other concerns  or plaints at this time.  Objective: Vitals:   03/24/22 0449 03/24/22 1024 03/24/22 1425 03/24/22 1427  BP: (!) 89/54 104/70  117/72  Pulse: 85 90 (!) 48 (!) 50  Resp: $Remo'18 16  18  'UsyXv$ Temp: 97.8 F (36.6 C) (!) 97.5 F (36.4 C)  97.7 F (36.5 C)  TempSrc: Oral Oral  Oral  SpO2: 98% 100% 100% 100%  Weight:      Height:        Intake/Output Summary (Last 24 hours) at 03/24/2022 1439 Last data filed at 03/24/2022 1300 Gross per 24 hour  Intake 720 ml  Output --  Net 720 ml   Filed Weights   03/23/22 1703  Weight: 52.2 kg   Examination: Physical Exam:  Constitutional: Thin frail cachectic elderly African-American male currently no acute distress Respiratory: Diminished to auscultation bilaterally, no wheezing, rales, rhonchi or crackles. Normal respiratory effort and patient is not tachypenic. No accessory muscle use.  Unlabored breathing Cardiovascular: RRR, no murmurs / rubs / gallops. S1 and S2 auscultated. No extremity edema.  Abdomen: Soft, non-tender, non-distended. Bowel sounds positive.  GU: Deferred. Musculoskeletal: No clubbing / cyanosis of digits/nails. No joint deformity upper and lower extremities.  Skin: No rashes, lesions, ulcers on limited skin evaluation. No induration; Warm and dry.  Neurologic: CN 2-12 grossly intact with no focal deficits.  Romberg sign and cerebellar reflexes not assessed.  Psychiatric: Normal judgment and insight. Alert and oriented x 3. Normal mood and appropriate  affect.   Data Reviewed: I have personally reviewed following labs and imaging studies  CBC: Recent Labs  Lab 03/21/22 2127 03/22/22 2020 03/23/22 0930 03/24/22 0523  WBC 7.2 7.6 7.0 6.4  NEUTROABS 4.2 4.9 4.1 3.6  HGB 11.8* 13.3 11.7* 10.7*  HCT 33.0* 38.7* 34.7* 29.8*  MCV 94.8 96.3 97.2 94.3  PLT 146* 150 141* 884*   Basic Metabolic Panel: Recent Labs  Lab 03/21/22 2127 03/22/22 2020 03/23/22 0930 03/24/22 0523  NA 142 139 138 136  K 5.2* 5.0 4.9 4.6  CL  115* 112* 111 112*  CO2 19* 21* 20* 18*  GLUCOSE 146* 133* 153* 128*  BUN 50* 48* 49* 50*  CREATININE 2.08* 1.98* 2.00* 1.74*  CALCIUM 9.2 9.3 9.0 8.4*  MG  --   --  1.8 1.8  PHOS  --   --  4.0 3.4   GFR: Estimated Creatinine Clearance: 23.3 mL/min (A) (by C-G formula based on SCr of 1.74 mg/dL (H)). Liver Function Tests: Recent Labs  Lab 03/21/22 2127 03/22/22 2020 03/23/22 0930 03/24/22 0523  AST $Re'18 18 19 'lzn$ 14*  ALT $Re'22 21 19 14  'Kjo$ ALKPHOS 77 82 71 74  BILITOT 0.6 0.8 0.9 0.5  PROT 6.5 7.6 6.6 5.8*  ALBUMIN 3.8 4.3 3.8 3.4*   Recent Labs  Lab 03/21/22 2127  LIPASE 35   No results for input(s): "AMMONIA" in the last 168 hours. Coagulation Profile: No results for input(s): "INR", "PROTIME" in the last 168 hours. Cardiac Enzymes: No results for input(s): "CKTOTAL", "CKMB", "CKMBINDEX", "TROPONINI" in the last 168 hours. BNP (last 3 results) No results for input(s): "PROBNP" in the last 8760 hours. HbA1C: No results for input(s): "HGBA1C" in the last 72 hours. CBG: No results for input(s): "GLUCAP" in the last 168 hours. Lipid Profile: No results for input(s): "CHOL", "HDL", "LDLCALC", "TRIG", "CHOLHDL", "LDLDIRECT" in the last 72 hours. Thyroid Function Tests: No results for input(s): "TSH", "T4TOTAL", "FREET4", "T3FREE", "THYROIDAB" in the last 72 hours. Anemia Panel: No results for input(s): "VITAMINB12", "FOLATE", "FERRITIN", "TIBC", "IRON", "RETICCTPCT" in the last 72 hours. Sepsis Labs: Recent Labs  Lab 03/22/22 2020  LATICACIDVEN 1.4    Recent Results (from the past 240 hour(s))  Blood culture (routine x 2)     Status: None (Preliminary result)   Collection Time: 03/22/22  8:20 PM   Specimen: BLOOD  Result Value Ref Range Status   Specimen Description   Final    BLOOD BLOOD RIGHT FOREARM Performed at [facility] [address], [place], [place] [address]    Special Requests   Final    BOTTLES DRAWN AEROBIC AND ANAEROBIC Blood  Culture adequate volume Performed at [facility] [address], [place], [place] [address]    Culture   Final    NO GROWTH 1 DAY Performed at [facility], [place] [address], [place], [place] [address]    Report Status PENDING  Incomplete  Blood culture (routine x 2)     Status: None (Preliminary result)   Collection Time: 03/22/22  8:50 PM   Specimen: BLOOD  Result Value Ref Range Status   Specimen Description   Final    BLOOD BLOOD LEFT FOREARM Performed at [facility] [address] [place], [place] [address]    Special Requests   Final    BOTTLES DRAWN AEROBIC AND ANAEROBIC Blood Culture adequate volume Performed at [facility] [address] [place], [place] [address]  Culture   Final    NO GROWTH 1 DAY Performed at [facility], [place] [address], [place], [place] [address]    Report Status PENDING  Incomplete  Urine Culture     Status: None   Collection Time: 03/22/22  9:58 PM   Specimen: Urine, Clean Catch  Result Value Ref Range Status   Specimen Description   Final    URINE, CLEAN CATCH Performed at [facility], [place] [address] [place], [place] [address]    Special Requests   Final    NONE Performed at [facility], [place] [address] [place], [place] [address]    Culture   Final    NO GROWTH Performed at [facility], [place] [address] [place],  [address]    Report Status 03/23/2022 FINAL  Final    Radiology Studies: DG Ankle Complete Left  Result Date: 03/23/2022 CLINICAL DATA:  Pain EXAM: LEFT ANKLE COMPLETE - 3+ VIEW COMPARISON:  None Available. FINDINGS: No fracture or dislocation is seen. There are no focal lytic lesions. Degenerative changes with bony spurs are noted in medial malleolus. Small bony spurs are noted in the intertarsal joints. Vascular calcifications are seen in soft tissues. IMPRESSION: No fracture or dislocation is  seen. Degenerative changes are noted with bony spurs in medial malleolus and intertarsal joints. Electronically Signed   By: Elmer Picker M.D.   On: 03/23/2022 13:04   DG Ankle Complete Right  Result Date: 03/23/2022 CLINICAL DATA:  Pain EXAM: RIGHT ANKLE - COMPLETE 3+ VIEW COMPARISON:  None Available. FINDINGS: No fracture or dislocation is seen. Bony spurs are noted at the tip of medial malleolus. Degenerative changes are noted in talonavicular joint. There is cortical protuberance, possibly osteochondroma in the dorsal aspect of anterior talus without break in the cortical margins. This may suggest osteochondroma. Vascular calcifications are seen in soft tissues. IMPRESSION: No recent fracture is seen. Degenerative changes are noted with bony spurs in medial malleolus and dorsal aspect of talonavicular joint. Electronically Signed   By: Elmer Picker M.D.   On: 03/23/2022 13:03   DG Foot Complete Left  Result Date: 03/23/2022 CLINICAL DATA:  Pain EXAM: LEFT FOOT - COMPLETE 3+ VIEW COMPARISON:  None Available. FINDINGS: No fracture or dislocation is seen. There are no focal lytic lesions. Degenerative changes with bony spurs are noted in first metatarsophalangeal joint. Vascular calcifications are seen in soft tissues. IMPRESSION: No fracture or dislocation is seen. Degenerative changes are noted in first metatarsophalangeal joint. Electronically Signed   By: Elmer Picker M.D.   On: 03/23/2022 13:01   DG Foot Complete Right  Result Date: 03/23/2022 CLINICAL DATA:  Pain EXAM: RIGHT FOOT COMPLETE - 3+ VIEW COMPARISON:  None Available. FINDINGS: No fracture or dislocation is seen in right foot. Hallux valgus deformity is noted. Degenerative changes are noted in first metatarsophalangeal joint with bony spurs. There is cortical protuberance in the dorsal aspect of anterior talus. There are scattered vascular calcifications in the soft tissues. IMPRESSION: No recent fracture or  dislocation is seen. Degenerative changes are noted in first metatarsophalangeal joint and talonavicular joint. Electronically Signed   By: Elmer Picker M.D.   On: 03/23/2022 12:59   ECHOCARDIOGRAM COMPLETE  Result Date: 03/23/2022    ECHOCARDIOGRAM REPORT   Patient Name:   Vincent Baxter Date of Exam: 03/23/2022 Medical Rec #:  [id number]      Height:       65.0 in Accession #:  [id number]     Weight:       111.0 lb Date of Birth:  06-Jun-1935     BSA:          1.540 m Patient Age:    78 years       BP:           123/70 mmHg Patient Gender: M              HR:           81 bpm. Exam Location:  Inpatient Procedure: 2D Echo, Cardiac Doppler and Color Doppler Indications:    Abnormal ECG  History:        Patient has prior history of Echocardiogram examinations, most                 recent 05/02/2020. Risk Factors:Hypertension, Diabetes and                 Dyslipidemia.  Sonographer:    Jefferey Pica Referring Phys: [id number] [place]  1. Left ventricular ejection fraction, by estimation, is 40 to 45%. The left ventricle has mildly decreased function. The left ventricle demonstrates global hypokinesis. There is severe concentric left ventricular hypertrophy. Left ventricular diastolic  parameters are consistent with Grade II diastolic dysfunction (pseudonormalization). Elevated left atrial pressure. Given degree of LVH, there is concern for possible HCM vs amyloidosis. Consider CMR for further work-up if clinically indicated.  2. Right ventricular systolic function is low normal. The right ventricular size is normal. Mildly increased right ventricular wall thickness. Tricuspid regurgitation signal is inadequate for assessing PA pressure.  3. Left atrial size was moderately dilated.  4. Right atrial size was moderately dilated.  5. The mitral valve is grossly normal. Trivial mitral valve regurgitation. No evidence of mitral stenosis.  6. The aortic valve is tricuspid. There is moderate  calcification of the aortic valve. There is severe thickening of the aortic valve. Aortic valve regurgitation is not visualized. Aortic valve sclerosis/calcification is present, without any evidence of  aortic stenosis.  7. The inferior vena cava is normal in size with greater than 50% respiratory variability, suggesting right atrial pressure of 3 mmHg. Comparison(s): Compared to prior TTE in 04/2020, the LVEF has dropped to 40-45% (previously 50-55%). There is severe LVH. Consider CMR for further work-up for possible HCM vs amyloidosis if clinically indicated. FINDINGS  Left Ventricle: Left ventricular ejection fraction, by estimation, is 40 to 45%. The left ventricle has mildly decreased function. The left ventricle demonstrates global hypokinesis. The left ventricular internal cavity size was normal in size. There is  severe concentric left ventricular hypertrophy. Left ventricular diastolic parameters are consistent with Grade II diastolic dysfunction (pseudonormalization). Elevated left atrial pressure. Right Ventricle: The right ventricular size is normal. Mildly increased right ventricular wall thickness. Right ventricular systolic function is low normal. Tricuspid regurgitation signal is inadequate for assessing PA pressure. Left Atrium: Left atrial size was moderately dilated. Right Atrium: Right atrial size was moderately dilated. Pericardium: There is no evidence of pericardial effusion. Mitral Valve: The mitral valve is grossly normal. Trivial mitral valve regurgitation. No evidence of mitral valve stenosis. Tricuspid Valve: The tricuspid valve is normal in structure. Tricuspid valve regurgitation is trivial. Aortic Valve: The aortic valve is tricuspid. There is moderate calcification of the aortic valve. There is severe thickening of the aortic valve. Aortic valve regurgitation is not visualized. Aortic valve sclerosis/calcification is present, without any evidence of aortic stenosis. Aortic valve peak  gradient measures  3.5 mmHg. Pulmonic Valve: The pulmonic valve was normal in structure. Pulmonic valve regurgitation is trivial. Aorta: The aortic root is normal in size and structure. Venous: The inferior vena cava is normal in size with greater than 50% respiratory variability, suggesting right atrial pressure of 3 mmHg. IAS/Shunts: The atrial septum is grossly normal.  LEFT VENTRICLE PLAX 2D LVIDd:         3.50 cm   Diastology LVIDs:         2.40 cm   LV e' medial:    3.66 cm/s LV PW:         1.30 cm   LV E/e' medial:  20.3 LV IVS:        1.30 cm   LV e' lateral:   4.11 cm/s LVOT diam:     2.00 cm   LV E/e' lateral: 18.1 LV SV:         41 LV SV Index:   27 LVOT Area:     3.14 cm  RIGHT VENTRICLE             IVC RV Basal diam:  2.60 cm     IVC diam: 1.20 cm RV S prime:     15.20 cm/s TAPSE (M-mode): 1.9 cm LEFT ATRIUM             Index        RIGHT ATRIUM           Index LA diam:        2.40 cm 1.56 cm/m   RA Area:     17.60 cm LA Vol (A2C):   56.5 ml 36.68 ml/m  RA Volume:   47.95 ml  31.13 ml/m LA Vol (A4C):   37.4 ml 24.28 ml/m LA Biplane Vol: 50.6 ml 32.85 ml/m  AORTIC VALVE                 PULMONIC VALVE AV Area (Vmax): 2.36 cm     PV Vmax:       1.01 m/s AV Vmax:        93.85 cm/s   PV Peak grad:  4.1 mmHg AV Peak Grad:   3.5 mmHg LVOT Vmax:      70.50 cm/s LVOT Vmean:     46.400 cm/s LVOT VTI:       0.130 m  AORTA Ao Root diam: 3.50 cm MITRAL VALVE MV Area (PHT): 4.65 cm    SHUNTS MV Decel Time: 163 msec    Systemic VTI:  0.13 m MV E velocity: 74.40 cm/s  Systemic Diam: 2.00 cm MV A velocity: 70.60 cm/s MV E/A ratio:  1.05 Gwyndolyn Kaufman MD Electronically signed by Gwyndolyn Kaufman MD Signature Date/Time: 03/23/2022/12:58:13 PM    Final    DG Ribs Unilateral Right  Result Date: 03/23/2022 CLINICAL DATA:  Palpable abnormality in the right posterior ribcage, initial encounter EXAM: RIGHT RIBS - 2 VIEW COMPARISON:  None Available. FINDINGS: Cardiac shadow is within normal limits. Aortic  calcifications are noted. The lungs are well aerated bilaterally. No acute rib fracture is seen. No pneumothorax is noted. IMPRESSION: No acute rib abnormality noted. No findings to correspond with the given clinical history. Electronically Signed   By: Inez Catalina M.D.   On: 03/23/2022 03:05   MR LUMBAR SPINE WO CONTRAST  Result Date: 03/23/2022 CLINICAL DATA:  Initial evaluation for low back pain, cauda equina syndrome suspected. EXAM: MRI LUMBAR SPINE WITHOUT CONTRAST TECHNIQUE: Multiplanar, multisequence MR imaging of the lumbar spine was  performed. No intravenous contrast was administered. COMPARISON:  Prior radiograph from earlier the same day. FINDINGS: Segmentation: Standard. Lowest well-formed disc space labeled the L5-S1 level. Alignment: 2 mm anterolisthesis of L4 on L5, chronic and facet mediated. Alignment otherwise normal preservation of the normal lumbar lordosis. Vertebrae: Vertebral body height maintained without acute or chronic fracture. Bone marrow signal intensity diffusely heterogeneous without worrisome osseous lesion. Reactive marrow edema present about the bilateral L3-4 and L4-5 facets, presumably due to acute facet arthritis. No other abnormal marrow edema. Conus medullaris and cauda equina: Conus extends to the L1 level. Conus and cauda equina appear normal. Paraspinal and other soft tissues: Mild diffuse edema noted within the lower posterior paraspinous soft tissues, likely reactive in nature due to adjacent facet arthritis. No collections. Asymmetric right renal atrophy. There is question of a heterogeneous partially exophytic mass extending from the posterior right kidney measuring approximately 1.7 cm (series 8, image 9). Few additional benign-appearing cysts noted. Intra-abdominal aneurysm measures up to 4.1 cm in diameter. 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 L1-2: Disc desiccation without significant disc bulge. Mild facet and  ligament flavum hypertrophy. No significant spinal stenosis. Foramina remain patent. L2-3: Disc desiccation without significant disc bulge. Mild facet and ligament flavum hypertrophy. No significant spinal stenosis. Foramina remain patent. L3-4: Disc desiccation with minimal annular disc bulge. Mild to moderate facet and ligament flavum hypertrophy. Underlying short pedicles. Resultant mild bilateral subarticular stenosis. Central canal remains patent. Mild bilateral L3 foraminal narrowing. L4-5: Trace anterolisthesis. Disc desiccation with diffuse disc bulge. Mild reactive endplate spurring. Moderate to advanced facet and ligament flavum hypertrophy. Associated trace joint effusions and reactive marrow edema. Resultant severe spinal stenosis with near complete effacement of the thecal sac (series 8, image 28). Mild bilateral L4 foraminal narrowing. L5-S1: Minimal annular disc bulge. Mild facet hypertrophy. No significant spinal stenosis. Foramina remain patent. IMPRESSION: 1. 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 2. 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 Finding could serve as a source for lower back pain. 3. Additional mild for age degenerative changes elsewhere within the lumbar spine without significant stenosis or neural impingement. 4. Question 1.7 cm heterogeneous exophytic mass extending from the posterior right kidney, indeterminate. This is not well seen on prior noncontrast CT of the abdomen and pelvis performed earlier the same day. Further assessment with dedicated renal mass protocol CT and/or MRI recommended for further characterization. 5. 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 Recommend follow-up every 12 months and vascular consultation. this recommendation follows ACR consensus guidelines: White Paper of the ACR Incidental Findings Committee II on Vascular  Findings. J Am Coll Radiol 2013; 10:789-794. Electronically Signed   By: Jeannine Boga M.D.   On: 03/23/2022 00:03   CT ABDOMEN PELVIS WO CONTRAST  Result Date: 03/22/2022 CLINICAL DATA:  Right-sided flank pain, initial encounter EXAM: CT ABDOMEN AND PELVIS WITHOUT CONTRAST TECHNIQUE: Multidetector CT imaging of the abdomen and pelvis was performed following the standard protocol without IV contrast. RADIATION DOSE REDUCTION: This exam was performed according to the departmental dose-optimization program which includes automated exposure control, adjustment of the mA and/or kV according to patient size and/or use of iterative reconstruction technique. COMPARISON:  05/01/2020 FINDINGS: Lower chest: Stable scarring is noted in the lower lobes bilaterally. Hepatobiliary: No focal liver abnormality is seen. No gallstones, gallbladder wall thickening, or biliary dilatation. Pancreas: Unremarkable. No pancreatic ductal dilatation  or surrounding inflammatory changes. Spleen: Normal in size without focal abnormality. Adrenals/Urinary Tract: Adrenal glands are within normal limits. Kidneys show no obstructive changes. The right kidney is atrophic with multiple lower pole renal stones stable in appearance from the prior exam. Small right renal cyst is noted stable from the prior exam. No follow-up is recommended. The bladder is partially distended. Stomach/Bowel: No obstructive or inflammatory changes of the colon are noted. The appendix is not visualized. No inflammatory changes to suggest appendicitis are noted. Fluid is noted within the small bowel although no obstructive changes are seen. The stomach is decompressed. Vascular/Lymphatic: Persistent 3.9 cm infrarenal aortic aneurysm stable from the prior study. No adenopathy is noted. Reproductive: Prostate is unremarkable. Other: No abdominal wall hernia or abnormality. No abdominopelvic ascites. Musculoskeletal: No acute or significant osseous findings.  IMPRESSION: No acute abnormality is identified. Stable 3.9 cm infrarenal abdominal aortic aneurysm. Recommend follow-up every 2 years. Reference: J Am Coll Radiol 3254;98:264-158. Electronically Signed   By: Inez Catalina M.D.   On: 03/22/2022 22:43   DG Lumbar Spine Complete  Result Date: 03/22/2022 CLINICAL DATA:  Lower back pain. EXAM: LUMBAR SPINE - COMPLETE 4+ VIEW COMPARISON:  May 03, 2020. FINDINGS: There is no evidence of lumbar spine fracture. Alignment is normal. Intervertebral disc spaces are maintained. IMPRESSION: Negative. Electronically Signed   By: Marijo Conception M.D.   On: 03/22/2022 16:22   DG HIP UNILAT WITH PELVIS 2-3 VIEWS RIGHT  Result Date: 03/22/2022 CLINICAL DATA:  Fall. EXAM: DG HIP (WITH OR WITHOUT PELVIS) 2-3V RIGHT COMPARISON:  None Available. FINDINGS: There is no evidence of hip fracture or dislocation. There is no evidence of arthropathy or other focal bone abnormality. IMPRESSION: Negative. Electronically Signed   By: Marijo Conception M.D.   On: 03/22/2022 16:21    Scheduled Meds:  enoxaparin (LOVENOX) injection  30 mg Subcutaneous Q24H   Continuous Infusions:   LOS: 0 days   Raiford Noble, DO [facility] Available via Epic secure chat 7am-7pm After these hours, please refer to coverage provider listed on [email or web address] 03/24/2022, 2:39 PM

## 2022-03-25 ENCOUNTER — Other Ambulatory Visit: Payer: Self-pay | Admitting: Cardiology

## 2022-03-25 DIAGNOSIS — R001 Bradycardia, unspecified: Secondary | ICD-10-CM

## 2022-03-25 DIAGNOSIS — I517 Cardiomegaly: Secondary | ICD-10-CM

## 2022-03-25 LAB — CBC WITH DIFFERENTIAL/PLATELET
Abs Immature Granulocytes: 0.02 10*3/uL (ref 0.00–0.07)
Basophils Absolute: 0 10*3/uL (ref 0.0–0.1)
Basophils Relative: 1 %
Eosinophils Absolute: 0.1 10*3/uL (ref 0.0–0.5)
Eosinophils Relative: 2 %
HCT: 30.6 % — ABNORMAL LOW (ref 39.0–52.0)
Hemoglobin: 10.7 g/dL — ABNORMAL LOW (ref 13.0–17.0)
Immature Granulocytes: 0 %
Lymphocytes Relative: 30 %
Lymphs Abs: 1.7 10*3/uL (ref 0.7–4.0)
MCH: 33.5 pg (ref 26.0–34.0)
MCHC: 35 g/dL (ref 30.0–36.0)
MCV: 95.9 fL (ref 80.0–100.0)
Monocytes Absolute: 0.6 10*3/uL (ref 0.1–1.0)
Monocytes Relative: 10 %
Neutro Abs: 3.3 10*3/uL (ref 1.7–7.7)
Neutrophils Relative %: 57 %
Platelets: 138 10*3/uL — ABNORMAL LOW (ref 150–400)
RBC: 3.19 MIL/uL — ABNORMAL LOW (ref 4.22–5.81)
RDW: 13.3 % (ref 11.5–15.5)
WBC: 5.7 10*3/uL (ref 4.0–10.5)
nRBC: 0 % (ref 0.0–0.2)

## 2022-03-25 LAB — VITAMIN B12: Vitamin B-12: 318 pg/mL (ref 180–914)

## 2022-03-25 LAB — COMPREHENSIVE METABOLIC PANEL
ALT: 16 U/L (ref 0–44)
AST: 13 U/L — ABNORMAL LOW (ref 15–41)
Albumin: 3.5 g/dL (ref 3.5–5.0)
Alkaline Phosphatase: 66 U/L (ref 38–126)
Anion gap: 6 (ref 5–15)
BUN: 48 mg/dL — ABNORMAL HIGH (ref 8–23)
CO2: 18 mmol/L — ABNORMAL LOW (ref 22–32)
Calcium: 8.8 mg/dL — ABNORMAL LOW (ref 8.9–10.3)
Chloride: 114 mmol/L — ABNORMAL HIGH (ref 98–111)
Creatinine, Ser: 1.73 mg/dL — ABNORMAL HIGH (ref 0.61–1.24)
GFR, Estimated: 38 mL/min — ABNORMAL LOW (ref >60)
Glucose, Bld: 135 mg/dL — ABNORMAL HIGH (ref 70–99)
Potassium: 4.9 mmol/L (ref 3.5–5.1)
Sodium: 138 mmol/L (ref 135–145)
Total Bilirubin: 0.5 mg/dL (ref 0.3–1.2)
Total Protein: 6 g/dL — ABNORMAL LOW (ref 6.5–8.1)

## 2022-03-25 LAB — MAGNESIUM: Magnesium: 1.8 mg/dL (ref 1.7–2.4)

## 2022-03-25 LAB — FERRITIN: Ferritin: 224 ng/mL (ref 24–336)

## 2022-03-25 LAB — RETICULOCYTES
Immature Retic Fract: 8.7 % (ref 2.3–15.9)
RBC.: 3.22 MIL/uL — ABNORMAL LOW (ref 4.22–5.81)
Retic Count, Absolute: 39 10*3/uL (ref 19.0–186.0)
Retic Ct Pct: 1.2 % (ref 0.4–3.1)

## 2022-03-25 LAB — PHOSPHORUS: Phosphorus: 3.2 mg/dL (ref 2.5–4.6)

## 2022-03-25 LAB — IRON AND TIBC
Iron: 90 ug/dL (ref 45–182)
Saturation Ratios: 30 % (ref 17.9–39.5)
TIBC: 306 ug/dL (ref 250–450)
UIBC: 216 ug/dL

## 2022-03-25 LAB — FOLATE: Folate: 3.9 ng/mL — ABNORMAL LOW (ref >5.9)

## 2022-03-25 NOTE — Progress Notes (Signed)
Pt complained of his right lower back pain , describing it as " stiffness". Pt was upset because nobody mentioned his " back pain" ,which was the original reason for a hospital visit 4 days ago.  Pt  wants to hear his back problem with a doctor and also expressed he wants to go home.

## 2022-03-25 NOTE — Evaluation (Signed)
Occupational Therapy Evaluation Patient Details Name: Vincent Baxter MRN: [id number] DOB: [birth year] Today's Date: 03/25/2022   History of Present Illness Patient admitted for bradycardia. Vincent Baxter is a 87 y.o. male with medical history significant of HTN, CHF, COPD, T2DM who presents with back pain. Pt reports pain is actually a nodule on his right lower rib cage worse when laying on it. Has been there at least 6 months. No hx of rib fractures or trauma. Also has bilateral lower ankle stiffness and trouble ambulating at times. Marland KitchenMRI L spine with degenerative changes, lower lumbar facet arthritis with associated reactive marrow edema, most pronounced at L4-5.  Patient was complaining of foot pain and stiffness - xray imaging. 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 Cardiology wants to exclude multiple myeloma so myeloma panel has been ordered and pending.   Clinical Impression   Vincent Baxter is an 87 year old man who presents with reports of intermittent pain in a spot on right posterior rib area. He also reports a constellation of other difficulties including recent weight loss and atrophying of lower extremities, numbness in lower extremities predominantly in lower legs and feet, stiffness in his back and feet. However, he is independent with bed mobility, ambulation and ADLs. Therapist in room for increased amount of time as patient detailed in his reports. Patient reports he is "lazy." Therapist encouraged patient to ambulate more to promote decreased stiffness of joints. Patient also found to be cachectic. Patient reports not eating much and eating not on his priority list. Therapist also encouraged patient to increase his nutrition and specifically protein intake. Patient verbalizes understanding. Patient has no OT needs.      Recommendations for follow up therapy are one component of a multi-disciplinary discharge  planning process, led by the attending physician.  Recommendations may be updated based on patient status, additional functional criteria and insurance authorization.   Follow Up Recommendations  No OT follow up    Assistance Recommended at Discharge None  Patient can return home with the following      Functional Status Assessment  Patient has not had a recent decline in their functional status  Equipment Recommendations  None recommended by OT    Recommendations for Other Services       Precautions / Restrictions Precautions Precautions: None Restrictions Weight Bearing Restrictions: No      Mobility Bed Mobility Overal bed mobility: Independent                  Transfers Overall transfer level: Independent                        Balance Overall balance assessment: Independent                                         ADL either performed or assessed with clinical judgement   ADL Overall ADL's : Independent                                             Vision Patient Visual Report: No change from baseline       Perception     Praxis      Pertinent Vitals/Pain  Pain Assessment Pain Assessment: 0-10 Pain Location: r rib nodule area Pain Descriptors / Indicators: Sore Pain Intervention(s): Monitored during session     Hand Dominance Right   Extremity/Trunk Assessment Upper Extremity Assessment Upper Extremity Assessment: RUE deficits/detail;LUE deficits/detail RUE Deficits / Details: WNL ROM, 5/5 strength RUE Sensation:  (reports numbness in fingers) RUE Coordination: WNL LUE Deficits / Details: WNL ROM, 5/5 strength LUE Sensation:  (reports numbness in fingers)   Lower Extremity Assessment Lower Extremity Assessment: Defer to PT evaluation   Cervical / Trunk Assessment Cervical / Trunk Assessment: Normal   Communication Communication Communication: No difficulties   Cognition  Arousal/Alertness: Awake/alert Behavior During Therapy: WFL for tasks assessed/performed Overall Cognitive Status: Within Functional Limits for tasks assessed                                       General Comments       Exercises     Shoulder Instructions      Home Living Family/patient expects to be discharged to:: Private residence Living Arrangements: Alone   Type of Home: House Home Access: Stairs to enter [employer] of Steps: 7 in the back, one in the front   Home Layout: One level     Bathroom Shower/Tub: Teacher, early years/pre: Standard     Home Equipment: None          Prior Functioning/Environment Prior Level of Function : Independent/Modified Independent                        OT Problem List:        OT Treatment/Interventions:      OT Goals(Current goals can be found in the care plan section) Acute Rehab OT Goals OT Goal Formulation: All assessment and education complete, DC therapy  OT Frequency:      Co-evaluation              AM-PAC OT "6 Clicks" Daily Activity     Outcome Measure Help from another person eating meals?: None Help from another person taking care of personal grooming?: None Help from another person toileting, which includes using toliet, bedpan, or urinal?: None Help from another person bathing (including washing, rinsing, drying)?: None Help from another person to put on and taking off regular upper body clothing?: None Help from another person to put on and taking off regular lower body clothing?: None 6 Click Score: 24   End of Session Nurse Communication:  (okay to see)  Activity Tolerance: Patient tolerated treatment well Patient left: in bed;with call bell/phone within reach  OT Visit Diagnosis: Pain                Time: 6712-4580 OT Time Calculation (min): 25 min Charges:  OT General Charges $OT Visit: 1 Visit OT Evaluation $OT Eval Low Complexity: 1  Low  Vincent Baxter, OTR/L Acute Care Rehab Services  Office [phone]   Vincent Baxter 03/25/2022, 10:05 AM

## 2022-03-25 NOTE — Discharge Summary (Signed)
Vincent Baxter [id number] DOB: February 27, 1935 DOA: 03/22/2022  PCP: Patient, No Pcp Per  Admit date: 03/22/2022  Discharge date: 03/25/2022  Admitted From: Home disposition: Home   Recommendations for Outpatient Follow-up:   Follow up with cardiology as directed by them.   Home Health: N/A Equipment/Devices: N/A Consultations: Cardiology Discharge Condition: Stable CODE STATUS: Full Diet Recommendation: Heart Healthy   Diet Order             Diet Heart Room service appropriate? Yes; Fluid consistency: Thin  Diet effective now                    Chief Complaint  Patient presents with   Back Pain     Brief history of present illness from the day of admission and additional interim summary    Vincent Baxter is a 87 y.o. male with medical history significant of HTN, CHF, COPD, T2DM who presents with back pain. Pt reports pain is actually a nodule on his right lower rib cage worse when laying on it. Has been there at least 6 months. No hx of rib fractures or trauma. Also has bilateral lower ankle stiffness and trouble ambulating at times. Denies any chest pain, shortness of breath. In the ED, he was noted to be bradycardic into th 30s with elevatedTroponin at 140->130. CT A/P non contrast showed stable AAA and no other acute findings. MRI L spine with degenerative changes, lower lumbar facet arthritis with associated reactive marrow edema, most pronounced at L4-5.                                                                   Hospital Course   Patient was admitted for work-up of bradycardia and rib pain.  Echocardiogram shows decrease in LVEF to 40 to 45% with severe LVH.  Patient was seen by cardiology who have concern for possible amyloidosis as etiology.  On day of discharge, patient stated "I am  going home, I do not need to be here, you are doing nothing for me here."  Patient does state he understands but amyloidosis and will follow-up with cardiology as an outpatient.  However notes that he is functional at home and does not feel any better in terms of his rib pain here than when he came in.  Bradycardia -HR into 30s but frequent bigeminy seen on EKG could account for this.  -However troponin is elevated but has been relatively flat on from 130 down to 122 and is now 128 -obtain echocardiogram and showed "Compared to prior TTE in 04/2020, the LVEF has dropped to  40-45% (previously 50-55%). There is severe LVH. Consider CMR for further work-up for possible HCM vs amyloidosis if clinically indicated. "  Per cardiology consultation: Left ventricular hypertrophy:   Plan  out patient MRI.   Myeloma panel pending.  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  No change in therapy at this time.      Bradycardia: Any recorded bradycardia by pulse is likely under counting his ectopy. No change in therapy.     Cardiomyopathy: Ejection fraction is 40 to 45%.  Previously it had been low normal 50 to 55%.  As above.  Plan as above.  Avoiding beta blockers with note of bradycardia.  Unable to use ACE/ARB/ARNi with CKD.  BP is labile.  I am not inclined to start hydralazine nitrates and he does not seem to be hypervolemic.  Medical therapy is limited.  Plan will be to complete work up to see if he qualifies for Tifamidis.     Abdominal aortic aneurysm: This was 3.9 cm to 4.1 cm.  This can be followed as an outpatient.    Rib pain -Pt had CT A/P and MRI L spine in ED because he was thought to have flank pain but on exam pt points to a focal nodule on lower right rib cage. Will obtain dedicated rib X-ray. Suspect likely bony overgrowth or lipoma.  -MRI L-spine did show reactive marrow edema around L4-5 but he denies any significant pain there.   Renal mass -Questionable 1.7 cm heterogeneous exophytic mass extending from the  posterior right kidney, indeterminate.  -Further assessment with dedicated renal mass protocol CT and/or MRI recommended for further characterization and this can be done in outpatient setting   AAA (abdominal aortic aneurysm) ([facility]) -Noted on CT A/P around 3.9 but on MRI L-spine this is around 4.1cm -need yearly follow up imaging   COPD (chronic obstructive pulmonary disease) with emphysema ([facility]) -Stable. Has ongoing tobacco use. Encourage cessation.   Essential hypertension Controlled. Not on antihypertensives   DM2 (diabetes mellitus, type 2) ([facility]) No hyperglycemia. Follow with morning labs and if necessary will place on sensitive NovoLog sign scale insulin AC; blood sugars are ranging from 111-115   Chronic kidney disease stage IIIb Metabolic Acidosis -Patient's BUN/creatinine went from 50/2.08 and is now 49/2.00 -Patient has a slight metabolic acidosis with a CO2 of 20, anion gap of 7, chloride level 111 Avoid further nephrotoxic medications, contrast dyes, hypotension and dehydration to ensure adequate renal perfusion and will need to renally dose medications Repeat CMP in the a.m.   Normocytic anemia -The patient hemoglobin/hematocrit is now 11.7/34.7 -Check anemia panel in the a.m. -Continue monitor for signs and symptoms bleeding -Repeat CBC in the a.m.   Thrombocytopenia -Patient's platelet count is now 141 -Continue to monitor for signs of bleeding; -No overt bleeding noted   THC abuse -Tested positive on UDS -Substance abuse counseling given   Bilateral foot pain and stiffness -Obtain x-rays of the foot and ankle -Left ankle complete done and showed "No fracture or dislocation is seen. Degenerative changes are noted with bony spurs in medial malleolus and intertarsal joints" -Right ankle completed showed "No recent fracture is seen. Degenerative changes are noted with bony spurs in medial malleolus and dorsal aspect of talonavicular joint" -Left foot showed "No  fracture or dislocation is seen. Degenerative changes are noted in first metatarsophalangeal joint." -Right foot completed showed "No recent fracture or dislocation is seen. Degenerative changes are noted in first metatarsophalangeal joint and talonavicular joint."    Discharge diagnosis     Principal Problem:   Bradycardia Active Problems:   DM2 (diabetes mellitus, type 2) ([facility])   Essential hypertension   COPD (chronic obstructive pulmonary disease) with emphysema ([facility])  AAA (abdominal aortic aneurysm) ([facility])   Renal mass   Rib pain    Discharge instructions    Discharge Instructions     Discharge instructions   Complete by: As directed    You can continue with the evaluation and treatment for amyloidosis in your heart as an outpatient.  The cardiology department should make you an appointment however if you have not received an appointment in 1 week, please call them to make a follow-up appointment as recommended by Dr. Percival Spanish to continue evaluation and treatment for amyloidosis.   Increase activity slowly   Complete by: As directed        Discharge Medications   Allergies as of 03/25/2022   No Known Allergies      Medication List     STOP taking these medications    aspirin EC 81 MG tablet   atorvastatin 20 MG tablet Commonly known as: LIPITOR   doxycycline 100 MG capsule Commonly known as: VIBRAMYCIN   furosemide 20 MG tablet Commonly known as: Lasix   metoprolol tartrate 25 MG tablet Commonly known as: [place] [facility]. Schedule an appointment as soon as possible for a visit in 1 month(s).   Specialty: Cardiology Why: CALL FOR FOLLOWUP APPOINTMENT AS RECOMMENDED BY DR HOCHREIN TO CONTINUE WORK UP OF AMYLOIDOSIS. Contact information: [address] [place] [phone]                Major procedures and Radiology Reports - PLEASE  review detailed and final reports thoroughly  -     DG Ankle Complete Left  Result Date: 03/23/2022 CLINICAL DATA:  Pain EXAM: LEFT ANKLE COMPLETE - 3+ VIEW COMPARISON:  None Available. FINDINGS: No fracture or dislocation is seen. There are no focal lytic lesions. Degenerative changes with bony spurs are noted in medial malleolus. Small bony spurs are noted in the intertarsal joints. Vascular calcifications are seen in soft tissues. IMPRESSION: No fracture or dislocation is seen. Degenerative changes are noted with bony spurs in medial malleolus and intertarsal joints. Electronically Signed   By: Elmer Picker M.D.   On: 03/23/2022 13:04   DG Ankle Complete Right  Result Date: 03/23/2022 CLINICAL DATA:  Pain EXAM: RIGHT ANKLE - COMPLETE 3+ VIEW COMPARISON:  None Available. FINDINGS: No fracture or dislocation is seen. Bony spurs are noted at the tip of medial malleolus. Degenerative changes are noted in talonavicular joint. There is cortical protuberance, possibly osteochondroma in the dorsal aspect of anterior talus without break in the cortical margins. This may suggest osteochondroma. Vascular calcifications are seen in soft tissues. IMPRESSION: No recent fracture is seen. Degenerative changes are noted with bony spurs in medial malleolus and dorsal aspect of talonavicular joint. Electronically Signed   By: Elmer Picker M.D.   On: 03/23/2022 13:03   DG Foot Complete Left  Result Date: 03/23/2022 CLINICAL DATA:  Pain EXAM: LEFT FOOT - COMPLETE 3+ VIEW COMPARISON:  None Available. FINDINGS: No fracture or dislocation is seen. There are no focal lytic lesions. Degenerative changes with bony spurs are noted in first metatarsophalangeal joint. Vascular calcifications are seen in soft tissues. IMPRESSION: No fracture or dislocation is seen. Degenerative changes are noted in first metatarsophalangeal joint. Electronically Signed   By: Elmer Picker M.D.   On: 03/23/2022 13:01   DG Foot  Complete Right  Result Date: 03/23/2022 CLINICAL DATA:  Pain  EXAM: RIGHT FOOT COMPLETE - 3+ VIEW COMPARISON:  None Available. FINDINGS: No fracture or dislocation is seen in right foot. Hallux valgus deformity is noted. Degenerative changes are noted in first metatarsophalangeal joint with bony spurs. There is cortical protuberance in the dorsal aspect of anterior talus. There are scattered vascular calcifications in the soft tissues. IMPRESSION: No recent fracture or dislocation is seen. Degenerative changes are noted in first metatarsophalangeal joint and talonavicular joint. Electronically Signed   By: Elmer Picker M.D.   On: 03/23/2022 12:59   ECHOCARDIOGRAM COMPLETE  Result Date: 03/23/2022    ECHOCARDIOGRAM REPORT   Patient Name:   Vincent Baxter Date of Exam: 03/23/2022 Medical Rec #:  [id number]      Height:       65.0 in Accession #:    [id number]     Weight:       111.0 lb Date of Birth:  [birth year]     BSA:          1.540 m Patient Age:    13 years       BP:           123/70 mmHg Patient Gender: M              HR:           81 bpm. Exam Location:  Inpatient Procedure: 2D Echo, Cardiac Doppler and Color Doppler Indications:    Abnormal ECG  History:        Patient has prior history of Echocardiogram examinations, most                 recent 05/02/2020. Risk Factors:Hypertension, Diabetes and                 Dyslipidemia.  Sonographer:    Jefferey Pica Referring Phys: [id number] [place]  1. Left ventricular ejection fraction, by estimation, is 40 to 45%. The left ventricle has mildly decreased function. The left ventricle demonstrates global hypokinesis. There is severe concentric left ventricular hypertrophy. Left ventricular diastolic  parameters are consistent with Grade II diastolic dysfunction (pseudonormalization). Elevated left atrial pressure. Given degree of LVH, there is concern for possible HCM vs amyloidosis. Consider CMR for further work-up if clinically indicated.   2. Right ventricular systolic function is low normal. The right ventricular size is normal. Mildly increased right ventricular wall thickness. Tricuspid regurgitation signal is inadequate for assessing PA pressure.  3. Left atrial size was moderately dilated.  4. Right atrial size was moderately dilated.  5. The mitral valve is grossly normal. Trivial mitral valve regurgitation. No evidence of mitral stenosis.  6. The aortic valve is tricuspid. There is moderate calcification of the aortic valve. There is severe thickening of the aortic valve. Aortic valve regurgitation is not visualized. Aortic valve sclerosis/calcification is present, without any evidence of  aortic stenosis.  7. The inferior vena cava is normal in size with greater than 50% respiratory variability, suggesting right atrial pressure of 3 mmHg. Comparison(s): Compared to prior TTE in 04/2020, the LVEF has dropped to 40-45% (previously 50-55%). There is severe LVH. Consider CMR for further work-up for possible HCM vs amyloidosis if clinically indicated. FINDINGS  Left Ventricle: Left ventricular ejection fraction, by estimation, is 40 to 45%. The left ventricle has mildly decreased function. The left ventricle demonstrates global hypokinesis. The left ventricular internal cavity size was normal in size. There is  severe concentric left ventricular hypertrophy. Left ventricular diastolic parameters are consistent with Grade  II diastolic dysfunction (pseudonormalization). Elevated left atrial pressure. Right Ventricle: The right ventricular size is normal. Mildly increased right ventricular wall thickness. Right ventricular systolic function is low normal. Tricuspid regurgitation signal is inadequate for assessing PA pressure. Left Atrium: Left atrial size was moderately dilated. Right Atrium: Right atrial size was moderately dilated. Pericardium: There is no evidence of pericardial effusion. Mitral Valve: The mitral valve is grossly normal. Trivial  mitral valve regurgitation. No evidence of mitral valve stenosis. Tricuspid Valve: The tricuspid valve is normal in structure. Tricuspid valve regurgitation is trivial. Aortic Valve: The aortic valve is tricuspid. There is moderate calcification of the aortic valve. There is severe thickening of the aortic valve. Aortic valve regurgitation is not visualized. Aortic valve sclerosis/calcification is present, without any evidence of aortic stenosis. Aortic valve peak gradient measures 3.5 mmHg. Pulmonic Valve: The pulmonic valve was normal in structure. Pulmonic valve regurgitation is trivial. Aorta: The aortic root is normal in size and structure. Venous: The inferior vena cava is normal in size with greater than 50% respiratory variability, suggesting right atrial pressure of 3 mmHg. IAS/Shunts: The atrial septum is grossly normal.  LEFT VENTRICLE PLAX 2D LVIDd:         3.50 cm   Diastology LVIDs:         2.40 cm   LV e' medial:    3.66 cm/s LV PW:         1.30 cm   LV E/e' medial:  20.3 LV IVS:        1.30 cm   LV e' lateral:   4.11 cm/s LVOT diam:     2.00 cm   LV E/e' lateral: 18.1 LV SV:         41 LV SV Index:   27 LVOT Area:     3.14 cm  RIGHT VENTRICLE             IVC RV Basal diam:  2.60 cm     IVC diam: 1.20 cm RV S prime:     15.20 cm/s TAPSE (M-mode): 1.9 cm LEFT ATRIUM             Index        RIGHT ATRIUM           Index LA diam:        2.40 cm 1.56 cm/m   RA Area:     17.60 cm LA Vol (A2C):   56.5 ml 36.68 ml/m  RA Volume:   47.95 ml  31.13 ml/m LA Vol (A4C):   37.4 ml 24.28 ml/m LA Biplane Vol: 50.6 ml 32.85 ml/m  AORTIC VALVE                 PULMONIC VALVE AV Area (Vmax): 2.36 cm     PV Vmax:       1.01 m/s AV Vmax:        93.85 cm/s   PV Peak grad:  4.1 mmHg AV Peak Grad:   3.5 mmHg LVOT Vmax:      70.50 cm/s LVOT Vmean:     46.400 cm/s LVOT VTI:       0.130 m  AORTA Ao Root diam: 3.50 cm MITRAL VALVE MV Area (PHT): 4.65 cm    SHUNTS MV Decel Time: 163 msec    Systemic VTI:  0.13 m MV E  velocity: 74.40 cm/s  Systemic Diam: 2.00 cm MV A velocity: 70.60 cm/s MV E/A ratio:  1.05 Gwyndolyn Kaufman MD Electronically signed by  Laurance FlattenHeather Pemberton MD Signature Date/Time: 03/23/2022/12:58:13 PM    Final    DG Ribs Unilateral Right  Result Date: 03/23/2022 CLINICAL DATA:  Palpable abnormality in the right posterior ribcage, initial encounter EXAM: RIGHT RIBS - 2 VIEW COMPARISON:  None Available. FINDINGS: Cardiac shadow is within normal limits. Aortic calcifications are noted. The lungs are well aerated bilaterally. No acute rib fracture is seen. No pneumothorax is noted. IMPRESSION: No acute rib abnormality noted. No findings to correspond with the given clinical history. Electronically Signed   By: Alcide CleverMark  Lukens M.D.   On: 03/23/2022 03:05   MR LUMBAR SPINE WO CONTRAST  Result Date: 03/23/2022 CLINICAL DATA:  Initial evaluation for low back pain, cauda equina syndrome suspected. EXAM: MRI LUMBAR SPINE WITHOUT CONTRAST TECHNIQUE: Multiplanar, multisequence MR imaging of the lumbar spine was performed. No intravenous contrast was administered. COMPARISON:  Prior radiograph from earlier the same day. FINDINGS: Segmentation: Standard. Lowest well-formed disc space labeled the L5-S1 level. Alignment: 2 mm anterolisthesis of L4 on L5, chronic and facet mediated. Alignment otherwise normal preservation of the normal lumbar lordosis. Vertebrae: Vertebral body height maintained without acute or chronic fracture. Bone marrow signal intensity diffusely heterogeneous without worrisome osseous lesion. Reactive marrow edema present about the bilateral L3-4 and L4-5 facets, presumably due to acute facet arthritis. No other abnormal marrow edema. Conus medullaris and cauda equina: Conus extends to the L1 level. Conus and cauda equina appear normal. Paraspinal and other soft tissues: Mild diffuse edema noted within the lower posterior paraspinous soft tissues, likely reactive in nature due to adjacent facet arthritis.  No collections. Asymmetric right renal atrophy. There is question of a heterogeneous partially exophytic mass extending from the posterior right kidney measuring approximately 1.7 cm (series 8, image 9). Few additional benign-appearing cysts noted. Intra-abdominal aneurysm measures up to 4.1 cm in diameter. 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 L1-2: Disc desiccation without significant disc bulge. Mild facet and ligament flavum hypertrophy. No significant spinal stenosis. Foramina remain patent. L2-3: Disc desiccation without significant disc bulge. Mild facet and ligament flavum hypertrophy. No significant spinal stenosis. Foramina remain patent. L3-4: Disc desiccation with minimal annular disc bulge. Mild to moderate facet and ligament flavum hypertrophy. Underlying short pedicles. Resultant mild bilateral subarticular stenosis. Central canal remains patent. Mild bilateral L3 foraminal narrowing. L4-5: Trace anterolisthesis. Disc desiccation with diffuse disc bulge. Mild reactive endplate spurring. Moderate to advanced facet and ligament flavum hypertrophy. Associated trace joint effusions and reactive marrow edema. Resultant severe spinal stenosis with near complete effacement of the thecal sac (series 8, image 28). Mild bilateral L4 foraminal narrowing. L5-S1: Minimal annular disc bulge. Mild facet hypertrophy. No significant spinal stenosis. Foramina remain patent. IMPRESSION: 1. 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 2. 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 Finding could serve as a source for lower back pain. 3. Additional mild for age degenerative changes elsewhere within the lumbar spine without significant stenosis or neural impingement. 4. Question 1.7 cm heterogeneous exophytic mass extending from the posterior right  kidney, indeterminate. This is not well seen on prior noncontrast CT of the abdomen and pelvis performed earlier the same day. Further assessment with dedicated renal mass protocol CT and/or MRI recommended for further characterization. 5. 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 Recommend follow-up every 12 months and vascular consultation. this recommendation follows ACR consensus guidelines: White Paper of the ACR Incidental  Findings Committee II on Vascular Findings. J Am Coll Radiol 2013; 10:789-794. Electronically Signed   By: Jeannine Boga M.D.   On: 03/23/2022 00:03   CT ABDOMEN PELVIS WO CONTRAST  Result Date: 03/22/2022 CLINICAL DATA:  Right-sided flank pain, initial encounter EXAM: CT ABDOMEN AND PELVIS WITHOUT CONTRAST TECHNIQUE: Multidetector CT imaging of the abdomen and pelvis was performed following the standard protocol without IV contrast. RADIATION DOSE REDUCTION: This exam was performed according to the departmental dose-optimization program which includes automated exposure control, adjustment of the mA and/or kV according to patient size and/or use of iterative reconstruction technique. COMPARISON:  05/01/2020 FINDINGS: Lower chest: Stable scarring is noted in the lower lobes bilaterally. Hepatobiliary: No focal liver abnormality is seen. No gallstones, gallbladder wall thickening, or biliary dilatation. Pancreas: Unremarkable. No pancreatic ductal dilatation or surrounding inflammatory changes. Spleen: Normal in size without focal abnormality. Adrenals/Urinary Tract: Adrenal glands are within normal limits. Kidneys show no obstructive changes. The right kidney is atrophic with multiple lower pole renal stones stable in appearance from the prior exam. Small right renal cyst is noted stable from the prior exam. No follow-up is recommended. The bladder is partially distended. Stomach/Bowel: No obstructive or inflammatory changes of the colon are noted. The appendix is not visualized. No  inflammatory changes to suggest appendicitis are noted. Fluid is noted within the small bowel although no obstructive changes are seen. The stomach is decompressed. Vascular/Lymphatic: Persistent 3.9 cm infrarenal aortic aneurysm stable from the prior study. No adenopathy is noted. Reproductive: Prostate is unremarkable. Other: No abdominal wall hernia or abnormality. No abdominopelvic ascites. Musculoskeletal: No acute or significant osseous findings. IMPRESSION: No acute abnormality is identified. Stable 3.9 cm infrarenal abdominal aortic aneurysm. Recommend follow-up every 2 years. Reference: J Am Coll Radiol [id number]. Electronically Signed   By: Inez Catalina M.D.   On: 03/22/2022 22:43   DG Lumbar Spine Complete  Result Date: 03/22/2022 CLINICAL DATA:  Lower back pain. EXAM: LUMBAR SPINE - COMPLETE 4+ VIEW COMPARISON:  May 03, 2020. FINDINGS: There is no evidence of lumbar spine fracture. Alignment is normal. Intervertebral disc spaces are maintained. IMPRESSION: Negative. Electronically Signed   By: Marijo Conception M.D.   On: 03/22/2022 16:22   DG HIP UNILAT WITH PELVIS 2-3 VIEWS RIGHT  Result Date: 03/22/2022 CLINICAL DATA:  [place] EXAM: DG HIP (WITH OR WITHOUT PELVIS) 2-3V RIGHT COMPARISON:  None Available. FINDINGS: There is no evidence of hip fracture or dislocation. There is no evidence of arthropathy or other focal bone abnormality. IMPRESSION: Negative. Electronically Signed   By: Marijo Conception M.D.   On: 03/22/2022 16:21    Micro Results    Recent Results (from the past 240 hour(s))  Blood culture (routine x 2)     Status: None (Preliminary result)   Collection Time: 03/22/22  8:20 PM   Specimen: BLOOD  Result Value Ref Range Status   Specimen Description   Final    BLOOD BLOOD RIGHT FOREARM Performed at [facility] [address] [place], [place] [address]    Special Requests   Final    BOTTLES DRAWN AEROBIC AND ANAEROBIC Blood Culture adequate  volume Performed at [facility] [address] [place], [place] [address]    Culture   Final    NO GROWTH 2 DAYS Performed at [facility] [address], [place], [place] [address]    Report Status PENDING  Incomplete  Blood culture (routine x 2)  Status: None (Preliminary result)   Collection Time: 03/22/22  8:50 PM   Specimen: BLOOD  Result Value Ref Range Status   Specimen Description   Final    BLOOD BLOOD LEFT FOREARM Performed at [facility] [address] [place], [place] [address]    Special Requests   Final    BOTTLES DRAWN AEROBIC AND ANAEROBIC Blood Culture adequate volume Performed at [facility] [address], [place], [place] [address]    Culture   Final    NO GROWTH 2 DAYS Performed at [facility] [address], [place], [place] [address]    Report Status PENDING  Incomplete  Urine Culture     Status: None   Collection Time: 03/22/22  9:58 PM   Specimen: Urine, Clean Catch  Result Value Ref Range Status   Specimen Description   Final    URINE, CLEAN CATCH Performed at [facility], [place] [address], [place], [place] [address]    Special Requests   Final    NONE Performed at [facility], [place] [address], [place], [place] [address]    Culture   Final    NO GROWTH Performed at [facility], [place] [address], [place], [place] [address]    Report Status 03/23/2022 FINAL  Final    Today   Subjective    Vincent Baxter feels much improved since admission.  Feels ready to go home.  Denies chest pain, shortness of breath or abdominal pain.  Feels they can take care of themselves with the resources they have at home.  Objective   Blood pressure 111/67, pulse 83, temperature (!) 97.5 F (36.4 C), temperature source Oral, resp. rate 20, height 5\' 7"  (1.702 m), weight 52.2 kg, SpO2 100 %.   Intake/Output Summary (Last 24 hours) at  03/25/2022 1712 Last data filed at 03/25/2022 1100 Gross per 24 hour  Intake 720 ml  Output 700 ml  Net 20 ml    Exam General: Patient appears well and in good spirits sitting up in bed in no acute distress.  Eyes: sclera anicteric, conjuctiva mild injection bilaterally CVS: S1-S2, regular  Respiratory:  decreased air entry bilaterally secondary to decreased inspiratory effort, rales at bases  GI: NABS, soft, NT  LE: No edema.  Neuro: A/O x 3, Moving all extremities equally with normal strength, CN 3-12 intact, grossly nonfocal.  Psych: patient is logical and coherent, judgement and insight appear normal, mood and affect appropriate to situation.    Data Review   CBC w Diff:  Lab Results  Component Value Date   WBC 5.7 03/25/2022   HGB 10.7 (L) 03/25/2022   HCT 30.6 (L) 03/25/2022   PLT 138 (L) 03/25/2022   LYMPHOPCT 30 03/25/2022   MONOPCT 10 03/25/2022   EOSPCT 2 03/25/2022   BASOPCT 1 03/25/2022    CMP:  Lab Results  Component Value Date   NA 138 03/25/2022   K 4.9 03/25/2022   CL 114 (H) 03/25/2022   CO2 18 (L) 03/25/2022   BUN 48 (H) 03/25/2022   CREATININE 1.73 (H) 03/25/2022   PROT 6.0 (L) 03/25/2022   ALBUMIN 3.5 03/25/2022   BILITOT 0.5 03/25/2022   ALKPHOS 66 03/25/2022   AST 13 (L) 03/25/2022   ALT 16 03/25/2022  .   Total Time in preparing paper work, data evaluation and todays exam - 35 minutes  Vashti Hey M.D on 03/25/2022 at 5:12 PM  [facility]

## 2022-03-25 NOTE — Progress Notes (Signed)
Progress Note  Patient Name: Vincent Baxter Date of Encounter: 03/25/2022  Primary Cardiologist:   None   Subjective   He still has back pain.  He wants to leave today.  No SOB.   Inpatient Medications    Scheduled Meds:  enoxaparin (LOVENOX) injection  30 mg Subcutaneous Q24H   Continuous Infusions:  PRN Meds: acetaminophen, mouth rinse   Vital Signs    Vitals:   03/24/22 1425 03/24/22 1427 03/24/22 2132 03/25/22 0543  BP:  117/72 130/87 111/67  Pulse: (!) 48 (!) 50 87 83  Resp:  18 18 20   Temp:  97.7 F (36.5 C) 97.8 F (36.6 C) (!) 97.5 F (36.4 C)  TempSrc:  Oral Oral Oral  SpO2: 100% 100% 100% 100%  Weight:      Height:        Intake/Output Summary (Last 24 hours) at 03/25/2022 0737 Last data filed at 03/24/2022 2240 Gross per 24 hour  Intake 1200 ml  Output 300 ml  Net 900 ml   Filed Weights   03/23/22 1703  Weight: 52.2 kg    Telemetry    NSR, PACs - Personally Reviewed  ECG    NA - Personally Reviewed  Physical Exam   GEN: No acute distress.   Neck: No  JVD Cardiac: RRR, no murmurs, rubs, or gallops.  Respiratory: Clear  to auscultation bilaterally. GI: Soft, nontender, non-distended  MS: No  edema; No deformity.  Decreased DP/PT bilateral Neuro:  Nonfocal  Psych: Normal affect   Labs    Chemistry Recent Labs  Lab 03/23/22 0930 03/24/22 0523 03/25/22 0537  NA 138 136 138  K 4.9 4.6 4.9  CL 111 112* 114*  CO2 20* 18* 18*  GLUCOSE 153* 128* 135*  BUN 49* 50* 48*  CREATININE 2.00* 1.74* 1.73*  CALCIUM 9.0 8.4* 8.8*  PROT 6.6 5.8* 6.0*  ALBUMIN 3.8 3.4* 3.5  AST 19 14* 13*  ALT 19 14 16   ALKPHOS 71 74 66  BILITOT 0.9 0.5 0.5  GFRNONAA 32* 38* 38*  ANIONGAP 7 6 6      Hematology Recent Labs  Lab 03/23/22 0930 03/24/22 0523 03/25/22 0537  WBC 7.0 6.4 5.7  RBC 3.57* 3.16* 3.19*  3.22*  HGB 11.7* 10.7* 10.7*  HCT 34.7* 29.8* 30.6*  MCV 97.2 94.3 95.9  MCH 32.8 33.9 33.5  MCHC 33.7 35.9 35.0  RDW 13.4 13.2  13.3  PLT 141* 137* 138*    Cardiac EnzymesNo results for input(s): "TROPONINI" in the last 168 hours. No results for input(s): "TROPIPOC" in the last 168 hours.   BNPNo results for input(s): "BNP", "PROBNP" in the last 168 hours.   DDimer No results for input(s): "DDIMER" in the last 168 hours.   Radiology    DG Ankle Complete Left  Result Date: 03/23/2022 CLINICAL DATA:  Pain EXAM: LEFT ANKLE COMPLETE - 3+ VIEW COMPARISON:  None Available. FINDINGS: No fracture or dislocation is seen. There are no focal lytic lesions. Degenerative changes with bony spurs are noted in medial malleolus. Small bony spurs are noted in the intertarsal joints. Vascular calcifications are seen in soft tissues. IMPRESSION: No fracture or dislocation is seen. Degenerative changes are noted with bony spurs in medial malleolus and intertarsal joints. Electronically Signed   By: Elmer Picker M.D.   On: 03/23/2022 13:04   DG Ankle Complete Right  Result Date: 03/23/2022 CLINICAL DATA:  Pain EXAM: RIGHT ANKLE - COMPLETE 3+ VIEW COMPARISON:  None Available. FINDINGS:  No fracture or dislocation is seen. Bony spurs are noted at the tip of medial malleolus. Degenerative changes are noted in talonavicular joint. There is cortical protuberance, possibly osteochondroma in the dorsal aspect of anterior talus without break in the cortical margins. This may suggest osteochondroma. Vascular calcifications are seen in soft tissues. IMPRESSION: No recent fracture is seen. Degenerative changes are noted with bony spurs in medial malleolus and dorsal aspect of talonavicular joint. Electronically Signed   By: Elmer Picker M.D.   On: 03/23/2022 13:03   DG Foot Complete Left  Result Date: 03/23/2022 CLINICAL DATA:  Pain EXAM: LEFT FOOT - COMPLETE 3+ VIEW COMPARISON:  None Available. FINDINGS: No fracture or dislocation is seen. There are no focal lytic lesions. Degenerative changes with bony spurs are noted in first  metatarsophalangeal joint. Vascular calcifications are seen in soft tissues. IMPRESSION: No fracture or dislocation is seen. Degenerative changes are noted in first metatarsophalangeal joint. Electronically Signed   By: Elmer Picker M.D.   On: 03/23/2022 13:01   DG Foot Complete Right  Result Date: 03/23/2022 CLINICAL DATA:  Pain EXAM: RIGHT FOOT COMPLETE - 3+ VIEW COMPARISON:  None Available. FINDINGS: No fracture or dislocation is seen in right foot. Hallux valgus deformity is noted. Degenerative changes are noted in first metatarsophalangeal joint with bony spurs. There is cortical protuberance in the dorsal aspect of anterior talus. There are scattered vascular calcifications in the soft tissues. IMPRESSION: No recent fracture or dislocation is seen. Degenerative changes are noted in first metatarsophalangeal joint and talonavicular joint. Electronically Signed   By: Elmer Picker M.D.   On: 03/23/2022 12:59   ECHOCARDIOGRAM COMPLETE  Result Date: 03/23/2022    ECHOCARDIOGRAM REPORT   Patient Name:   Vincent Baxter Date of Exam: 03/23/2022 Medical Rec #:  [id number]      Height:       65.0 in Accession #:    [id number]     Weight:       111.0 lb Date of Birth:  22-Mar-1935     BSA:          1.540 m Patient Age:    87 years       BP:           123/70 mmHg Patient Gender: M              HR:           81 bpm. Exam Location:  Inpatient Procedure: 2D Echo, Cardiac Doppler and Color Doppler Indications:    Abnormal ECG  History:        Patient has prior history of Echocardiogram examinations, most                 recent 05/02/2020. Risk Factors:Hypertension, Diabetes and                 Dyslipidemia.  Sonographer:    Jefferey Pica Referring Phys: [id number] [place]  1. Left ventricular ejection fraction, by estimation, is 40 to 45%. The left ventricle has mildly decreased function. The left ventricle demonstrates global hypokinesis. There is severe concentric left ventricular  hypertrophy. Left ventricular diastolic  parameters are consistent with Grade II diastolic dysfunction (pseudonormalization). Elevated left atrial pressure. Given degree of LVH, there is concern for possible HCM vs amyloidosis. Consider CMR for further work-up if clinically indicated.  2. Right ventricular systolic function is low normal. The right ventricular size is normal. Mildly increased right ventricular wall thickness. Tricuspid regurgitation  signal is inadequate for assessing PA pressure.  3. Left atrial size was moderately dilated.  4. Right atrial size was moderately dilated.  5. The mitral valve is grossly normal. Trivial mitral valve regurgitation. No evidence of mitral stenosis.  6. The aortic valve is tricuspid. There is moderate calcification of the aortic valve. There is severe thickening of the aortic valve. Aortic valve regurgitation is not visualized. Aortic valve sclerosis/calcification is present, without any evidence of  aortic stenosis.  7. The inferior vena cava is normal in size with greater than 50% respiratory variability, suggesting right atrial pressure of 3 mmHg. Comparison(s): Compared to prior TTE in 04/2020, the LVEF has dropped to 40-45% (previously 50-55%). There is severe LVH. Consider CMR for further work-up for possible HCM vs amyloidosis if clinically indicated. FINDINGS  Left Ventricle: Left ventricular ejection fraction, by estimation, is 40 to 45%. The left ventricle has mildly decreased function. The left ventricle demonstrates global hypokinesis. The left ventricular internal cavity size was normal in size. There is  severe concentric left ventricular hypertrophy. Left ventricular diastolic parameters are consistent with Grade II diastolic dysfunction (pseudonormalization). Elevated left atrial pressure. Right Ventricle: The right ventricular size is normal. Mildly increased right ventricular wall thickness. Right ventricular systolic function is low normal. Tricuspid  regurgitation signal is inadequate for assessing PA pressure. Left Atrium: Left atrial size was moderately dilated. Right Atrium: Right atrial size was moderately dilated. Pericardium: There is no evidence of pericardial effusion. Mitral Valve: The mitral valve is grossly normal. Trivial mitral valve regurgitation. No evidence of mitral valve stenosis. Tricuspid Valve: The tricuspid valve is normal in structure. Tricuspid valve regurgitation is trivial. Aortic Valve: The aortic valve is tricuspid. There is moderate calcification of the aortic valve. There is severe thickening of the aortic valve. Aortic valve regurgitation is not visualized. Aortic valve sclerosis/calcification is present, without any evidence of aortic stenosis. Aortic valve peak gradient measures 3.5 mmHg. Pulmonic Valve: The pulmonic valve was normal in structure. Pulmonic valve regurgitation is trivial. Aorta: The aortic root is normal in size and structure. Venous: The inferior vena cava is normal in size with greater than 50% respiratory variability, suggesting right atrial pressure of 3 mmHg. IAS/Shunts: The atrial septum is grossly normal.  LEFT VENTRICLE PLAX 2D LVIDd:         3.50 cm   Diastology LVIDs:         2.40 cm   LV e' medial:    3.66 cm/s LV PW:         1.30 cm   LV E/e' medial:  20.3 LV IVS:        1.30 cm   LV e' lateral:   4.11 cm/s LVOT diam:     2.00 cm   LV E/e' lateral: 18.1 LV SV:         41 LV SV Index:   27 LVOT Area:     3.14 cm  RIGHT VENTRICLE             IVC RV Basal diam:  2.60 cm     IVC diam: 1.20 cm RV S prime:     15.20 cm/s TAPSE (M-mode): 1.9 cm LEFT ATRIUM             Index        RIGHT ATRIUM           Index LA diam:        2.40 cm 1.56 cm/m   RA Area:  17.60 cm LA Vol (A2C):   56.5 ml 36.68 ml/m  RA Volume:   47.95 ml  31.13 ml/m LA Vol (A4C):   37.4 ml 24.28 ml/m LA Biplane Vol: 50.6 ml 32.85 ml/m  AORTIC VALVE                 PULMONIC VALVE AV Area (Vmax): 2.36 cm     PV Vmax:       1.01 m/s AV  Vmax:        93.85 cm/s   PV Peak grad:  4.1 mmHg AV Peak Grad:   3.5 mmHg LVOT Vmax:      70.50 cm/s LVOT Vmean:     46.400 cm/s LVOT VTI:       0.130 m  AORTA Ao Root diam: 3.50 cm MITRAL VALVE MV Area (PHT): 4.65 cm    SHUNTS MV Decel Time: 163 msec    Systemic VTI:  0.13 m MV E velocity: 74.40 cm/s  Systemic Diam: 2.00 cm MV A velocity: 70.60 cm/s MV E/A ratio:  1.05 Gwyndolyn Kaufman MD Electronically signed by Gwyndolyn Kaufman MD Signature Date/Time: 03/23/2022/12:58:13 PM    Final     Cardiac Studies   See echo above.   Patient Profile     87 y.o. male with a hx of diastolic heart failure who is being seen today for the evaluation of bradycardia, reduced ejection fraction and left ventricular hypertrophy at the request of Dr. Alfredia Ferguson.   Assessment & Plan    Left ventricular hypertrophy:   Plan out patient MRI.   Myeloma panel pending.  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  No change in therapy at this time.     Bradycardia: Any recorded bradycardia by pulse is likely under counting his ectopy. No change in therapy.     Cardiomyopathy: Ejection fraction is 40 to 45%.  Previously it had been low normal 50 to 55%.  As above.  Plan as above.  Avoiding beta blockers with note of bradycardia.  Unable to use ACE/ARB/ARNi with CKD.  BP is labile.  I am not inclined to start hydralazine nitrates and he does not seem to be hypervolemic.  Medical therapy is limited.  Plan will be to complete work up to see if he qualifies for Tifamidis.    Abdominal aortic aneurysm: This was 3.9 cm to 4.1 cm.  This can be followed as an outpatient.   COPD: Per primary team advising tobacco cessation.   CKD 3B: Avoiding nephrotoxic agents.  Creat remains stable.      For questions or updates, please contact [place] Please consult [email or web address] for contact info under Cardiology/STEMI.   Signed, Minus Breeding, MD  03/25/2022, 7:37 AM

## 2022-03-25 NOTE — Progress Notes (Signed)
Discharge paperwork was reviewed with pt and Ivs removed. Pt medically stable. Pt driving himself home, MD made aware of this.

## 2022-03-26 ENCOUNTER — Ambulatory Visit: Payer: Medicare Other | Attending: Cardiology

## 2022-03-26 DIAGNOSIS — R001 Bradycardia, unspecified: Secondary | ICD-10-CM

## 2022-03-26 NOTE — Progress Notes (Unsigned)
Enrolled for [employer] to mail a ZIO XT long term holter monitor to the patients address on file.   Dr. Hochrein to read. 

## 2022-03-28 LAB — CULTURE, BLOOD (ROUTINE X 2)
Culture: NO GROWTH
Culture: NO GROWTH
Special Requests: ADEQUATE
Special Requests: ADEQUATE

## 2022-03-29 LAB — MULTIPLE MYELOMA PANEL, SERUM
Albumin SerPl Elph-Mcnc: 3.2 g/dL (ref 2.9–4.4)
Albumin/Glob SerPl: 1.7 (ref 0.7–1.7)
Alpha 1: 0.1 g/dL (ref 0.0–0.4)
Alpha2 Glob SerPl Elph-Mcnc: 0.5 g/dL (ref 0.4–1.0)
B-Globulin SerPl Elph-Mcnc: 0.6 g/dL — ABNORMAL LOW (ref 0.7–1.3)
Gamma Glob SerPl Elph-Mcnc: 0.8 g/dL (ref 0.4–1.8)
Globulin, Total: 2 g/dL — ABNORMAL LOW (ref 2.2–3.9)
IgA: 202 mg/dL (ref 61–437)
IgG (Immunoglobin G), Serum: 851 mg/dL (ref 603–1613)
IgM (Immunoglobulin M), Srm: 56 mg/dL (ref 15–143)
Total Protein ELP: 5.2 g/dL — ABNORMAL LOW (ref 6.0–8.5)

## 2022-04-03 ENCOUNTER — Encounter: Payer: Self-pay | Admitting: *Deleted

## 2022-04-19 DIAGNOSIS — I429 Cardiomyopathy, unspecified: Secondary | ICD-10-CM | POA: Insufficient documentation

## 2022-04-19 DIAGNOSIS — I517 Cardiomegaly: Secondary | ICD-10-CM | POA: Insufficient documentation

## 2022-04-19 NOTE — Progress Notes (Deleted)
Cardiology Office Note   Date:  04/19/2022   ID:  Vincent Baxter, DOB 1934-07-30, MRN [id number]  PCP:  Patient, No Pcp Per  Cardiologist:   None Referring:  ***  No chief complaint on file.     History of Present Illness: Vincent Baxter is a 87 y.o. male who presents for ***    I saw the patient in the hospital in October for management of bradycardia.  He had an ejection fraction of 40 to 45% and previously had been 50 to 55%.  We were unable to titrate meds with bradycardia, renal insufficiency and labile blood pressures.  The echo had suggested significant LVH which is HOCM versus amyloid.   Past Medical History:  Diagnosis Date   Diabetes mellitus 01/2009 dx   DYSLIPIDEMIA    GERD    Hypertension    Kidney stones    RENAL CALCULUS 01/04/2009    Past Surgical History:  Procedure Laterality Date   NO PAST SURGERIES       No current outpatient medications on file.   No current facility-administered medications for this visit.    Allergies:   Patient has no known allergies.    Social History:  The patient  reports that he has been smoking cigarettes. He has been smoking an average of .5 packs per day. He has never used smokeless tobacco. He reports that he does not drink alcohol and does not use drugs.   Family History:  The patient's ***family history includes Hypertension in his mother.    ROS:  Please see the history of present illness.   Otherwise, review of systems are positive for {NONE DEFAULTED:18576}.   All other systems are reviewed and negative.    PHYSICAL EXAM: VS:  There were no vitals taken for this visit. , BMI There is no height or weight on file to calculate BMI. GENERAL:  Well appearing HEENT:  Pupils equal round and reactive, fundi not visualized, oral mucosa unremarkable NECK:  No jugular venous distention, waveform within normal limits, carotid upstroke brisk and symmetric, no bruits, no thyromegaly LYMPHATICS:  No cervical, inguinal  adenopathy LUNGS:  Clear to auscultation bilaterally BACK:  No CVA tenderness CHEST:  Unremarkable HEART:  PMI not displaced or sustained,S1 and S2 within normal limits, no S3, no S4, no clicks, no rubs, *** murmurs ABD:  Flat, positive bowel sounds normal in frequency in pitch, no bruits, no rebound, no guarding, no midline pulsatile mass, no hepatomegaly, no splenomegaly EXT:  2 plus pulses throughout, no edema, no cyanosis no clubbing SKIN:  No rashes no nodules NEURO:  Cranial nerves II through XII grossly intact, motor grossly intact throughout PSYCH:  Cognitively intact, oriented to person place and time    EKG:  EKG {ACTION; IS/IS HWE:[id number]} ordered today. The ekg ordered today demonstrates ***   Recent Labs: 10/30/2021: B Natriuretic Peptide 651.1 03/25/2022: ALT 16; BUN 48; Creatinine, Ser 1.73; Hemoglobin 10.7; Magnesium 1.8; Platelets 138; Potassium 4.9; Sodium 138    Lipid Panel    Component Value Date/Time   CHOL 151 02/11/2012 0931   TRIG 106.0 02/11/2012 0931   HDL 58.50 02/11/2012 0931   CHOLHDL 3 02/11/2012 0931   VLDL 21.2 02/11/2012 0931   LDLCALC 71 02/11/2012 0931   LDLDIRECT 117.7 01/18/2009 1047      Wt Readings from Last 3 Encounters:  03/23/22 115 lb (52.2 kg)  05/03/20 111 lb (50.3 kg)  11/11/12 120 lb 8 oz (54.7 kg)  Other studies Reviewed: Additional studies/ records that were reviewed today include: ***. Review of the above records demonstrates:  Please see elsewhere in the note.  ***   ASSESSMENT AND PLAN:  Abdominal aortic aneurysm: 3.9 cm on MRI.  This can be followed up with 1 year screening.***  Hypertension: As mentioned this was labile.***  Renal mass: I will defer to primary providers.  He was supposed to have dedicated scanning for this.  CKD 3B: Baseline creatinine was 2.0.  Cardiomyopathy: EF was 40 to 45%.  Med titration was limited as above.  There was a strong suspicion for amyloidosis.  Myeloma panel  demonstrated ***.  He is to get an MRI.   Current medicines are reviewed at length with the patient today.  The patient {ACTIONS; HAS/DOES NOT HAVE:19233} concerns regarding medicines.  The following changes have been made:  {PLAN; NO CHANGE:13088:s}  Labs/ tests ordered today include: *** No orders of the defined types were placed in this encounter.    Disposition:   FU with ***    Signed, Minus Breeding, MD  04/19/2022 5:14 PM    [place]

## 2022-04-20 ENCOUNTER — Ambulatory Visit: Payer: Medicare Other | Attending: Cardiology | Admitting: Cardiology

## 2022-04-20 DIAGNOSIS — I10 Essential (primary) hypertension: Secondary | ICD-10-CM

## 2022-04-20 DIAGNOSIS — E785 Hyperlipidemia, unspecified: Secondary | ICD-10-CM

## 2022-04-20 DIAGNOSIS — I517 Cardiomegaly: Secondary | ICD-10-CM

## 2022-04-20 DIAGNOSIS — I714 Abdominal aortic aneurysm, without rupture, unspecified: Secondary | ICD-10-CM

## 2022-04-20 DIAGNOSIS — I429 Cardiomyopathy, unspecified: Secondary | ICD-10-CM

## 2022-04-30 ENCOUNTER — Encounter: Payer: Self-pay | Admitting: Cardiology

## 2022-06-20 ENCOUNTER — Telehealth (HOSPITAL_COMMUNITY): Payer: Self-pay | Admitting: *Deleted

## 2022-06-20 ENCOUNTER — Encounter (HOSPITAL_COMMUNITY): Payer: Self-pay

## 2022-06-20 ENCOUNTER — Ambulatory Visit (HOSPITAL_COMMUNITY)
Admission: EM | Admit: 2022-06-20 | Discharge: 2022-06-20 | Disposition: A | Discharge status: Home / Self Care | Payer: Medicare Other | Attending: Family Medicine | Admitting: Family Medicine

## 2022-06-20 ENCOUNTER — Ambulatory Visit (INDEPENDENT_AMBULATORY_CARE_PROVIDER_SITE_OTHER): Payer: Medicare Other

## 2022-06-20 DIAGNOSIS — I10 Essential (primary) hypertension: Secondary | ICD-10-CM | POA: Diagnosis not present

## 2022-06-20 DIAGNOSIS — R000 Tachycardia, unspecified: Secondary | ICD-10-CM | POA: Diagnosis not present

## 2022-06-20 DIAGNOSIS — R5383 Other fatigue: Secondary | ICD-10-CM

## 2022-06-20 DIAGNOSIS — R0602 Shortness of breath: Secondary | ICD-10-CM

## 2022-06-20 DIAGNOSIS — R739 Hyperglycemia, unspecified: Secondary | ICD-10-CM | POA: Diagnosis not present

## 2022-06-20 DIAGNOSIS — J439 Emphysema, unspecified: Secondary | ICD-10-CM | POA: Diagnosis not present

## 2022-06-20 LAB — COMPREHENSIVE METABOLIC PANEL
ALT: 40 U/L (ref 0–44)
AST: 29 U/L (ref 15–41)
Albumin: 3.8 g/dL (ref 3.5–5.0)
Alkaline Phosphatase: 93 U/L (ref 38–126)
Anion gap: 14 (ref 5–15)
BUN: 29 mg/dL — ABNORMAL HIGH (ref 8–23)
CO2: 16 mmol/L — ABNORMAL LOW (ref 22–32)
Calcium: 9.2 mg/dL (ref 8.9–10.3)
Chloride: 109 mmol/L (ref 98–111)
Creatinine, Ser: 1.72 mg/dL — ABNORMAL HIGH (ref 0.61–1.24)
GFR, Estimated: 38 mL/min — ABNORMAL LOW (ref >60)
Glucose, Bld: 209 mg/dL — ABNORMAL HIGH (ref 70–99)
Potassium: 5 mmol/L (ref 3.5–5.1)
Sodium: 139 mmol/L (ref 135–145)
Total Bilirubin: 0.8 mg/dL (ref 0.3–1.2)
Total Protein: 6.6 g/dL (ref 6.5–8.1)

## 2022-06-20 LAB — POCT URINALYSIS DIPSTICK, ED / UC
Bilirubin Urine: NEGATIVE
Glucose, UA: NEGATIVE mg/dL
Ketones, ur: NEGATIVE mg/dL
Leukocytes,Ua: NEGATIVE
Nitrite: NEGATIVE
Protein, ur: >=300 mg/dL — AB
Specific Gravity, Urine: >=1.03 (ref 1.005–1.030)
Urobilinogen, UA: 0.2 mg/dL (ref 0.0–1.0)
pH: 5.5 (ref 5.0–8.0)

## 2022-06-20 LAB — CBG MONITORING, ED: Glucose-Capillary: 192 mg/dL — ABNORMAL HIGH (ref 70–99)

## 2022-06-20 LAB — CBC
HCT: 38.7 % — ABNORMAL LOW (ref 39.0–52.0)
Hemoglobin: 13.3 g/dL (ref 13.0–17.0)
MCH: 32.5 pg (ref 26.0–34.0)
MCHC: 34.4 g/dL (ref 30.0–36.0)
MCV: 94.6 fL (ref 80.0–100.0)
Platelets: 144 10*3/uL — ABNORMAL LOW (ref 150–400)
RBC: 4.09 MIL/uL — ABNORMAL LOW (ref 4.22–5.81)
RDW: 13.2 % (ref 11.5–15.5)
WBC: 6.2 10*3/uL (ref 4.0–10.5)
nRBC: 0 % (ref 0.0–0.2)

## 2022-06-20 LAB — BRAIN NATRIURETIC PEPTIDE: B Natriuretic Peptide: 1495.1 pg/mL — ABNORMAL HIGH (ref 0.0–100.0)

## 2022-06-20 LAB — HEMOGLOBIN A1C
Hgb A1c MFr Bld: 6.3 % — ABNORMAL HIGH (ref 4.8–5.6)
Mean Plasma Glucose: 134 mg/dL

## 2022-06-20 LAB — TSH: TSH: 1.192 u[IU]/mL (ref 0.350–4.500)

## 2022-06-20 LAB — LIPASE, BLOOD: Lipase: 26 U/L (ref 11–51)

## 2022-06-20 NOTE — Telephone Encounter (Signed)
Reaching out to patient to offer assistance regarding upcoming cardiac imaging study; pt verbalizes understanding of appt date/time, parking situation and where to check in, name and call back number provided for further questions should they arise  Gordy Clement RN Navigator Cardiac [place] [facility] [phone] office [phone] cell  Patient denies metal but could not remember how well he tolerated his MRI he had in October of 2023.

## 2022-06-20 NOTE — Discharge Instructions (Signed)
Your blood pressure was noted to be elevated during your visit today. If you are currently taking medication for high blood pressure, please ensure you are taking this as directed. If you do not have a history of high blood pressure and your blood pressure remains persistently elevated, you may need to begin taking a medication at some point. You may return here within the next few days to recheck if unable to see your primary care provider or if you do not have a one.  BP (!) 171/132 (BP Location: Right Arm)   Pulse (!) 113   Temp 97.6 F (36.4 C) (Oral)   Resp 16   Ht 5\' 7"  (1.702 m)   Wt 54.4 kg   SpO2 94%   BMI 18.79 kg/m   BP Readings from Last 3 Encounters:  06/20/22 (!) 171/132  03/25/22 111/67  03/21/22 125/74   Your blood sugar was also high. I will wait for the lab tests today to compare.  You have had labs (blood tests and urine culture) sent today. We will call you with any significant abnormalities or if there is need to begin or change treatment or pursue further follow up.  You may also review your test results online through [facility]. If you do not have a MyChart account, instructions to sign up should be on your discharge paperwork.

## 2022-06-20 NOTE — ED Triage Notes (Addendum)
Chief Complaint: weakness, body aches. Weakness in the legs. Patient having labored breathing with side discomfort. Patient having a productive cough with clear mucus. Patient having diarrhea. Patient states having right flank pain with burning urination.   Onset: 4 days  Prescriptions or OTC medications tried: No    Sick exposure: No  New foods, medications, or products: No  Recent Travel: No

## 2022-06-20 NOTE — ED Provider Notes (Signed)
[place]   [id number] 06/20/22 Arrival Time: 4332  ASSESSMENT & PLAN:  1. Other fatigue   2. Elevated blood pressure reading in office with diagnosis of hypertension    Reports these symptoms present, fatigue mainly, x 4-5 moths without previous medical evaluation. He seems surprised when I informed him of elevated blood sugar, apparently unaware that he has a dx on his chart of DM. No current medical management. Elevated blood sugars could be contributing to his fatigued and we discussed this. Will await other labs before deciding on tx initiation. Will likely need a blood pressure medication called in also. Known renal insufficiency. With a benign abdomen here today.  ECG: Performed today and interpreted by me: Sinus tachycardia at 110; otherwise without acute changes.  I have personally viewed the imaging studies ordered this visit. CXR: no acute changes; see radiology report for ques bilat fluid  Pending:     Urine Culture   CBC   Comprehensive metabolic panel   TSH   Lipase, blood   Hemoglobin A1c   Brain natriuretic peptide     Discharge Instructions      Your blood pressure was noted to be elevated during your visit today. If you are currently taking medication for high blood pressure, please ensure you are taking this as directed. If you do not have a history of high blood pressure and your blood pressure remains persistently elevated, you may need to begin taking a medication at some point. You may return here within the next few days to recheck if unable to see your primary care provider or if you do not have a one.  BP (!) 171/132 (BP Location: Right Arm)   Pulse (!) 113   Temp 97.6 F (36.4 C) (Oral)   Resp 16   Ht 5\' 7"  (1.702 m)   Wt 54.4 kg   SpO2 94%   BMI 18.79 kg/m   BP Readings from Last 3 Encounters:  06/20/22 (!) 171/132  03/25/22 111/67  03/21/22 125/74   Your blood sugar was also high. I will wait for the lab tests today to  compare.  You have had labs (blood tests and urine culture) sent today. We will call you with any significant abnormalities or if there is need to begin or change treatment or pursue further follow up.  You may also review your test results online through [facility]. If you do not have a MyChart account, instructions to sign up should be on your discharge paperwork.       Reviewed expectations re: course of current medical issues. Questions answered. Outlined signs and symptoms indicating need for more acute intervention. Patient verbalized understanding. After Visit Summary given.   SUBJECTIVE:  History from: patient. Vincent Baxter is a 88 y.o. male. Chief Complaint: weakness, body aches. Fatigue mainly but reports "weakness in the legs". Patient having labored breathing with side discomfort. Patient having a productive cough with clear mucus. Patient having diarrhea. Patient states having right flank pain with burning urination. Unclear of particular duration of symptoms; fatigue x 4-5 months. Sick exposure: No  New foods, medications, or products: No  Recent Travel: No  Denies: chest pain, irregular heart beat, lower extremity edema, near-syncope, orthopnea, palpitations, paroxysmal nocturnal dyspnea, and syncope. No specific aggravating or alleviating factors reported.   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  General appearance: alert, oriented, no acute distress Eyes: PERRLA; EOMI; conjunctivae normal HENT: normocephalic;  atraumatic Neck: supple with FROM Lungs: without labored respirations; speaks full sentences without difficulty; CTAB Heart: regular; tachycardic at approx 100bbpm Chest Wall: without tenderness to palpation Abdomen: soft, non-tender; no guarding or rebound tenderness Extremities: without edema; without calf swelling or tenderness; symmetrical without gross deformities Skin: warm and dry; without rash or lesions Neuro: normal gait Psychological: alert and cooperative; normal mood and affect  Imaging: DG Chest 2 View  Result Date: 06/20/2022 CLINICAL DATA:  Shortness of breath EXAM: CHEST - 2 VIEW COMPARISON:  03/23/2022 FINDINGS: The heart size and mediastinal contours are within normal limits. Diffuse bilateral interstitial pulmonary opacity superimposed upon emphysema. The visualized skeletal structures are unremarkable. IMPRESSION: 1. Diffuse bilateral interstitial pulmonary opacity, consistent with edema or atypical/viral infection. No focal airspace opacity. 2.  Emphysema. Electronically Signed   By: 05/23/2022 M.D.   On: 06/20/2022 11:01     No Known Allergies  Past Medical History:  Diagnosis Date   Diabetes mellitus 01/2009 dx   DYSLIPIDEMIA    GERD    Hypertension    Kidney stones    RENAL CALCULUS 01/04/2009   Social History   Socioeconomic History   Marital status: Widowed    Spouse name: Not on file   Number of children: Not on file   Years of education: Not on file   Highest education level: Not on file  Occupational History   Not on file  Tobacco Use   Smoking status: Every Day    Packs/day: 0.50    Types: Cigarettes   Smokeless tobacco: Never   Tobacco comments:    Retired 01/06/2009, Lives alone, 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  Cabin crew Use: Never used  Substance and Sexual Activity   Alcohol use: No   Drug use: No   Sexual activity: Not on file  Other Topics Concern   Not on file  Social History Narrative   Lives alone.  Retired Building services engineer   Social Determinants  of Cabin crew Strain: Not on Corporate investment banker Insecurity: No Food Insecurity (03/23/2022)   Hunger Vital Sign    Worried About Running Out of Food in the Last Year: Never true    Ran Out of Food in the Last Year: Never true  Transportation Needs: No Transportation Needs (03/23/2022)   PRAPARE - 05/23/2022 (Medical): No    Lack of Transportation (Non-Medical): No  Physical Activity: Not on file  Stress: Not on file  Social Connections: Not on file  Intimate Partner Violence: Not At Risk (03/23/2022)   Humiliation, Afraid, Rape, and Kick questionnaire    Fear of Current or Ex-Partner: No    Emotionally Abused: No    Physically Abused: No    Sexually Abused: No   Family History  Problem Relation Age of Onset   Hypertension Mother    Past Surgical History:  Procedure Laterality Date   NO PAST SURGERIES  Vanessa Kick, MD 06/20/22 1314

## 2022-06-21 ENCOUNTER — Ambulatory Visit (HOSPITAL_COMMUNITY): Admission: RE | Admit: 2022-06-21 | Payer: Medicare Other | Source: Ambulatory Visit

## 2022-06-21 ENCOUNTER — Other Ambulatory Visit: Payer: Self-pay

## 2022-06-21 ENCOUNTER — Emergency Department (HOSPITAL_COMMUNITY): Payer: Medicare Other

## 2022-06-21 ENCOUNTER — Encounter (HOSPITAL_COMMUNITY): Payer: Self-pay

## 2022-06-21 ENCOUNTER — Emergency Department (HOSPITAL_COMMUNITY)
Admission: EM | Admit: 2022-06-21 | Discharge: 2022-06-22 | Disposition: A | Discharge status: Home / Self Care | Payer: Medicare Other | Attending: Emergency Medicine | Admitting: Emergency Medicine

## 2022-06-21 DIAGNOSIS — R109 Unspecified abdominal pain: Secondary | ICD-10-CM | POA: Diagnosis not present

## 2022-06-21 DIAGNOSIS — R1031 Right lower quadrant pain: Secondary | ICD-10-CM | POA: Insufficient documentation

## 2022-06-21 LAB — URINE CULTURE: Culture: <10000 — AB

## 2022-06-21 MED ORDER — KETOROLAC TROMETHAMINE 15 MG/ML IJ SOLN
15.0000 mg | Freq: Once | INTRAMUSCULAR | Status: AC
  Administered 2022-06-21: 15 mg via INTRAMUSCULAR
  Filled 2022-06-21: qty 1

## 2022-06-21 MED ORDER — DICLOFENAC SODIUM 1 % EX GEL: 4.0000 g | Freq: Four times a day (QID) | CUTANEOUS | 0 refills | Status: DC

## 2022-06-21 MED ORDER — SODIUM CHLORIDE 0.9 % IV BOLUS: 1000.0000 mL | Freq: Once | INTRAVENOUS | Status: DC

## 2022-06-21 MED ORDER — IBUPROFEN 200 MG PO TABS
400.0000 mg | ORAL_TABLET | Freq: Once | ORAL | Status: AC | PRN
  Administered 2022-06-21: 400 mg via ORAL
  Filled 2022-06-21: qty 2

## 2022-06-21 NOTE — Discharge Instructions (Addendum)
Your back pain is most likely due to a muscular strain.  There is been a lot of research on back pain, unfortunately the only thing that seems to really help is Tylenol and ibuprofen.  Relative rest is also important to not lift greater than 10 pounds bending or twisting at the waist.  Please follow-up with your family physician.  The other thing that really seems to benefit patients is physical therapy which your doctor may send you for.  Please return to the emergency department for new numbness or weakness to your arms or legs. Difficulty with urinating or urinating or pooping on yourself.  Also if you cannot feel toilet paper when you wipe or get a fever.   Use the gel as prescribed Also take tylenol 1000mg (2 extra strength) four times a day.   Please try and was with a primary care provider.  Please follow-up with vascular surgery and neurosurgery and cardiology.

## 2022-06-21 NOTE — ED Provider Notes (Signed)
[facility]-EMERGENCY DEPT Provider Note   CSN: [id number] Arrival date & time: 06/21/22  1334     History  Chief Complaint  Patient presents with   Flank Pain   Leg Pain   Urinary Frequency   Dysuria    Vincent Baxter is a 88 y.o. male.  88 yo M with a cc of right-sided back pain.  He tells me this is right at the rib margin on the right side.  He has been having issues like this for some time.  He will tell me exactly how long it has been.  He thinks it is worse when he lays back flat.  There is also pain pretty much from the bellybutton down diffusely.  Seems to be there all the time.  Feels like an ache.  Symptoms feels like burning.  Has had some dysuria with this.  Denies trauma.  Denies loss of bowel or bladder and has a subjective sensation.   Flank Pain  Leg Pain Urinary Frequency  Dysuria Presenting symptoms: dysuria   Associated symptoms: flank pain and urinary frequency        Home Medications Prior to Admission medications   Medication Sig Start Date End Date Taking? Authorizing Provider  diclofenac Sodium (VOLTAREN) 1 % GEL Apply 4 g topically 4 (four) times daily. 06/21/22  Yes Melene Plan, DO      Allergies    Patient has no known allergies.    Review of Systems   Review of Systems  Genitourinary:  Positive for dysuria, flank pain and frequency.    Physical Exam Updated Vital Signs BP 134/88 (BP Location: Left Arm)   Pulse 95   Temp 97.6 F (36.4 C) (Oral)   Resp 20   Ht 5\' 7"  (1.702 m)   Wt 54.4 kg   SpO2 99%   BMI 18.79 kg/m  Physical Exam Vitals and nursing note reviewed.  Constitutional:      Appearance: He is well-developed.  HENT:     Head: Normocephalic and atraumatic.  Eyes:     Pupils: Pupils are equal, round, and reactive to light.  Neck:     Vascular: No JVD.  Cardiovascular:     Rate and Rhythm: Normal rate and regular rhythm.     Heart sounds: No murmur heard.    No friction rub. No gallop.   Pulmonary:     Effort: No respiratory distress.     Breath sounds: No wheezing.  Abdominal:     General: There is no distension.     Tenderness: There is no abdominal tenderness. There is no guarding or rebound.  Musculoskeletal:        General: Normal range of motion.     Cervical back: Normal range of motion and neck supple.     Comments: Points to the right CVA as area of discomfort.  No pain on percussion.  No obvious midline spinal tenderness step-offs or deformities.  Intact posterior tibialis pulses bilaterally.  Intact sensation to light touch.  Reflexes are 2+ and equal.  Skin:    Coloration: Skin is not pale.     Findings: No rash.  Neurological:     Mental Status: He is alert and oriented to person, place, and time.  Psychiatric:        Behavior: Behavior normal.     ED Results / Procedures / Treatments   Labs (all labs ordered are listed, but only abnormal results are displayed) Labs Reviewed - No data  to display  EKG None  Radiology CT Renal Stone Study  Result Date: 06/21/2022 CLINICAL DATA:  Flank pain for 3 months. EXAM: CT ABDOMEN AND PELVIS WITHOUT CONTRAST TECHNIQUE: Multidetector CT imaging of the abdomen and pelvis was performed following the standard protocol without IV contrast. RADIATION DOSE REDUCTION: This exam was performed according to the departmental dose-optimization program which includes automated exposure control, adjustment of the mA and/or kV according to patient size and/or use of iterative reconstruction technique. COMPARISON:  March 22, 2022. FINDINGS: Lower chest: Small bilateral pleural effusions are noted with minimal adjacent subsegmental atelectasis. Hepatobiliary: No focal liver abnormality is seen. No gallstones, gallbladder wall thickening, or biliary dilatation. Pancreas: Unremarkable. No pancreatic ductal dilatation or surrounding inflammatory changes. Spleen: Normal in size without focal abnormality. Adrenals/Urinary Tract: Adrenal  glands appear normal. Right renal atrophy is noted. No hydronephrosis or renal obstruction is noted. Urinary bladder is unremarkable. Stomach/Bowel: Stomach is unremarkable. There is no evidence of bowel obstruction or inflammation. Vascular/Lymphatic: 4 cm infrarenal abdominal aortic aneurysm is noted. No definite adenopathy is noted. Reproductive: Prostate is unremarkable. Other: No abdominal wall hernia or abnormality. No abdominopelvic ascites. Musculoskeletal: No acute or significant osseous findings. IMPRESSION: Small bilateral pleural effusions are noted with minimal adjacent subsegmental atelectasis. Right renal atrophy is noted. 4 cm infrarenal abdominal aortic aneurysm. Recommend follow-up every 12 months and vascular consultation. This recommendation follows ACR consensus guidelines: White Paper of the ACR Incidental Findings Committee II on Vascular Findings. J Am Coll Radiol 2013; 10:789-794. Aortic Atherosclerosis (ICD10-I70.0). Electronically Signed   By: Marijo Conception M.D.   On: 06/21/2022 16:21   DG Chest 2 View  Result Date: 06/20/2022 CLINICAL DATA:  Shortness of breath EXAM: CHEST - 2 VIEW COMPARISON:  03/23/2022 FINDINGS: The heart size and mediastinal contours are within normal limits. Diffuse bilateral interstitial pulmonary opacity superimposed upon emphysema. The visualized skeletal structures are unremarkable. IMPRESSION: 1. Diffuse bilateral interstitial pulmonary opacity, consistent with edema or atypical/viral infection. No focal airspace opacity. 2.  Emphysema. Electronically Signed   By: Delanna Ahmadi M.D.   On: 06/20/2022 11:01    Procedures Procedures    Medications Ordered in ED Medications  ibuprofen (ADVIL) tablet 400 mg (400 mg Oral Given 06/21/22 2029)  ketorolac (TORADOL) 15 MG/ML injection 15 mg (15 mg Intramuscular Given 06/21/22 2235)    ED Course/ Medical Decision Making/ A&P                           Medical Decision Making Risk OTC drugs. Prescription  drug management.   88 yo M with a chief complaints of right flank pain.  He tells me that this been going on for a while.  He tells me that he needs this fixed because continues to happen to him and he does not want to live in pain.  He does not like taking pain medicine.  He was given 400 mg of ibuprofen in triage and tells me he feels completely better.  He has been having some urinary symptoms with this as well.  He thinks it could be kidney stones had those some his remote history.  I had ordered blood work on the patient and then when I reviewed his chart I realized that he actually was seen in urgent care yesterday and had a full panel of labs.  From urgent care it sounds like they have been trying to get him to follow-up with a primary care provider as  well as a vascular surgeon and cardiologist.  On my record review he also was recently admitted to the hospital had an MRI of his L-spine had CT imaging performed.  He has a known 4.1 cm inferior aortic aneurysm.  No significant change on imaging today.  No kidney stones.  No obvious urinary tract infection based on urine results yesterday.  I discussed results with the patient.  Will try a different home remedy for him.  Will try diclofenac gel and Tylenol.  He was given follow-up information for vascular surgery cardiology encouraged to follow-up with [facility] as needed follow-up with them in the past.  10:45 PM:  I have discussed the diagnosis/risks/treatment options with the patient.  Evaluation and diagnostic testing in the emergency department does not suggest an emergent condition requiring admission or immediate intervention beyond what has been performed at this time.  They will follow up with PCP. We also discussed returning to the ED immediately if new or worsening sx occur. We discussed the sx which are most concerning (e.g., sudden worsening pain, fever, inability to tolerate by mouth, cauda equina s/sx) that necessitate immediate  return. Medications administered to the patient during their visit and any new prescriptions provided to the patient are listed below.  Medications given during this visit Medications  ibuprofen (ADVIL) tablet 400 mg (400 mg Oral Given 06/21/22 2029)  ketorolac (TORADOL) 15 MG/ML injection 15 mg (15 mg Intramuscular Given 06/21/22 2235)     The patient appears reasonably screen and/or stabilized for discharge and I doubt any other medical condition or other [facility] requiring further screening, evaluation, or treatment in the ED at this time prior to discharge.          Final Clinical Impression(s) / ED Diagnoses Final diagnoses:  Right flank pain    Rx / DC Orders ED Discharge Orders          Ordered    Ambulatory referral to Cardiology       Comments: If you have not heard from the Cardiology office within the next 72 hours please call [phone].   06/21/22 2231    diclofenac Sodium (VOLTAREN) 1 % GEL  4 times daily        06/21/22 2231              Deno Etienne, DO 06/21/22 2245

## 2022-06-21 NOTE — ED Provider Triage Note (Signed)
Emergency Medicine Provider Triage Evaluation Note  Vincent Baxter , a 88 y.o. male  was evaluated in triage.  Patient complaining of multiple things.  Says that he has been in and out of the hospital multiple times and nobody does anything further.  In his sides, legs, knees and feet.   Patient had lab work done yesterday and says that he does not want any more.  Also a chest x-ray.  I looked at this and there was questionable atypical/viral infection.  While in the room patient coughed some pink sputum.  He says "you must take this down to the lab."  He was made aware that this is the triage area and any further workup will be decided by providers in the back of the department  Review of Systems  Positive:  Negative:   Physical Exam  BP (!) 144/101 (BP Location: Left Arm)   Pulse 89   Temp 98.9 F (37.2 C) (Oral)   Resp 18   Ht 5\' 7"  (1.702 m)   Wt 54.4 kg   SpO2 98%   BMI 18.79 kg/m  Gen:   Awake, no distress   Resp:  Normal effort  MSK:   Moves extremities without difficulty  Other:    Medical Decision Making  Medically screening exam initiated at 3:00 PM.  Appropriate orders placed.  RAEGAN SIPP was informed that the remainder of the evaluation will be completed by another provider, this initial triage assessment does not replace that evaluation, and the importance of remaining in the ED until their evaluation is complete.    Will do CT renal, history of kidney stones and patient complaining of flank pain, worse on the right.   Rhae Hammock, PA-C 06/21/22 1501

## 2022-06-21 NOTE — ED Triage Notes (Addendum)
Patient c/o right flank pain, urinary frequency and dysuria. x 2 days. Patient reports a history of kidney stones.  Patient also c/o bilateral leg pain and worsens when he walks.

## 2022-06-27 ENCOUNTER — Telehealth: Payer: Self-pay

## 2022-06-27 NOTE — Telephone Encounter (Signed)
        Patient  visited [facility] on 1/5  Telephone encounter attempt :  1st  A HIPAA compliant voice message was left requesting a return call.  Instructed patient to call back .    [place], Care Management  [phone] [address], [place], [place] [address] Phone: [phone] Email: [email or web address]

## 2022-06-28 ENCOUNTER — Telehealth: Payer: Self-pay

## 2022-06-28 NOTE — Telephone Encounter (Signed)
     Patient  visit on 1/5  at [facility]   Patient has not followed up with a PCP because he does not have one. I have mailed a list of PCPs in his area. Patient didnt answer about his medications.    Have you been able to follow up with your primary care physician? No   The patient was or was not able to obtain any needed medicine or equipment. NA   Are there diet recommendations that you are having difficulty following? NA   Patient expresses understanding of discharge instructions and education provided has no other needs at this time.  [place], [facility], Care Management  [phone] [address], [place], [place] [address] Phone: [phone] Email: [email or web address]

## 2022-12-20 ENCOUNTER — Other Ambulatory Visit: Payer: Self-pay

## 2022-12-20 ENCOUNTER — Encounter (HOSPITAL_COMMUNITY): Payer: Self-pay

## 2022-12-20 ENCOUNTER — Emergency Department (HOSPITAL_COMMUNITY)
Admission: EM | Admit: 2022-12-20 | Discharge: 2022-12-20 | Disposition: A | Discharge status: Home / Self Care | Payer: Medicare Other | Attending: Emergency Medicine | Admitting: Emergency Medicine

## 2022-12-20 ENCOUNTER — Emergency Department (HOSPITAL_COMMUNITY): Payer: Medicare Other

## 2022-12-20 DIAGNOSIS — R109 Unspecified abdominal pain: Secondary | ICD-10-CM | POA: Insufficient documentation

## 2022-12-20 DIAGNOSIS — N189 Chronic kidney disease, unspecified: Secondary | ICD-10-CM | POA: Diagnosis not present

## 2022-12-20 DIAGNOSIS — J439 Emphysema, unspecified: Secondary | ICD-10-CM | POA: Insufficient documentation

## 2022-12-20 DIAGNOSIS — N200 Calculus of kidney: Secondary | ICD-10-CM | POA: Diagnosis not present

## 2022-12-20 DIAGNOSIS — I10 Essential (primary) hypertension: Secondary | ICD-10-CM

## 2022-12-20 DIAGNOSIS — I7143 Infrarenal abdominal aortic aneurysm, without rupture: Secondary | ICD-10-CM | POA: Diagnosis not present

## 2022-12-20 DIAGNOSIS — R531 Weakness: Secondary | ICD-10-CM | POA: Diagnosis not present

## 2022-12-20 DIAGNOSIS — M2550 Pain in unspecified joint: Secondary | ICD-10-CM | POA: Diagnosis not present

## 2022-12-20 DIAGNOSIS — R0781 Pleurodynia: Secondary | ICD-10-CM | POA: Diagnosis not present

## 2022-12-20 DIAGNOSIS — I129 Hypertensive chronic kidney disease with stage 1 through stage 4 chronic kidney disease, or unspecified chronic kidney disease: Secondary | ICD-10-CM | POA: Diagnosis not present

## 2022-12-20 DIAGNOSIS — Z1152 Encounter for screening for COVID-19: Secondary | ICD-10-CM | POA: Diagnosis not present

## 2022-12-20 DIAGNOSIS — R7989 Other specified abnormal findings of blood chemistry: Secondary | ICD-10-CM | POA: Diagnosis not present

## 2022-12-20 DIAGNOSIS — I700 Atherosclerosis of aorta: Secondary | ICD-10-CM | POA: Diagnosis not present

## 2022-12-20 LAB — URINALYSIS, ROUTINE W REFLEX MICROSCOPIC
Bacteria, UA: NONE SEEN
Bilirubin Urine: NEGATIVE
Glucose, UA: NEGATIVE mg/dL
Hgb urine dipstick: NEGATIVE
Ketones, ur: 5 mg/dL — AB
Leukocytes,Ua: NEGATIVE
Nitrite: NEGATIVE
Protein, ur: NEGATIVE mg/dL
Specific Gravity, Urine: 1.01 (ref 1.005–1.030)
pH: 5 (ref 5.0–8.0)

## 2022-12-20 LAB — COMPREHENSIVE METABOLIC PANEL
ALT: 14 U/L (ref 0–44)
AST: 16 U/L (ref 15–41)
Albumin: 3.8 g/dL (ref 3.5–5.0)
Alkaline Phosphatase: 82 U/L (ref 38–126)
Anion gap: 10 (ref 5–15)
BUN: 59 mg/dL — ABNORMAL HIGH (ref 8–23)
CO2: 16 mmol/L — ABNORMAL LOW (ref 22–32)
Calcium: 9.1 mg/dL (ref 8.9–10.3)
Chloride: 111 mmol/L (ref 98–111)
Creatinine, Ser: 2.08 mg/dL — ABNORMAL HIGH (ref 0.61–1.24)
GFR, Estimated: 31 mL/min — ABNORMAL LOW (ref >60)
Glucose, Bld: 169 mg/dL — ABNORMAL HIGH (ref 70–99)
Potassium: 5.4 mmol/L — ABNORMAL HIGH (ref 3.5–5.1)
Sodium: 137 mmol/L (ref 135–145)
Total Bilirubin: 1 mg/dL (ref 0.3–1.2)
Total Protein: 7 g/dL (ref 6.5–8.1)

## 2022-12-20 LAB — CBC
HCT: 39.4 % (ref 39.0–52.0)
Hemoglobin: 13.4 g/dL (ref 13.0–17.0)
MCH: 32.8 pg (ref 26.0–34.0)
MCHC: 34 g/dL (ref 30.0–36.0)
MCV: 96.6 fL (ref 80.0–100.0)
Platelets: 169 10*3/uL (ref 150–400)
RBC: 4.08 MIL/uL — ABNORMAL LOW (ref 4.22–5.81)
RDW: 14 % (ref 11.5–15.5)
WBC: 6.7 10*3/uL (ref 4.0–10.5)
nRBC: 0 % (ref 0.0–0.2)

## 2022-12-20 LAB — LIPASE, BLOOD: Lipase: 33 U/L (ref 11–51)

## 2022-12-20 LAB — SARS CORONAVIRUS 2 BY RT PCR: SARS Coronavirus 2 by RT PCR: NEGATIVE

## 2022-12-20 MED ORDER — HYDROCODONE-ACETAMINOPHEN 5-325 MG PO TABS
1.0000 | ORAL_TABLET | Freq: Once | ORAL | Status: AC
  Administered 2022-12-20: 1 via ORAL
  Filled 2022-12-20: qty 1

## 2022-12-20 MED ORDER — SODIUM CHLORIDE 0.9 % IV BOLUS (SEPSIS)
500.0000 mL | Freq: Once | INTRAVENOUS | Status: AC
  Administered 2022-12-20: 500 mL via INTRAVENOUS

## 2022-12-20 MED ORDER — SODIUM CHLORIDE 0.9 % IV SOLN
1000.0000 mL | INTRAVENOUS | Status: DC
  Administered 2022-12-20: 1000 mL via INTRAVENOUS

## 2022-12-20 MED ORDER — LIDOCAINE 5 % EX PTCH
1.0000 | MEDICATED_PATCH | CUTANEOUS | 0 refills | Status: DC
  Filled 2022-12-20: qty 30, 30d supply, fill #0

## 2022-12-20 MED ORDER — ACETAMINOPHEN 500 MG PO TABS
500.0000 mg | ORAL_TABLET | Freq: Four times a day (QID) | ORAL | 0 refills | Status: DC | PRN
  Filled 2022-12-20: qty 30, 8d supply, fill #0

## 2022-12-20 NOTE — Discharge Instructions (Addendum)
Take the pain patches to see if that helps with the stiffness in your lower extremities.  Follow up with a primary care doctor.  I have asked our social work team to call you during the day time to try and help arrange that.

## 2022-12-20 NOTE — ED Triage Notes (Signed)
Pt presents with generalized weakness x 1 week. Pt reports associated decreased appetite. Pt also c/o pain to R flank. Pt denies N/V/D.

## 2022-12-20 NOTE — ED Notes (Signed)
Urinal at bedside.  

## 2022-12-20 NOTE — ED Notes (Signed)
Pt was able to ambulate w/ stand by assist.  As he was walking he stated that he has not eaten in a few days.  RN inquired and pt stated I just don't feel like making food.  RN also discussed the importance of finding a primary doctor.  Pt agreed but stated he did not know where to start.  RN informed provider.

## 2022-12-20 NOTE — ED Notes (Signed)
RN again spoke to pt about the importance of finding a primary Dr. To which he full agreed.  He will answer the phone when social worker calls.  RN also explained where the pharmacy is so that pt can get meds.    Pt asked if he could take the sandwich home.  RN believes the he struggles to eat at home.  RN suggested that there were programs like Meals on Wheels and pt stated that would be a huge help.    Pt thanked Charity fundraiser. He appears gruff on the surface however RN believes he does not like asking for assistance.

## 2022-12-20 NOTE — ED Provider Notes (Signed)
[place] EMERGENCY DEPARTMENT AT [facility] Provider Note   CSN: [id number] Arrival date & time: 12/20/22  1440     History  Chief Complaint  Patient presents with   Weakness    Vincent Baxter is a 88 y.o. male.   Weakness    Patient has a history of hypertension back pain and fatigue.  Patient states he does not have a primary care doctor.  He has been's having symptoms for about a week now.  Patient states he feels fatigued primarily from his waist down to his lower legs.  He gets tired with walking.  He feels a tender spot on his posterior right lower ribs.  Patient states he has not eaten anything in the last couple of days.  He has access to food but it does not taste good.  He denies any falls.  He denies any fevers or chills.  He denies any chest pain or shortness of breath.  Home Medications Prior to Admission medications   Medication Sig Start Date End Date Taking? Authorizing Provider  acetaminophen (TYLENOL) 500 MG tablet Take 1 tablet (500 mg total) by mouth every 6 (six) hours as needed. 12/20/22  Yes Linwood Dibbles, MD  lidocaine (LIDODERM) 5 % Place 1 patch onto the skin daily. Remove & Discard patch within 12 hours or as directed by MD 12/20/22  Yes Linwood Dibbles, MD  diclofenac Sodium (VOLTAREN) 1 % GEL Apply 4 g topically 4 (four) times daily. Patient not taking: Reported on 12/20/2022 06/21/22   Melene Plan, DO      Allergies    Patient has no known allergies.    Review of Systems   Review of Systems  Neurological:  Positive for weakness.    Physical Exam Updated Vital Signs BP (!) 152/108 (BP Location: Right Arm)   Pulse 84   Temp 97.6 F (36.4 C)   Resp 14   Ht 1.676 m (5\' 6" )   Wt 43.1 kg   SpO2 97%   BMI 15.33 kg/m  Physical Exam Vitals and nursing note reviewed.  Constitutional:      Appearance: He is well-developed. He is not diaphoretic.     Comments: Underweight  HENT:     Head: Normocephalic and atraumatic.     Right Ear: External  ear normal.     Left Ear: External ear normal.  Eyes:     General: No scleral icterus.       Right eye: No discharge.        Left eye: No discharge.     Conjunctiva/sclera: Conjunctivae normal.  Neck:     Trachea: No tracheal deviation.  Cardiovascular:     Rate and Rhythm: Normal rate and regular rhythm.  Pulmonary:     Effort: Pulmonary effort is normal. No respiratory distress.     Breath sounds: Normal breath sounds. No stridor. No wheezing or rales.  Chest:     Comments: Mild tenderness palpation inferior posterior rib, no deformity Abdominal:     General: Bowel sounds are normal. There is no distension.     Palpations: Abdomen is soft.     Tenderness: There is no abdominal tenderness. There is no guarding or rebound.  Musculoskeletal:        General: No tenderness or deformity.     Cervical back: Neck supple.  Skin:    General: Skin is warm and dry.     Findings: No rash.  Neurological:     General: No focal  deficit present.     Mental Status: He is alert.     Cranial Nerves: No cranial nerve deficit, dysarthria or facial asymmetry.     Sensory: No sensory deficit.     Motor: No abnormal muscle tone or seizure activity.     Coordination: Coordination normal.  Psychiatric:        Mood and Affect: Mood normal.     ED Results / Procedures / Treatments   Labs (all labs ordered are listed, but only abnormal results are displayed) Labs Reviewed  CBC - Abnormal; Notable for the following components:      Result Value   RBC 4.08 (*)    All other components within normal limits  URINALYSIS, ROUTINE W REFLEX MICROSCOPIC - Abnormal; Notable for the following components:   Color, Urine STRAW (*)    Ketones, ur 5 (*)    All other components within normal limits  COMPREHENSIVE METABOLIC PANEL - Abnormal; Notable for the following components:   Potassium 5.4 (*)    CO2 16 (*)    Glucose, Bld 169 (*)    BUN 59 (*)    Creatinine, Ser 2.08 (*)    GFR, Estimated 31 (*)     All other components within normal limits  SARS CORONAVIRUS 2 BY RT PCR  LIPASE, BLOOD    EKG EKG Interpretation Date/Time:  Thursday December 20 2022 15:03:13 EDT Ventricular Rate:  91 PR Interval:  181 QRS Duration:  90 QT Interval:  367 QTC Calculation: 452 R Axis:   -62  Text Interpretation: Sinus rhythm Left anterior fascicular block Low voltage, extremity leads No significant change since last tracing Confirmed by Linwood Dibbles [phone]) on 12/20/2022 3:26:42 PM  Radiology CT Renal Stone Study  Result Date: 12/20/2022 CLINICAL DATA:  Generalized weakness for 1 week.  Right flank pain EXAM: CT ABDOMEN AND PELVIS WITHOUT CONTRAST TECHNIQUE: Multidetector CT imaging of the abdomen and pelvis was performed following the standard protocol without IV contrast. RADIATION DOSE REDUCTION: This exam was performed according to the departmental dose-optimization program which includes automated exposure control, adjustment of the mA and/or kV according to patient size and/or use of iterative reconstruction technique. COMPARISON:  CT 06/21/2022 FINDINGS: Lower chest: Bronchial wall thickening with bronchiectasis/bronchiolectasis in the lower lobes. Posteromedial lower lobe airspace opacities favored to represent atelectasis. Hepatobiliary: Unremarkable noncontrast appearance of the liver, gallbladder, and biliary tree. Pancreas: Unremarkable. Spleen: Unremarkable. Adrenals/Urinary Tract: Stable adrenal glands. Nonobstructing stones in the lower pole of the right kidney. No definite ureteral calculi. No hydronephrosis. Unremarkable bladder. Stomach/Bowel: Normal caliber large and small bowel. Evaluation of the bowel wall is limited by paucity of intra-abdominal fat and lack of IV contrast. Vascular/Lymphatic: Aortic atherosclerotic calcification. Infrarenal abdominal aortic aneurysm measuring up to 44 mm in maximum diameter. No definite lymphadenopathy though sensitivity is decreased by paucity of intra-abdominal  fat and lack of IV contrast. Reproductive: Unremarkable. Other: No free intraperitoneal air. Musculoskeletal: No acute osseous abnormality. IMPRESSION: 1. Nonobstructing stones in the lower pole of the right kidney. No definite ureteral calculi. No hydronephrosis. 2. Infrarenal abdominal aortic aneurysm measuring up to 44 mm in maximum diameter. Recommend follow-up every 12 months and vascular consultation. This recommendation follows ACR consensus guidelines: White Paper of the ACR Incidental Findings Committee II on Vascular Findings. J Am Coll Radiol 2013; 10:789-794. 3. Bilateral lower lobe presumed atelectasis. Pneumonia is difficult to exclude. Aortic Atherosclerosis (ICD10-I70.0). Electronically Signed   By: Minerva Fester M.D.   On: 12/20/2022 18:44   DG  Chest 2 View  Result Date: 12/20/2022 CLINICAL DATA:  Right lower rib pain EXAM: CHEST - 2 VIEW COMPARISON:  06/20/2022 FINDINGS: Hyperinflation with emphysema. No acute airspace disease, pleural effusion, or pneumothorax. Stable cardiomediastinal silhouette with aortic atherosclerosis. No acute osseous abnormality. IMPRESSION: Hyperinflation with emphysema. Electronically Signed   By: Jasmine Pang M.D.   On: 12/20/2022 15:48    Procedures Procedures    Medications Ordered in ED Medications  sodium chloride 0.9 % bolus 500 mL (0 mLs Intravenous Stopped 12/20/22 1742)    Followed by  0.9 %  sodium chloride infusion (0 mLs Intravenous Stopped 12/20/22 1742)  HYDROcodone-acetaminophen (NORCO/VICODIN) 5-325 MG per tablet 1 tablet (1 tablet Oral Given 12/20/22 2129)    ED Course/ Medical Decision Making/ A&P Clinical Course as of 12/20/22 2218  Thu Dec 20, 2022  1750 SARS Coronavirus 2 by RT PCR (hospital order, performed in [facility] hospital lab) *cepheid single result test* Anterior Nasal Swab COVID-negative lipase normal [JK]  1750 Comprehensive metabolic panel(!) Creatinine elevated.  Bicarb decreased.  Similar to previous although trend  is worsening [JK]  1750 CBC(!) Normal [JK]  1750 Xray shows emphysema [JK]  1844 COVID-negative [JK]  2028 Urinalysis without signs of infection [JK]  2154 Patient was able to ambulate in the ED. [JK]    Clinical Course User Index [JK] Linwood Dibbles, MD                             Medical Decision Making Problems Addressed: Arthralgia, unspecified joint: chronic illness or injury with exacerbation, progression, or side effects of treatment Flank pain: chronic illness or injury with exacerbation, progression, or side effects of treatment Weakness: acute illness or injury that poses a threat to life or bodily functions  Amount and/or Complexity of Data Reviewed Labs: ordered. Decision-making details documented in ED Course. Radiology: ordered and independent interpretation performed.  Risk OTC drugs. Prescription drug management.  Prior records reviewed.  Patient had admission to the hospital back in October of last year.  He was evaluated for back pain associated with a nodule on his right lower rib.  At that time it had been ongoing for at least 6 months.  Patient also mentioned lower ankle stiffness and trouble ambulating at times.  During the evaluation patient was noted to have bradycardia and was admitted for that.  Patient had imaging test that showed evidence of abdominal aortic aneurysm.  He had also had MRI of the L-spine that showed facet arthritis.  Patient also noted to have a questionable 1.7 cm renal mass.  He also had incidental abdominal aortic aneurysm.  Patient was seen back in the emergency room on January 2024 with a similar presentation.  Patient's aneurysm appeared stable.  ED workup today is reassuring.  Patient does have worsening creatinine, bicarb, mild hyperkalemia..  There could be a component of dehydration although he has not had any vomiting or diarrhea.  I suspect this could be related to his untreated hypertension.  Patient CT scan does not show any acute  process.  No signs of acute infection.  No signs of any acute neurologic dysfunction.  Patient does not have a primary care doctor and I do think he would benefit from that.  He was able to ambulate around the ED, I do not think he requires hospitalization.  Will start him on Tylenol and lidocaine pain patches for his arthralgias.  Stressed the importance of having follow-up with  a primary care doctor.  I will place a referral to [facility] to see if they can contact him tomorrow to assist him with following up with a primary care doctor.        Final Clinical Impression(s) / ED Diagnoses Final diagnoses:  Flank pain  Weakness  Arthralgia, unspecified joint  Hypertension, unspecified type  Chronic kidney disease, unspecified CKD stage    Rx / DC Orders ED Discharge Orders          Ordered    lidocaine (LIDODERM) 5 %  Every 24 hours        12/20/22 2215    acetaminophen (TYLENOL) 500 MG tablet  Every 6 hours PRN        12/20/22 2215              Linwood Dibbles, MD 12/20/22 2218

## 2022-12-21 ENCOUNTER — Other Ambulatory Visit: Payer: Self-pay

## 2022-12-21 ENCOUNTER — Other Ambulatory Visit (HOSPITAL_COMMUNITY): Payer: Self-pay

## 2022-12-22 ENCOUNTER — Emergency Department (HOSPITAL_COMMUNITY)
Admission: EM | Admit: 2022-12-22 | Discharge: 2022-12-22 | Disposition: A | Discharge status: Home / Self Care | Payer: Medicare Other | Attending: Emergency Medicine | Admitting: Emergency Medicine

## 2022-12-22 DIAGNOSIS — I509 Heart failure, unspecified: Secondary | ICD-10-CM | POA: Insufficient documentation

## 2022-12-22 DIAGNOSIS — E875 Hyperkalemia: Secondary | ICD-10-CM | POA: Diagnosis not present

## 2022-12-22 DIAGNOSIS — E119 Type 2 diabetes mellitus without complications: Secondary | ICD-10-CM | POA: Diagnosis not present

## 2022-12-22 DIAGNOSIS — D696 Thrombocytopenia, unspecified: Secondary | ICD-10-CM | POA: Diagnosis not present

## 2022-12-22 DIAGNOSIS — J449 Chronic obstructive pulmonary disease, unspecified: Secondary | ICD-10-CM | POA: Insufficient documentation

## 2022-12-22 DIAGNOSIS — R531 Weakness: Secondary | ICD-10-CM | POA: Diagnosis not present

## 2022-12-22 LAB — BASIC METABOLIC PANEL
Anion gap: 8 (ref 5–15)
BUN: 51 mg/dL — ABNORMAL HIGH (ref 8–23)
CO2: 18 mmol/L — ABNORMAL LOW (ref 22–32)
Calcium: 9.2 mg/dL (ref 8.9–10.3)
Chloride: 109 mmol/L (ref 98–111)
Creatinine, Ser: 1.82 mg/dL — ABNORMAL HIGH (ref 0.61–1.24)
GFR, Estimated: 36 mL/min — ABNORMAL LOW (ref >60)
Glucose, Bld: 160 mg/dL — ABNORMAL HIGH (ref 70–99)
Potassium: 5.3 mmol/L — ABNORMAL HIGH (ref 3.5–5.1)
Sodium: 135 mmol/L (ref 135–145)

## 2022-12-22 LAB — CBC
HCT: 38.3 % — ABNORMAL LOW (ref 39.0–52.0)
Hemoglobin: 13.3 g/dL (ref 13.0–17.0)
MCH: 32.8 pg (ref 26.0–34.0)
MCHC: 34.7 g/dL (ref 30.0–36.0)
MCV: 94.6 fL (ref 80.0–100.0)
Platelets: 143 10*3/uL — ABNORMAL LOW (ref 150–400)
RBC: 4.05 MIL/uL — ABNORMAL LOW (ref 4.22–5.81)
RDW: 13.7 % (ref 11.5–15.5)
WBC: 5.4 10*3/uL (ref 4.0–10.5)
nRBC: 0 % (ref 0.0–0.2)

## 2022-12-22 LAB — URINALYSIS, ROUTINE W REFLEX MICROSCOPIC
Bilirubin Urine: NEGATIVE
Glucose, UA: NEGATIVE mg/dL
Hgb urine dipstick: NEGATIVE
Ketones, ur: NEGATIVE mg/dL
Leukocytes,Ua: NEGATIVE
Nitrite: NEGATIVE
Protein, ur: NEGATIVE mg/dL
Specific Gravity, Urine: 1.011 (ref 1.005–1.030)
pH: 5 (ref 5.0–8.0)

## 2022-12-22 LAB — HEPATIC FUNCTION PANEL
ALT: 16 U/L (ref 0–44)
AST: 18 U/L (ref 15–41)
Albumin: 3.6 g/dL (ref 3.5–5.0)
Alkaline Phosphatase: 82 U/L (ref 38–126)
Bilirubin, Direct: 0.1 mg/dL (ref 0.0–0.2)
Indirect Bilirubin: 0.4 mg/dL (ref 0.3–0.9)
Total Bilirubin: 0.5 mg/dL (ref 0.3–1.2)
Total Protein: 7 g/dL (ref 6.5–8.1)

## 2022-12-22 LAB — CBG MONITORING, ED: Glucose-Capillary: 174 mg/dL — ABNORMAL HIGH (ref 70–99)

## 2022-12-22 MED ORDER — SODIUM ZIRCONIUM CYCLOSILICATE 10 G PO PACK
10.0000 g | PACK | Freq: Once | ORAL | Status: AC
  Administered 2022-12-22: 10 g via ORAL
  Filled 2022-12-22: qty 1

## 2022-12-22 NOTE — Discharge Instructions (Signed)
You were seen in the emergency department for your weakness.  You had no signs of severe dehydration or infection and no signs of stroke.  Your potassium level is mildly low and we gave you medication to help bring this back down.  You should be receiving a call from social work to help set up home PT and home health to help work on your strength and I have given you a primary care clinic that you should call to schedule a follow-up appointment so you can have reassessment of your symptoms and further investigation of what is causing the weakness.  You should return to the emergency department if you are too weak to get out of bed, you fall and injure yourself, you have numbness or weakness on one side of her body compared to the other or if you have any other new or concerning symptoms.

## 2022-12-22 NOTE — ED Triage Notes (Signed)
Patient reports he has been weaker for the past two weeks Says he was recently at hospital and was discharged home no better Denies pain in triage.

## 2022-12-22 NOTE — ED Provider Notes (Signed)
[place] EMERGENCY DEPARTMENT AT [facility] Provider Note   CSN: [id number] Arrival date & time: 12/22/22  1400     History  Chief Complaint  Patient presents with   Weakness    Vincent Baxter is a 88 y.o. male.  Patient is an 88 year old male with a past medical history of COPD, CHF, diabetes, AAA not on any home medications presenting to the emergency department for generalized weakness.  Patient states that he has had worsening weakness "for a while" and reports that he is getting to a point where he is having difficulty getting out of bed and going to the bathroom.  He states that he feels weak all over.  He denies any associated pain except for chronic right-sided rib pain.  He denies any numbness.  He denies any recent fevers, nausea, vomiting, diarrhea or constipation, black or bloody stools.  He states that he does have chronic urinary frequency but denies any dysuria or hematuria.  The history is provided by the patient.  Weakness      Home Medications Prior to Admission medications   Medication Sig Start Date End Date Taking? Authorizing Provider  acetaminophen (TYLENOL) 500 MG tablet Take 1 tablet (500 mg total) by mouth every 6 (six) hours as needed. 12/20/22   Linwood Dibbles, MD  diclofenac Sodium (VOLTAREN) 1 % GEL Apply 4 g topically 4 (four) times daily. Patient not taking: Reported on 12/20/2022 06/21/22   Melene Plan, DO  lidocaine (LIDODERM) 5 % Place 1 patch onto the skin daily. Remove & Discard patch within 12 hours or as directed by MD 12/20/22   Linwood Dibbles, MD      Allergies    Patient has no known allergies.    Review of Systems   Review of Systems  Neurological:  Positive for weakness.    Physical Exam Updated Vital Signs BP 121/87   Pulse 85   Temp 98.3 F (36.8 C) (Oral)   Resp (!) 21   Ht 5\' 6"  (1.676 m)   Wt 43.1 kg   SpO2 98%   BMI 15.33 kg/m  Physical Exam Vitals and nursing note reviewed.  Constitutional:      General: He is not  in acute distress.    Comments: Frail and cachectic appearing  HENT:     Head: Normocephalic and atraumatic.     Nose: Nose normal.     Mouth/Throat:     Mouth: Mucous membranes are moist.     Pharynx: Oropharynx is clear.  Eyes:     Extraocular Movements: Extraocular movements intact.     Conjunctiva/sclera: Conjunctivae normal.     Pupils: Pupils are equal, round, and reactive to light.  Cardiovascular:     Rate and Rhythm: Normal rate and regular rhythm.     Heart sounds: Normal heart sounds.  Pulmonary:     Effort: Pulmonary effort is normal.     Breath sounds: Normal breath sounds.  Abdominal:     General: Abdomen is flat.     Palpations: Abdomen is soft.     Tenderness: There is no abdominal tenderness.  Musculoskeletal:        General: Normal range of motion.     Cervical back: Normal range of motion.  Skin:    General: Skin is warm and dry.  Neurological:     General: No focal deficit present.     Mental Status: He is alert and oriented to person, place, and time.  Cranial Nerves: No cranial nerve deficit.     Sensory: No sensory deficit.     Motor: No weakness (5 out of 5 strength in all 4 extremities).  Psychiatric:        Mood and Affect: Mood normal.        Behavior: Behavior normal.     ED Results / Procedures / Treatments   Labs (all labs ordered are listed, but only abnormal results are displayed) Labs Reviewed  BASIC METABOLIC PANEL - Abnormal; Notable for the following components:      Result Value   Potassium 5.3 (*)    CO2 18 (*)    Glucose, Bld 160 (*)    BUN 51 (*)    Creatinine, Ser 1.82 (*)    GFR, Estimated 36 (*)    All other components within normal limits  CBC - Abnormal; Notable for the following components:   RBC 4.05 (*)    HCT 38.3 (*)    Platelets 143 (*)    All other components within normal limits  URINALYSIS, ROUTINE W REFLEX MICROSCOPIC - Abnormal; Notable for the following components:   APPearance HAZY (*)    All other  components within normal limits  CBG MONITORING, ED - Abnormal; Notable for the following components:   Glucose-Capillary 174 (*)    All other components within normal limits  HEPATIC FUNCTION PANEL  CBG MONITORING, ED    EKG EKG Interpretation Date/Time:  Saturday December 22 2022 14:17:47 EDT Ventricular Rate:  94 PR Interval:  179 QRS Duration:  93 QT Interval:  348 QTC Calculation: 436 R Axis:   263  Text Interpretation: Sinus rhythm Right superior axis Consider anterior infarct No significant change since last tracing Confirmed by Elayne Snare ([id number]) on 12/22/2022 2:39:53 PM  Radiology No results found.  Procedures Procedures    Medications Ordered in ED Medications  sodium zirconium cyclosilicate (LOKELMA) packet 10 g (10 g Oral Given by Other 12/22/22 1716)    ED Course/ Medical Decision Making/ A&P Clinical Course as of 12/22/22 2008  Sat Dec 22, 2022  1632 Cr improved, mildly elevated potassium without hyperkalemic changes. Will be given lokelma. Mild thrombocytopenia at baseline. No signs of infection. Patient will be offered TOC for home health/PT and further outpatient work up. [VK]  2004 I have not been able to get ahold of SW, unclear if they are in house this evening. Patient is agreeable for discharge with plan for SW to call to schedule outpatient PT/HH and PCP follow up. [VK]    Clinical Course User Index [VK] Rexford Maus, DO                             Medical Decision Making This patient presents to the ED with chief complaint(s) of generalized weakness with pertinent past medical history of COPD, CHF, AAA which further complicates the presenting complaint. The complaint involves an extensive differential diagnosis and also carries with it a high risk of complications and morbidity.    The differential diagnosis includes dehydration, electrolyte abnormality, anemia, ACS, arrhythmia, infection, no focal neurologic deficits making CVA  unlikely  Additional history obtained: Additional history obtained from N/A Records reviewed previous admission documents  ED Course and Reassessment: On patient's arrival to the emergency department initially and soft blood pressures but on my evaluation in the room, blood pressure spontaneously improved to the 120s.  He is otherwise in no acute distress without focal neurologic  deficits on exam.  The patient was seen in the emergency department 2 days ago and had workup that included negative COVID test, negative chest x-ray, negative CT stone search and negative urine.  He had a mild increase of his creatinine but otherwise no acute abnormalities on his labs.  Patient will have repeat labs performed today and will be closely monitored.  May require a TOC consult for possible home health/PT.  Independent labs interpretation:  The following labs were independently interpreted: mild hyperkalemia, Cr at baseline  Independent visualization of imaging: - N/A  Consultation: - Consulted or discussed management/test interpretation w/ external professional: TOC  Consideration for admission or further workup: Patient has no emergent conditions requiring admission or further work-up at this time and is stable for discharge home with primary care follow-up  Social Determinants of health: no primary care doctor    Amount and/or Complexity of Data Reviewed Labs: ordered.  Risk Prescription drug management.          Final Clinical Impression(s) / ED Diagnoses Final diagnoses:  Generalized weakness    Rx / DC Orders ED Discharge Orders     None         Rexford Maus, DO 12/22/22 2008

## 2022-12-24 ENCOUNTER — Emergency Department (HOSPITAL_COMMUNITY)
Admission: EM | Admit: 2022-12-24 | Discharge: 2022-12-27 | Disposition: A | Discharge status: Skilled Nursing Facility | Payer: Non-veteran care | Attending: Emergency Medicine | Admitting: Emergency Medicine

## 2022-12-24 ENCOUNTER — Encounter (HOSPITAL_COMMUNITY): Payer: Self-pay | Admitting: Emergency Medicine

## 2022-12-24 ENCOUNTER — Other Ambulatory Visit: Payer: Self-pay

## 2022-12-24 DIAGNOSIS — R531 Weakness: Secondary | ICD-10-CM | POA: Diagnosis present

## 2022-12-24 DIAGNOSIS — N410 Acute prostatitis: Secondary | ICD-10-CM | POA: Diagnosis not present

## 2022-12-24 NOTE — ED Triage Notes (Signed)
Pt endorses pain and weakness from his rectum to his toes, worse on right side. States this is his 3rd visit in 3 days. Thinks it has been going on for a month maybe. States he is too weak to take care of himself at home and needs PT. York Spaniel he is done with all the blood work.

## 2022-12-25 LAB — URINALYSIS, ROUTINE W REFLEX MICROSCOPIC
Bacteria, UA: NONE SEEN
Bilirubin Urine: NEGATIVE
Glucose, UA: NEGATIVE mg/dL
Hgb urine dipstick: NEGATIVE
Ketones, ur: NEGATIVE mg/dL
Leukocytes,Ua: NEGATIVE
Nitrite: NEGATIVE
Specific Gravity, Urine: 1.015 (ref 1.005–1.030)
pH: 6 (ref 5.0–8.0)

## 2022-12-25 LAB — BASIC METABOLIC PANEL
Anion gap: 7 (ref 5–15)
BUN: 51 mg/dL — ABNORMAL HIGH (ref 8–23)
CO2: 19 mmol/L — ABNORMAL LOW (ref 22–32)
Calcium: 8.6 mg/dL — ABNORMAL LOW (ref 8.9–10.3)
Chloride: 110 mmol/L (ref 98–111)
Creatinine, Ser: 1.72 mg/dL — ABNORMAL HIGH (ref 0.61–1.24)
GFR, Estimated: 38 mL/min — ABNORMAL LOW (ref >60)
Glucose, Bld: 132 mg/dL — ABNORMAL HIGH (ref 70–99)
Potassium: 4.6 mmol/L (ref 3.5–5.1)
Sodium: 136 mmol/L (ref 135–145)

## 2022-12-25 LAB — CBC WITH DIFFERENTIAL/PLATELET
Abs Immature Granulocytes: 0.02 10*3/uL (ref 0.00–0.07)
Basophils Absolute: 0 10*3/uL (ref 0.0–0.1)
Basophils Relative: 1 %
Eosinophils Absolute: 0.1 10*3/uL (ref 0.0–0.5)
Eosinophils Relative: 1 %
HCT: 35.6 % — ABNORMAL LOW (ref 39.0–52.0)
Hemoglobin: 12.2 g/dL — ABNORMAL LOW (ref 13.0–17.0)
Immature Granulocytes: 0 %
Lymphocytes Relative: 25 %
Lymphs Abs: 1.4 10*3/uL (ref 0.7–4.0)
MCH: 32.4 pg (ref 26.0–34.0)
MCHC: 34.3 g/dL (ref 30.0–36.0)
MCV: 94.4 fL (ref 80.0–100.0)
Monocytes Absolute: 0.4 10*3/uL (ref 0.1–1.0)
Monocytes Relative: 7 %
Neutro Abs: 3.8 10*3/uL (ref 1.7–7.7)
Neutrophils Relative %: 66 %
Platelets: 123 10*3/uL — ABNORMAL LOW (ref 150–400)
RBC: 3.77 MIL/uL — ABNORMAL LOW (ref 4.22–5.81)
RDW: 14 % (ref 11.5–15.5)
WBC: 5.8 10*3/uL (ref 4.0–10.5)
nRBC: 0 % (ref 0.0–0.2)

## 2022-12-25 MED ORDER — CEPHALEXIN 500 MG PO CAPS: 500.0000 mg | ORAL_CAPSULE | Freq: Three times a day (TID) | ORAL | 0 refills | Status: DC

## 2022-12-25 MED ORDER — SODIUM CHLORIDE 0.9 % IV BOLUS
1000.0000 mL | Freq: Once | INTRAVENOUS | Status: AC
  Administered 2022-12-25: 1000 mL via INTRAVENOUS

## 2022-12-25 MED ORDER — SODIUM CHLORIDE 0.9 % IV SOLN
1.0000 g | Freq: Once | INTRAVENOUS | Status: AC
  Administered 2022-12-25: 1 g via INTRAVENOUS
  Filled 2022-12-25: qty 10

## 2022-12-25 NOTE — Progress Notes (Signed)
Contacted [facility] with pt and it was found that pt is not service connected and would not have benefits for SNF. Pt reapplied and understands he will need to see a [facility] provider once every 2 years in order to remain established.

## 2022-12-25 NOTE — [place] FL2 (Addendum)
[place] MEDICAID FL2 LEVEL OF CARE FORM     IDENTIFICATION  Patient Name: Vincent Baxter Birthdate: [birth year] Sex: male Admission Date (Current Location): 12/24/2022  [facility] and [place] Number:  Producer, television/film/video and Address:  [facility],  [address] [place], [place] [address]      Provider Number: [id number] [id number]  Attending Physician Name and Address:  Default, Provider, MD  Relative Name and Phone Number:  Murtis Sink (sister) [phone]    Current Level of Care: Hospital Recommended Level of Care: Skilled Nursing Facility Prior Approval Number:    Date Approved/Denied:   PASRR Number: [id number]  Discharge Plan: SNF    Current Diagnoses: Patient Active Problem List   Diagnosis Date Noted   Cardiomyopathy (HCC) 04/19/2022   Left ventricular hypertrophy 04/19/2022   Renal mass 03/23/2022   Rib pain 03/23/2022   Bradycardia 03/22/2022   Abnormal EKG    PAC (premature atrial contraction)    PVC (premature ventricular contraction)    Smoking    Right flank pain 05/02/2020   Elevated troponin 05/01/2020   New onset of congestive heart failure (HCC) 05/01/2020   AAA (abdominal aortic aneurysm) (HCC)    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   ANOREXIA 02/23/2009   DYSLIPIDEMIA 01/18/2009   DM2 (diabetes mellitus, type 2) (HCC) 01/10/2009   GERD 01/10/2009   Essential hypertension 01/04/2009   RENAL CALCULUS 01/04/2009    Orientation RESPIRATION BLADDER Height & Weight     Self, Time, Situation, Place  Normal Continent Weight: 43 kg Height:  5\' 6"  (167.6 cm)  BEHAVIORAL SYMPTOMS/MOOD NEUROLOGICAL BOWEL NUTRITION STATUS      Continent Diet (Regular)  AMBULATORY STATUS COMMUNICATION OF NEEDS Skin   Limited Assist Verbally Normal                       Personal Care Assistance Level of Assistance  Bathing, Feeding, Dressing Bathing Assistance: Limited assistance Feeding assistance:  Independent Dressing Assistance: Limited assistance     Functional Limitations Info  Sight, Hearing, Speech Sight Info: Adequate Hearing Info: Adequate Speech Info: Adequate    SPECIAL CARE FACTORS FREQUENCY  PT (By licensed PT), OT (By licensed OT)     PT Frequency: 5x per week OT Frequency: 5x per week            Contractures Contractures Info: Not present    Additional Factors Info  Code Status, Allergies Code Status Info: Full Allergies Info: No Known Allergies           Current Medications (12/25/2022):  This is the current hospital active medication list No current facility-administered medications for this encounter.   Current Outpatient Medications  Medication Sig Dispense Refill   cephALEXin (KEFLEX) 500 MG capsule Take 1 capsule (500 mg total) by mouth 3 (three) times daily. 30 capsule 0   acetaminophen (TYLENOL) 500 MG tablet Take 1 tablet (500 mg total) by mouth every 6 (six) hours as needed. 30 tablet 0   diclofenac Sodium (VOLTAREN) 1 % GEL Apply 4 g topically 4 (four) times daily. (Patient not taking: Reported on 12/20/2022) 100 g 0   lidocaine (LIDODERM) 5 % Place 1 patch onto the skin daily. Remove & Discard patch within 12 hours or as directed by MD 30 patch 0     Discharge Medications: Please see discharge summary for a list of discharge medications.  Relevant Imaging Results:  Relevant Lab Results:   Additional Information SSN: [id number]  Lavenia Atlas, RN

## 2022-12-25 NOTE — Progress Notes (Addendum)
Awaiting PT.   Addend @ 12:39 PM This CSW contacted [facility] with [facility] to request pt's social worker information.

## 2022-12-25 NOTE — Care Management (Addendum)
Transition of Care [facility]) - Emergency Department Mini Assessment   Patient Details  Name: Vincent Baxter MRN: [id number] Date of Birth: 1935/01/06  Transition of Care [facility]) CM/SW Contact:    Lavenia Atlas, RN Phone Number: 12/25/2022, 4:50 PM   Clinical Narrative: Huey Bienenstock, #[id number] A, Notified EDP, awaiting EDP Nanavati to sign FL2. Faxed patient out, awaiting bed offers and insurance auth.  TOC will continue to follow for needs.   ED Mini Assessment: What brought you to the Emergency Department? : flank, rectum, pelvic pain and generalized weakness  Barriers to Discharge: Continued Medical Work up  [employer] interventions: coordinating potential short term SNF placement     Interventions which prevented an admission or readmission: SNF Placement    Patient Contact and Communications        ,            CMS Medicare.gov Compare Post Acute Care list provided to:: Patient Choice offered to / list presented to : Patient  Admission diagnosis:  weakness, legs, back, unable to stand Patient Active Problem List   Diagnosis Date Noted   Cardiomyopathy (HCC) 04/19/2022   Left ventricular hypertrophy 04/19/2022   Renal mass 03/23/2022   Rib pain 03/23/2022   Bradycardia 03/22/2022   Abnormal EKG    PAC (premature atrial contraction)    PVC (premature ventricular contraction)    Smoking    Right flank pain 05/02/2020   Elevated troponin 05/01/2020   New onset of congestive heart failure (HCC) 05/01/2020   AAA (abdominal aortic aneurysm) (HCC)    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 [place] [address] Phone: [phone] Fax:  [phone]  [id number] - [place], [place] - [address] [place] [address] Phone: [phone] Fax: [phone]

## 2022-12-25 NOTE — ED Provider Notes (Signed)
[place] EMERGENCY DEPARTMENT AT [facility] Provider Note   CSN: [id number] Arrival date & time: 12/24/22  1448     History  Chief Complaint  Patient presents with   Weakness    Vincent Baxter is a 88 y.o. male.  Patient presents to the emergency apartment for flank pain, pelvic pain, rectal pain and generalized weakness.  Patient reports that it has been going on for about 6 months.  He says he was in the ED multiple times with similar symptoms.  He thinks that it feels similar to when he had kidney stones in the past.       Home Medications Prior to Admission medications   Medication Sig Start Date End Date Taking? Authorizing Provider  cephALEXin (KEFLEX) 500 MG capsule Take 1 capsule (500 mg total) by mouth 3 (three) times daily. 12/25/22  Yes Elouise Divelbiss, Canary Brim, MD  acetaminophen (TYLENOL) 500 MG tablet Take 1 tablet (500 mg total) by mouth every 6 (six) hours as needed. 12/20/22   Linwood Dibbles, MD  diclofenac Sodium (VOLTAREN) 1 % GEL Apply 4 g topically 4 (four) times daily. Patient not taking: Reported on 12/20/2022 06/21/22   Melene Plan, DO  lidocaine (LIDODERM) 5 % Place 1 patch onto the skin daily. Remove & Discard patch within 12 hours or as directed by MD 12/20/22   Linwood Dibbles, MD      Allergies    Patient has no known allergies.    Review of Systems   Review of Systems  Physical Exam Updated Vital Signs BP 125/83   Pulse 77   Temp (!) 97.4 F (36.3 C) (Oral)   Resp 17   Ht 5\' 6"  (1.676 m)   Wt 43 kg   SpO2 99%   BMI 15.30 kg/m  Physical Exam Vitals and nursing note reviewed.  Constitutional:      General: He is not in acute distress.    Appearance: He is well-developed.  HENT:     Head: Normocephalic and atraumatic.     Mouth/Throat:     Mouth: Mucous membranes are moist.  Eyes:     General: Vision grossly intact. Gaze aligned appropriately.     Extraocular Movements: Extraocular movements intact.     Conjunctiva/sclera: Conjunctivae  normal.  Cardiovascular:     Rate and Rhythm: Normal rate and regular rhythm.     Pulses: Normal pulses.     Heart sounds: Normal heart sounds, S1 normal and S2 normal. No murmur heard.    No friction rub. No gallop.  Pulmonary:     Effort: Pulmonary effort is normal. No respiratory distress.     Breath sounds: Normal breath sounds.  Abdominal:     Palpations: Abdomen is soft.     Tenderness: There is no abdominal tenderness. There is no guarding or rebound.     Hernia: No hernia is present.  Genitourinary:    Comments: Prostate enlarged, mildly tender, boggy, no nodules Musculoskeletal:        General: No swelling.     Cervical back: Full passive range of motion without pain, normal range of motion and neck supple. No pain with movement, spinous process tenderness or muscular tenderness. Normal range of motion.     Right lower leg: No edema.     Left lower leg: No edema.  Skin:    General: Skin is warm and dry.     Capillary Refill: Capillary refill takes less than 2 seconds.     Findings:  No ecchymosis, erythema, lesion or wound.  Neurological:     Mental Status: He is alert and oriented to person, place, and time.     GCS: GCS eye subscore is 4. GCS verbal subscore is 5. GCS motor subscore is 6.     Cranial Nerves: Cranial nerves 2-12 are intact.     Sensory: Sensation is intact.     Motor: Motor function is intact. No weakness or abnormal muscle tone.     Coordination: Coordination is intact.  Psychiatric:        Mood and Affect: Mood normal.        Speech: Speech normal.        Behavior: Behavior normal.     ED Results / Procedures / Treatments   Labs (all labs ordered are listed, but only abnormal results are displayed) Labs Reviewed  URINALYSIS, ROUTINE W REFLEX MICROSCOPIC - Abnormal; Notable for the following components:      Result Value   Protein, ur TRACE (*)    All other components within normal limits  BASIC METABOLIC PANEL - Abnormal; Notable for the  following components:   CO2 19 (*)    Glucose, Bld 132 (*)    BUN 51 (*)    Creatinine, Ser 1.72 (*)    Calcium 8.6 (*)    GFR, Estimated 38 (*)    All other components within normal limits  CBC WITH DIFFERENTIAL/PLATELET - Abnormal; Notable for the following components:   RBC 3.77 (*)    Hemoglobin 12.2 (*)    HCT 35.6 (*)    Platelets 123 (*)    All other components within normal limits  CBC WITH DIFFERENTIAL/PLATELET    EKG None  Radiology No results found.  Procedures Procedures    Medications Ordered in ED Medications  sodium chloride 0.9 % bolus 1,000 mL (1,000 mLs Intravenous Bolus 12/25/22 0034)    ED Course/ Medical Decision Making/ A&P                             Medical Decision Making Amount and/or Complexity of Data Reviewed Labs: ordered.   Differential Diagnosis considered includes, but not limited to: Appendicitis; colitis; diverticulitis; bowel obstruction; cystitis; nephrolithiasis; pyelonephritis.   Presents to the emergency department for evaluation of bilateral flank pain and rectal pain.  Patient has been seen twice already in the ED.  He insist that he likely has a kidney stone, but he did have a renal stone study 4 days ago that did not show ureterolithiasis.  Pain is unchanged, doubt that he is now passing a stone.  Difficult to get the patient to except this, however.  His workup has been reassuring.  Creatinine at baseline.  Urinalysis without signs of infection.  Prostate exam, however, is somewhat tender, enlarged and boggy.  With his rectal pain and symptoms, consider prostatitis, will treat empirically.  Patient reports that his legs have been weak and giving out for months now.  He thinks he needs physical therapy.  He would except a rehab stay for physical therapy if offered. TOC and PT consults ordered.        Final Clinical Impression(s) / ED Diagnoses Final diagnoses:  Weakness  Acute prostatitis    Rx / DC Orders ED  Discharge Orders          Ordered    cephALEXin (KEFLEX) 500 MG capsule  3 times daily        12/25/22  1610              Gilda Crease, MD 12/25/22 [phone]

## 2022-12-25 NOTE — Evaluation (Signed)
Physical Therapy Evaluation Patient Details Name: Vincent Baxter MRN: [id number] DOB: Oct 24, 1934 Today's Date: 12/25/2022  History of Present Illness  87-yo male  presenting to the emergency department for generalized weakness.    PMH: COPD, CHF, diabetes, AAA.  Clinical Impression  Pt admitted with above diagnosis.  Pt presents with diffuse muscle atrophy, weakness and higher balance deficits placing him at risk for falls. Pt endorses multiple falls at home, decr po intake recently. Pt will likely benefit from post acute rehab. Will follow in acute setting  Pt currently with functional limitations due to the deficits listed below (see PT Problem List). Pt will benefit from acute skilled PT to increase their independence and safety with mobility to allow discharge.           Assistance Recommended at Discharge    If plan is discharge home, recommend the following:  Can travel by private vehicle  A little help with walking and/or transfers;Assistance with cooking/housework;Assist for transportation;Help with stairs or ramp for entrance   Yes    Equipment Recommendations Other (comment) (TBD)  Recommendations for Other Services       Functional Status Assessment Patient has had a recent decline in their functional status and demonstrates the ability to make significant improvements in function in a reasonable and predictable amount of time.     Precautions / Restrictions Precautions Precautions: Fall Restrictions Weight Bearing Restrictions: No      Mobility  Bed Mobility Overal bed mobility: Needs Assistance Bed Mobility: Supine to Sit     Supine to sit: Modified independent (Device/Increase time)     General bed mobility comments: use of rail, no physical assist    Transfers Overall transfer level: Needs assistance Equipment used: Rolling walker (2 wheels) Transfers: Sit to/from Stand Sit to Stand: Min guard           General transfer comment: for safety     Ambulation/Gait Ambulation/Gait assistance: Min assist Gait Distance (Feet): 60 Feet Assistive device: None, 1 person hand held assist Gait Pattern/deviations: Step-through pattern, Drifts right/left, Narrow base of support       General Gait Details: pt with near scissoring at times, multi-directional LOB. min assist for balance and fall prevention  Stairs            Wheelchair Mobility     Tilt Bed    Modified Rankin (Stroke Patients Only)       Balance Overall balance assessment: Needs assistance Sitting-balance support: Feet supported Sitting balance-Leahy Scale: Good     Standing balance support: Single extremity supported, During functional activity Standing balance-Leahy Scale: Fair                               Pertinent Vitals/Pain Pain Assessment Pain Assessment: Faces Faces Pain Scale: Hurts whole lot Pain Location: hips/groin, when urinating Pain Descriptors / Indicators: Burning, Tightness Pain Intervention(s): Limited activity within patient's tolerance, Monitored during session    Home Living Family/patient expects to be discharged to:: Private residence Living Arrangements: Alone Available Help at Discharge: Family Type of Home: House Home Access: Stairs to enter   [employer] of Steps: 7 in the back, one in the front   Home Layout: One level Home Equipment: None      Prior Function Prior Level of Function : Independent/Modified Independent;Driving                     Hand Dominance  Extremity/Trunk Assessment   Upper Extremity Assessment Upper Extremity Assessment: Overall WFL for tasks assessed    Lower Extremity Assessment Lower Extremity Assessment: RLE deficits/detail;LLE deficits/detail RLE Deficits / Details: grossly 3+/5, AROM WFL, diffuse meuscle atrophy throughout LLE Deficits / Details: grossly 4/5, AROM WFL, diffuse meuscle atrophy throughout       Communication    Communication: No difficulties  Cognition Arousal/Alertness: Awake/alert Behavior During Therapy: WFL for tasks assessed/performed Overall Cognitive Status: Within Functional Limits for tasks assessed                                 General Comments: pt is easily agitated, repeatedly states "lady that is what I am telling", "what is that [place] do"        General Comments      Exercises     Assessment/Plan    PT Assessment Patient needs continued PT services  PT Problem List Decreased strength;Decreased balance;Decreased mobility;Decreased knowledge of use of DME       PT Treatment Interventions DME instruction;Therapeutic exercise;Gait training;Functional mobility training;Therapeutic activities;Patient/family education    PT Goals (Current goals can be found in the Care Plan section)  Acute Rehab PT Goals Patient Stated Goal: to get stronger, get some xrays and have less pain PT Goal Formulation: With patient Time For Goal Achievement: 01/08/23 Potential to Achieve Goals: Good    Frequency Min 1X/week     Co-evaluation               AM-PAC PT "6 Clicks" Mobility  Outcome Measure Help needed turning from your back to your side while in a flat bed without using bedrails?: None Help needed moving from lying on your back to sitting on the side of a flat bed without using bedrails?: None Help needed moving to and from a bed to a chair (including a wheelchair)?: A Little Help needed standing up from a chair using your arms (e.g., wheelchair or bedside chair)?: A Little Help needed to walk in hospital room?: A Little Help needed climbing 3-5 steps with a railing? : A Little 6 Click Score: 20    End of Session   Activity Tolerance: Patient tolerated treatment well Patient left: with call bell/phone within reach;in bed;with bed alarm set   PT Visit Diagnosis: Other abnormalities of gait and mobility (R26.89);Muscle weakness (generalized)  (M62.81);History of falling (Z91.81)    Time: 1228-1300 PT Time Calculation (min) (ACUTE ONLY): 32 min   Charges:   PT Evaluation $PT Eval Low Complexity: 1 Low PT Treatments $Gait Training: 8-22 mins PT General Charges $$ ACUTE PT VISIT: 1 Visit         Labrenda Lasky, PT  Acute Rehab Dept ([facility]) [phone]  12/25/2022   [facility] 12/25/2022, 1:19 PM

## 2022-12-25 NOTE — ED Notes (Signed)
Doing well all day after PT eval. Ambulates back and forth to bathroom without assistance, bed alarm on.

## 2022-12-26 MED ORDER — ACETAMINOPHEN 325 MG PO TABS
650.0000 mg | ORAL_TABLET | Freq: Once | ORAL | Status: AC
  Administered 2022-12-26: 650 mg via ORAL
  Filled 2022-12-26: qty 2

## 2022-12-26 MED ORDER — IBUPROFEN 200 MG PO TABS
600.0000 mg | ORAL_TABLET | Freq: Once | ORAL | Status: AC
  Administered 2022-12-26: 600 mg via ORAL
  Filled 2022-12-26: qty 3

## 2022-12-26 MED ORDER — IBUPROFEN 200 MG PO TABS: 600.0000 mg | ORAL_TABLET | Freq: Once | ORAL | Status: DC

## 2022-12-26 NOTE — ED Notes (Signed)
Pt wide awake c/o R flank and low back pain order for pain medication received but due to elevated BUN and creatin EDP Nanavati contacted to see if it is ok to give med ordered. Currently waiting reply from doctor medication held until ok by EDP.

## 2022-12-26 NOTE — ED Notes (Signed)
Sitting up in chair watching tv. Calm. No complaints.

## 2022-12-26 NOTE — ED Notes (Signed)
Ambulatory to bathroom. Supplies set up for patient to shave and shower. Patient able to shave and bathe himself. Instructed to call for assistance as needed. Clean scrubs given, oral care supplies for teeth brushing given along with other personal hygiene products. Patient able to do ADLs independently.

## 2022-12-26 NOTE — Progress Notes (Signed)
Patients insurance authorization was approved.

## 2022-12-26 NOTE — ED Provider Notes (Signed)
  Physical Exam  BP 118/88 (BP Location: Right Arm)   Pulse 81   Temp 98.4 F (36.9 C) (Oral)   Resp 20   Ht 5\' 6"  (1.676 m)   Wt 43 kg   SpO2 98%   BMI 15.30 kg/m   Physical Exam  Procedures  Procedures  ED Course / MDM    Medical Decision Making Amount and/or Complexity of Data Reviewed Labs: ordered.  Risk Prescription drug management.   Sleeping comfortably.  Pending nursing home placement.  FL 2 has been signed.  Transitional care is following.       Benjiman Core, MD 12/26/22 [phone]

## 2022-12-26 NOTE — Progress Notes (Addendum)
Presented bed offers to pt. He selected [facility]. Notified Kia. Will initiate Auth.  Addend @ 9:34 AM Auth pending.

## 2022-12-26 NOTE — ED Notes (Signed)
Pain medications ibuprophen changed to tylenol due to elevated BUN 51 and creatine 1.72.

## 2022-12-27 DIAGNOSIS — I5030 Unspecified diastolic (congestive) heart failure: Secondary | ICD-10-CM | POA: Diagnosis not present

## 2022-12-27 DIAGNOSIS — E119 Type 2 diabetes mellitus without complications: Secondary | ICD-10-CM | POA: Diagnosis not present

## 2022-12-27 DIAGNOSIS — E785 Hyperlipidemia, unspecified: Secondary | ICD-10-CM | POA: Diagnosis not present

## 2022-12-27 DIAGNOSIS — K219 Gastro-esophageal reflux disease without esophagitis: Secondary | ICD-10-CM | POA: Diagnosis not present

## 2022-12-27 DIAGNOSIS — I714 Abdominal aortic aneurysm, without rupture, unspecified: Secondary | ICD-10-CM | POA: Diagnosis not present

## 2022-12-27 DIAGNOSIS — J439 Emphysema, unspecified: Secondary | ICD-10-CM | POA: Diagnosis not present

## 2022-12-27 DIAGNOSIS — R52 Pain, unspecified: Secondary | ICD-10-CM | POA: Diagnosis not present

## 2022-12-27 DIAGNOSIS — I10 Essential (primary) hypertension: Secondary | ICD-10-CM | POA: Diagnosis not present

## 2022-12-27 DIAGNOSIS — R531 Weakness: Secondary | ICD-10-CM | POA: Diagnosis not present

## 2022-12-27 DIAGNOSIS — I429 Cardiomyopathy, unspecified: Secondary | ICD-10-CM | POA: Diagnosis not present

## 2022-12-27 DIAGNOSIS — Z7401 Bed confinement status: Secondary | ICD-10-CM | POA: Diagnosis not present

## 2022-12-27 DIAGNOSIS — M6281 Muscle weakness (generalized): Secondary | ICD-10-CM | POA: Diagnosis not present

## 2022-12-27 NOTE — Progress Notes (Addendum)
Spoke with pt at bedside to inform of discharge to [facility] today. Pt initially stated he would like to go home because he's "got some loose ends to take care of." Pt explained he needed to pay bills. This CSW contacted Admissions rep who informed pt that he would need to transport directly to the SNF to keep his bed and could work on his responsibilities from there. Pt has agreed at this time. Pt stated his car is outside and this CSW encouraged him to have someone pick it up and return it home. Pt aware he is unable to have his car on the SNF premises (per admissions rep) due to it being a liability. Pt plans to contact his nephew who lives/works in [facility] to have him pick it up and take it home. Pt requested he get things out of his car but has since stated he won't worry with that. RN aware. PTAR called for transport.

## 2022-12-27 NOTE — ED Notes (Signed)
Patient discharged off unit to facility per provider. Patient discharge information and belongings given to [facility] staff. Patient off unit on stretcher. Patient transported by [facility]

## 2022-12-27 NOTE — ED Provider Notes (Signed)
Emergency Medicine Observation Re-evaluation Note  Vincent Baxter is a 88 y.o. male, seen on rounds today.  Pt initially presented to the ED for complaints of Weakness Currently, the patient is d/c to SNF this AM.  Physical Exam  BP 104/71 (BP Location: Left Arm)   Pulse 71   Temp 97.8 F (36.6 C) (Oral)   Resp 18   Ht 5\' 6"  (1.676 m)   Wt 43 kg   SpO2 97%   BMI 15.30 kg/m  Physical Exam General: Calm Cardiac: Well perfused  Lungs: Even respirations  Psych: Calm  ED Course / MDM  EKG:   I have reviewed the labs performed to date as well as medications administered while in observation.  Recent changes in the last 24 hours include patient to be discharged to [facility] this AM.  Plan  Current plan is for d/c to [facility].    Maia Plan, MD 12/27/22 [phone]

## 2022-12-27 NOTE — Progress Notes (Signed)
This CSW has informed Kia at [facility] of Auth approval. Inquired about admit time. EDP and RN aware. PTAR to transport.

## 2022-12-28 DIAGNOSIS — R531 Weakness: Secondary | ICD-10-CM | POA: Diagnosis not present

## 2022-12-29 ENCOUNTER — Other Ambulatory Visit (HOSPITAL_COMMUNITY): Payer: Self-pay

## 2023-07-29 ENCOUNTER — Other Ambulatory Visit: Payer: Self-pay

## 2023-07-29 ENCOUNTER — Emergency Department (HOSPITAL_COMMUNITY)
Admission: EM | Admit: 2023-07-29 | Discharge: 2023-07-29 | Disposition: A | Discharge status: Home / Self Care | Payer: No Typology Code available for payment source | Attending: Emergency Medicine | Admitting: Emergency Medicine

## 2023-07-29 ENCOUNTER — Emergency Department (HOSPITAL_COMMUNITY): Payer: No Typology Code available for payment source

## 2023-07-29 ENCOUNTER — Encounter (HOSPITAL_COMMUNITY): Payer: Self-pay

## 2023-07-29 DIAGNOSIS — X58XXXA Exposure to other specified factors, initial encounter: Secondary | ICD-10-CM | POA: Insufficient documentation

## 2023-07-29 DIAGNOSIS — S39012A Strain of muscle, fascia and tendon of lower back, initial encounter: Secondary | ICD-10-CM | POA: Diagnosis not present

## 2023-07-29 DIAGNOSIS — I10 Essential (primary) hypertension: Secondary | ICD-10-CM | POA: Insufficient documentation

## 2023-07-29 DIAGNOSIS — S3992XA Unspecified injury of lower back, initial encounter: Secondary | ICD-10-CM | POA: Diagnosis present

## 2023-07-29 DIAGNOSIS — R3911 Hesitancy of micturition: Secondary | ICD-10-CM | POA: Diagnosis not present

## 2023-07-29 LAB — URINALYSIS, ROUTINE W REFLEX MICROSCOPIC
Bacteria, UA: NONE SEEN
Bilirubin Urine: NEGATIVE
Glucose, UA: NEGATIVE mg/dL
Hgb urine dipstick: NEGATIVE
Ketones, ur: NEGATIVE mg/dL
Leukocytes,Ua: NEGATIVE
Nitrite: NEGATIVE
Protein, ur: 30 mg/dL — AB
Specific Gravity, Urine: 1.011 (ref 1.005–1.030)
pH: 6 (ref 5.0–8.0)

## 2023-07-29 LAB — CBC WITH DIFFERENTIAL/PLATELET
Abs Immature Granulocytes: 0.03 10*3/uL (ref 0.00–0.07)
Basophils Absolute: 0 10*3/uL (ref 0.0–0.1)
Basophils Relative: 0 %
Eosinophils Absolute: 0 10*3/uL (ref 0.0–0.5)
Eosinophils Relative: 0 %
HCT: 38.2 % — ABNORMAL LOW (ref 39.0–52.0)
Hemoglobin: 13.1 g/dL (ref 13.0–17.0)
Immature Granulocytes: 0 %
Lymphocytes Relative: 19 %
Lymphs Abs: 1.3 10*3/uL (ref 0.7–4.0)
MCH: 33.2 pg (ref 26.0–34.0)
MCHC: 34.3 g/dL (ref 30.0–36.0)
MCV: 96.7 fL (ref 80.0–100.0)
Monocytes Absolute: 0.5 10*3/uL (ref 0.1–1.0)
Monocytes Relative: 7 %
Neutro Abs: 5.1 10*3/uL (ref 1.7–7.7)
Neutrophils Relative %: 74 %
Platelets: 152 10*3/uL (ref 150–400)
RBC: 3.95 MIL/uL — ABNORMAL LOW (ref 4.22–5.81)
RDW: 14.1 % (ref 11.5–15.5)
WBC: 6.9 10*3/uL (ref 4.0–10.5)
nRBC: 0 % (ref 0.0–0.2)

## 2023-07-29 LAB — BASIC METABOLIC PANEL
Anion gap: 8 (ref 5–15)
BUN: 38 mg/dL — ABNORMAL HIGH (ref 8–23)
CO2: 22 mmol/L (ref 22–32)
Calcium: 9.3 mg/dL (ref 8.9–10.3)
Chloride: 109 mmol/L (ref 98–111)
Creatinine, Ser: 1.55 mg/dL — ABNORMAL HIGH (ref 0.61–1.24)
GFR, Estimated: 43 mL/min — ABNORMAL LOW (ref >60)
Glucose, Bld: 227 mg/dL — ABNORMAL HIGH (ref 70–99)
Potassium: 5.5 mmol/L — ABNORMAL HIGH (ref 3.5–5.1)
Sodium: 139 mmol/L (ref 135–145)

## 2023-07-29 LAB — POTASSIUM: Potassium: 5 mmol/L (ref 3.5–5.1)

## 2023-07-29 MED ORDER — AMLODIPINE BESYLATE 5 MG PO TABS
5.0000 mg | ORAL_TABLET | Freq: Once | ORAL | Status: AC
  Administered 2023-07-29: 5 mg via ORAL
  Filled 2023-07-29: qty 1

## 2023-07-29 MED ORDER — PREDNISONE 20 MG PO TABS
40.0000 mg | ORAL_TABLET | Freq: Every day | ORAL | 0 refills | Status: DC
  Filled 2023-07-29 – 2023-08-20 (×2): qty 6, 3d supply, fill #0

## 2023-07-29 MED ORDER — TAMSULOSIN HCL 0.4 MG PO CAPS
0.4000 mg | ORAL_CAPSULE | Freq: Every day | ORAL | 1 refills | Status: DC
  Filled 2023-07-29 – 2023-08-20 (×2): qty 30, 30d supply, fill #0

## 2023-07-29 MED ORDER — HYDROCODONE-ACETAMINOPHEN 5-325 MG PO TABS
1.0000 | ORAL_TABLET | Freq: Once | ORAL | Status: AC
  Administered 2023-07-29: 1 via ORAL
  Filled 2023-07-29: qty 1

## 2023-07-29 MED ORDER — HYDROCODONE-ACETAMINOPHEN 5-325 MG PO TABS
1.0000 | ORAL_TABLET | Freq: Four times a day (QID) | ORAL | 0 refills | Status: DC | PRN
  Filled 2023-07-29 – 2023-08-20 (×2): qty 20, 5d supply, fill #0

## 2023-07-29 NOTE — Discharge Instructions (Signed)
 Follow-up with [facility] for your problems with your urination.  You have also been referred to a primary care doctor at [facility] they can help with your back pain and your elevated blood pressure.  You can also go to the [place] to get that taken care of if you want to

## 2023-07-29 NOTE — ED Provider Triage Note (Signed)
Emergency Medicine Provider Triage Evaluation Note  Vincent Baxter , a 89 y.o. male  was evaluated in triage.  Pt complains of low back pain radiating down lateral aspects of legs bilaterally x 3 months. Worse on R flank. Worse over last 3 days, being unable to sleep. Has been able to ambulate. HX of AAA.   Endorses dysuria and urgency, increased shortness of breath, increased ankle swelling bilaterally.   Denies fever, cough, congestion, blurry vision, vertigo, headache, chest pain, abdominal pain, n/v/d.   Review of Systems  Positive: N/a Negative: N/a  Physical Exam  BP (!) 145/98 (BP Location: Left Arm)   Pulse 98   Temp 98 F (36.7 C) (Oral)   Resp 16   Ht 5\' 6"  (1.676 m)   Wt 46 kg   SpO2 100%   BMI 16.37 kg/m  Gen:   Awake, no distress   Resp:  Normal effort  MSK:   Moves extremities without difficulty  Other:    Medical Decision Making  Medically screening exam initiated at 3:20 PM.  Appropriate orders placed.  Vincent Baxter was informed that the remainder of the evaluation will be completed by another provider, this initial triage assessment does not replace that evaluation, and the importance of remaining in the ED until their evaluation is complete.     Lunette Stands, [place] 07/29/23 [phone]

## 2023-07-29 NOTE — ED Provider Notes (Addendum)
[place] EMERGENCY DEPARTMENT AT [facility] Provider Note   CSN: [id number] Arrival date & time: 07/29/23  1350     History  Chief Complaint  Patient presents with   Back Pain    Vincent Baxter is a 89 y.o. male.  Patient does not have a doctor and has no medical problems.  He complains of lower back pain radiating down his legs along with urinary hesitancy  The history is provided by the patient and medical records. No language interpreter was used.  Back Pain Location:  Lumbar spine Quality:  Aching Radiates to:  R thigh and L thigh Pain severity:  Moderate Pain is:  Worse during the day Onset quality:  Gradual Timing:  Intermittent Progression:  Waxing and waning Chronicity:  New Relieved by:  Nothing Worsened by:  Nothing Associated symptoms: no abdominal pain, no chest pain and no headaches        Home Medications Prior to Admission medications   Medication Sig Start Date End Date Taking? Authorizing Provider  HYDROcodone-acetaminophen (NORCO/VICODIN) 5-325 MG tablet Take 1 tablet by mouth every 6 (six) hours as needed for moderate pain (pain score 4-6). 07/29/23  Yes Bethann Berkshire, MD  predniSONE (DELTASONE) 20 MG tablet 2 tabs po daily x 3 days 07/29/23  Yes Bethann Berkshire, MD  tamsulosin (FLOMAX) 0.4 MG CAPS capsule Take 1 capsule (0.4 mg total) by mouth daily. 07/29/23  Yes Bethann Berkshire, MD  acetaminophen (TYLENOL) 500 MG tablet Take 1 tablet (500 mg total) by mouth every 6 (six) hours as needed. 12/20/22   Linwood Dibbles, MD  cephALEXin (KEFLEX) 500 MG capsule Take 1 capsule (500 mg total) by mouth 3 (three) times daily. 12/25/22   Gilda Crease, MD  diclofenac Sodium (VOLTAREN) 1 % GEL Apply 4 g topically 4 (four) times daily. Patient not taking: Reported on 12/20/2022 06/21/22   Melene Plan, DO  lidocaine (LIDODERM) 5 % Place 1 patch onto the skin daily. Remove & Discard patch within 12 hours or as directed by MD 12/20/22   Linwood Dibbles, MD       Allergies    Patient has no known allergies.    Review of Systems   Review of Systems  Constitutional:  Negative for appetite change and fatigue.  HENT:  Negative for congestion, ear discharge and sinus pressure.   Eyes:  Negative for discharge.  Respiratory:  Negative for cough.   Cardiovascular:  Negative for chest pain.  Gastrointestinal:  Negative for abdominal pain and diarrhea.  Genitourinary:  Positive for difficulty urinating. Negative for frequency and hematuria.  Musculoskeletal:  Positive for back pain.  Skin:  Negative for rash.  Neurological:  Negative for seizures and headaches.  Psychiatric/Behavioral:  Negative for hallucinations.     Physical Exam Updated Vital Signs BP (!) 147/106   Pulse 74   Temp 97.8 F (36.6 C) (Oral)   Resp 18   Ht 5\' 6"  (1.676 m)   Wt 46 kg   SpO2 98%   BMI 16.37 kg/m  Physical Exam Vitals and nursing note reviewed.  Constitutional:      Appearance: He is well-developed.  HENT:     Head: Normocephalic.     Nose: Nose normal.  Eyes:     General: No scleral icterus.    Conjunctiva/sclera: Conjunctivae normal.  Neck:     Thyroid: No thyromegaly.  Cardiovascular:     Rate and Rhythm: Normal rate and regular rhythm.     Heart sounds: No  murmur heard.    No friction rub. No gallop.  Pulmonary:     Breath sounds: No stridor. No wheezing or rales.  Chest:     Chest wall: No tenderness.  Abdominal:     General: There is no distension.     Tenderness: There is no abdominal tenderness. There is no rebound.  Musculoskeletal:        General: Normal range of motion.     Cervical back: Neck supple.     Comments: Tender lumbar spine.  Negative straight leg raise  Lymphadenopathy:     Cervical: No cervical adenopathy.  Skin:    Findings: No erythema or rash.  Neurological:     Mental Status: He is alert and oriented to person, place, and time.     Motor: No abnormal muscle tone.     Coordination: Coordination normal.   Psychiatric:        Behavior: Behavior normal.     ED Results / Procedures / Treatments   Labs (all labs ordered are listed, but only abnormal results are displayed) Labs Reviewed  CBC WITH DIFFERENTIAL/PLATELET - Abnormal; Notable for the following components:      Result Value   RBC 3.95 (*)    HCT 38.2 (*)    All other components within normal limits  BASIC METABOLIC PANEL - Abnormal; Notable for the following components:   Potassium 5.5 (*)    Glucose, Bld 227 (*)    BUN 38 (*)    Creatinine, Ser 1.55 (*)    GFR, Estimated 43 (*)    All other components within normal limits  URINALYSIS, ROUTINE W REFLEX MICROSCOPIC - Abnormal; Notable for the following components:   Protein, ur 30 (*)    All other components within normal limits  POTASSIUM    EKG EKG Interpretation Date/Time:  Monday July 29 2023 17:24:23 EST Ventricular Rate:  84 PR Interval:  183 QRS Duration:  92 QT Interval:  375 QTC Calculation: 444 R Axis:   25  Text Interpretation: Sinus rhythm Anterior infarct, old Confirmed by Bethann Berkshire [phone]) on 07/29/2023 6:04:47 PM  Radiology DG Chest 2 View Result Date: 07/29/2023 CLINICAL DATA:  Shortness of breath over the last 2 years. EXAM: CHEST - 2 VIEW COMPARISON:  12/20/2022 FINDINGS: Heart size is normal. Chronic aortic atherosclerosis. Pulmonary emphysema and scarring. No sign of active infiltrate or collapse. Pleural blunting presumed represent scarring. Tiny amount of pleural effusion not excluded but not favored. IMPRESSION: Pulmonary emphysema and scarring. No active disease. Pleural blunting presumed to represent scarring. Tiny amount of pleural effusion not excluded but not favored. Electronically Signed   By: Paulina Fusi M.D.   On: 07/29/2023 17:09   DG Lumbar Spine Complete Result Date: 07/29/2023 CLINICAL DATA:  Low back pain radiating down the lateral aspects of both legs for 3 months. EXAM: LUMBAR SPINE - COMPLETE 4+ VIEW COMPARISON:   Lumbar spine radiographs and MRI 03/22/2022 FINDINGS: There are 5 non-rib-bearing lumbar type vertebrae. No acute fracture is identified. Trace anterolisthesis is again seen at L4-5 related to advanced facet arthrosis. There is very mild disc space narrowing at L4-5 with preserved disc heights elsewhere. Aortic atherosclerosis is noted. IMPRESSION: Advanced L4-5 facet arthrosis with unchanged trace anterolisthesis. Electronically Signed   By: Sebastian Ache M.D.   On: 07/29/2023 16:40    Procedures Procedures    Medications Ordered in ED Medications  amLODipine (NORVASC) tablet 5 mg (has no administration in time range)  HYDROcodone-acetaminophen (NORCO/VICODIN) 5-325 MG  per tablet 1 tablet (has no administration in time range)    ED Course/ Medical Decision Making/ A&P    This patient presents to the ED for concern of back pain and urinary hesitancy, this involves an extensive number of treatment options, and is a complaint that carries with it a high risk of complications and morbidity.  The differential diagnosis includes enlarged prostate, UTI   Co morbidities that complicate the patient evaluation  None   Additional history obtained:  Additional history obtained from patient External records from outside source obtained and reviewed including hospital records   Lab Tests:  I Ordered, and personally interpreted labs.  The pertinent results include: Urinalysis negative, second potassium normal at 5   Imaging Studies ordered:  I ordered imaging studies including chest x-ray and lumbar series I independently visualized and interpreted imaging which showed advanced-L4-L5 facet arthrosis I agree with the radiologist interpretation   Cardiac Monitoring: / EKG:  The patient was maintained on a cardiac monitor.  I personally viewed and interpreted the cardiac monitored which showed an underlying rhythm of: Normal sinus rhythm   Consultations Obtained:  No  consultant  Problem List / ED Course / Critical interventions / Medication management  Urinary hesitancy and back pain, hypertension I ordered medication including Norvasc and pain medicine Reevaluation of the patient after these medicines showed that the patient improved I have reviewed the patients home medicines and have made adjustments as needed   Social Determinants of Health:  None   Test / Admission - Considered:  None                              Medical Decision Making Amount and/or Complexity of Data Reviewed Labs: ordered. Radiology: ordered. ECG/medicine tests: ordered.  Risk Prescription drug management.   Patient with hypertension and urinary hesitancy along with low back pain secondary to arthritic changes.  He is sent home on a short course of prednisone along with Norvasc for his blood pressure.  He is also given Flomax for his urinary problems.  Patient is referred to [facility]rology for his urinary problems and [place] [facility] for his blood pressure and elevated glucose        Final Clinical Impression(s) / ED Diagnoses Final diagnoses:  Strain of lumbar region, initial encounter  Primary hypertension  Urinary hesitancy    Rx / DC Orders ED Discharge Orders          Ordered    predniSONE (DELTASONE) 20 MG tablet        07/29/23 1840    HYDROcodone-acetaminophen (NORCO/VICODIN) 5-325 MG tablet  Every 6 hours PRN        07/29/23 1840    tamsulosin (FLOMAX) 0.4 MG CAPS capsule  Daily        07/29/23 1840              Bethann Berkshire, MD 07/31/23 1348    Bethann Berkshire, MD 07/31/23 1351

## 2023-07-29 NOTE — ED Triage Notes (Signed)
 Patient presents to ER for pain in lower back radiating to ankles. Patient states he hasn't been able to sleep for 3 days due to pain, patient states he can't lie down, can only sit.

## 2023-07-30 ENCOUNTER — Other Ambulatory Visit (HOSPITAL_COMMUNITY): Payer: Self-pay

## 2023-07-30 ENCOUNTER — Other Ambulatory Visit: Payer: Self-pay

## 2023-08-09 ENCOUNTER — Other Ambulatory Visit (HOSPITAL_COMMUNITY): Payer: Self-pay

## 2023-08-20 ENCOUNTER — Other Ambulatory Visit (HOSPITAL_COMMUNITY): Payer: Self-pay

## 2024-01-15 ENCOUNTER — Other Ambulatory Visit: Payer: Self-pay

## 2024-01-15 ENCOUNTER — Emergency Department (HOSPITAL_COMMUNITY)

## 2024-01-15 ENCOUNTER — Emergency Department (HOSPITAL_COMMUNITY)
Admission: EM | Admit: 2024-01-15 | Discharge: 2024-01-15 | Disposition: A | Discharge status: Home / Self Care | Attending: Emergency Medicine | Admitting: Emergency Medicine

## 2024-01-15 DIAGNOSIS — Z720 Tobacco use: Secondary | ICD-10-CM | POA: Diagnosis not present

## 2024-01-15 DIAGNOSIS — J449 Chronic obstructive pulmonary disease, unspecified: Secondary | ICD-10-CM | POA: Insufficient documentation

## 2024-01-15 DIAGNOSIS — E1165 Type 2 diabetes mellitus with hyperglycemia: Secondary | ICD-10-CM | POA: Diagnosis not present

## 2024-01-15 DIAGNOSIS — R350 Frequency of micturition: Secondary | ICD-10-CM | POA: Diagnosis present

## 2024-01-15 DIAGNOSIS — I509 Heart failure, unspecified: Secondary | ICD-10-CM | POA: Insufficient documentation

## 2024-01-15 DIAGNOSIS — N189 Chronic kidney disease, unspecified: Secondary | ICD-10-CM | POA: Diagnosis not present

## 2024-01-15 DIAGNOSIS — R7989 Other specified abnormal findings of blood chemistry: Secondary | ICD-10-CM | POA: Diagnosis not present

## 2024-01-15 DIAGNOSIS — E1122 Type 2 diabetes mellitus with diabetic chronic kidney disease: Secondary | ICD-10-CM | POA: Insufficient documentation

## 2024-01-15 DIAGNOSIS — I714 Abdominal aortic aneurysm, without rupture, unspecified: Secondary | ICD-10-CM | POA: Diagnosis not present

## 2024-01-15 DIAGNOSIS — I130 Hypertensive heart and chronic kidney disease with heart failure and stage 1 through stage 4 chronic kidney disease, or unspecified chronic kidney disease: Secondary | ICD-10-CM | POA: Insufficient documentation

## 2024-01-15 LAB — COMPREHENSIVE METABOLIC PANEL WITH GFR
ALT: 16 U/L (ref 0–44)
AST: 21 U/L (ref 15–41)
Albumin: 3.4 g/dL — ABNORMAL LOW (ref 3.5–5.0)
Alkaline Phosphatase: 72 U/L (ref 38–126)
Anion gap: 9 (ref 5–15)
BUN: 35 mg/dL — ABNORMAL HIGH (ref 8–23)
CO2: 19 mmol/L — ABNORMAL LOW (ref 22–32)
Calcium: 8.8 mg/dL — ABNORMAL LOW (ref 8.9–10.3)
Chloride: 112 mmol/L — ABNORMAL HIGH (ref 98–111)
Creatinine, Ser: 1.66 mg/dL — ABNORMAL HIGH (ref 0.61–1.24)
GFR, Estimated: 40 mL/min — ABNORMAL LOW (ref >60)
Glucose, Bld: 227 mg/dL — ABNORMAL HIGH (ref 70–99)
Potassium: 5.1 mmol/L (ref 3.5–5.1)
Sodium: 140 mmol/L (ref 135–145)
Total Bilirubin: 0.7 mg/dL (ref 0.0–1.2)
Total Protein: 6.7 g/dL (ref 6.5–8.1)

## 2024-01-15 LAB — URINALYSIS, ROUTINE W REFLEX MICROSCOPIC
Bilirubin Urine: NEGATIVE
Glucose, UA: NEGATIVE mg/dL
Hgb urine dipstick: NEGATIVE
Ketones, ur: NEGATIVE mg/dL
Leukocytes,Ua: NEGATIVE
Nitrite: NEGATIVE
Protein, ur: NEGATIVE mg/dL
Specific Gravity, Urine: 1.012 (ref 1.005–1.030)
pH: 5 (ref 5.0–8.0)

## 2024-01-15 LAB — CBC WITH DIFFERENTIAL/PLATELET
Abs Immature Granulocytes: 0.02 K/uL (ref 0.00–0.07)
Basophils Absolute: 0 K/uL (ref 0.0–0.1)
Basophils Relative: 1 %
Eosinophils Absolute: 0 K/uL (ref 0.0–0.5)
Eosinophils Relative: 1 %
HCT: 37 % — ABNORMAL LOW (ref 39.0–52.0)
Hemoglobin: 12.1 g/dL — ABNORMAL LOW (ref 13.0–17.0)
Immature Granulocytes: 0 %
Lymphocytes Relative: 16 %
Lymphs Abs: 0.9 K/uL (ref 0.7–4.0)
MCH: 32.6 pg (ref 26.0–34.0)
MCHC: 32.7 g/dL (ref 30.0–36.0)
MCV: 99.7 fL (ref 80.0–100.0)
Monocytes Absolute: 0.4 K/uL (ref 0.1–1.0)
Monocytes Relative: 7 %
Neutro Abs: 4.3 K/uL (ref 1.7–7.7)
Neutrophils Relative %: 75 %
Platelets: 128 K/uL — ABNORMAL LOW (ref 150–400)
RBC: 3.71 MIL/uL — ABNORMAL LOW (ref 4.22–5.81)
RDW: 15 % (ref 11.5–15.5)
WBC: 5.7 K/uL (ref 4.0–10.5)
nRBC: 0 % (ref 0.0–0.2)

## 2024-01-15 LAB — TROPONIN I (HIGH SENSITIVITY)
Troponin I (High Sensitivity): 93 ng/L — ABNORMAL HIGH (ref <18)
Troponin I (High Sensitivity): 58 ng/L — ABNORMAL HIGH (ref <18)

## 2024-01-15 LAB — LIPASE, BLOOD: Lipase: 30 U/L (ref 11–51)

## 2024-01-15 NOTE — ED Triage Notes (Signed)
 Patient to ED by POV with c/o pain. He states that he is having pain that goes from the bottom of his feet up to his ABD. He reports frequent urination and burning. He also reports bilateral ankle swelling but denies cardiac issues. He says he just want the doctor to look at him and fix him, multiple complaints.

## 2024-01-15 NOTE — Discharge Instructions (Addendum)
 Start taking aspirin  daily.  Follow up with a primary car

## 2024-01-15 NOTE — ED Provider Notes (Signed)
 I received the patient in signout.  Plan is to follow-up on urinalysis and second troponin.  Plan is for discharge if negative.  The patient's urinalysis and troponin are unremarkable.  He remained stable here and is discharged with return precautions.  Physical Exam  BP 121/86   Pulse (!) 108   Temp 98.1 F (36.7 C) (Oral)   Resp 16   Ht 5' 6 (1.676 m)   Wt 50 kg   SpO2 99%   BMI 17.79 kg/m   Physical Exam General: No acute distress  Procedures  Procedures  ED Course / MDM   Clinical Course as of 01/15/24 2004  Wed Jan 15, 2024  1600 X-ray shows no signs of spinal fracture.  Incidental right renal calculus noted [JK]  1601 Chest x-ray shows emphysema but no other acute findings [JK]  1652 Troponin I (High Sensitivity)(!) Troponin elevated, similar to previous values [JK]  1653 Comprehensive metabolic panel(!) Labs consistent with chronic kidney disease.  Similar previous values.  Hyperglycemia noted but no acidosis [JK]    Clinical Course User Index [JK] Randol Simmonds, MD   Medical Decision Making Amount and/or Complexity of Data Reviewed Labs: ordered. Decision-making details documented in ED Course. Radiology: ordered.          Ula Prentice SAUNDERS, MD 01/15/24 2010

## 2024-01-15 NOTE — ED Notes (Signed)
Attempted IV and blood draw x2 without success.

## 2024-01-15 NOTE — ED Provider Notes (Signed)
 [place] EMERGENCY DEPARTMENT AT [facility] Provider Note   CSN: [id number] Arrival date & time: 01/15/24  1252     Patient presents with: No chief complaint on file.   Vincent Baxter is a 89 y.o. male.   HPI   Patient denies any significant medical history.  He states he does not see a doctor and does not have any medical problems.  Per medical records patient does have a history of hypertension CHF COPD diabetes chronic back pain, chronic cigarette use.  Patient states he has been having trouble with leg pain ongoing for several months.  Patient states he feels like he has been losing weight.  It is making it harder for him to walk and is fallen at times.  He is not having any trouble with vomiting or diarrhea.  No trouble with his appetite.  He states the pain is primarily down in his legs and it goes up to his waist.  He denies any fevers or chills.  He does note some urinary frequency.  Patient states he is retired from [employer] but has never seen the [facility].  Patient also states he does not see a primary care doctor and has not seen a doctor in years  Prior to Admission medications   Medication Sig Start Date End Date Taking? Authorizing Provider  acetaminophen  (TYLENOL ) 500 MG tablet Take 1 tablet (500 mg total) by mouth every 6 (six) hours as needed. 12/20/22   Randol Simmonds, MD  cephALEXin  (KEFLEX ) 500 MG capsule Take 1 capsule (500 mg total) by mouth 3 (three) times daily. 12/25/22   Haze Lonni JINNY, MD  diclofenac  Sodium (VOLTAREN ) 1 % GEL Apply 4 g topically 4 (four) times daily. Patient not taking: Reported on 12/20/2022 06/21/22   Emil Share, DO  HYDROcodone -acetaminophen  (NORCO/VICODIN) 5-325 MG tablet Take 1 tablet by mouth every 6 (six) hours as needed for moderate pain (pain score 4-6). 07/29/23   Suzette Pac, MD  lidocaine  (LIDODERM ) 5 % Place 1 patch onto the skin daily. Remove & Discard patch within 12 hours or as directed by MD 12/20/22   Randol Simmonds, MD   predniSONE  (DELTASONE ) 20 MG tablet Take 2 tablets (40 mg total) by mouth daily for 3 days. 07/29/23   Suzette Pac, MD  tamsulosin  (FLOMAX ) 0.4 MG CAPS capsule Take 1 capsule (0.4 mg total) by mouth daily. 07/29/23   Suzette Pac, MD    Allergies: Patient has no known allergies.    Review of Systems  Updated Vital Signs BP 119/89 (BP Location: Left Arm)   Pulse 91   Temp 98.2 F (36.8 C) (Oral)   Resp 16   Ht 1.676 m (5' 6)   Wt 50 kg   SpO2 99%   BMI 17.79 kg/m   Physical Exam Vitals and nursing note reviewed.  Constitutional:      Appearance: He is well-developed. He is not diaphoretic.     Comments: Decreased muscle mass  HENT:     Head: Normocephalic and atraumatic.     Right Ear: External ear normal.     Left Ear: External ear normal.  Eyes:     General: No scleral icterus.       Right eye: No discharge.        Left eye: No discharge.     Conjunctiva/sclera: Conjunctivae normal.  Neck:     Trachea: No tracheal deviation.  Cardiovascular:     Rate and Rhythm: Normal rate and regular rhythm.  Pulmonary:     Effort: Pulmonary effort is normal. No respiratory distress.     Breath sounds: Normal breath sounds. No stridor. No wheezing or rales.  Abdominal:     General: Bowel sounds are normal. There is no distension.     Palpations: Abdomen is soft.     Tenderness: There is no abdominal tenderness. There is no guarding or rebound.  Musculoskeletal:        General: No tenderness or deformity.     Cervical back: Neck supple.     Right lower leg: Edema present.     Left lower leg: Edema present.     Comments: No focal areas of tenderness in the spine, mild edema noted bilateral feet and ankles, no cyanosis no erythema, extremities warm and well-perfused  Skin:    General: Skin is warm and dry.     Findings: No rash.  Neurological:     General: No focal deficit present.     Mental Status: He is alert.     Cranial Nerves: No cranial nerve deficit, dysarthria  or facial asymmetry.     Sensory: No sensory deficit.     Motor: No abnormal muscle tone or seizure activity.     Coordination: Coordination normal.  Psychiatric:        Mood and Affect: Mood normal.     (all labs ordered are listed, but only abnormal results are displayed) Labs Reviewed  COMPREHENSIVE METABOLIC PANEL WITH GFR - Abnormal; Notable for the following components:      Result Value   Chloride 112 (*)    CO2 19 (*)    Glucose, Bld 227 (*)    BUN 35 (*)    Creatinine, Ser 1.66 (*)    Calcium  8.8 (*)    Albumin 3.4 (*)    GFR, Estimated 40 (*)    All other components within normal limits  CBC WITH DIFFERENTIAL/PLATELET - Abnormal; Notable for the following components:   RBC 3.71 (*)    Hemoglobin 12.1 (*)    HCT 37.0 (*)    Platelets 128 (*)    All other components within normal limits  TROPONIN I (HIGH SENSITIVITY) - Abnormal; Notable for the following components:   Troponin I (High Sensitivity) 93 (*)    All other components within normal limits  LIPASE, BLOOD  URINALYSIS, ROUTINE W REFLEX MICROSCOPIC  TROPONIN I (HIGH SENSITIVITY)    EKG: None  Radiology: DG Lumbar Spine Complete Result Date: 01/15/2024 CLINICAL DATA:  Weakness and back pain. EXAM: LUMBAR SPINE - COMPLETE 4+ VIEW COMPARISON:  No os on radiograph dated 07/29/2023. FINDINGS: Five lumbar type vertebra. No acute fracture subluxation. The bones are osteopenic. Mild degenerative changes. The visualized posterior elements are intact. Atherosclerotic calcification of the abdominal aorta. Stable positioning of a 5 mm right renal calculus. IMPRESSION: 1. No acute/traumatic lumbar spine pathology. 2. Right renal calculus. Electronically Signed   By: Vanetta Chou M.D.   On: 01/15/2024 15:38   DG Chest 2 View Result Date: 01/15/2024 CLINICAL DATA:  Weakness. EXAM: CHEST - 2 VIEW COMPARISON:  Chest radiograph dated 07/29/2023. FINDINGS: Background of emphysema. No focal consolidation, pleural effusion,  pneumothorax. The cardiac silhouette is within normal limits. No acute osseous pathology. IMPRESSION: 1. No active cardiopulmonary disease. 2. Emphysema. Electronically Signed   By: Vanetta Chou M.D.   On: 01/15/2024 15:37     Procedures   Medications Ordered in the ED - No data to display  Clinical Course as of 01/15/24  1704  Wed Jan 15, 2024  1600 X-ray shows no signs of spinal fracture.  Incidental right renal calculus noted [JK]  1601 Chest x-ray shows emphysema but no other acute findings [JK]  1652 Troponin I (High Sensitivity)(!) Troponin elevated, similar to previous values [JK]  1653 Comprehensive metabolic panel(!) Labs consistent with chronic kidney disease.  Similar previous values.  Hyperglycemia noted but no acidosis [JK]    Clinical Course User Index [JK] Randol Simmonds, MD                                 Medical Decision Making Amount and/or Complexity of Data Reviewed Labs: ordered. Decision-making details documented in ED Course. Radiology: ordered.   Patient presented to ED with various complaints.  He primarily however is having pain in his extremities going up towards his abdominal area.  Patient however is not having any focal abdominal tenderness.  He has not had any nausea or vomiting.  He is able to eat and drink without any difficulty.  Patient has not been following up with a primary care doctor.  His laboratory tests do show evidence of chronic kidney disease as well as hyperglycemia.  High carb slightly decreased but he does not have an anion gap and this was noted on previous labs.  I doubt DKA.  Patient's troponins were also elevated but this is decreased compared to previous values.  I suspect he has a component of CHF causing some of the complaints of swelling.  No respiratory difficulty here.  No pulmonary edema I do not feel he requires IV diuresis.  Suspect his back and leg pain could be related to degenerative changes.  Patient had an MRI in  2023 that showed severe spinal stenosis.  I suspect this causes his chronic leg pain.  Patient however also had findings of an abdominal aneurysm noted back in 2023.  Patient certainly may have a component of peripheral vascular disease causing claudication symptoms.  Patient does not have any acute signs of vascular compromise.  He is comfortable in the ED did not require pain medications.  I stressed the importance of him following up with vascular surgery and cardiology.  Patient states he also tried to establish care with the [facility].     Final diagnoses:  Chronic kidney disease, unspecified CKD stage  Type 2 diabetes mellitus with hyperglycemia, without long-term current use of insulin  (HCC)  Abdominal aortic aneurysm (AAA) without rupture, unspecified part (HCC)  Elevated troponin    ED Discharge Orders          Ordered    Ambulatory referral to Cardiology       Comments: If you have not heard from the Cardiology office within the next 72 hours please call [phone].   01/15/24 1704               Randol Simmonds, MD 01/15/24 1704

## 2024-01-15 NOTE — ED Notes (Signed)
 Pt requesting food on arrival to room

## 2024-01-16 ENCOUNTER — Ambulatory Visit (HOSPITAL_COMMUNITY)
Admission: EM | Admit: 2024-01-16 | Discharge: 2024-01-16 | Disposition: A | Discharge status: Home / Self Care | Attending: Internal Medicine | Admitting: Internal Medicine

## 2024-01-16 ENCOUNTER — Encounter (HOSPITAL_COMMUNITY): Payer: Self-pay | Admitting: *Deleted

## 2024-01-16 DIAGNOSIS — I739 Peripheral vascular disease, unspecified: Secondary | ICD-10-CM

## 2024-01-16 DIAGNOSIS — M79605 Pain in left leg: Secondary | ICD-10-CM

## 2024-01-16 DIAGNOSIS — M545 Low back pain, unspecified: Secondary | ICD-10-CM | POA: Diagnosis not present

## 2024-01-16 DIAGNOSIS — M79604 Pain in right leg: Secondary | ICD-10-CM | POA: Diagnosis not present

## 2024-01-16 MED ORDER — DICLOFENAC SODIUM 1 % EX GEL: 4.0000 g | Freq: Four times a day (QID) | CUTANEOUS | 2 refills | Status: DC

## 2024-01-16 MED ORDER — LIDOCAINE 5 % EX PTCH: 1.0000 | MEDICATED_PATCH | CUTANEOUS | 1 refills | Status: DC

## 2024-01-16 MED ORDER — PREDNISONE 20 MG PO TABS: 40.0000 mg | ORAL_TABLET | Freq: Every day | ORAL | 0 refills | Status: AC

## 2024-01-16 NOTE — ED Provider Notes (Signed)
 [facility]    CSN: [id number] Arrival date & time: 01/16/24  1641      History   Chief Complaint Chief Complaint  Patient presents with   Joint Swelling    HPI Vincent Baxter is a 89 y.o. male.   89 year old male who presents urgent care with complaints of back pain and lower extremity pain.  He reports this has been going on for quite some time.  He seems very frustrated as he was seen in the emergency room last night and feels like he was not given any answers or explained the results of his test.  He reports that the pain is worse when walking but he also notes that pain radiates from his back down his legs.  He does not have a primary care provider.  He denies any bowel or bladder incontinence.  He does report that when he urinates his pain gets better.     Past Medical History:  Diagnosis Date   Diabetes mellitus 01/2009 dx   DYSLIPIDEMIA    GERD    Hypertension    Kidney stones    RENAL CALCULUS 01/04/2009    Patient Active Problem List   Diagnosis Date Noted   Cardiomyopathy (HCC) 04/19/2022   Left ventricular hypertrophy 04/19/2022   Renal mass 03/23/2022   Rib pain 03/23/2022   Bradycardia 03/22/2022   Abnormal EKG    PAC (premature atrial contraction)    PVC (premature ventricular contraction)    Smoking    Right flank pain 05/02/2020   Elevated troponin 05/01/2020   New onset of congestive heart failure (HCC) 05/01/2020   AAA (abdominal aortic aneurysm) (HCC)    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 Authorizing Provider  lidocaine   (LIDODERM ) 5 % Place 1 patch onto the skin daily. Remove & Discard patch within 12 hours or as directed by MD 01/16/24  Yes Teresa Almarie LABOR, PA-C  predniSONE  (DELTASONE ) 20 MG tablet Take 2 tablets (40 mg total) by mouth daily with breakfast for 3 days. 01/16/24 01/19/24 Yes Quinesha Selinger A, PA-C  acetaminophen  (TYLENOL ) 500 MG tablet Take 1 tablet (500 mg total) by mouth every 6 (six) hours as needed. 12/20/22   Randol Simmonds, MD  cephALEXin  (KEFLEX ) 500 MG capsule Take 1 capsule (500 mg total) by mouth 3 (three) times daily. 12/25/22   Haze Lonni JINNY, MD  diclofenac  Sodium (VOLTAREN ) 1 % GEL Apply 4 g topically 4 (four) times daily. 01/16/24   Mirian Casco A, PA-C  HYDROcodone -acetaminophen  (NORCO/VICODIN) 5-325 MG tablet Take 1 tablet by mouth every 6 (six) hours as needed for moderate pain (pain score 4-6). 07/29/23   Suzette Pac, MD  tamsulosin  (FLOMAX ) 0.4 MG CAPS capsule Take 1 capsule (0.4 mg total) by mouth daily. 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   Review of Systems Review of Systems  Constitutional:  Positive for chills (all the time especially in [facility]). Negative for fever.  HENT:  Negative for ear pain and sore throat.   Eyes:  Negative for pain and visual disturbance.  Respiratory:  Negative for cough and shortness of breath.   Cardiovascular:  Negative for chest pain and palpitations.  Gastrointestinal:  Negative for abdominal pain and vomiting.  Genitourinary:  Negative for difficulty urinating, dysuria, frequency and  hematuria.  Musculoskeletal:  Positive for back pain. Negative for arthralgias.       Leg pain with walking  Skin:  Negative for color change and rash.  Neurological:  Negative for dizziness, seizures and syncope.  All other systems reviewed and are negative.    Physical Exam Triage Vital Signs ED Triage Vitals  Encounter Vitals Group     BP 01/16/24 1747 (!) 134/92     Girls Systolic BP Percentile --      Girls Diastolic BP Percentile --      Boys Systolic BP Percentile --      Boys Diastolic BP Percentile --      Pulse Rate 01/16/24 1747 82     Resp 01/16/24 1747 18     Temp 01/16/24 1747 (!) 97.5 F (36.4 C)     Temp Source 01/16/24 1747 Oral     SpO2 01/16/24 1747 95 %     Weight --      Height --      Head Circumference --      Peak Flow --      Pain Score 01/16/24 1745 10     Pain Loc --      Pain Education --      Exclude from Growth Chart --    No data found.  Updated Vital Signs BP (!) 134/92 (BP Location: Left Arm)   Pulse 82   Temp (!) 97.5 F (36.4 C) (Oral)   Resp 18   SpO2 95%   Visual Acuity Right Eye Distance:   Left Eye Distance:   Bilateral Distance:    Right Eye Near:   Left Eye Near:    Bilateral Near:     Physical Exam Vitals and nursing note reviewed.  Constitutional:      General: He is not in acute distress.    Appearance: He is well-developed.  HENT:     Head: Normocephalic and atraumatic.  Eyes:     Conjunctiva/sclera: Conjunctivae normal.  Cardiovascular:     Rate and Rhythm: Normal rate.     Comments: Unable to palpate pedal pulses but the feet are warm and well-perfused with brisk capillary refill Pulmonary:     Effort: Pulmonary effort is normal. No respiratory distress.  Abdominal:     Palpations: Abdomen is soft.     Tenderness: There is no abdominal tenderness.  Musculoskeletal:        General: Swelling present.     Cervical back: Neck supple.     Right lower leg: 1+ Edema present.     Left lower leg: 1+ Edema  present.  Skin:    General: Skin is warm and dry.  Capillary Refill: Capillary refill takes less than 2 seconds.  Neurological:     Mental Status: He is alert.  Psychiatric:        Mood and Affect: Mood normal.      UC Treatments / Results  Labs (all labs ordered are listed, but only abnormal results are displayed) Labs Reviewed - No data to display  EKG   Radiology DG Lumbar Spine Complete Result Date: 01/15/2024 CLINICAL DATA:  Weakness and back pain. EXAM: LUMBAR SPINE - COMPLETE 4+ VIEW COMPARISON:  No os on radiograph dated 07/29/2023. FINDINGS: Five lumbar type vertebra. No acute fracture subluxation. The bones are osteopenic. Mild degenerative changes. The visualized posterior elements are intact. Atherosclerotic calcification of the abdominal aorta. Stable positioning of a 5 mm right renal calculus. IMPRESSION: 1. No acute/traumatic lumbar spine pathology. 2. Right renal calculus. Electronically Signed   By: Vanetta Chou M.D.   On: 01/15/2024 15:38   DG Chest 2 View Result Date: 01/15/2024 CLINICAL DATA:  Weakness. EXAM: CHEST - 2 VIEW COMPARISON:  Chest radiograph dated 07/29/2023. FINDINGS: Background of emphysema. No focal consolidation, pleural effusion, pneumothorax. The cardiac silhouette is within normal limits. No acute osseous pathology. IMPRESSION: 1. No active cardiopulmonary disease. 2. Emphysema. Electronically Signed   By: Vanetta Chou M.D.   On: 01/15/2024 15:37    Procedures Procedures (including critical care time)  Medications Ordered in UC Medications - No data to display  Initial Impression / Assessment and Plan / UC Course  I have reviewed the triage vital signs and the nursing notes.  Pertinent labs & imaging results that were available during my care of the patient were reviewed by me and considered in my medical decision making (see chart for details).     Lumbar back pain  Pain in both lower extremities  Claudication of both  lower extremities (HCC)   Patient presents with lower extremity pain and back pain.  His lower extremity pain is much worse with walking but does have a radiating type component to it from his back.  His lower extremity pain is likely multifocal including arthritic changes causing inflammation along with possible peripheral arterial disease causing claudication.  The patient was seen last night in the emergency room and we reviewed all of his studies at that time.  We discussed these with the patient in the room and told him of the advice that was given in the emergency room including following up with vascular surgery.  We have also recommend that he follow-up with a primary care provider soon as possible due to his multiple chronic health issues.  We can try to treat his lower back pain with a very short course of prednisone  as well as lidocaine  patches and Voltaren  as needed.  We have placed a referral for vascular surgery as the patient needs to follow-up with them for possible PAD claudication and also he has a small abdominal aortic aneurysm.  Advised patient that should his symptoms worsen he should go back to the emergency room or present to urgent care.  Final Clinical Impressions(s) / UC Diagnoses   Final diagnoses:  Lumbar back pain  Pain in both lower extremities  Claudication of both lower extremities (HCC)     Discharge Instructions      Lumbar back pain and lower extremity that could be secondary to arthritic changes causing inflammation but symptoms are also may be associated with some aspect of pherpheral vascular disease. Need to follow up with vascular surgey.  We  can try and improve the inflammatory changes with the following medication: Prednisone  40 mg (2 tablets) once daily for 3 days. Take this in the morning.  This is a steroid to help with inflammation and pain.  Lidocaine  patch to the lower back once daily then remove and apply a new patch as needed for pain. Voltaren   gel apply a 4 g dose 3-4 times daily to the affected areas as needed for pain  We have placed a referral for vascular surgery due to possible peripheral arterial disease with claudication symptoms.  If your symptoms worsen please go to the emergency room for further evaluation.    ED Prescriptions     Medication Sig Dispense Auth. Provider   predniSONE  (DELTASONE ) 20 MG tablet Take 2 tablets (40 mg total) by mouth daily with breakfast for 3 days. 6 tablet Kailin Leu A, PA-C   lidocaine  (LIDODERM ) 5 % Place 1 patch onto the skin daily. Remove & Discard patch within 12 hours or as directed by MD 30 patch Krystyna Cleckley A, PA-C   diclofenac  Sodium (VOLTAREN ) 1 % GEL Apply 4 g topically 4 (four) times daily. 100 g Teresa Almarie LABOR, [place]      PDMP not reviewed this encounter.   Teresa Almarie LABOR DEVONNA 01/16/24 1835

## 2024-01-16 NOTE — Discharge Instructions (Addendum)
 Lumbar back pain and lower extremity that could be secondary to arthritic changes causing inflammation but symptoms are also may be associated with some aspect of pherpheral vascular disease. Need to follow up with vascular surgey.  We can try and improve the inflammatory changes with the following medication: Prednisone  40 mg (2 tablets) once daily for 3 days. Take this in the morning.  This is a steroid to help with inflammation and pain.  Lidocaine  patch to the lower back once daily then remove and apply a new patch as needed for pain. Voltaren  gel apply a 4 g dose 3-4 times daily to the affected areas as needed for pain  We have placed a referral for vascular surgery due to possible peripheral arterial disease with claudication symptoms.  If your symptoms worsen please go to the emergency room for further evaluation.

## 2024-01-16 NOTE — ED Triage Notes (Signed)
 Pt states that he was seen in ED yesterday for swollen ankles and had no answers. 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 He states this has been going on about a week. He states pain is better when he is urinating and once he is done urinating the pain returns.

## 2024-02-14 ENCOUNTER — Encounter (HOSPITAL_COMMUNITY): Payer: Self-pay | Admitting: Emergency Medicine

## 2024-02-14 ENCOUNTER — Emergency Department (HOSPITAL_COMMUNITY)

## 2024-02-14 ENCOUNTER — Emergency Department (HOSPITAL_COMMUNITY)
Admission: EM | Admit: 2024-02-14 | Discharge: 2024-02-14 | Discharge status: Against Medical Advice | Attending: Emergency Medicine | Admitting: Emergency Medicine

## 2024-02-14 ENCOUNTER — Other Ambulatory Visit: Payer: Self-pay

## 2024-02-14 DIAGNOSIS — R350 Frequency of micturition: Secondary | ICD-10-CM | POA: Insufficient documentation

## 2024-02-14 DIAGNOSIS — R531 Weakness: Secondary | ICD-10-CM | POA: Insufficient documentation

## 2024-02-14 DIAGNOSIS — Z5321 Procedure and treatment not carried out due to patient leaving prior to being seen by health care provider: Secondary | ICD-10-CM | POA: Diagnosis not present

## 2024-02-14 NOTE — ED Triage Notes (Signed)
 Patient reports general weakness, bilateral ankle swelling, shortness of breath and bilateral leg soreness. Symptoms began about a week ago. He reports I sleep all the time.

## 2024-02-14 NOTE — ED Notes (Signed)
 Pt refused blood work

## 2024-02-14 NOTE — ED Provider Triage Note (Signed)
 Emergency Medicine Provider Triage Evaluation Note  Vincent Baxter , a 89 y.o. male  was evaluated in triage.  Pt complains of generalized weakness for past week. Endorses some urinary frequency  States I have been here before and never get any answers  Denies CP, SHOB, NVD, fevers  Review of Systems  Positive: See hpi Negative:   Physical Exam  BP (!) 115/93 (BP Location: Left Arm)   Pulse 87   Temp 98.1 F (36.7 C) (Oral)   Resp 16   SpO2 100%  Gen:   Awake, no distress   Resp:  Normal effort. Speaking in full and complete sentences MSK:   Moves extremities without difficulty  Other:  No abd tenderness. No pedal edema.  Motor 5/5 in sensation to first 2 BUE and BLE.  No pronator drift.  No aphasia nor slurred speech  Medical Decision Making  Medically screening exam initiated at 6:56 PM.  Appropriate orders placed.  Vincent Baxter was informed that the remainder of the evaluation will be completed by another provider, this initial triage assessment does not replace that evaluation, and the importance of remaining in the ED until their evaluation is complete.  I recommended patient to obtain labwork, full comprehensive physical exam that would be offered in ED room and that I was a provider who performs MSE. I discussed importance of obtaining lab work however patient refused and reports that he will come back if symptoms worsen.  He is stable at this time and is not in any physical signs of distress.  Motor neurologically intact. A&Ox3 and medically competent to make his own decisions.  Patient got up from chair and walked himself out of hospital without difficulty nor assistance   Vincent Baxter, Vincent Baxter 02/14/24 1859

## 2024-02-24 ENCOUNTER — Emergency Department (HOSPITAL_COMMUNITY)

## 2024-02-24 ENCOUNTER — Encounter (HOSPITAL_COMMUNITY): Payer: Self-pay

## 2024-02-24 ENCOUNTER — Emergency Department (HOSPITAL_COMMUNITY)
Admission: EM | Admit: 2024-02-24 | Discharge: 2024-02-24 | Disposition: A | Discharge status: Home / Self Care | Attending: Emergency Medicine | Admitting: Emergency Medicine

## 2024-02-24 ENCOUNTER — Other Ambulatory Visit: Payer: Self-pay

## 2024-02-24 DIAGNOSIS — E1122 Type 2 diabetes mellitus with diabetic chronic kidney disease: Secondary | ICD-10-CM | POA: Diagnosis not present

## 2024-02-24 DIAGNOSIS — N189 Chronic kidney disease, unspecified: Secondary | ICD-10-CM | POA: Insufficient documentation

## 2024-02-24 DIAGNOSIS — N3000 Acute cystitis without hematuria: Secondary | ICD-10-CM | POA: Insufficient documentation

## 2024-02-24 DIAGNOSIS — M79606 Pain in leg, unspecified: Secondary | ICD-10-CM | POA: Diagnosis present

## 2024-02-24 DIAGNOSIS — I10 Essential (primary) hypertension: Secondary | ICD-10-CM

## 2024-02-24 DIAGNOSIS — J449 Chronic obstructive pulmonary disease, unspecified: Secondary | ICD-10-CM | POA: Diagnosis not present

## 2024-02-24 DIAGNOSIS — I509 Heart failure, unspecified: Secondary | ICD-10-CM | POA: Diagnosis not present

## 2024-02-24 LAB — COMPREHENSIVE METABOLIC PANEL WITH GFR
ALT: 11 U/L (ref 0–44)
AST: 15 U/L (ref 15–41)
Albumin: 3.3 g/dL — ABNORMAL LOW (ref 3.5–5.0)
Alkaline Phosphatase: 78 U/L (ref 38–126)
Anion gap: 14 (ref 5–15)
BUN: 36 mg/dL — ABNORMAL HIGH (ref 8–23)
CO2: 17 mmol/L — ABNORMAL LOW (ref 22–32)
Calcium: 8.9 mg/dL (ref 8.9–10.3)
Chloride: 111 mmol/L (ref 98–111)
Creatinine, Ser: 1.67 mg/dL — ABNORMAL HIGH (ref 0.61–1.24)
GFR, Estimated: 40 mL/min — ABNORMAL LOW (ref >60)
Glucose, Bld: 154 mg/dL — ABNORMAL HIGH (ref 70–99)
Potassium: 4.3 mmol/L (ref 3.5–5.1)
Sodium: 142 mmol/L (ref 135–145)
Total Bilirubin: 1 mg/dL (ref 0.0–1.2)
Total Protein: 6.1 g/dL — ABNORMAL LOW (ref 6.5–8.1)

## 2024-02-24 LAB — URINALYSIS, ROUTINE W REFLEX MICROSCOPIC
Bilirubin Urine: NEGATIVE
Glucose, UA: NEGATIVE mg/dL
Hgb urine dipstick: NEGATIVE
Ketones, ur: NEGATIVE mg/dL
Nitrite: NEGATIVE
Protein, ur: 30 mg/dL — AB
Specific Gravity, Urine: 1.013 (ref 1.005–1.030)
pH: 5 (ref 5.0–8.0)

## 2024-02-24 LAB — CBC
HCT: 37.1 % — ABNORMAL LOW (ref 39.0–52.0)
Hemoglobin: 12.6 g/dL — ABNORMAL LOW (ref 13.0–17.0)
MCH: 33.1 pg (ref 26.0–34.0)
MCHC: 34 g/dL (ref 30.0–36.0)
MCV: 97.4 fL (ref 80.0–100.0)
Platelets: 137 K/uL — ABNORMAL LOW (ref 150–400)
RBC: 3.81 MIL/uL — ABNORMAL LOW (ref 4.22–5.81)
RDW: 13.1 % (ref 11.5–15.5)
WBC: 6.2 K/uL (ref 4.0–10.5)
nRBC: 0 % (ref 0.0–0.2)

## 2024-02-24 MED ORDER — CEPHALEXIN 500 MG PO CAPS
500.0000 mg | ORAL_CAPSULE | Freq: Two times a day (BID) | ORAL | 0 refills | Status: AC
  Filled 2024-02-24: qty 14, 7d supply, fill #0

## 2024-02-24 MED ORDER — AMLODIPINE BESYLATE 5 MG PO TABS
5.0000 mg | ORAL_TABLET | Freq: Every day | ORAL | 1 refills | Status: DC
  Filled 2024-02-24: qty 30, 30d supply, fill #0

## 2024-02-24 MED ORDER — CEPHALEXIN 250 MG PO CAPS
500.0000 mg | ORAL_CAPSULE | Freq: Once | ORAL | Status: AC
  Administered 2024-02-24: 500 mg via ORAL
  Filled 2024-02-24: qty 2

## 2024-02-24 NOTE — ED Notes (Signed)
Beverage provided.

## 2024-02-24 NOTE — ED Notes (Signed)
 Pt states I need something for my cough, right now! This NT educated pt that we can not give medications and that his nurse would be in after shift change.

## 2024-02-24 NOTE — ED Notes (Addendum)
 Pt allowed to drink beverages per MD, urine sample needed

## 2024-02-24 NOTE — ED Provider Notes (Signed)
 [place] EMERGENCY DEPARTMENT AT [facility] Provider Note   CSN: [id number] Arrival date & time: 02/24/24  1426     Patient presents with: Weakness   Vincent Baxter. is a 89 y.o. male.    Weakness  This patient is an 89 year old male, he reportedly has no chronic medical conditions, he does not go to the doctor, he does not have a doctor, he does tend to go to the emergency department occasionally when he is having issues and was diagnosed with an abdominal aneurysm a couple of months ago, he was diagnosed with type 2 diabetes and chronic kidney disease and an elevated troponin but does not take any medicine for it, the patient was last seen in the emergency department a week ago, he was seen a month prior to that, he was complaining of leg pain for several months that was a month and a half ago.  He comes in today stating he has been having pain in his legs for at least a month.  It sounds like the same issue.  He reports that he has some generalized weakness associated with the leg pain but intermittently has some leg swelling as well but states the swelling is completely gone away and he is back to normal appearance today.  He is frustrated by his chronic leg pain.  Interestingly the patient was seen a year prior as well with all of the same conditions including CHF COPD diabetes and a AAA, he was having difficulty with generalized weakness at that time similar to what he has today.  No definite etiology was found  The patient had an echocardiogram in 2023, this showed that the patient had an ejection fraction of 40 to 45% and grade 2 diastolic dysfunction  He specifically denies fevers chills nausea vomiting diarrhea coughing or shortness of breath.  He states he is chronically losing his taste and his smell but is still able to eat.  At 1 time he tells me he is not able to walk at all, the next comment he states he is walking around his apartment to take showers to make his  own food etc.    Prior to Admission medications   Medication Sig Start Date End Date Taking? Authorizing Provider  amLODipine  (NORVASC ) 5 MG tablet Take 1 tablet (5 mg total) by mouth daily. 02/24/24  Yes Cleotilde Rogue, MD  cephALEXin  (KEFLEX ) 500 MG capsule Take 1 capsule (500 mg total) by mouth 2 (two) times daily for 7 days. 02/24/24 03/02/24 Yes Cleotilde Rogue, MD  acetaminophen  (TYLENOL ) 500 MG tablet Take 1 tablet (500 mg total) by mouth every 6 (six) hours as needed. 12/20/22   Randol Simmonds, MD  diclofenac  Sodium (VOLTAREN ) 1 % GEL Apply 4 g topically 4 (four) times daily. 01/16/24   White, Elizabeth A, PA-C  HYDROcodone -acetaminophen  (NORCO/VICODIN) 5-325 MG tablet Take 1 tablet by mouth every 6 (six) hours as needed for moderate pain (pain score 4-6). 07/29/23   Suzette Pac, MD  lidocaine  (LIDODERM ) 5 % Place 1 patch onto the skin daily. Remove & Discard patch within 12 hours or as directed by MD 01/16/24   Teresa Almarie LABOR, PA-C  tamsulosin  (FLOMAX ) 0.4 MG CAPS capsule Take 1 capsule (0.4 mg total) by mouth daily. 07/29/23   Suzette Pac, MD    Allergies: Patient has no known allergies.    Review of Systems  Neurological:  Positive for weakness.  All other systems reviewed and are negative.   Updated Vital  Signs BP (!) 147/107 (BP Location: Right Arm)   Pulse 98   Temp 97.8 F (36.6 C) (Oral)   Resp 14   SpO2 100%   Physical Exam Vitals and nursing note reviewed.  Constitutional:      General: He is not in acute distress.    Appearance: He is well-developed.  HENT:     Head: Normocephalic and atraumatic.     Mouth/Throat:     Pharynx: No oropharyngeal exudate.  Eyes:     General: No scleral icterus.       Right eye: No discharge.        Left eye: No discharge.     Conjunctiva/sclera: Conjunctivae normal.     Pupils: Pupils are equal, round, and reactive to light.  Neck:     Thyroid : No thyromegaly.     Vascular: No JVD.  Cardiovascular:     Rate and Rhythm: Normal  rate and regular rhythm.     Heart sounds: Normal heart sounds. No murmur heard.    No friction rub. No gallop.  Pulmonary:     Effort: Pulmonary effort is normal. No respiratory distress.     Breath sounds: Normal breath sounds. No wheezing or rales.  Abdominal:     General: Bowel sounds are normal. There is no distension.     Palpations: Abdomen is soft. There is no mass.     Tenderness: There is no abdominal tenderness.  Musculoskeletal:        General: No tenderness. Normal range of motion.     Cervical back: Normal range of motion and neck supple.     Comments: Subtle 1+ pitting edema only at the ankles, the pretibial regions are very small in fact he is mildly cachectic diffusely  Lymphadenopathy:     Cervical: No cervical adenopathy.  Skin:    General: Skin is warm and dry.     Findings: No erythema or rash.  Neurological:     Mental Status: He is alert.     Coordination: Coordination normal.  Psychiatric:        Behavior: Behavior normal.     (all labs ordered are listed, but only abnormal results are displayed) Labs Reviewed  COMPREHENSIVE METABOLIC PANEL WITH GFR - Abnormal; Notable for the following components:      Result Value   CO2 17 (*)    Glucose, Bld 154 (*)    BUN 36 (*)    Creatinine, Ser 1.67 (*)    Total Protein 6.1 (*)    Albumin 3.3 (*)    GFR, Estimated 40 (*)    All other components within normal limits  CBC - Abnormal; Notable for the following components:   RBC 3.81 (*)    Hemoglobin 12.6 (*)    HCT 37.1 (*)    Platelets 137 (*)    All other components within normal limits  URINALYSIS, ROUTINE W REFLEX MICROSCOPIC - Abnormal; Notable for the following components:   APPearance HAZY (*)    Protein, ur 30 (*)    Leukocytes,Ua MODERATE (*)    Bacteria, UA MANY (*)    All other components within normal limits  MAGNESIUM     EKG: EKG Interpretation Date/Time:  Monday February 24 2024 19:46:56 EDT Ventricular Rate:  98 PR  Interval:  182 QRS Duration:  92 QT Interval:  351 QTC Calculation: 449 R Axis:   -71  Text Interpretation: Sinus rhythm Left anterior fascicular block Consider anterior infarct Confirmed by Cleotilde Rogue ([id number]) on  02/24/2024 7:52:15 PM  Radiology: [facility]COLA Ribs Unilateral W/Chest Right Result Date: 02/24/2024 CLINICAL DATA:  Rib pain on the right. EXAM: RIGHT RIBS AND CHEST - 3+ VIEW COMPARISON:  02/14/2024 and CT chest 10/31/2021. FINDINGS: Trachea is midline. Heart size stable. Lungs are emphysematous. Streaky scarring in the lung bases. No pleural fluid. Dedicated views of the right ribs show no fracture. IMPRESSION: 1. No acute findings. 2. Emphysema with bibasilar scarring. Electronically Signed   By: Newell Eke M.D.   On: 02/24/2024 17:16     Procedures   Medications Ordered in the ED  cephALEXin  (KEFLEX ) capsule 500 mg (has no administration in time range)                                    Medical Decision Making Amount and/or Complexity of Data Reviewed Labs: ordered. Radiology: ordered. ECG/medicine tests: ordered.  Risk Prescription drug management.   This patient does not appear to be ill at all in person, his laboratory data shows that he has renal insufficiency with a creatinine of 1.6, glucose of 154 although it should be noted that he is actively eating in the room.  He has low albumin, he has a hemoglobin of 12.6 and no leukocytosis.  Waiting on urinalysis  Urinalysis with UTI  He complains of having right lateral rib pain and occasional cough, continues to smoke   Radiology Imaging: I personally viewed the images of the ordered radiographic studies and find emphysema but no acute findings of the ribs or the lungs otherwise  I agree with the radiologist interpretation as well  Home with Keflex  and amlodipine  for blood pressure which was incidentally elevated at 147/107.  Patient agreeable, given local follow-up information     Final diagnoses:  Acute  cystitis without hematuria  Uncontrolled hypertension    ED Discharge Orders          Ordered    cephALEXin  (KEFLEX ) 500 MG capsule  2 times daily        02/24/24 2029    amLODipine  (NORVASC ) 5 MG tablet  Daily        02/24/24 2029               Cleotilde Rogue, MD 02/24/24 2031

## 2024-02-24 NOTE — Discharge Instructions (Addendum)
 Your testing shows that you have a urine infection in your bladder which is probably why you are feeling weak and having pain.  Take Keflex  twice daily for 7 days, I have given you a phone number for a local follow-up you really should be seen in the outpatient setting, your blood pressure is elevated and I am going to prescribe you a medicine for your blood pressure which I would like for you to take once a day to help you prevent having a stroke  Thank you for allowing us  to treat you in the emergency department today.  After reviewing your examination and potential testing that was done it appears that you are safe to go home.  I would like for you to follow-up with your doctor within the next several days, have them obtain your records and follow-up with them to review all potential tests and results from your visit.  If you should develop severe or worsening symptoms return to the emergency department immediately

## 2024-02-24 NOTE — ED Triage Notes (Signed)
 Pt c.o pain all over for the past 2 months. pt unable to elaborate. Pt c.o swelling in his legs with some SOB

## 2024-02-24 NOTE — ED Triage Notes (Signed)
 Pt presents with generalized weakness and body pain x 2 months. 10/10 today

## 2024-02-24 NOTE — ED Notes (Signed)
 Pt called out for a [place] sandwich. Pt stated that he has not eaten all day. NT noted the empty bags of chips at bedside. NT educated pt on being NPO until ED provider places nutrition order.

## 2024-02-25 ENCOUNTER — Other Ambulatory Visit (HOSPITAL_COMMUNITY): Payer: Self-pay

## 2024-03-07 ENCOUNTER — Emergency Department (HOSPITAL_COMMUNITY)
Admission: EM | Admit: 2024-03-07 | Discharge: 2024-03-07 | Disposition: A | Discharge status: Home / Self Care | Attending: Emergency Medicine | Admitting: Emergency Medicine

## 2024-03-07 ENCOUNTER — Encounter (HOSPITAL_COMMUNITY): Payer: Self-pay | Admitting: *Deleted

## 2024-03-07 ENCOUNTER — Emergency Department (HOSPITAL_COMMUNITY)

## 2024-03-07 ENCOUNTER — Other Ambulatory Visit: Payer: Self-pay

## 2024-03-07 DIAGNOSIS — R531 Weakness: Secondary | ICD-10-CM

## 2024-03-07 DIAGNOSIS — M546 Pain in thoracic spine: Secondary | ICD-10-CM | POA: Diagnosis not present

## 2024-03-07 DIAGNOSIS — M545 Low back pain, unspecified: Secondary | ICD-10-CM | POA: Diagnosis not present

## 2024-03-07 DIAGNOSIS — I739 Peripheral vascular disease, unspecified: Secondary | ICD-10-CM | POA: Diagnosis not present

## 2024-03-07 DIAGNOSIS — E1122 Type 2 diabetes mellitus with diabetic chronic kidney disease: Secondary | ICD-10-CM | POA: Insufficient documentation

## 2024-03-07 DIAGNOSIS — J069 Acute upper respiratory infection, unspecified: Secondary | ICD-10-CM | POA: Diagnosis not present

## 2024-03-07 DIAGNOSIS — R059 Cough, unspecified: Secondary | ICD-10-CM | POA: Diagnosis present

## 2024-03-07 DIAGNOSIS — N189 Chronic kidney disease, unspecified: Secondary | ICD-10-CM | POA: Insufficient documentation

## 2024-03-07 LAB — RESP PANEL BY RT-PCR (RSV, FLU A&B, COVID)  RVPGX2
Influenza A by PCR: NEGATIVE
Influenza B by PCR: NEGATIVE
Resp Syncytial Virus by PCR: NEGATIVE
SARS Coronavirus 2 by RT PCR: NEGATIVE

## 2024-03-07 LAB — COMPREHENSIVE METABOLIC PANEL WITH GFR
ALT: 17 U/L (ref 0–44)
AST: 17 U/L (ref 15–41)
Albumin: 3.3 g/dL — ABNORMAL LOW (ref 3.5–5.0)
Alkaline Phosphatase: 77 U/L (ref 38–126)
Anion gap: 9 (ref 5–15)
BUN: 30 mg/dL — ABNORMAL HIGH (ref 8–23)
CO2: 17 mmol/L — ABNORMAL LOW (ref 22–32)
Calcium: 8.8 mg/dL — ABNORMAL LOW (ref 8.9–10.3)
Chloride: 112 mmol/L — ABNORMAL HIGH (ref 98–111)
Creatinine, Ser: 1.82 mg/dL — ABNORMAL HIGH (ref 0.61–1.24)
GFR, Estimated: 36 mL/min — ABNORMAL LOW (ref >60)
Glucose, Bld: 160 mg/dL — ABNORMAL HIGH (ref 70–99)
Potassium: 4.5 mmol/L (ref 3.5–5.1)
Sodium: 138 mmol/L (ref 135–145)
Total Bilirubin: 0.8 mg/dL (ref 0.0–1.2)
Total Protein: 6.2 g/dL — ABNORMAL LOW (ref 6.5–8.1)

## 2024-03-07 LAB — URINALYSIS, ROUTINE W REFLEX MICROSCOPIC
Bacteria, UA: NONE SEEN
Bilirubin Urine: NEGATIVE
Glucose, UA: NEGATIVE mg/dL
Hgb urine dipstick: NEGATIVE
Ketones, ur: NEGATIVE mg/dL
Leukocytes,Ua: NEGATIVE
Nitrite: NEGATIVE
Protein, ur: 30 mg/dL — AB
Specific Gravity, Urine: 1.012 (ref 1.005–1.030)
pH: 5 (ref 5.0–8.0)

## 2024-03-07 LAB — CBC
HCT: 35.2 % — ABNORMAL LOW (ref 39.0–52.0)
Hemoglobin: 12.2 g/dL — ABNORMAL LOW (ref 13.0–17.0)
MCH: 33.8 pg (ref 26.0–34.0)
MCHC: 34.7 g/dL (ref 30.0–36.0)
MCV: 97.5 fL (ref 80.0–100.0)
Platelets: 153 K/uL (ref 150–400)
RBC: 3.61 MIL/uL — ABNORMAL LOW (ref 4.22–5.81)
RDW: 13.5 % (ref 11.5–15.5)
WBC: 8.6 K/uL (ref 4.0–10.5)
nRBC: 0 % (ref 0.0–0.2)

## 2024-03-07 LAB — LIPASE, BLOOD: Lipase: 12 U/L (ref 11–51)

## 2024-03-07 MED ORDER — ACETAMINOPHEN 500 MG PO TABS: 500.0000 mg | ORAL_TABLET | Freq: Four times a day (QID) | ORAL | 0 refills | Status: DC | PRN

## 2024-03-07 MED ORDER — LACTATED RINGERS IV BOLUS
1000.0000 mL | Freq: Once | INTRAVENOUS | Status: AC
  Administered 2024-03-07: 1000 mL via INTRAVENOUS

## 2024-03-07 MED ORDER — ACETAMINOPHEN 500 MG PO TABS
500.0000 mg | ORAL_TABLET | Freq: Four times a day (QID) | ORAL | 0 refills | Status: DC | PRN
  Filled 2024-03-07: qty 30, 8d supply, fill #0

## 2024-03-07 MED ORDER — ACETAMINOPHEN 500 MG PO TABS
1000.0000 mg | ORAL_TABLET | Freq: Once | ORAL | Status: AC
  Administered 2024-03-07: 1000 mg via ORAL
  Filled 2024-03-07: qty 2

## 2024-03-07 MED ORDER — ALBUTEROL SULFATE HFA 108 (90 BASE) MCG/ACT IN AERS
2.0000 | INHALATION_SPRAY | RESPIRATORY_TRACT | Status: DC | PRN
  Administered 2024-03-07: 2 via RESPIRATORY_TRACT
  Filled 2024-03-07: qty 6.7

## 2024-03-07 NOTE — ED Triage Notes (Signed)
 Pt to ER states weakness, soreness from right side under ribs down leg, lower back and bladder.  States can not walk.

## 2024-03-07 NOTE — ED Notes (Addendum)
 Patient seen ambulating to charge nurse desk asking for a wheelchair. Refusing vital signs.

## 2024-03-07 NOTE — Discharge Instructions (Signed)
 You will need to follow-up with the vascular specialist about the circulation in your legs.  Also you were given an inhaler you can use for the cough and cold symptoms you have.  If you start feeling worse return to the emergency room

## 2024-03-07 NOTE — ED Provider Notes (Signed)
 [place] EMERGENCY DEPARTMENT AT [facility] Provider Note   CSN: [id number] Arrival date & time: 03/07/24  1510     Patient presents with: Weakness   Vincent Baxter. is a 89 y.o. male.  {Add pertinent medical, surgical, social history, OB history to HPI:32947} Pt is an 89 year old male who does not go to the doctor or have a PCP, he does tend to go to the emergency department occasionally when he is having issues and was diagnosed with an abdominal aneurysm a couple of months ago, he was diagnosed with type 2 diabetes and chronic kidney disease and an elevated troponin but does not take any medicine for it, the patient was last seen in the emergency department at the being of the month and at that time he was complaining of leg pain for several months.  He comes in today stating he has been having pain in his legs for at least a month but in the last few days he has pain in the right flank area that runs all the way down both legs and his legs are hurting and now he can't walk.  He states he has been losing weight and hasn't eaten anything in a few days because he hasn't had an appetite.  He said he was diagnosed with a bladder infection but reports he is not having any dysuria or frequency at this time.  He reports urinating frequently but feels that he completely empties his bladder.  He has had some swelling in his legs which seem to come and go but is not getting any worse.  His biggest complaint is not being able to walk which he thinks is related to weakness and pain.  He reports the pain keeps him up all night but putting his legs over the side of the bed does not make the pain any better.  He also reports recently having more of a cough that sometimes will produce sputum and just feeling sore all over.  He is not taking anything for this pain at this time.  Patient denies any nausea or vomiting or fever.  He denies any recent falls. Echocardiogram in 2023, this showed that the  patient had an ejection fraction of 40 to 45% and grade 2 diastolic dysfunction  The history is provided by the patient.  Weakness      Prior to Admission medications   Medication Sig Start Date End Date Taking? Authorizing Provider  acetaminophen  (TYLENOL ) 500 MG tablet Take 1 tablet (500 mg total) by mouth every 6 (six) hours as needed. 12/20/22   Randol Simmonds, MD  amLODipine  (NORVASC ) 5 MG tablet Take 1 tablet (5 mg total) by mouth daily. 02/24/24   Cleotilde Rogue, MD  diclofenac  Sodium (VOLTAREN ) 1 % GEL Apply 4 g topically 4 (four) times daily. 01/16/24   Teresa Almarie LABOR, PA-C  HYDROcodone -acetaminophen  (NORCO/VICODIN) 5-325 MG tablet Take 1 tablet by mouth every 6 (six) hours as needed for moderate pain (pain score 4-6). 07/29/23   Suzette Pac, MD  lidocaine  (LIDODERM ) 5 % Place 1 patch onto the skin daily. Remove & Discard patch within 12 hours or as directed by MD 01/16/24   Teresa Almarie LABOR, PA-C  tamsulosin  (FLOMAX ) 0.4 MG CAPS capsule Take 1 capsule (0.4 mg total) by mouth daily. 07/29/23   Suzette Pac, MD    Allergies: Patient has no known allergies.    Review of Systems  Neurological:  Positive for weakness.    Updated Vital Signs BP  127/73 (BP Location: Right Arm)   Pulse 97   Temp 97.6 F (36.4 C)   Resp 18   Ht 5' 6 (1.676 m)   Wt 50 kg   SpO2 96%   BMI 17.79 kg/m   Physical Exam Vitals and nursing note reviewed.  Constitutional:      Appearance: He is underweight.     Comments: Chronically ill-appearing  Cardiovascular:     Rate and Rhythm: Normal rate.  Pulmonary:     Effort: Pulmonary effort is normal.  Abdominal:     Palpations: There is no mass.     Tenderness: There is right CVA tenderness.     Hernia: No hernia is present.  Musculoskeletal:     Cervical back: Neck supple.     Thoracic back: Bony tenderness present.     Lumbar back: Bony tenderness present.     Comments: Slight edema present in bilateral ankles.  Pulses are not palpated and  feet are slightly cool bilaterally but capillary refill is less than 3.  2+ femoral pulse noted bilaterally.  Skin:    Coloration: Skin is pale.  Neurological:     Mental Status: He is alert.     Comments: Patient is able to lift his legs off the bed and bend and flex at the knee and hip.  At this time he reports he is unable to walk.  Upper extremities are normal.     (all labs ordered are listed, but only abnormal results are displayed) Labs Reviewed  COMPREHENSIVE METABOLIC PANEL WITH GFR - Abnormal; Notable for the following components:      Result Value   Chloride 112 (*)    CO2 17 (*)    Glucose, Bld 160 (*)    BUN 30 (*)    Creatinine, Ser 1.82 (*)    Calcium  8.8 (*)    Total Protein 6.2 (*)    Albumin 3.3 (*)    GFR, Estimated 36 (*)    All other components within normal limits  CBC - Abnormal; Notable for the following components:   RBC 3.61 (*)    Hemoglobin 12.2 (*)    HCT 35.2 (*)    All other components within normal limits  URINALYSIS, ROUTINE W REFLEX MICROSCOPIC - Abnormal; Notable for the following components:   Protein, ur 30 (*)    All other components within normal limits  RESP PANEL BY RT-PCR (RSV, FLU A&B, COVID)  RVPGX2  LIPASE, BLOOD    EKG: None  Radiology: [facility] Chest Port 1 View Result Date: 03/07/2024 CLINICAL DATA:  Cough EXAM: PORTABLE CHEST 1 VIEW COMPARISON:  Chest x-ray 02/24/2024. FINDINGS: Heart is enlarged. There are atherosclerotic calcifications of the aorta. Both lungs are clear. There is no pleural effusion or pneumothorax. The visualized skeletal structures are unremarkable. IMPRESSION: 1. No active disease. 2. Cardiomegaly. Electronically Signed   By: Greig Pique M.D.   On: 03/07/2024 18:14    {Document cardiac monitor, telemetry assessment procedure when appropriate:32947} Procedures   Medications Ordered in the ED  lactated ringers  bolus 1,000 mL (1,000 mLs Intravenous New Bag/Given 03/07/24 1756)      {Click here for ABCD2,  HEART and other calculators REFRESH Note before signing:1}                              Medical Decision Making Amount and/or Complexity of Data Reviewed Radiology: ordered.   Pt with multiple medical problems and  comorbidities and presenting today with a complaint that caries a high risk for morbidity and mortality.  Here today with multiple complaints.  Given patient's known medical history, poor compliance and does not have a regular PCP concern for acute change in his AAA that could be causing symptoms versus PAD causing his leg swell versus acute back pathology.  Patient has also had cough and flank pain and concern for possible cancer, pneumonia, COVID, AKI as he has not been eating and drinking well, electrolyte abnormalities.  Lower suspicion for ACS.  I independently interpreted patient's labs and respiratory viral panel is negative, lipase within normal limits, CMP with mild AKI with creatinine of 1.8 from his baseline of 1.6 with normal anion gap, CBC with normal white count and hemoglobin, UA with no acute findings.  I have independently visualized and interpreted pt's images today.  Chest x-ray today within normal limits, radiology reports cardiomegaly.  CT abdomen pelvis for further evaluation radiology reports no acute intra-abdominal or pelvic abnormalities but pay for patient does have an atrophic right kidney with nonobstructing stones in the lower pole as well as a 14 mm partially exophytic hypodensity midpole right kidney which they feel needs a nonemergent ultrasound and a distal infrarenal AAA now measuring 4.8 cm previously 4.4 he needs a follow-up in 12 months.  With Doppler patient's DP and PT pulses were present on the right foot    {Document critical care time when appropriate  Document review of labs and clinical decision tools ie [id number], etc  Document your independent review of radiology images and any outside records  Document your discussion with family members,  caretakers and with consultants  Document social determinants of health affecting pt's care  Document your decision making why or why not admission, treatments were needed:32947:::1}   Final diagnoses:  None    ED Discharge Orders     None

## 2024-03-07 NOTE — ED Notes (Signed)
 Patient transported to CT

## 2024-03-07 NOTE — ED Notes (Signed)
 Patient given albuterol  inhaler to take home. Instructed on proper use.

## 2024-03-07 NOTE — ED Notes (Signed)
 Pt noted to be screaming at staff about blankets and refusing to change into a gown.  Sort NT reports seeing a bedbug on Pt's shirt.

## 2024-03-08 ENCOUNTER — Other Ambulatory Visit (HOSPITAL_COMMUNITY): Payer: Self-pay

## 2024-03-12 ENCOUNTER — Encounter (HOSPITAL_COMMUNITY): Payer: Self-pay

## 2024-03-12 ENCOUNTER — Inpatient Hospital Stay (HOSPITAL_COMMUNITY)
Admission: EM | Admit: 2024-03-12 | Discharge: 2024-03-18 | DRG: 178 | Disposition: A | Discharge status: Home Health | Attending: Family Medicine | Admitting: Family Medicine

## 2024-03-12 ENCOUNTER — Other Ambulatory Visit: Payer: Self-pay

## 2024-03-12 ENCOUNTER — Emergency Department (HOSPITAL_COMMUNITY)

## 2024-03-12 DIAGNOSIS — J189 Pneumonia, unspecified organism: Secondary | ICD-10-CM | POA: Diagnosis not present

## 2024-03-12 DIAGNOSIS — I2489 Other forms of acute ischemic heart disease: Secondary | ICD-10-CM | POA: Diagnosis present

## 2024-03-12 DIAGNOSIS — L89152 Pressure ulcer of sacral region, stage 2: Secondary | ICD-10-CM | POA: Diagnosis present

## 2024-03-12 DIAGNOSIS — Z1152 Encounter for screening for COVID-19: Secondary | ICD-10-CM

## 2024-03-12 DIAGNOSIS — Z8249 Family history of ischemic heart disease and other diseases of the circulatory system: Secondary | ICD-10-CM

## 2024-03-12 DIAGNOSIS — M79604 Pain in right leg: Secondary | ICD-10-CM | POA: Diagnosis present

## 2024-03-12 DIAGNOSIS — F17210 Nicotine dependence, cigarettes, uncomplicated: Secondary | ICD-10-CM | POA: Diagnosis present

## 2024-03-12 DIAGNOSIS — J188 Other pneumonia, unspecified organism: Secondary | ICD-10-CM | POA: Diagnosis not present

## 2024-03-12 DIAGNOSIS — K219 Gastro-esophageal reflux disease without esophagitis: Secondary | ICD-10-CM | POA: Diagnosis present

## 2024-03-12 DIAGNOSIS — N1832 Chronic kidney disease, stage 3b: Secondary | ICD-10-CM | POA: Diagnosis present

## 2024-03-12 DIAGNOSIS — I7143 Infrarenal abdominal aortic aneurysm, without rupture: Secondary | ICD-10-CM | POA: Diagnosis present

## 2024-03-12 DIAGNOSIS — N183 Chronic kidney disease, stage 3 unspecified: Secondary | ICD-10-CM

## 2024-03-12 DIAGNOSIS — J180 Bronchopneumonia, unspecified organism: Secondary | ICD-10-CM | POA: Diagnosis present

## 2024-03-12 DIAGNOSIS — M79605 Pain in left leg: Secondary | ICD-10-CM | POA: Diagnosis present

## 2024-03-12 DIAGNOSIS — I70213 Atherosclerosis of native arteries of extremities with intermittent claudication, bilateral legs: Secondary | ICD-10-CM | POA: Diagnosis present

## 2024-03-12 DIAGNOSIS — Z604 Social exclusion and rejection: Secondary | ICD-10-CM | POA: Diagnosis present

## 2024-03-12 DIAGNOSIS — N400 Enlarged prostate without lower urinary tract symptoms: Secondary | ICD-10-CM | POA: Diagnosis present

## 2024-03-12 DIAGNOSIS — E875 Hyperkalemia: Secondary | ICD-10-CM | POA: Diagnosis present

## 2024-03-12 DIAGNOSIS — I5022 Chronic systolic (congestive) heart failure: Secondary | ICD-10-CM | POA: Diagnosis present

## 2024-03-12 DIAGNOSIS — J690 Pneumonitis due to inhalation of food and vomit: Principal | ICD-10-CM | POA: Diagnosis present

## 2024-03-12 DIAGNOSIS — E119 Type 2 diabetes mellitus without complications: Secondary | ICD-10-CM

## 2024-03-12 DIAGNOSIS — B9789 Other viral agents as the cause of diseases classified elsewhere: Secondary | ICD-10-CM | POA: Diagnosis present

## 2024-03-12 DIAGNOSIS — Z681 Body mass index (BMI) 19 or less, adult: Secondary | ICD-10-CM

## 2024-03-12 DIAGNOSIS — E1151 Type 2 diabetes mellitus with diabetic peripheral angiopathy without gangrene: Secondary | ICD-10-CM | POA: Diagnosis present

## 2024-03-12 DIAGNOSIS — Z87442 Personal history of urinary calculi: Secondary | ICD-10-CM

## 2024-03-12 DIAGNOSIS — J439 Emphysema, unspecified: Secondary | ICD-10-CM | POA: Diagnosis present

## 2024-03-12 DIAGNOSIS — R0789 Other chest pain: Secondary | ICD-10-CM | POA: Diagnosis present

## 2024-03-12 DIAGNOSIS — R1312 Dysphagia, oropharyngeal phase: Secondary | ICD-10-CM

## 2024-03-12 DIAGNOSIS — K551 Chronic vascular disorders of intestine: Secondary | ICD-10-CM | POA: Diagnosis present

## 2024-03-12 DIAGNOSIS — R64 Cachexia: Secondary | ICD-10-CM | POA: Diagnosis present

## 2024-03-12 DIAGNOSIS — I739 Peripheral vascular disease, unspecified: Secondary | ICD-10-CM

## 2024-03-12 DIAGNOSIS — E785 Hyperlipidemia, unspecified: Secondary | ICD-10-CM | POA: Diagnosis present

## 2024-03-12 DIAGNOSIS — E1122 Type 2 diabetes mellitus with diabetic chronic kidney disease: Secondary | ICD-10-CM | POA: Diagnosis present

## 2024-03-12 DIAGNOSIS — J440 Chronic obstructive pulmonary disease with acute lower respiratory infection: Secondary | ICD-10-CM | POA: Diagnosis present

## 2024-03-12 DIAGNOSIS — I130 Hypertensive heart and chronic kidney disease with heart failure and stage 1 through stage 4 chronic kidney disease, or unspecified chronic kidney disease: Secondary | ICD-10-CM | POA: Diagnosis present

## 2024-03-12 DIAGNOSIS — Z79899 Other long term (current) drug therapy: Secondary | ICD-10-CM

## 2024-03-12 DIAGNOSIS — F09 Unspecified mental disorder due to known physiological condition: Secondary | ICD-10-CM | POA: Diagnosis present

## 2024-03-12 LAB — CBC WITH DIFFERENTIAL/PLATELET
Abs Immature Granulocytes: 0.04 K/uL (ref 0.00–0.07)
Basophils Absolute: 0 K/uL (ref 0.0–0.1)
Basophils Relative: 1 %
Eosinophils Absolute: 0.1 K/uL (ref 0.0–0.5)
Eosinophils Relative: 1 %
HCT: 39 % (ref 39.0–52.0)
Hemoglobin: 13.3 g/dL (ref 13.0–17.0)
Immature Granulocytes: 1 %
Lymphocytes Relative: 12 %
Lymphs Abs: 0.8 K/uL (ref 0.7–4.0)
MCH: 33.1 pg (ref 26.0–34.0)
MCHC: 34.1 g/dL (ref 30.0–36.0)
MCV: 97 fL (ref 80.0–100.0)
Monocytes Absolute: 0.4 K/uL (ref 0.1–1.0)
Monocytes Relative: 6 %
Neutro Abs: 5.7 K/uL (ref 1.7–7.7)
Neutrophils Relative %: 79 %
Platelets: 144 K/uL — ABNORMAL LOW (ref 150–400)
RBC: 4.02 MIL/uL — ABNORMAL LOW (ref 4.22–5.81)
RDW: 13.1 % (ref 11.5–15.5)
WBC: 7.1 K/uL (ref 4.0–10.5)
nRBC: 0 % (ref 0.0–0.2)

## 2024-03-12 LAB — COMPREHENSIVE METABOLIC PANEL WITH GFR
ALT: 14 U/L (ref 0–44)
AST: 16 U/L (ref 15–41)
Albumin: 3.5 g/dL (ref 3.5–5.0)
Alkaline Phosphatase: 88 U/L (ref 38–126)
Anion gap: 12 (ref 5–15)
BUN: 47 mg/dL — ABNORMAL HIGH (ref 8–23)
CO2: 19 mmol/L — ABNORMAL LOW (ref 22–32)
Calcium: 9 mg/dL (ref 8.9–10.3)
Chloride: 104 mmol/L (ref 98–111)
Creatinine, Ser: 1.71 mg/dL — ABNORMAL HIGH (ref 0.61–1.24)
GFR, Estimated: 39 mL/min — ABNORMAL LOW (ref >60)
Glucose, Bld: 170 mg/dL — ABNORMAL HIGH (ref 70–99)
Potassium: 5.4 mmol/L — ABNORMAL HIGH (ref 3.5–5.1)
Sodium: 135 mmol/L (ref 135–145)
Total Bilirubin: 0.5 mg/dL (ref 0.0–1.2)
Total Protein: 6.7 g/dL (ref 6.5–8.1)

## 2024-03-12 LAB — URINALYSIS, W/ REFLEX TO CULTURE (INFECTION SUSPECTED)
Bacteria, UA: NONE SEEN
Bilirubin Urine: NEGATIVE
Glucose, UA: NEGATIVE mg/dL
Hgb urine dipstick: NEGATIVE
Ketones, ur: NEGATIVE mg/dL
Leukocytes,Ua: NEGATIVE
Nitrite: NEGATIVE
Protein, ur: NEGATIVE mg/dL
Specific Gravity, Urine: 1.012 (ref 1.005–1.030)
pH: 5 (ref 5.0–8.0)

## 2024-03-12 LAB — LIPASE, BLOOD: Lipase: 20 U/L (ref 11–51)

## 2024-03-12 LAB — BRAIN NATRIURETIC PEPTIDE: B Natriuretic Peptide: 409.4 pg/mL — ABNORMAL HIGH (ref 0.0–100.0)

## 2024-03-12 LAB — TROPONIN I (HIGH SENSITIVITY)
Troponin I (High Sensitivity): 108 ng/L (ref <18)
Troponin I (High Sensitivity): 105 ng/L (ref <18)

## 2024-03-12 LAB — PROTIME-INR
INR: 1 (ref 0.8–1.2)
Prothrombin Time: 13.7 s (ref 11.4–15.2)

## 2024-03-12 LAB — I-STAT CG4 LACTIC ACID, ED
Lactic Acid, Venous: 0.9 mmol/L (ref 0.5–1.9)
Lactic Acid, Venous: 1 mmol/L (ref 0.5–1.9)

## 2024-03-12 MED ORDER — SODIUM CHLORIDE 0.9 % IV SOLN
2.0000 g | Freq: Once | INTRAVENOUS | Status: AC
  Administered 2024-03-12: 2 g via INTRAVENOUS
  Filled 2024-03-12: qty 20

## 2024-03-12 MED ORDER — MORPHINE SULFATE (PF) 4 MG/ML IV SOLN
4.0000 mg | Freq: Once | INTRAVENOUS | Status: AC
  Administered 2024-03-12: 4 mg via INTRAVENOUS
  Filled 2024-03-12: qty 1

## 2024-03-12 MED ORDER — SODIUM CHLORIDE 0.9 % IV SOLN
500.0000 mg | Freq: Once | INTRAVENOUS | Status: AC
  Administered 2024-03-12: 500 mg via INTRAVENOUS
  Filled 2024-03-12: qty 5

## 2024-03-12 MED ORDER — IOHEXOL 350 MG/ML SOLN
75.0000 mL | Freq: Once | INTRAVENOUS | Status: AC | PRN
  Administered 2024-03-12: 75 mL via INTRAVENOUS

## 2024-03-12 MED ORDER — ONDANSETRON HCL 4 MG/2ML IJ SOLN
4.0000 mg | Freq: Once | INTRAMUSCULAR | Status: AC
  Administered 2024-03-12: 4 mg via INTRAVENOUS
  Filled 2024-03-12: qty 2

## 2024-03-12 NOTE — ED Triage Notes (Signed)
 Pt arrives for continued leg pain, not associated with any trauma

## 2024-03-12 NOTE — ED Triage Notes (Signed)
 Patient snapped at this RN because we are not moving fast enough and seems no one cares.  Advised patient I am 1 nurse and moving as fast as I can.  Complains of sob, nasal congestion and cough.  Started last night.  Tried to ask about his medical hx and pain and he snapped again and stating  I have been here many times and this is what is wrong with me now.

## 2024-03-12 NOTE — ED Provider Notes (Signed)
 [place] EMERGENCY DEPARTMENT AT [facility] Provider Note   CSN: [id number] Arrival date & time: 03/12/24  1509     Patient presents with: Leg Pain and Shortness of Breath   Vincent Baxter. is a 89 y.o. male.   89 yo M with a cc of difficulty breathing fatigue and leg pain.  He tells me that he has been having leg pain from his rectum all the way down to his toes.  He said this has been going on for at least 3 months.  Worse when he tries to get up and ambulate.  Denies trauma to the area.  He is frustrated because he has been seen multiple times in the emergency department without obvious change to his symptoms.  He also has been coughing and congested and fatigued for a few days.  Feels like he is having some trouble breathing.  He was unable to stand up this morning.   Leg Pain Shortness of Breath      Prior to Admission medications   Medication Sig Start Date End Date Taking? Authorizing Provider  acetaminophen  (TYLENOL ) 500 MG tablet Take 1 tablet (500 mg total) by mouth every 6 (six) hours as needed for moderate pain (pain score 4-6). 03/07/24   Doretha Folks, MD  amLODipine  (NORVASC ) 5 MG tablet Take 1 tablet (5 mg total) by mouth daily. 02/24/24   Cleotilde Rogue, MD  diclofenac  Sodium (VOLTAREN ) 1 % GEL Apply 4 g topically 4 (four) times daily. 01/16/24   White, Elizabeth A, PA-C  HYDROcodone -acetaminophen  (NORCO/VICODIN) 5-325 MG tablet Take 1 tablet by mouth every 6 (six) hours as needed for moderate pain (pain score 4-6). 07/29/23   Suzette Pac, MD  lidocaine  (LIDODERM ) 5 % Place 1 patch onto the skin daily. Remove & Discard patch within 12 hours or as directed by MD 01/16/24   Teresa Almarie LABOR, PA-C  tamsulosin  (FLOMAX ) 0.4 MG CAPS capsule Take 1 capsule (0.4 mg total) by mouth daily. 07/29/23   Suzette Pac, MD    Allergies: Patient has no known allergies.    Review of Systems  Respiratory:  Positive for shortness of breath.     Updated Vital  Signs BP (!) 124/95   Pulse 82   Temp 97.6 F (36.4 C)   Resp 18   Ht 5' 6 (1.676 m)   Wt 49.9 kg   SpO2 100%   BMI 17.75 kg/m   Physical Exam Vitals and nursing note reviewed.  Constitutional:      Appearance: He is well-developed.  HENT:     Head: Normocephalic and atraumatic.  Eyes:     Pupils: Pupils are equal, round, and reactive to light.  Neck:     Vascular: No JVD.  Cardiovascular:     Rate and Rhythm: Normal rate and regular rhythm.     Heart sounds: No murmur heard.    No friction rub. No gallop.  Pulmonary:     Effort: No respiratory distress.     Breath sounds: No wheezing.  Abdominal:     General: There is no distension.     Tenderness: There is no abdominal tenderness. There is no guarding or rebound.  Musculoskeletal:        General: Normal range of motion.     Cervical back: Normal range of motion and neck supple.     Comments: Feet are cool bilaterally.  Cap refill diminished bilaterally.  He has dopplerable dorsalis pedis pulses bilaterally left has a significantly  diminished waveform compared to right.  Skin:    Coloration: Skin is not pale.     Findings: No rash.  Neurological:     Mental Status: He is alert and oriented to person, place, and time.  Psychiatric:        Behavior: Behavior normal.     (all labs ordered are listed, but only abnormal results are displayed) Labs Reviewed  COMPREHENSIVE METABOLIC PANEL WITH GFR - Abnormal; Notable for the following components:      Result Value   Potassium 5.4 (*)    CO2 19 (*)    Glucose, Bld 170 (*)    BUN 47 (*)    Creatinine, Ser 1.71 (*)    GFR, Estimated 39 (*)    All other components within normal limits  CBC WITH DIFFERENTIAL/PLATELET - Abnormal; Notable for the following components:   RBC 4.02 (*)    Platelets 144 (*)    All other components within normal limits  BRAIN NATRIURETIC PEPTIDE - Abnormal; Notable for the following components:   B Natriuretic Peptide 409.4 (*)    All  other components within normal limits  TROPONIN I (HIGH SENSITIVITY) - Abnormal; Notable for the following components:   Troponin I (High Sensitivity) 108 (*)    All other components within normal limits  TROPONIN I (HIGH SENSITIVITY) - Abnormal; Notable for the following components:   Troponin I (High Sensitivity) 105 (*)    All other components within normal limits  CULTURE, BLOOD (ROUTINE X 2)  CULTURE, BLOOD (ROUTINE X 2)  PROTIME-INR  LIPASE, BLOOD  URINALYSIS, W/ REFLEX TO CULTURE (INFECTION SUSPECTED)  I-STAT CG4 LACTIC ACID, ED  I-STAT CG4 LACTIC ACID, ED    EKG: EKG Interpretation Date/Time:  Thursday March 12 2024 18:22:34 EDT Ventricular Rate:  78 PR Interval:  184 QRS Duration:  74 QT Interval:  380 QTC Calculation: 433 R Axis:   86  Text Interpretation: Normal sinus rhythm Normal ECG Since last tracing rate slower Otherwise no significant change Confirmed by Emil Share [phone]) on 03/12/2024 7:05:59 PM  Radiology: CT Angio Chest/Abd/Pel for Dissection W and/or Wo Contrast Result Date: 03/12/2024 EXAM: CTA CHEST, ABDOMEN AND PELVIS WITH AND WITHOUT IV CONTRAST 03/12/2024 07:31:23 PM TECHNIQUE: CTA of the chest was performed with and without the administration of intravenous contrast. CTA of the abdomen and pelvis was performed with and without the administration of intravenous contrast. Multiplanar reformatted images are provided for review. MIP images are provided for review. Automated exposure control, iterative reconstruction, and/or weight based adjustment of the mA/kV was utilized to reduce the radiation dose to as low as reasonably achievable. 75mL of iohexol  (OMNIPAQUE ) 350 MG/ML injection was used. COMPARISON: Comparison to CT examination on 03/07/2024. CLINICAL HISTORY: Aortic atherosclerosis; sob. FINDINGS: VASCULATURE: AORTA: Mild atherosclerotic calcification within the thoracic aorta. Fusiform infrarenal abdominal aortic aneurysm is present measuring 4.2 x 4.9  cm, stable since prior examination, with circumferential mural thrombus within the aneurysm sac. The aneurysm terminates at the aortic bifurcation. Moderate superimposed aortic iliac atherosclerotic calcification. PULMONARY ARTERIES: No intraluminal filling defect identified to suggest acute or chronic pulmonary embolism. Adequate opacification of the pulmonary arterial tree. GREAT VESSELS OF AORTIC ARCH: The arch vasculature demonstrates conventional anatomic configuration. Less than 50% stenosis of the left subclavian artery at its origin. 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 Celiac axis and superior mesenteric artery are diminutive but otherwise widely patent. SUPERIOR MESENTERIC ARTERY: Short segment 50% stenosis of the proximal superior  mesenteric artery. 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 RENAL ARTERIES: Right renal artery is diminutive in keeping with the atrophic right kidney. Greater than 50% stenosis of the mid left renal artery ( 146/10 ) ILIAC ARTERIES: Moderate superimposed aortic iliac atherosclerotic calcification. CHEST: MEDIASTINUM: Mild coronary artery calcification. Calcification of the aortic valve leaflets. Global cardiac size within normal limits. No pericardial effusion. LUNGS AND PLEURA: Advanced emphysema. Bronchial wall thickening noted in keeping with airway inflammation. Since the prior examination, there is increasing bibasilar bronchial wall thickening in keeping with progressive airway inflammation, as well as extensive airway impaction and peripheral consolidation which may reflect changes of acute bronchopneumonia or aspiration. No pneumothorax or pleural effusion. THORACIC BONES AND SOFT TISSUES: No acute bone or soft tissue abnormality. Marked body wall wasting. ABDOMEN AND PELVIS: LIVER: The liver is unremarkable. GALLBLADDER AND BILE DUCTS: Gallbladder  is unremarkable. No biliary ductal dilatation. SPLEEN: The spleen is unremarkable. PANCREAS: The pancreas is unremarkable. ADRENAL GLANDS: Bilateral adrenal glands demonstrate no acute abnormality. KIDNEYS, URETERS AND BLADDER: Marked asymmetric atrophy of the right kidney. Nonobstructing 13 mm calculus noted within the lower pole of the right kidney. The left kidney is unremarkable. Bladder is unremarkable. GI AND BOWEL: The stomach, small bowel, and large bowel are otherwise unremarkable. There is relatively poor delineation of the visceral structures and bowel within the abdomen due to the early phase of contrast administration and marked wasting with paucity of intra-abdominal fat . REPRODUCTIVE: Reproductive organs are unremarkable. PERITONEUM AND RETROPERITONEUM: No ascites or free air. LYMPH NODES: No lymphadenopathy. ABDOMINAL BONES AND SOFT TISSUES: No acute abnormality of the bones. Marked body wall wasting and loss of retroperitoneal and intraabdominal fat . Osseous structures are age appropriate. No acute bone abnormality. No lytic or blastic bone lesion. IMPRESSION: 1. No evidence of pulmonary embolism. 2. Progressive airway inflammation and extensive bibasilar airway impaction and peripheral consolidation in keeping with multifocal bronchopneumonia or aspiration. 3. Stable fusiform infrarenal abdominal aortic aneurysm measuring 4.2 x 4.9 cm with circumferential mural thrombus. No aortic dissection. 4. 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 5. Marked asymmetric atrophy of the right kidney 6. 50% stenosis of the mid left renal artery 7. Advanced emphysema. Marked body wall wasting and loss of retroperitoneal and intraabdominal fat. Electronically signed by: Dorethia Molt MD 03/12/2024 07:55 PM EDT RP Workstation: [id number]   DG Chest 2 View Result Date: 03/12/2024 CLINICAL DATA:   Question of sepsis to evaluate for abnormality. EXAM: CHEST - 2 VIEW COMPARISON:  03/07/2024 FINDINGS: Mild cardiac enlargement. No vascular congestion, edema, or consolidation. Emphysematous changes in the lungs. Peribronchial thickening with central interstitial pattern suggesting chronic bronchitis. No pleural effusion or pneumothorax. Mediastinal contours appear intact. Calcification of the aorta. Degenerative changes in the spine. IMPRESSION: Emphysematous and chronic bronchitic changes in the lungs. No focal consolidation. Mild cardiac enlargement Electronically Signed   By: Elsie Gravely M.D.   On: 03/12/2024 18:14     Procedures   Medications Ordered in the ED  azithromycin  (ZITHROMAX ) 500 mg in sodium chloride  0.9 % 250 mL IVPB (500 mg Intravenous New Bag/Given 03/12/24 2146)  iohexol  (OMNIPAQUE ) 350 MG/ML injection 75 mL (75 mLs Intravenous Contrast Given 03/12/24 1931)  cefTRIAXone  (ROCEPHIN ) 2 g in sodium chloride  0.9 % 100 mL IVPB (0 g Intravenous Stopped 03/12/24 2142)  Medical Decision Making  89 yo M with a chief complaints of bilateral leg pain fatigue and difficulty breathing.  Said that he has had trouble with leg pain for about 3 months now.  He has been seen multiple times in the ED setting and it seems the most likely diagnosis would be claudication.  He has not yet been able to follow-up with vascular surgery.  On my exam I cannot palpate a pulse but he has dopplerable pulses bilaterally.  Waveform on the left is significantly worse than the right.  I will order arterial Doppler studies here.  Patient is cachectic, there is some concern for underlying cancer.  He had a CT scan chest abdomen pelvis which did not show obvious cancer but did show concern for pneumonia which does fit with his cough congestion and fatigue.  I will start him on community-acquired pneumonia antibiotics.  I discussed case with hospitalist for admission.  The  patients results and plan were reviewed and discussed.   Any x-rays performed were independently reviewed by myself.   Differential diagnosis were considered with the presenting HPI.  Medications  azithromycin  (ZITHROMAX ) 500 mg in sodium chloride  0.9 % 250 mL IVPB (500 mg Intravenous New Bag/Given 03/12/24 2146)  iohexol  (OMNIPAQUE ) 350 MG/ML injection 75 mL (75 mLs Intravenous Contrast Given 03/12/24 1931)  cefTRIAXone  (ROCEPHIN ) 2 g in sodium chloride  0.9 % 100 mL IVPB (0 g Intravenous Stopped 03/12/24 2142)    Vitals:   03/12/24 1930 03/12/24 2000 03/12/24 2015 03/12/24 2021  BP: (!) 139/95 (!) 143/96 (!) 124/95   Pulse:   82   Resp:  17 18   Temp:    97.6 F (36.4 C)  SpO2:   100%   Weight:      Height:        Final diagnoses:  Multifocal pneumonia    Admission/ observation were discussed with the admitting physician, patient and/or family and they are comfortable with the plan.        Final diagnoses:  Multifocal pneumonia    ED Discharge Orders     None          Emil Share, [place] 03/12/24 2153

## 2024-03-12 NOTE — ED Notes (Signed)
 In to collect blood from pt. Pt stated that he needs a way to communicate with the outside. Told pt that there is a phone inside of the room and asked if he needed the phone at the moment. Pt then stated that is the phone supposed to walk and  and I'm supposed to be stupid. Explained to pt that I did not think that he was stupid at all, informed pt that I am trying to communicate with him and be polite. Pt is very rude to this RN.

## 2024-03-12 NOTE — ED Provider Triage Note (Signed)
 Emergency Medicine Provider Triage Evaluation Note  Vincent Baxter. , a 89 y.o. male  was evaluated in triage.  Pt complains of unexplained weight loss severe weakness and inability to stand.  He has been here multiple times for the same.  He has had profound weight loss unintentional over the last 4 months can no longer walk.  Review of EMR shows that in 2023 patient had scattered nodules in the chest concerning for potential cancer I do not see that he has had a follow-up image.  He had a CT abdomen pelvis that were negative recently.  Patient reports he has no appetite and has not eaten in the last 6 days..  Review of Systems  Positive: Explained weight loss profound weakness Negative:   Physical Exam  BP 114/75   Pulse 92   Temp 97.8 F (36.6 C)   Resp 16   Ht 5' 6 (1.676 m)   Wt 49.9 kg   SpO2 94%   BMI 17.75 kg/m  Gen:   Awake, underweight Resp:  Normal effort  MSK:   Moves extremities without difficulty  Other:    Medical Decision Making  Medically screening exam initiated at 4:58 PM.  Appropriate orders placed.  Vincent Baxter. was informed that the remainder of the evaluation will be completed by another provider, this initial triage assessment does not replace that evaluation, and the importance of remaining in the ED until their evaluation is complete.     Vincent Chroman, PA-C 03/12/24 1659

## 2024-03-12 NOTE — H&P (Incomplete)
 History and Physical    Vincent Baxter Vicci Raddle. FMW:[id number] DOB: 08-Jul-1934 DOA: 03/12/2024  PCP: [facility]  Patient coming from: Home  Chief Complaint: Shortness of breath  HPI: Nickholas Goldston. is a 89 y.o. male with medical history significant of hypertension, HFmrEF, COPD, type 2 diabetes, renal mass, nephrolithiasis, AAA, CKD stage IIIb, anemia, GERD, THC and tobacco abuse, BPH presented to ED with complaints of shortness of breath, cough, fatigue, weight loss, and bilateral leg pain.  Vital signs stable.  EKG showing sinus rhythm and no acute ischemic changes.  Labs showing no leukocytosis, hemoglobin normal, platelet count 144k (chronically low and no significant change from baseline), potassium 5.4, bicarb 19 (chronically low and stable), anion gap 12, glucose 170, BUN 47, creatinine 1.7 (stable), normal lipase and LFTs, lactic acid normal x 2, blood cultures in process, troponin 108> 105, BNP 409, UA not suggestive of infection.    CT angiogram chest/abdomen/pelvis showing: IMPRESSION: 1. No evidence of pulmonary embolism. 2. Progressive airway inflammation and extensive bibasilar airway impaction and peripheral consolidation in keeping with multifocal bronchopneumonia or aspiration. 3. Stable fusiform infrarenal abdominal aortic aneurysm measuring 4.2 x 4.9 cm with circumferential mural thrombus. No aortic dissection. 4. 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 5. Marked asymmetric atrophy of the right kidney 6. 50% stenosis of the mid left renal artery 7. Advanced emphysema. Marked body wall wasting and loss of retroperitoneal and intraabdominal fat.  Pedal pulses could not be palpated on exam but patient had dopplerable dorsalis pedis pulses bilaterally, left had significantly diminished waveform compared to right.  Bilateral lower extremity  arterial duplex study ordered.  Patient was given morphine , Zofran , ceftriaxone , and azithromycin  in the ED.  TRH called to admit.  ED Course: ***  Review of Systems:  ROS  Past Medical History:  Diagnosis Date  . Diabetes mellitus 01/2009 dx  . DYSLIPIDEMIA   . GERD   . Hypertension   . Kidney stones   . RENAL CALCULUS 01/04/2009    Past Surgical History:  Procedure Laterality Date  . NO PAST SURGERIES       reports that he has been smoking cigarettes. He has never used smokeless tobacco. He reports that he does not drink alcohol and does not use drugs.  No Known Allergies  Family History  Problem Relation Age of Onset  . Hypertension Mother     Prior to Admission medications   Medication Sig Start Date End Date Taking? Authorizing Provider  acetaminophen  (TYLENOL ) 500 MG tablet Take 1 tablet (500 mg total) by mouth every 6 (six) hours as needed for moderate pain (pain score 4-6). 03/07/24   Doretha Folks, MD  amLODipine  (NORVASC ) 5 MG tablet Take 1 tablet (5 mg total) by mouth daily. 02/24/24   Cleotilde Rogue, MD  diclofenac  Sodium (VOLTAREN ) 1 % GEL Apply 4 g topically 4 (four) times daily. 01/16/24   White, Elizabeth A, PA-C  HYDROcodone -acetaminophen  (NORCO/VICODIN) 5-325 MG tablet Take 1 tablet by mouth every 6 (six) hours as needed for moderate pain (pain score 4-6). 07/29/23   Suzette Pac, MD  lidocaine  (LIDODERM ) 5 % Place 1 patch onto the skin daily. Remove & Discard patch within 12 hours or as directed by MD 01/16/24   Teresa Almarie LABOR, PA-C  tamsulosin  (FLOMAX ) 0.4 MG CAPS capsule Take 1 capsule (0.4 mg total) by mouth daily. 07/29/23   Suzette Pac, MD  Physical Exam: Vitals:   03/12/24 1930 03/12/24 2000 03/12/24 2015 03/12/24 2021  BP: (!) 139/95 (!) 143/96 (!) 124/95   Pulse:   82   Resp:  17 18   Temp:    97.6 F (36.4 C)  SpO2:   100%   Weight:      Height:        Physical Exam  Labs on Admission: I have personally reviewed following labs  and imaging studies  CBC: Recent Labs  Lab 03/07/24 1531 03/12/24 1710  WBC 8.6 7.1  NEUTROABS  --  5.7  HGB 12.2* 13.3  HCT 35.2* 39.0  MCV 97.5 97.0  PLT 153 144*   Basic Metabolic Panel: Recent Labs  Lab 03/07/24 1531 03/12/24 1710  NA 138 135  K 4.5 5.4*  CL 112* 104  CO2 17* 19*  GLUCOSE 160* 170*  BUN 30* 47*  CREATININE 1.82* 1.71*  CALCIUM  8.8* 9.0   GFR: Estimated Creatinine Clearance: 21.9 mL/min (A) (by C-G formula based on SCr of 1.71 mg/dL (H)). Liver Function Tests: Recent Labs  Lab 03/07/24 1531 03/12/24 1710  AST 17 16  ALT 17 14  ALKPHOS 77 88  BILITOT 0.8 0.5  PROT 6.2* 6.7  ALBUMIN 3.3* 3.5   Recent Labs  Lab 03/07/24 1531 03/12/24 1710  LIPASE 12 20   No results for input(s): AMMONIA in the last 168 hours. Coagulation Profile: Recent Labs  Lab 03/12/24 1710  INR 1.0   Cardiac Enzymes: No results for input(s): CKTOTAL, CKMB, CKMBINDEX, TROPONINI in the last 168 hours. BNP (last 3 results) No results for input(s): PROBNP in the last 8760 hours. HbA1C: No results for input(s): HGBA1C in the last 72 hours. CBG: No results for input(s): GLUCAP in the last 168 hours. Lipid Profile: No results for input(s): CHOL, HDL, LDLCALC, TRIG, CHOLHDL, LDLDIRECT in the last 72 hours. Thyroid  Function Tests: No results for input(s): TSH, T4TOTAL, FREET4, T3FREE, THYROIDAB in the last 72 hours. Anemia Panel: No results for input(s): VITAMINB12, FOLATE, FERRITIN, TIBC, IRON, RETICCTPCT in the last 72 hours. Urine analysis:    Component Value Date/Time   COLORURINE YELLOW 03/12/2024 2002   APPEARANCEUR CLEAR 03/12/2024 2002   LABSPEC 1.012 03/12/2024 2002   PHURINE 5.0 03/12/2024 2002   GLUCOSEU NEGATIVE 03/12/2024 2002   GLUCOSEU NEGATIVE 02/11/2012 0931   HGBUR NEGATIVE 03/12/2024 2002   BILIRUBINUR NEGATIVE 03/12/2024 2002   KETONESUR NEGATIVE 03/12/2024 2002   PROTEINUR NEGATIVE  03/12/2024 2002   UROBILINOGEN 0.2 06/20/2022 1043   NITRITE NEGATIVE 03/12/2024 2002   LEUKOCYTESUR NEGATIVE 03/12/2024 2002    Radiological Exams on Admission: CT Angio Chest/Abd/Pel for Dissection W and/or Wo Contrast Result Date: 03/12/2024 EXAM: CTA CHEST, ABDOMEN AND PELVIS WITH AND WITHOUT IV CONTRAST 03/12/2024 07:31:23 PM TECHNIQUE: CTA of the chest was performed with and without the administration of intravenous contrast. CTA of the abdomen and pelvis was performed with and without the administration of intravenous contrast. Multiplanar reformatted images are provided for review. MIP images are provided for review. Automated exposure control, iterative reconstruction, and/or weight based adjustment of the mA/kV was utilized to reduce the radiation dose to as low as reasonably achievable. 75mL of iohexol  (OMNIPAQUE ) 350 MG/ML injection was used. COMPARISON: Comparison to CT examination on 03/07/2024. CLINICAL HISTORY: Aortic atherosclerosis; sob. FINDINGS: VASCULATURE: AORTA: Mild atherosclerotic calcification within the thoracic aorta. Fusiform infrarenal abdominal aortic aneurysm is present measuring 4.2 x 4.9 cm, stable since prior examination, with circumferential mural thrombus within the aneurysm sac.  The aneurysm terminates at the aortic bifurcation. Moderate superimposed aortic iliac atherosclerotic calcification. PULMONARY ARTERIES: No intraluminal filling defect identified to suggest acute or chronic pulmonary embolism. Adequate opacification of the pulmonary arterial tree. GREAT VESSELS OF AORTIC ARCH: The arch vasculature demonstrates conventional anatomic configuration. Less than 50% stenosis of the left subclavian artery at its origin. 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 Celiac axis and superior mesenteric artery are diminutive but otherwise widely patent. SUPERIOR MESENTERIC ARTERY: Short segment 50% stenosis of the proximal superior  mesenteric artery. 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 RENAL ARTERIES: Right renal artery is diminutive in keeping with the atrophic right kidney. Greater than 50% stenosis of the mid left renal artery ( 146/10 ) ILIAC ARTERIES: Moderate superimposed aortic iliac atherosclerotic calcification. CHEST: MEDIASTINUM: Mild coronary artery calcification. Calcification of the aortic valve leaflets. Global cardiac size within normal limits. No pericardial effusion. LUNGS AND PLEURA: Advanced emphysema. Bronchial wall thickening noted in keeping with airway inflammation. Since the prior examination, there is increasing bibasilar bronchial wall thickening in keeping with progressive airway inflammation, as well as extensive airway impaction and peripheral consolidation which may reflect changes of acute bronchopneumonia or aspiration. No pneumothorax or pleural effusion. THORACIC BONES AND SOFT TISSUES: No acute bone or soft tissue abnormality. Marked body wall wasting. ABDOMEN AND PELVIS: LIVER: The liver is unremarkable. GALLBLADDER AND BILE DUCTS: Gallbladder is unremarkable. No biliary ductal dilatation. SPLEEN: The spleen is unremarkable. PANCREAS: The pancreas is unremarkable. ADRENAL GLANDS: Bilateral adrenal glands demonstrate no acute abnormality. KIDNEYS, URETERS AND BLADDER: Marked asymmetric atrophy of the right kidney. Nonobstructing 13 mm calculus noted within the lower pole of the right kidney. The left kidney is unremarkable. Bladder is unremarkable. GI AND BOWEL: The stomach, small bowel, and large bowel are otherwise unremarkable. There is relatively poor delineation of the visceral structures and bowel within the abdomen due to the early phase of contrast administration and marked wasting with paucity of intra-abdominal fat . REPRODUCTIVE: Reproductive organs are unremarkable. PERITONEUM AND RETROPERITONEUM: No ascites or free  air. LYMPH NODES: No lymphadenopathy. ABDOMINAL BONES AND SOFT TISSUES: No acute abnormality of the bones. Marked body wall wasting and loss of retroperitoneal and intraabdominal fat . Osseous structures are age appropriate. No acute bone abnormality. No lytic or blastic bone lesion. IMPRESSION: 1. No evidence of pulmonary embolism. 2. Progressive airway inflammation and extensive bibasilar airway impaction and peripheral consolidation in keeping with multifocal bronchopneumonia or aspiration. 3. Stable fusiform infrarenal abdominal aortic aneurysm measuring 4.2 x 4.9 cm with circumferential mural thrombus. No aortic dissection. 4. 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 5. Marked asymmetric atrophy of the right kidney 6. 50% stenosis of the mid left renal artery 7. Advanced emphysema. Marked body wall wasting and loss of retroperitoneal and intraabdominal fat. Electronically signed by: Dorethia Molt MD 03/12/2024 07:55 PM EDT RP Workstation: [id number]   DG Chest 2 View Result Date: 03/12/2024 CLINICAL DATA:  Question of sepsis to evaluate for abnormality. EXAM: CHEST - 2 VIEW COMPARISON:  03/07/2024 FINDINGS: Mild cardiac enlargement. No vascular congestion, edema, or consolidation. Emphysematous changes in the lungs. Peribronchial thickening with central interstitial pattern suggesting chronic bronchitis. No pleural effusion or pneumothorax. Mediastinal contours appear intact. Calcification of the aorta. Degenerative changes in the spine. IMPRESSION: Emphysematous and chronic bronchitic changes in the lungs. No focal consolidation.  Mild cardiac enlargement Electronically Signed   By: Elsie Gravely M.D.   On: 03/12/2024 18:14    EKG: Independently reviewed.  Sinus rhythm, baseline wander in V4 and V5.  No significant change since previous tracing.  Assessment and Plan  Multifocal  pneumonia  CT showing progressive airway inflammation and extensive bibasilar airway impaction and peripheral consolidation suspicious for multifocal bronchopneumonia versus aspiration.  No fever or leukocytosis.  No signs of sepsis.  Patient is not hypoxic.  Continue ceftriaxone  and azithromycin .  MRSA PCR screen.  Strep pneumo/Legionella urinary antigens.  Test for COVID/flu/RSV.  Bilateral leg pain Suspect due to PAD/claudication.  Pedal pulses could not be palpated on exam but patient had dopplerable dorsalis pedis pulses bilaterally, left had significantly diminished waveform compared to right.  Bilateral lower extremity arterial duplex study ordered.  CT angiogram abdomen pelvis showing additional findings including short segment 50% stenosis of the proximal superior mesenteric artery and the inferior mesenteric artery appears occluded at its origin and is weakly reconstituted.  Onsult vascular surgery in the morning.    Last echo done in October 2023 showing EF 40 to 45%, grade 2 diastolic dysfunction, severe concentric LVH concerning for possible HCM versus amyloidosis, moderate biatrial dilation, trivial mitral regurgitation.  hypertension, HFmrEF, COPD, type 2 diabetes, renal mass, nephrolithiasis, AAA, CKD stage IIIb, anemia, GERD, THC and tobacco abuse, BPH presented to ED with complaints of shortness of breath, cough, fatigue, weight loss, and bilateral leg pain.  EKG showing sinus rhythm and no acute ischemic changes.    hemoglobin normal, platelet count 144k (chronically low and no significant change from baseline), potassium 5.4, bicarb 19 (chronically low and stable), anion gap 12, glucose 170, BUN 47, creatinine 1.7 (stable), normal lipase and LFTs, lactic acid normal x 2, blood cultures in process, troponin 108> 105, BNP 409, UA not suggestive of infection.      6. 50% stenosis of the mid left renal artery 7. Advanced emphysema. Marked body wall wasting and loss of retroperitoneal  and intraabdominal fat.   Patient was given morphine , Zofran , ceftriaxone , and azithromycin  in the ED.  [facility] called to admit.  DVT prophylaxis: {Blank single:19197::Lovenox ,SQ Heparin ,IV heparin  gtt,Xarelto,Eliquis,Coumadin,SCDs,***} Code Status: {Blank single:19197::Full Code,DNR,DNR/DNI,Comfort Care,***} Family Communication: ***  Consults called: ***  Level of care: {Blank single:19197::Med-Surg,Telemetry bed,Progressive Care Unit,Step Down Unit} Admission status: *** Time Spent: 75+ minutes***  Editha Ram MD [facility]  If 7PM-7AM, please contact night-coverage [email or web address]  03/12/2024, 11:27 PM

## 2024-03-12 NOTE — ED Notes (Signed)
 Patient does not want to get into a gown. Very snappy during initial encounter.

## 2024-03-12 NOTE — ED Notes (Signed)
 Pt provided warm blankets. Pt says he is comfortable at this time.

## 2024-03-12 NOTE — H&P (Signed)
 History and Physical    Vincent Baxter. FMW:[id number] DOB: [birth year] DOA: 03/12/2024  PCP: [facility]  Patient coming from: Home  Chief Complaint: Right-sided rib pain  HPI: Vincent Baxter. is a 89 y.o. male with medical history significant of hypertension, HFmrEF, COPD, type 2 diabetes, renal mass, nephrolithiasis, AAA, CKD stage IIIb, anemia, GERD, THC and tobacco abuse, BPH presenting with a chief complaint of right-sided lateral rib pain which has been ongoing for several months.  He denies any recent falls or trauma.  He is also reporting shortness of breath and cough for several weeks.  Denies fevers.  Patient states he is not able to taste food and has lost his appetite over the past few months.  Reports losing weight but he is not sure how much.  He is also endorsing bilateral leg pain which is worse with exertion x 3 months.  No other complaints.  ED course: Vital signs stable.  EKG showing sinus rhythm and no acute ischemic changes.  Labs showing no leukocytosis, hemoglobin normal, platelet count 144k (chronically low and no significant change from baseline), potassium 5.4, bicarb 19 (chronically low and stable), anion gap 12, glucose 170, BUN 47, creatinine 1.7 (stable), normal lipase and LFTs, lactic acid normal x 2, blood cultures in process, troponin 108> 105, BNP 409, UA not suggestive of infection.    CT angiogram chest/abdomen/pelvis showing: IMPRESSION: 1. No evidence of pulmonary embolism. 2. Progressive airway inflammation and extensive bibasilar airway impaction and peripheral consolidation in keeping with multifocal bronchopneumonia or aspiration. 3. Stable fusiform infrarenal abdominal aortic aneurysm measuring 4.2 x 4.9 cm with circumferential mural thrombus. No aortic dissection. 4. 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 5.  Marked asymmetric atrophy of the right kidney 6. 50% stenosis of the mid left renal artery 7. Advanced emphysema. Marked body wall wasting and loss of retroperitoneal and intraabdominal fat.  Pedal pulses could not be palpated on exam but patient had dopplerable dorsalis pedis pulses bilaterally, left had significantly diminished waveform compared to right.  Bilateral lower extremity arterial duplex study ordered.  Patient was given morphine , Zofran , ceftriaxone , and azithromycin  in the ED.  TRH called to admit.  Review of Systems:  Review of Systems  All other systems reviewed and are negative.   Past Medical History:  Diagnosis Date   Diabetes mellitus 01/2009 dx   DYSLIPIDEMIA    GERD    Hypertension    Kidney stones    RENAL CALCULUS 01/04/2009    Past Surgical History:  Procedure Laterality Date   NO PAST SURGERIES       reports that he has been smoking cigarettes. He has never used smokeless tobacco. He reports that he does not drink alcohol and does not use drugs.  No Known Allergies  Family History  Problem Relation Age of Onset   Hypertension Mother     Prior to Admission medications   Medication Sig Start Date End Date Taking? Authorizing Provider  acetaminophen  (TYLENOL ) 500 MG tablet Take 1 tablet (500 mg total) by mouth every 6 (six) hours as needed for moderate pain (pain score 4-6). 03/07/24   Doretha Folks, MD  amLODipine  (NORVASC ) 5 MG tablet Take 1 tablet (5 mg total) by mouth daily. 02/24/24   Cleotilde Rogue, MD  diclofenac  Sodium (VOLTAREN ) 1 % GEL Apply 4 g topically 4 (four) times daily. 01/16/24   Teresa Almarie LABOR,  PA-C  HYDROcodone -acetaminophen  (NORCO/VICODIN) 5-325 MG tablet Take 1 tablet by mouth every 6 (six) hours as needed for moderate pain (pain score 4-6). 07/29/23   Suzette Pac, MD  lidocaine  (LIDODERM ) 5 % Place 1 patch onto the skin daily. Remove & Discard patch within 12 hours or as directed by MD 01/16/24   Teresa Almarie LABOR, PA-C   tamsulosin  (FLOMAX ) 0.4 MG CAPS capsule Take 1 capsule (0.4 mg total) by mouth daily. 07/29/23   Suzette Pac, MD    Physical Exam: Vitals:   03/12/24 1930 03/12/24 2000 03/12/24 2015 03/12/24 2021  BP: (!) 139/95 (!) 143/96 (!) 124/95   Pulse:   82   Resp:  17 18   Temp:    97.6 F (36.4 C)  SpO2:   100%   Weight:      Height:        Physical Exam Vitals reviewed.  Constitutional:      General: He is not in acute distress. HENT:     Head: Normocephalic and atraumatic.  Eyes:     Extraocular Movements: Extraocular movements intact.  Cardiovascular:     Rate and Rhythm: Normal rate and regular rhythm.     Heart sounds: Normal heart sounds.  Pulmonary:     Effort: Pulmonary effort is normal. No respiratory distress.     Breath sounds: No wheezing.  Abdominal:     General: Bowel sounds are normal. There is no distension.     Palpations: Abdomen is soft.     Tenderness: There is no abdominal tenderness.  Musculoskeletal:     Cervical back: Normal range of motion.     Right lower leg: No edema.     Left lower leg: No edema.  Skin:    General: Skin is warm and dry.  Neurological:     General: No focal deficit present.     Mental Status: He is alert and oriented to person, place, and time.     Labs on Admission: I have personally reviewed following labs and imaging studies  CBC: Recent Labs  Lab 03/07/24 1531 03/12/24 1710  WBC 8.6 7.1  NEUTROABS  --  5.7  HGB 12.2* 13.3  HCT 35.2* 39.0  MCV 97.5 97.0  PLT 153 144*   Basic Metabolic Panel: Recent Labs  Lab 03/07/24 1531 03/12/24 1710  NA 138 135  K 4.5 5.4*  CL 112* 104  CO2 17* 19*  GLUCOSE 160* 170*  BUN 30* 47*  CREATININE 1.82* 1.71*  CALCIUM  8.8* 9.0   GFR: Estimated Creatinine Clearance: 21.9 mL/min (A) (by C-G formula based on SCr of 1.71 mg/dL (H)). Liver Function Tests: Recent Labs  Lab 03/07/24 1531 03/12/24 1710  AST 17 16  ALT 17 14  ALKPHOS 77 88  BILITOT 0.8 0.5  PROT 6.2*  6.7  ALBUMIN 3.3* 3.5   Recent Labs  Lab 03/07/24 1531 03/12/24 1710  LIPASE 12 20   No results for input(s): AMMONIA in the last 168 hours. Coagulation Profile: Recent Labs  Lab 03/12/24 1710  INR 1.0   Cardiac Enzymes: No results for input(s): CKTOTAL, CKMB, CKMBINDEX, TROPONINI in the last 168 hours. BNP (last 3 results) No results for input(s): PROBNP in the last 8760 hours. HbA1C: No results for input(s): HGBA1C in the last 72 hours. CBG: No results for input(s): GLUCAP in the last 168 hours. Lipid Profile: No results for input(s): CHOL, HDL, LDLCALC, TRIG, CHOLHDL, LDLDIRECT in the last 72 hours. Thyroid  Function Tests: No results for  input(s): TSH, T4TOTAL, FREET4, T3FREE, THYROIDAB in the last 72 hours. Anemia Panel: No results for input(s): VITAMINB12, FOLATE, FERRITIN, TIBC, IRON, RETICCTPCT in the last 72 hours. Urine analysis:    Component Value Date/Time   COLORURINE YELLOW 03/12/2024 2002   APPEARANCEUR CLEAR 03/12/2024 2002   LABSPEC 1.012 03/12/2024 2002   PHURINE 5.0 03/12/2024 2002   GLUCOSEU NEGATIVE 03/12/2024 2002   GLUCOSEU NEGATIVE 02/11/2012 0931   HGBUR NEGATIVE 03/12/2024 2002   BILIRUBINUR NEGATIVE 03/12/2024 2002   KETONESUR NEGATIVE 03/12/2024 2002   PROTEINUR NEGATIVE 03/12/2024 2002   UROBILINOGEN 0.2 06/20/2022 1043   NITRITE NEGATIVE 03/12/2024 2002   LEUKOCYTESUR NEGATIVE 03/12/2024 2002    Radiological Exams on Admission: CT Angio Chest/Abd/Pel for Dissection W and/or Wo Contrast Result Date: 03/12/2024 EXAM: CTA CHEST, ABDOMEN AND PELVIS WITH AND WITHOUT IV CONTRAST 03/12/2024 07:31:23 PM TECHNIQUE: CTA of the chest was performed with and without the administration of intravenous contrast. CTA of the abdomen and pelvis was performed with and without the administration of intravenous contrast. Multiplanar reformatted images are provided for review. MIP images are provided for review.  Automated exposure control, iterative reconstruction, and/or weight based adjustment of the mA/kV was utilized to reduce the radiation dose to as low as reasonably achievable. 75mL of iohexol  (OMNIPAQUE ) 350 MG/ML injection was used. COMPARISON: Comparison to CT examination on 03/07/2024. CLINICAL HISTORY: Aortic atherosclerosis; sob. FINDINGS: VASCULATURE: AORTA: Mild atherosclerotic calcification within the thoracic aorta. Fusiform infrarenal abdominal aortic aneurysm is present measuring 4.2 x 4.9 cm, stable since prior examination, with circumferential mural thrombus within the aneurysm sac. The aneurysm terminates at the aortic bifurcation. Moderate superimposed aortic iliac atherosclerotic calcification. PULMONARY ARTERIES: No intraluminal filling defect identified to suggest acute or chronic pulmonary embolism. Adequate opacification of the pulmonary arterial tree. GREAT VESSELS OF AORTIC ARCH: The arch vasculature demonstrates conventional anatomic configuration. Less than 50% stenosis of the left subclavian artery at its origin. 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 Celiac axis and superior mesenteric artery are diminutive but otherwise widely patent. 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 RENAL ARTERIES: Right renal artery is diminutive in keeping with the atrophic right kidney. Greater than 50% stenosis of the mid left renal artery ( 146/10 ) ILIAC ARTERIES: Moderate superimposed aortic iliac atherosclerotic calcification. CHEST: MEDIASTINUM: Mild coronary artery calcification. Calcification of the aortic valve leaflets. Global cardiac size within normal limits. No pericardial effusion. LUNGS AND PLEURA: Advanced emphysema. Bronchial wall thickening noted in keeping with airway  inflammation. Since the prior examination, there is increasing bibasilar bronchial wall thickening in keeping with progressive airway inflammation, as well as extensive airway impaction and peripheral consolidation which may reflect changes of acute bronchopneumonia or aspiration. No pneumothorax or pleural effusion. THORACIC BONES AND SOFT TISSUES: No acute bone or soft tissue abnormality. Marked body wall wasting. ABDOMEN AND PELVIS: LIVER: The liver is unremarkable. GALLBLADDER AND BILE DUCTS: Gallbladder is unremarkable. No biliary ductal dilatation. SPLEEN: The spleen is unremarkable. PANCREAS: The pancreas is unremarkable. ADRENAL GLANDS: Bilateral adrenal glands demonstrate no acute abnormality. KIDNEYS, URETERS AND BLADDER: Marked asymmetric atrophy of the right kidney. Nonobstructing 13 mm calculus noted within the lower pole of the right kidney. The left kidney is unremarkable. Bladder is unremarkable. GI AND BOWEL: The stomach, small bowel, and large bowel are otherwise unremarkable. There is relatively poor delineation of the visceral  structures and bowel within the abdomen due to the early phase of contrast administration and marked wasting with paucity of intra-abdominal fat . REPRODUCTIVE: Reproductive organs are unremarkable. PERITONEUM AND RETROPERITONEUM: No ascites or free air. LYMPH NODES: No lymphadenopathy. ABDOMINAL BONES AND SOFT TISSUES: No acute abnormality of the bones. Marked body wall wasting and loss of retroperitoneal and intraabdominal fat . Osseous structures are age appropriate. No acute bone abnormality. No lytic or blastic bone lesion. IMPRESSION: 1. No evidence of pulmonary embolism. 2. Progressive airway inflammation and extensive bibasilar airway impaction and peripheral consolidation in keeping with multifocal bronchopneumonia or aspiration. 3. Stable fusiform infrarenal abdominal aortic aneurysm measuring 4.2 x 4.9 cm with circumferential mural thrombus. No aortic dissection.  4. 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 5. Marked asymmetric atrophy of the right kidney 6. 50% stenosis of the mid left renal artery 7. Advanced emphysema. Marked body wall wasting and loss of retroperitoneal and intraabdominal fat. Electronically signed by: Dorethia Molt MD 03/12/2024 07:55 PM EDT RP Workstation: [id number]   DG Chest 2 View Result Date: 03/12/2024 CLINICAL DATA:  Question of sepsis to evaluate for abnormality. EXAM: CHEST - 2 VIEW COMPARISON:  03/07/2024 FINDINGS: Mild cardiac enlargement. No vascular congestion, edema, or consolidation. Emphysematous changes in the lungs. Peribronchial thickening with central interstitial pattern suggesting chronic bronchitis. No pleural effusion or pneumothorax. Mediastinal contours appear intact. Calcification of the aorta. Degenerative changes in the spine. IMPRESSION: Emphysematous and chronic bronchitic changes in the lungs. No focal consolidation. Mild cardiac enlargement Electronically Signed   By: Elsie Gravely M.D.   On: 03/12/2024 18:14    EKG: Independently reviewed.  Sinus rhythm, baseline wander in V4 and V5.  No significant change since previous tracing.  Assessment and Plan  Multifocal pneumonia  Patient is presenting with complaints of shortness breath and cough for several weeks.  CT showing progressive airway inflammation and extensive bibasilar airway impaction and peripheral consolidation suspicious for multifocal bronchopneumonia versus aspiration.  No fever, leukocytosis, or lactic acidosis.  No signs of sepsis.  Patient is not hypoxic.  Continue ceftriaxone  and azithromycin .  Bronchodilator as needed.  MRSA PCR screen.  Strep pneumo/Legionella urinary antigens.  Test for COVID/flu/RSV.  Follow-up blood cultures.  ?Aspiration, patient denies any episodes of vomiting and denies any episodes of choking or  aspirating when eating.  He has been able to tolerate p.o. intake in the ED without any difficulty.  SLP eval, aspiration precautions.  Bilateral leg pain Suspect due to PAD/claudication.  Pedal pulses could not be palpated on exam but patient had dopplerable dorsalis pedis pulses bilaterally, left had significantly diminished waveform compared to right.  Bilateral lower extremity arterial duplex study ordered.  CT angiogram abdomen pelvis showing additional findings including short segment 50% stenosis of the proximal superior mesenteric artery and the inferior mesenteric artery appears occluded at its origin and is weakly reconstituted.  In addition, showing 50% stenosis of the mid left renal artery.  Consult vascular surgery in the morning.  Right-sided lateral rib pain Appears to be chronic/ongoing for several months.  Patient denies any recent falls or trauma.  No evidence of rib fractures on chest imaging.  Elevated troponin Likely due to demand ischemia.  Troponin elevated but stable, pattern not consistent with ACS.  EKG without acute ischemic changes.  Patient is not endorsing any substernal or left-sided chest pain/pressure.  Mild hyperkalemia in the setting of CKD stage  IIIb No acute EKG changes.  A dose of Lokelma  ordered and monitor labs.  Unintentional weight loss CT chest/abdomen/pelvis without any findings to suggest malignancy.  Chronic HFmrEF Last echo done in October 2023 showing EF 40 to 45%, grade 2 diastolic dysfunction, severe concentric LVH concerning for possible HCM versus amyloidosis, moderate biatrial dilation, and trivial mitral regurgitation.  BNP 409 but patient does not appear volume overloaded on exam.  No evidence of pulmonary edema on imaging.  Repeat echocardiogram ordered.  Type 2 diabetes Last A1c 6.3 in January 2024, repeat ordered.  Placed on sensitive sliding scale insulin  ACHS.  Hypertension: Currently normotensive. COPD: No wheezing.  DuoNeb  PRN. GERD BPH Unable to verify home medications with the patient.  No recent outpatient visit notes or hospitalizations.  Awaiting pharmacy medication reconciliation.  DVT prophylaxis: SQ Heparin  Code Status: Full Code (discussed with the patient) Family Communication: No family available at this time. Level of care: Telemetry bed Admission status: It is my clinical opinion that referral for OBSERVATION is reasonable and necessary in this patient based on the above information provided. The aforementioned taken together are felt to place the patient at high risk for further clinical deterioration. However, it is anticipated that the patient may be medically stable for discharge from the hospital within 24 to 48 hours.  Editha Ram MD [facility]  If 7PM-7AM, please contact night-coverage [email or web address]  03/12/2024, 11:27 PM

## 2024-03-12 NOTE — ED Notes (Signed)
 Pt provided juice after OK from Dr. Emil

## 2024-03-13 ENCOUNTER — Observation Stay (HOSPITAL_COMMUNITY)

## 2024-03-13 ENCOUNTER — Other Ambulatory Visit (HOSPITAL_COMMUNITY): Payer: Self-pay

## 2024-03-13 DIAGNOSIS — I5033 Acute on chronic diastolic (congestive) heart failure: Secondary | ICD-10-CM

## 2024-03-13 DIAGNOSIS — J189 Pneumonia, unspecified organism: Secondary | ICD-10-CM | POA: Diagnosis not present

## 2024-03-13 DIAGNOSIS — N183 Chronic kidney disease, stage 3 unspecified: Secondary | ICD-10-CM

## 2024-03-13 DIAGNOSIS — I739 Peripheral vascular disease, unspecified: Secondary | ICD-10-CM

## 2024-03-13 LAB — BASIC METABOLIC PANEL WITH GFR
Anion gap: 7 (ref 5–15)
BUN: 42 mg/dL — ABNORMAL HIGH (ref 8–23)
CO2: 20 mmol/L — ABNORMAL LOW (ref 22–32)
Calcium: 8.2 mg/dL — ABNORMAL LOW (ref 8.9–10.3)
Chloride: 107 mmol/L (ref 98–111)
Creatinine, Ser: 1.53 mg/dL — ABNORMAL HIGH (ref 0.61–1.24)
GFR, Estimated: 44 mL/min — ABNORMAL LOW (ref >60)
Glucose, Bld: 183 mg/dL — ABNORMAL HIGH (ref 70–99)
Potassium: 4.9 mmol/L (ref 3.5–5.1)
Sodium: 134 mmol/L — ABNORMAL LOW (ref 135–145)

## 2024-03-13 LAB — HEMOGLOBIN A1C
Hgb A1c MFr Bld: 6.4 % — ABNORMAL HIGH (ref 4.8–5.6)
Mean Plasma Glucose: 136.98 mg/dL

## 2024-03-13 LAB — CBG MONITORING, ED: Glucose-Capillary: 146 mg/dL — ABNORMAL HIGH (ref 70–99)

## 2024-03-13 LAB — GLUCOSE, CAPILLARY
Glucose-Capillary: 172 mg/dL — ABNORMAL HIGH (ref 70–99)
Glucose-Capillary: 121 mg/dL — ABNORMAL HIGH (ref 70–99)
Glucose-Capillary: 179 mg/dL — ABNORMAL HIGH (ref 70–99)
Glucose-Capillary: 72 mg/dL (ref 70–99)

## 2024-03-13 LAB — RESP PANEL BY RT-PCR (RSV, FLU A&B, COVID)  RVPGX2
Influenza A by PCR: NEGATIVE
Influenza B by PCR: NEGATIVE
Resp Syncytial Virus by PCR: NEGATIVE
SARS Coronavirus 2 by RT PCR: NEGATIVE

## 2024-03-13 LAB — ECHOCARDIOGRAM COMPLETE
Area-P 1/2: 4.06 cm2
Calc EF: 29.9 %
Height: 66 in
S' Lateral: 2.8 cm
Single Plane A2C EF: 37.9 %
Single Plane A4C EF: 25.1 %
Weight: 1760 [oz_av]

## 2024-03-13 LAB — MRSA NEXT GEN BY PCR, NASAL: MRSA by PCR Next Gen: NOT DETECTED

## 2024-03-13 LAB — STREP PNEUMONIAE URINARY ANTIGEN: Strep Pneumo Urinary Antigen: NEGATIVE

## 2024-03-13 MED ORDER — ACETAMINOPHEN 325 MG PO TABS
650.0000 mg | ORAL_TABLET | Freq: Four times a day (QID) | ORAL | Status: DC | PRN
  Administered 2024-03-13 – 2024-03-18 (×6): 650 mg via ORAL
  Filled 2024-03-13 (×6): qty 2

## 2024-03-13 MED ORDER — ACETAMINOPHEN 650 MG RE SUPP: 650.0000 mg | Freq: Four times a day (QID) | RECTAL | Status: DC | PRN

## 2024-03-13 MED ORDER — AMOXICILLIN-POT CLAVULANATE 500-125 MG PO TABS
1.0000 | ORAL_TABLET | Freq: Two times a day (BID) | ORAL | 0 refills | Status: DC
  Filled 2024-03-13: qty 14, 7d supply, fill #0

## 2024-03-13 MED ORDER — SODIUM CHLORIDE 0.9 % IV SOLN
500.0000 mg | INTRAVENOUS | Status: DC
  Administered 2024-03-13: 500 mg via INTRAVENOUS
  Filled 2024-03-13: qty 5

## 2024-03-13 MED ORDER — SODIUM ZIRCONIUM CYCLOSILICATE 10 G PO PACK
10.0000 g | PACK | Freq: Once | ORAL | Status: AC
  Administered 2024-03-13: 10 g via ORAL
  Filled 2024-03-13: qty 1

## 2024-03-13 MED ORDER — HEPARIN SODIUM (PORCINE) 5000 UNIT/ML IJ SOLN
5000.0000 [IU] | Freq: Three times a day (TID) | INTRAMUSCULAR | Status: DC
Start: 2024-03-13 — End: 2024-03-18
  Administered 2024-03-13 – 2024-03-18 (×16): 5000 [IU] via SUBCUTANEOUS
  Filled 2024-03-13 (×17): qty 1

## 2024-03-13 MED ORDER — INSULIN ASPART 100 UNIT/ML IJ SOLN
0.0000 [IU] | Freq: Three times a day (TID) | INTRAMUSCULAR | Status: DC
  Administered 2024-03-13 – 2024-03-14 (×2): 2 [IU] via SUBCUTANEOUS
  Administered 2024-03-14 – 2024-03-15 (×2): 1 [IU] via SUBCUTANEOUS
  Administered 2024-03-15: 3 [IU] via SUBCUTANEOUS
  Administered 2024-03-15 – 2024-03-16 (×3): 1 [IU] via SUBCUTANEOUS
  Administered 2024-03-16: 3 [IU] via SUBCUTANEOUS
  Administered 2024-03-17: 2 [IU] via SUBCUTANEOUS
  Administered 2024-03-18: 1 [IU] via SUBCUTANEOUS

## 2024-03-13 MED ORDER — INSULIN ASPART 100 UNIT/ML IJ SOLN: 0.0000 [IU] | Freq: Every day | INTRAMUSCULAR | Status: DC

## 2024-03-13 MED ORDER — IPRATROPIUM-ALBUTEROL 0.5-2.5 (3) MG/3ML IN SOLN: 3.0000 mL | Freq: Four times a day (QID) | RESPIRATORY_TRACT | Status: DC | PRN

## 2024-03-13 MED ORDER — SODIUM CHLORIDE 0.9 % IV SOLN
1.0000 g | INTRAVENOUS | Status: AC
  Administered 2024-03-13 – 2024-03-16 (×4): 1 g via INTRAVENOUS
  Filled 2024-03-13 (×4): qty 10

## 2024-03-13 NOTE — TOC Initial Note (Signed)
 Transition of Care (TOC) - Initial/Assessment Note    Patient Details  Name: Vincent Baxter. MRN: [id number] Date of Birth: 1934-08-01  [facility] CM/SW Contact:    Nola Devere Hands, RN Phone Number: 03/13/2024, 4:23 PM  Clinical Narrative:                 Case manager asked to assess patient's transportation home. CM spoke with patient and he states that he is barely able to walk, his legs are stiff and his ankles are stiff. He states that this is part of the reason he came into hospital but he feels no better. Patient's car is in parking deck, He is not able to ambulate to his car, he lives alone and that is an issue presently. CM is requesting MD have PT/OT see patient.  Bedside RN Tammi updated.         Patient Goals and CMS Choice            Expected Discharge Plan and Services         Expected Discharge Date: 03/13/24                                    Prior Living Arrangements/Services                       Activities of Daily Living      Permission Sought/Granted                  Emotional Assessment              Admission diagnosis:  Multifocal pneumonia [J18.9] Patient Active Problem List   Diagnosis Date Noted   Claudication of both lower extremities 03/13/2024   CKD (chronic kidney disease) stage 3, GFR 30-59 ml/min (HCC) 03/13/2024   Multifocal pneumonia 03/12/2024   Cardiomyopathy (HCC) 04/19/2022   Left ventricular hypertrophy 04/19/2022   Renal mass 03/23/2022   Rib pain 03/23/2022   Bradycardia 03/22/2022   Abnormal EKG    PAC (premature atrial contraction)    PVC (premature ventricular contraction)    Smoking    Right flank pain 05/02/2020   Elevated troponin 05/01/2020   New onset of congestive heart failure (HCC) 05/01/2020   AAA (abdominal aortic aneurysm)    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   ANOREXIA 02/23/2009   DYSLIPIDEMIA 01/18/2009    DM2 (diabetes mellitus, type 2) (HCC) 01/10/2009   GERD 01/10/2009   Essential hypertension 01/04/2009   RENAL CALCULUS 01/04/2009   PCP:  [facility] Pharmacy:   [facility] - [facility] [address] [place] [address] Phone: [phone] Fax: [phone]  [id number] [place], [place] - [address] [place] [address] Phone: [phone] Fax: [phone]  [facility] [address] [place] [address] Phone: [phone] Fax: [phone]     Social Drivers of Health (SDOH) Social History: SDOH Screenings   Food Insecurity: No Food Insecurity (03/13/2024)  Housing: Low Risk  (03/13/2024)  Transportation Needs: No Transportation Needs (03/13/2024)  Utilities: Not At Risk (03/13/2024)  Social Connections: Socially Isolated (03/13/2024)  Tobacco Use: High Risk (03/12/2024)   SDOH Interventions:     Readmission Risk Interventions     No data to display

## 2024-03-13 NOTE — Care Management Obs Status (Signed)
 MEDICARE OBSERVATION STATUS NOTIFICATION   Patient Details  Name: Vincent Baxter. MRN: [id number] Date of Birth: 1935/01/13   Medicare Observation Status Notification Given:       Claretta Deed 03/13/2024, 3:28 PM

## 2024-03-13 NOTE — Care Management Obs Status (Signed)
 MEDICARE OBSERVATION STATUS NOTIFICATION   Patient Details  Name: Vincent Baxter. MRN: [id number] Date of Birth: 08-28-1934   Medicare Observation Status Notification Given:    Obs signed and copy given    Claretta Deed 03/13/2024, 1:46 PM

## 2024-03-13 NOTE — Care Management Obs Status (Signed)
 MEDICARE OBSERVATION STATUS NOTIFICATION   Patient Details  Name: Vincent Baxter. MRN: [id number] Date of Birth: [birth year]   Medicare Observation Status Notification Given:     Obs signed and copy given   Claretta Deed 03/13/2024, 2:37 PM

## 2024-03-13 NOTE — Plan of Care (Signed)

## 2024-03-13 NOTE — Discharge Summary (Signed)
 Physician Discharge Summary  Vincent Baxter. FMW:[id number] DOB: Aug 09, 1934 DOA: 03/12/2024  PCP: [facility]  Admit date: 03/12/2024 Discharge date: 03/13/2024    Admitted From: Home Disposition: Home  Recommendations for Outpatient Follow-up:  Follow up with PCP in 1-2 weeks Please obtain BMP/CBC in one week Follow-up with vascular surgery-they will schedule and call you. Please follow up with your PCP on the following pending results: Unresulted Labs (From admission, onward)     Start     Ordered   03/13/24 1205  Respiratory (~20 pathogens) panel by PCR  (Respiratory panel by PCR (~20 pathogens, ~24 hr TAT)  w precautions)  Once,   R        03/13/24 1204   03/13/24 0048  Legionella Pneumophila Serogp 1 Ur Ag  (COPD / Pneumonia / Cellulitis / Lower Extremity Wound (Diabetic Foot Infection))  Once,   R        03/13/24 0053              Home Health: None Equipment/Devices: None none  Discharge Condition: Stable CODE STATUS: Full code Diet recommendation:  Diet Order             Diet heart healthy/carb modified Room service appropriate? Yes; Fluid consistency: Thin  Diet effective now                 Due to brief hospitalization, I have copied admitting hospitalist HPI and ED course as below.  HPI: [name]. is a 89 y.o. male with medical history significant of hypertension, HFmrEF, COPD, type 2 diabetes, renal mass, nephrolithiasis, AAA, CKD stage IIIb, anemia, GERD, THC and tobacco abuse, BPH presenting with a chief complaint of right-sided lateral rib pain which has been ongoing for several months.  He denies any recent falls or trauma.  He is also reporting shortness of breath and cough for several weeks.  Denies fevers.  Patient states he is not able to taste food and has lost his appetite over the past few months.  Reports losing weight but he is not sure how much.  He is also endorsing bilateral leg pain which is worse with exertion x 3  months.  No other complaints.   ED course: Vital signs stable.  EKG showing sinus rhythm and no acute ischemic changes.  Labs showing no leukocytosis, hemoglobin normal, platelet count 144k (chronically low and no significant change from baseline), potassium 5.4, bicarb 19 (chronically low and stable), anion gap 12, glucose 170, BUN 47, creatinine 1.7 (stable), normal lipase and LFTs, lactic acid normal x 2, blood cultures in process, troponin 108> 105, BNP 409, UA not suggestive of infection.    Subjective: Patient seen and examined.  He denied any shortness of breath at all.  He said his major reason for coming to the ED was bilateral leg pain, more so he described this as stiffness which has been ongoing for about 4 months.  He did not have any open sores on my examination.  Brief/Interim Summary: Details of the hospitalization below.  Multifocal pneumonia  Presented with shortness of breath, CT chest showing progressive airway inflammation and extensive bibasilar airway impaction and peripheral consolidation suspicious for multifocal bronchopneumonia versus aspiration.  No fever, leukocytosis, or lactic acidosis.  No signs of sepsis.  Patient not hypoxic.  Given Rocephin  and azithromycin  and bronchodilator and those were continued.  Test sent for strep pneumo/Legionella urinary antigens.  Test for COVID/flu/RSV negative.  Patient has no complaints of shortness  of breath or cough today and no problem swallowing.  He was never hypoxic.  Lungs clear to auscultation on my examination.  Discharging on 7 days of Augmentin .   Bilateral leg pain Suspected due to PAD/claudication.  CT angiogram abdomen pelvis showing additional findings including short segment 50% stenosis of the proximal superior mesenteric artery and the inferior mesenteric artery appears occluded at its origin and is weakly reconstituted.  In addition, showing 50% stenosis of the mid left renal artery.  Consulted vascular surgery, seen by  them, per them,CTA showing 5 cm abdominal aortic aneurysm.  He has no abdominal pain.  This can be followed as an outpatient.  In men we repair there is greater than 5.5 cm and his aneurysm does not meet criteria for repair.SABRA  He has palpable femoral pulses and brisk Doppler signals in both feet.  He has no wounds.  He complains of his right leg feeling stiff and having weakness but I do not think this is a primary arterial problem.  He really does not have classic claudication symptoms.  He can get ABIs and likely follow-up as an outpatient.  ABI has been completed, results pending.  Patient is cleared for discharge from vascular perspective.   Right-sided lateral rib pain Appears to be chronic/ongoing for several months.  Patient denies any recent falls or trauma.  No evidence of rib fractures on chest imaging.   Elevated troponin Likely due to demand ischemia.  Troponin elevated but stable, pattern not consistent with ACS.  EKG without acute ischemic changes.  Patient is not endorsing any substernal or left-sided chest pain/pressure.   Mild hyperkalemia in the setting of CKD stage IIIb No acute EKG changes.  A dose of Lokelma  given, potassium normalized.   Unintentional weight loss CT chest/abdomen/pelvis without any findings to suggest malignancy.   Chronic HFmrEF Last echo done in October 2023 showing EF 40 to 45%, grade 2 diastolic dysfunction, severe concentric LVH concerning for possible HCM versus amyloidosis, moderate biatrial dilation, and trivial mitral regurgitation.  BNP 409 but patient does not appear volume overloaded on exam.  No evidence of pulmonary edema on imaging.  Repeat echocardiogram shows EF of 25 to 30% and grade 2 diastolic dysfunction.  Patient is asymptomatic as far as CHF goes.  [facility]  Recommended close follow-up with cardiology.   Discharge plan was discussed with patient and/or family member and they verbalized understanding and agreed with it.   Discharge Diagnoses:  Principal Problem:   Multifocal pneumonia Active Problems:   DM2 (diabetes mellitus, type 2) (HCC)   COPD (chronic obstructive pulmonary disease) with emphysema (HCC)   Claudication of both lower extremities   CKD (chronic kidney disease) stage 3, GFR 30-59 ml/min (HCC)    Discharge Instructions   Allergies as of 03/13/2024   No Known Allergies      Medication List     STOP taking these medications    amoxicillin  500 MG tablet Commonly known as: AMOXIL        TAKE these medications    acetaminophen  500 MG tablet Commonly known as: TYLENOL  Take 1 tablet (500 mg total) by mouth every 6 (six) hours as needed for moderate pain (pain score 4-6).   amLODipine  5 MG tablet Commonly known as: NORVASC  Take 1 tablet (5 mg total) by mouth daily.   amoxicillin -clavulanate 875-125 MG tablet Commonly known as: AUGMENTIN  Take 1 tablet by mouth 2 (two) times daily for 7 days.   diclofenac  Sodium 1 % Gel  Commonly known as: VOLTAREN  Apply 4 g topically 4 (four) times daily.   HYDROcodone -acetaminophen  5-325 MG tablet Commonly known as: NORCO/VICODIN Take 1 tablet by mouth every 6 (six) hours as needed for moderate pain (pain score 4-6).   lidocaine  5 % Commonly known as: Lidoderm  Place 1 patch onto the skin daily. Remove & Discard patch within 12 hours or as directed by MD   tamsulosin  0.4 MG Caps capsule Commonly known as: Flomax  Take 1 capsule (0.4 mg total) by mouth daily.        Follow-up Information     [facility] up in 1 week(s).   Contact information: [address] [facility] [place] [address] [phone]                No Known Allergies  Consultations: Vascular surgery   Procedures/Studies: ECHOCARDIOGRAM COMPLETE Result Date: 03/13/2024    ECHOCARDIOGRAM REPORT   Patient Name:   Leonell Lobdell. Date of Exam: 03/13/2024 Medical Rec #:  [id number]          Height:       66.0 in  Accession #:    [id number]         Weight:       110.0 lb Date of Birth:  02-06-1935         BSA:          1.551 m Patient Age:    88 years           BP:           135/87 mmHg Patient Gender: M                  HR:           79 bpm. Exam Location:  Inpatient Procedure: 2D Echo, 3D Echo, Cardiac Doppler, Color Doppler and Strain Analysis            (Both Spectral and Color Flow Doppler were utilized during            procedure). Indications:    I50.33 Acute on chronic diastolic (congestive) heart failure  History:        Patient has prior history of Echocardiogram examinations, most                 recent 03/23/2022. Cardiomyopathy, Abnormal ECG, COPD,                 Arrythmias:PVC, PAC and Bradycardia; Risk Factors:Current                 Smoker, Hypertension, Diabetes and Dyslipidemia. LVH.  Sonographer:    Ellouise Mose RDCS Referring Phys: ALFORNIA MADISON  Sonographer Comments: Patient talking throughout exam. IMPRESSIONS  1. No obstruction. Left ventricular ejection fraction, by estimation, is 25 to 30%. Left ventricular ejection fraction by 3D volume is 28 %. Left ventricular ejection fraction by 2D MOD biplane is 29.9 %. Left ventricular ejection fraction by PLAX is 22  %. The left ventricle has severely decreased function. The left ventricle demonstrates global hypokinesis. There is severe concentric left ventricular hypertrophy. Left ventricular diastolic parameters are consistent with Grade II diastolic dysfunction (pseudonormalization). The average left ventricular global longitudinal strain is -10.0 %. The global longitudinal strain is abnormal.  2. Right ventricular systolic function is mildly reduced. The right ventricular size is normal. Severely increased right ventricular wall thickness. There is normal pulmonary artery systolic pressure. The estimated right ventricular systolic pressure is  17.1 mmHg.  3.  A small pericardial effusion is present. The pericardial effusion is posterior to the left  ventricle and localized near the right atrium. There is no evidence of cardiac tamponade.  4. The mitral valve is normal in structure. Trivial mitral valve regurgitation. No evidence of mitral stenosis.  5. The aortic valve is tricuspid. There is moderate calcification of the aortic valve. There is moderate thickening of the aortic valve. Aortic valve regurgitation is not visualized. Aortic valve sclerosis is present, with no evidence of aortic valve stenosis.  6. The inferior vena cava is dilated in size with >50% respiratory variability, suggesting right atrial pressure of 8 mmHg. Conclusion(s)/Recommendation(s): Consider cardiac amyloidosis, HCM. FINDINGS  Left Ventricle: No obstruction. Left ventricular ejection fraction, by estimation, is 25 to 30%. Left ventricular ejection fraction by PLAX is 22 %. Left ventricular ejection fraction by 2D MOD biplane is 29.9 %. Left ventricular ejection fraction by 3D  volume is 28 %. The left ventricle has severely decreased function. The left ventricle demonstrates global hypokinesis. The average left ventricular global longitudinal strain is -10.0 %. Strain was performed and the global longitudinal strain is abnormal. The left ventricular internal cavity size was normal in size. There is severe concentric left ventricular hypertrophy. Left ventricular diastolic parameters are consistent with Grade II diastolic dysfunction (pseudonormalization). Right Ventricle: The right ventricular size is normal. Severely increased right ventricular wall thickness. Right ventricular systolic function is mildly reduced. There is normal pulmonary artery systolic pressure. The tricuspid regurgitant velocity is 1.88 m/s, and with an assumed right atrial pressure of 3 mmHg, the estimated right ventricular systolic pressure is 17.1 mmHg. Left Atrium: Left atrial size was normal in size. Right Atrium: Right atrial size was normal in size. Pericardium: A small pericardial effusion is present.  The pericardial effusion is posterior to the left ventricle and localized near the right atrium. There is no evidence of cardiac tamponade. Mitral Valve: The mitral valve is normal in structure. There is moderate thickening of the mitral valve leaflet(s). Trivial mitral valve regurgitation. No evidence of mitral valve stenosis. Tricuspid Valve: The tricuspid valve is normal in structure. Tricuspid valve regurgitation is trivial. No evidence of tricuspid stenosis. Aortic Valve: The aortic valve is tricuspid. There is moderate calcification of the aortic valve. There is moderate thickening of the aortic valve. Aortic valve regurgitation is not visualized. Aortic valve sclerosis is present, with no evidence of aortic valve stenosis. Pulmonic Valve: The pulmonic valve was normal in structure. Pulmonic valve regurgitation is not visualized. No evidence of pulmonic stenosis. Aorta: The aortic root and ascending aorta are structurally normal, with no evidence of dilitation. Venous: The inferior vena cava is dilated in size with greater than 50% respiratory variability, suggesting right atrial pressure of 8 mmHg. IAS/Shunts: No atrial level shunt detected by color flow Doppler. Additional Comments: 3D was performed not requiring image post processing on an independent workstation and was abnormal.  LEFT VENTRICLE PLAX 2D                        Biplane EF (MOD) LV EF:         Left            LV Biplane EF:   Left                ventricular                      ventricular  ejection                         ejection                fraction by                      fraction by                PLAX is 22                       2D MOD                %.                               biplane is LVIDd:         3.10 cm                          29.9 %. LVIDs:         2.80 cm LV PW:         1.90 cm LV IVS:        2.10 cm         2D Longitudinal LVOT diam:     2.10 cm         Strain LV SV:         34              2D Strain  GLS   -10.0 % LV SV Index:   22              Avg: LVOT Area:     3.46 cm LV IVRT:       46 msec         3D Volume EF                                LV 3D EF:    Left                                             ventricul LV Volumes (MOD)                            ar LV vol d, MOD    70.4 ml                    ejection A2C:                                        fraction LV vol d, MOD    52.5 ml                    by 3D A4C:                                        volume is LV vol s, MOD    43.7  ml                    28 %. A2C: LV vol s, MOD    39.3 ml A4C:                           3D Volume EF: LV SV MOD A2C:   26.7 ml       3D EF:        28 % LV SV MOD A4C:   52.5 ml       LV EDV:       85 ml LV SV MOD BP:    18.4 ml       LV ESV:       61 ml                                LV SV:        24 ml RIGHT VENTRICLE             IVC RV S prime:     11.50 cm/s  IVC diam: 2.30 cm TAPSE (M-mode): 1.2 cm LEFT ATRIUM             Index        RIGHT ATRIUM           Index LA diam:        3.50 cm 2.26 cm/m   RA Area:     10.80 cm LA Vol (A2C):   32.6 ml 21.02 ml/m  RA Volume:   20.50 ml  13.22 ml/m LA Vol (A4C):   24.6 ml 15.86 ml/m LA Biplane Vol: 29.2 ml 18.83 ml/m  AORTIC VALVE LVOT Vmax:   64.50 cm/s LVOT Vmean:  39.300 cm/s LVOT VTI:    0.099 m  AORTA Ao Root diam: 3.30 cm Ao Asc diam:  3.20 cm MITRAL VALVE               TRICUSPID VALVE MV Area (PHT): 4.06 cm    TR Peak grad:   14.1 mmHg MV Decel Time: 187 msec    TR Vmax:        188.00 cm/s MV E velocity: 90.70 cm/s MV A velocity: 47.70 cm/s  SHUNTS MV E/A ratio:  1.90        Systemic VTI:  0.10 m                            Systemic Diam: 2.10 cm Oneil Parchment MD Electronically signed by Oneil Parchment MD Signature Date/Time: 03/13/2024/10:30:00 AM    Final    CT Angio Chest/Abd/Pel for Dissection W and/or Wo Contrast Result Date: 03/12/2024 EXAM: CTA CHEST, ABDOMEN AND PELVIS WITH AND WITHOUT IV CONTRAST 03/12/2024 07:31:23 PM TECHNIQUE: CTA of the chest was performed  with and without the administration of intravenous contrast. CTA of the abdomen and pelvis was performed with and without the administration of intravenous contrast. Multiplanar reformatted images are provided for review. MIP images are provided for review. Automated exposure control, iterative reconstruction, and/or weight based adjustment of the mA/kV was utilized to reduce the radiation dose to as low as reasonably achievable. 75mL of iohexol  (OMNIPAQUE ) 350 MG/ML injection was used. COMPARISON: Comparison to CT examination on 03/07/2024. CLINICAL HISTORY: Aortic atherosclerosis; sob. FINDINGS: VASCULATURE: AORTA: Mild atherosclerotic calcification within the thoracic aorta. Fusiform infrarenal abdominal  aortic aneurysm is present measuring 4.2 x 4.9 cm, stable since prior examination, with circumferential mural thrombus within the aneurysm sac. The aneurysm terminates at the aortic bifurcation. Moderate superimposed aortic iliac atherosclerotic calcification. PULMONARY ARTERIES: No intraluminal filling defect identified to suggest acute or chronic pulmonary embolism. Adequate opacification of the pulmonary arterial tree. GREAT VESSELS OF AORTIC ARCH: The arch vasculature demonstrates conventional anatomic configuration. Less than 50% stenosis of the left subclavian artery at its origin. 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 Celiac axis and superior mesenteric artery are diminutive but otherwise widely patent. 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 RENAL ARTERIES: Right renal artery is diminutive in keeping with the atrophic right kidney. Greater than 50% stenosis of the mid left renal artery ( 146/10 ) ILIAC ARTERIES: Moderate superimposed aortic iliac atherosclerotic calcification. CHEST:  MEDIASTINUM: Mild coronary artery calcification. Calcification of the aortic valve leaflets. Global cardiac size within normal limits. No pericardial effusion. LUNGS AND PLEURA: Advanced emphysema. Bronchial wall thickening noted in keeping with airway inflammation. Since the prior examination, there is increasing bibasilar bronchial wall thickening in keeping with progressive airway inflammation, as well as extensive airway impaction and peripheral consolidation which may reflect changes of acute bronchopneumonia or aspiration. No pneumothorax or pleural effusion. THORACIC BONES AND SOFT TISSUES: No acute bone or soft tissue abnormality. Marked body wall wasting. ABDOMEN AND PELVIS: LIVER: The liver is unremarkable. GALLBLADDER AND BILE DUCTS: Gallbladder is unremarkable. No biliary ductal dilatation. SPLEEN: The spleen is unremarkable. PANCREAS: The pancreas is unremarkable. ADRENAL GLANDS: Bilateral adrenal glands demonstrate no acute abnormality. KIDNEYS, URETERS AND BLADDER: Marked asymmetric atrophy of the right kidney. Nonobstructing 13 mm calculus noted within the lower pole of the right kidney. The left kidney is unremarkable. Bladder is unremarkable. GI AND BOWEL: The stomach, small bowel, and large bowel are otherwise unremarkable. There is relatively poor delineation of the visceral structures and bowel within the abdomen due to the early phase of contrast administration and marked wasting with paucity of intra-abdominal fat . REPRODUCTIVE: Reproductive organs are unremarkable. PERITONEUM AND RETROPERITONEUM: No ascites or free air. LYMPH NODES: No lymphadenopathy. ABDOMINAL BONES AND SOFT TISSUES: No acute abnormality of the bones. Marked body wall wasting and loss of retroperitoneal and intraabdominal fat . Osseous structures are age appropriate. No acute bone abnormality. No lytic or blastic bone lesion. IMPRESSION: 1. No evidence of pulmonary embolism. 2. Progressive airway inflammation and extensive  bibasilar airway impaction and peripheral consolidation in keeping with multifocal bronchopneumonia or aspiration. 3. Stable fusiform infrarenal abdominal aortic aneurysm measuring 4.2 x 4.9 cm with circumferential mural thrombus. No aortic dissection. 4. 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 5. Marked asymmetric atrophy of the right kidney 6. 50% stenosis of the mid left renal artery 7. Advanced emphysema. Marked body wall wasting and loss of retroperitoneal and intraabdominal fat. Electronically signed by: Dorethia Molt MD 03/12/2024 07:55 PM EDT RP Workstation: [id number]   DG Chest 2 View Result Date: 03/12/2024 CLINICAL DATA:  Question of sepsis to evaluate for abnormality. EXAM: CHEST - 2 VIEW COMPARISON:  03/07/2024 FINDINGS: Mild cardiac enlargement. No vascular congestion, edema, or consolidation. Emphysematous changes in the lungs. Peribronchial thickening with central interstitial pattern suggesting chronic bronchitis. No pleural effusion or pneumothorax. Mediastinal contours appear intact.  Calcification of the aorta. Degenerative changes in the spine. IMPRESSION: Emphysematous and chronic bronchitic changes in the lungs. No focal consolidation. Mild cardiac enlargement Electronically Signed   By: Elsie Gravely M.D.   On: 03/12/2024 18:14   CT ABDOMEN PELVIS WO CONTRAST Result Date: 03/07/2024 CLINICAL DATA:  Abdomen pain weight loss EXAM: CT ABDOMEN AND PELVIS WITHOUT CONTRAST TECHNIQUE: Multidetector CT imaging of the abdomen and pelvis was performed following the standard protocol without IV contrast. RADIATION DOSE REDUCTION: This exam was performed according to the departmental dose-optimization program which includes automated exposure control, adjustment of the mA and/or kV according to patient size and/or use of iterative reconstruction technique. COMPARISON:  CT  12/20/2022, 06/21/2022, 03/22/2022 FINDINGS: Lower chest: Lung bases demonstrate trace pleural effusion. Emphysema. Partial airspace opacities in the bases could reflect atelectasis or mild scarring. Hepatobiliary: Contracted gallbladder without calcified stone. No biliary dilatation Pancreas: Unremarkable. No pancreatic ductal dilatation or surrounding inflammatory changes. Spleen: Normal in size without focal abnormality. Adrenals/Urinary Tract: Stable adrenal glands. Atrophic right kidney. Stones in the lower pole of the right kidney measuring up to 10 mm. Cyst lower pole right kidney, no imaging follow-up is recommended. Partially exophytic hypodensity measuring 14 mm, best seen on coronal series 6, image 35, mid pole right kidney. The bladder is unremarkable Stomach/Bowel: The stomach is nonenlarged. There is no dilated small bowel. No acute bowel wall thickening. Vascular/Lymphatic: Advanced aortic atherosclerosis. Distal infrarenal abdominal aortic aneurysm measures 4.8 x 4.3 cm, previously 4.4 cm. No grossly enlarged lymph nodes allowing for paucity of fat Reproductive: Prostate slightly enlarged Other: Negative for pelvic effusion or free air Musculoskeletal: No acute or suspicious osseous abnormality IMPRESSION: 1. No CT evidence for acute intra-abdominal or pelvic abnormality. Limited exam secondary to absence of contrast and paucity of intra-abdominal fat 2. Atrophic right kidney with nonobstructing stones in the lower pole. 3. 14 mm partially exophytic hypodensity mid pole right kidney, indeterminate in appearance. Consider correlation with nonemergent renal ultrasound. 4. Distal infrarenal abdominal aortic aneurysm measuring up to 4.8 cm, previously 4.4 cm. Recommend follow-up CT or MR as appropriate in 12 months and referral to or continued care with vascular specialist. (Ref.: J Vasc Surg. 2018; 67:2-77 and J Am Coll Radiol 2013;10(10):789-794.) Aortic Atherosclerosis (ICD10-I70.0) and Emphysema  (ICD10-J43.9). Electronically Signed   By: Luke Bun M.D.   On: 03/07/2024 19:07   DG Chest Port 1 View Result Date: 03/07/2024 CLINICAL DATA:  Cough EXAM: PORTABLE CHEST 1 VIEW COMPARISON:  Chest x-ray 02/24/2024. FINDINGS: Heart is enlarged. There are atherosclerotic calcifications of the aorta. Both lungs are clear. There is no pleural effusion or pneumothorax. The visualized skeletal structures are unremarkable. IMPRESSION: 1. No active disease. 2. Cardiomegaly. Electronically Signed   By: Greig Pique M.D.   On: 03/07/2024 18:14   DG Ribs Unilateral W/Chest Right Result Date: 02/24/2024 CLINICAL DATA:  Rib pain on the right. EXAM: RIGHT RIBS AND CHEST - 3+ VIEW COMPARISON:  02/14/2024 and CT chest 10/31/2021. FINDINGS: Trachea is midline. Heart size stable. Lungs are emphysematous. Streaky scarring in the lung bases. No pleural fluid. Dedicated views of the right ribs show no fracture. IMPRESSION: 1. No acute findings. 2. Emphysema with bibasilar scarring. Electronically Signed   By: Newell Eke M.D.   On: 02/24/2024 17:16   DG Chest Port 1 View Result Date: 02/14/2024 CLINICAL DATA:  Shortness of breath.  Weakness. EXAM: PORTABLE CHEST 1 VIEW COMPARISON:  Radiograph 01/15/2024.  Chest CT 10/31/2021 FINDINGS: The heart is enlarged. Stable mediastinal  contours. Small bilateral pleural effusions. No features of pulmonary edema. No confluent consolidation. No pneumothorax. No acute osseous findings. IMPRESSION: Cardiomegaly with small bilateral pleural effusions. Electronically Signed   By: Andrea Gasman M.D.   On: 02/14/2024 19:25     Discharge Exam: Vitals:   03/13/24 0300 03/13/24 0455  BP: 116/84 135/87  Pulse:  78  Resp: 15 16  Temp:  (!) 97.5 F (36.4 C)  SpO2:  96%   Vitals:   03/13/24 0100 03/13/24 0132 03/13/24 0300 03/13/24 0455  BP: 114/80  116/84 135/87  Pulse:    78  Resp: 10  15 16   Temp:  97.6 F (36.4 C)  (!) 97.5 F (36.4 C)  SpO2:    96%  Weight:       Height:        General: Pt is alert, awake, not in acute distress Cardiovascular: RRR, S1/S2 +, no rubs, no gallops Respiratory: CTA bilaterally, no wheezing, no rhonchi Abdominal: Soft, NT, ND, bowel sounds + Extremities: no edema, no cyanosis    The results of significant diagnostics from this hospitalization (including imaging, microbiology, ancillary and laboratory) are listed below for reference.     Microbiology: Recent Results (from the past 240 hours)  Resp panel by RT-PCR (RSV, Flu A&B, Covid) Anterior Nasal Swab     Status: None   Collection Time: 03/07/24  5:39 PM   Specimen: Anterior Nasal Swab  Result Value Ref Range Status   SARS Coronavirus 2 by RT PCR NEGATIVE NEGATIVE Final   Influenza A by PCR NEGATIVE NEGATIVE Final   Influenza B by PCR NEGATIVE NEGATIVE Final    Comment: (NOTE) The Xpert Xpress SARS-CoV-2/FLU/RSV plus assay is intended as an aid in the diagnosis of influenza from Nasopharyngeal swab specimens and should not be used as a sole basis for treatment. Nasal washings and aspirates are unacceptable for Xpert Xpress SARS-CoV-2/FLU/RSV testing.  Fact Sheet for Patients: BloggerCourse.com  Fact Sheet for Healthcare Providers: SeriousBroker.it  This test is not yet approved or cleared by the [place]  FDA and has been authorized for detection and/or diagnosis of SARS-CoV-2 by FDA under an Emergency Use Authorization (EUA). This EUA will remain in effect (meaning this test can be used) for the duration of the COVID-19 declaration under Section 564(b)(1) of the Act, 21 U.S.C. section 360bbb-3(b)(1), unless the authorization is terminated or revoked.     Resp Syncytial Virus by PCR NEGATIVE NEGATIVE Final    Comment: (NOTE) Fact Sheet for Patients: BloggerCourse.com  Fact Sheet for Healthcare Providers: SeriousBroker.it  This test is not  yet approved or cleared by the [place]  FDA and has been authorized for detection and/or diagnosis of SARS-CoV-2 by FDA under an Emergency Use Authorization (EUA). This EUA will remain in effect (meaning this test can be used) for the duration of the COVID-19 declaration under Section 564(b)(1) of the Act, 21 U.S.C. section 360bbb-3(b)(1), unless the authorization is terminated or revoked.  Performed at [facility], [address] [place], [place] [address]   MRSA Next Gen by PCR, Nasal     Status: None   Collection Time: 03/12/24  3:53 PM   Specimen: Nasal Swab  Result Value Ref Range Status   MRSA by PCR Next Gen NOT DETECTED NOT DETECTED Final    Comment: (NOTE) The GeneXpert MRSA Assay (FDA approved for NASAL specimens only), is one component of a comprehensive MRSA colonization surveillance program. It is not intended to diagnose MRSA infection nor to  guide or monitor treatment for MRSA infections. Test performance is not FDA approved in patients less than 57 years old. Performed at [facility], [address] [place], [place] [address]   Blood Culture (routine x 2)     Status: None (Preliminary result)   Collection Time: 03/12/24  4:50 PM   Specimen: BLOOD  Result Value Ref Range Status   Specimen Description BLOOD LEFT ANTECUBITAL  Final   Special Requests   Final    BOTTLES DRAWN AEROBIC AND ANAEROBIC Blood Culture adequate volume   Culture   Final    NO GROWTH < 12 HOURS Performed at [facility], [address], [place], [place] [address]    Report Status PENDING  Incomplete  Blood Culture (routine x 2)     Status: None (Preliminary result)   Collection Time: 03/12/24  5:04 PM   Specimen: BLOOD  Result Value Ref Range Status   Specimen Description BLOOD LEFT ANTECUBITAL  Final   Special Requests   Final    BOTTLES DRAWN AEROBIC ONLY Blood Culture results may not be optimal due to an inadequate volume of blood received in culture bottles    Culture   Final    NO GROWTH < 12 HOURS Performed at [facility], [address] [place], [place] [address]    Report Status PENDING  Incomplete  Resp panel by RT-PCR (RSV, Flu A&B, Covid) Anterior Nasal Swab     Status: None   Collection Time: 03/13/24  4:24 AM   Specimen: Anterior Nasal Swab  Result Value Ref Range Status   SARS Coronavirus 2 by RT PCR NEGATIVE NEGATIVE Final   Influenza A by PCR NEGATIVE NEGATIVE Final   Influenza B by PCR NEGATIVE NEGATIVE Final    Comment: (NOTE) The Xpert Xpress SARS-CoV-2/FLU/RSV plus assay is intended as an aid in the diagnosis of influenza from Nasopharyngeal swab specimens and should not be used as a sole basis for treatment. Nasal washings and aspirates are unacceptable for Xpert Xpress SARS-CoV-2/FLU/RSV testing.  Fact Sheet for Patients: BloggerCourse.com  Fact Sheet for Healthcare Providers: SeriousBroker.it  This test is not yet approved or cleared by the [place]  FDA and has been authorized for detection and/or diagnosis of SARS-CoV-2 by FDA under an Emergency Use Authorization (EUA). This EUA will remain in effect (meaning this test can be used) for the duration of the COVID-19 declaration under Section 564(b)(1) of the Act, 21 U.S.C. section 360bbb-3(b)(1), unless the authorization is terminated or revoked.     Resp Syncytial Virus by PCR NEGATIVE NEGATIVE Final    Comment: (NOTE) Fact Sheet for Patients: BloggerCourse.com  Fact Sheet for Healthcare Providers: SeriousBroker.it  This test is not yet approved or cleared by the [place]  FDA and has been authorized for detection and/or diagnosis of SARS-CoV-2 by FDA under an Emergency Use Authorization (EUA). This EUA will remain in effect (meaning this test can be used) for the duration of the COVID-19 declaration under Section 564(b)(1) of the Act, 21  U.S.C. section 360bbb-3(b)(1), unless the authorization is terminated or revoked.  Performed at [facility], [address], [place], [place] [address]      Labs: BNP (last 3 results) Recent Labs    03/12/24 1711  BNP 409.4*   Basic Metabolic Panel: Recent Labs  Lab 03/07/24 1531 03/12/24 1710 03/13/24 0424  NA 138 135 134*  K 4.5 5.4* 4.9  CL 112* 104 107  CO2 17* 19* 20*  GLUCOSE 160* 170* 183*  BUN 30* 47* 42*  CREATININE 1.82* 1.71* 1.53*  CALCIUM  8.8* 9.0 8.2*   Liver Function Tests: Recent Labs  Lab 03/07/24 1531 03/12/24 1710  AST 17 16  ALT 17 14  ALKPHOS 77 88  BILITOT 0.8 0.5  PROT 6.2* 6.7  ALBUMIN 3.3* 3.5   Recent Labs  Lab 03/07/24 1531 03/12/24 1710  LIPASE 12 20   No results for input(s): AMMONIA in the last 168 hours. CBC: Recent Labs  Lab 03/07/24 1531 03/12/24 1710  WBC 8.6 7.1  NEUTROABS  --  5.7  HGB 12.2* 13.3  HCT 35.2* 39.0  MCV 97.5 97.0  PLT 153 144*   Cardiac Enzymes: No results for input(s): CKTOTAL, CKMB, CKMBINDEX, TROPONINI in the last 168 hours. BNP: Invalid input(s): POCBNP CBG: Recent Labs  Lab 03/13/24 0154 03/13/24 0859  GLUCAP 146* 172*   D-Dimer No results for input(s): DDIMER in the last 72 hours. Hgb A1c Recent Labs    03/13/24 0424  HGBA1C 6.4*   Lipid Profile No results for input(s): CHOL, HDL, LDLCALC, TRIG, CHOLHDL, LDLDIRECT in the last 72 hours. Thyroid  function studies No results for input(s): TSH, T4TOTAL, T3FREE, THYROIDAB in the last 72 hours.  Invalid input(s): FREET3 Anemia work up No results for input(s): VITAMINB12, FOLATE, FERRITIN, TIBC, IRON, RETICCTPCT in the last 72 hours. Urinalysis    Component Value Date/Time   COLORURINE YELLOW 03/12/2024 2002   APPEARANCEUR CLEAR 03/12/2024 2002   LABSPEC 1.012 03/12/2024 2002   PHURINE 5.0 03/12/2024 2002   GLUCOSEU NEGATIVE 03/12/2024 2002   GLUCOSEU NEGATIVE  02/11/2012 0931   HGBUR NEGATIVE 03/12/2024 2002   BILIRUBINUR NEGATIVE 03/12/2024 2002   KETONESUR NEGATIVE 03/12/2024 2002   PROTEINUR NEGATIVE 03/12/2024 2002   UROBILINOGEN 0.2 06/20/2022 1043   NITRITE NEGATIVE 03/12/2024 2002   LEUKOCYTESUR NEGATIVE 03/12/2024 2002   Sepsis Labs Recent Labs  Lab 03/07/24 1531 03/12/24 1710  WBC 8.6 7.1   Microbiology Recent Results (from the past 240 hours)  Resp panel by RT-PCR (RSV, Flu A&B, Covid) Anterior Nasal Swab     Status: None   Collection Time: 03/07/24  5:39 PM   Specimen: Anterior Nasal Swab  Result Value Ref Range Status   SARS Coronavirus 2 by RT PCR NEGATIVE NEGATIVE Final   Influenza A by PCR NEGATIVE NEGATIVE Final   Influenza B by PCR NEGATIVE NEGATIVE Final    Comment: (NOTE) The Xpert Xpress SARS-CoV-2/FLU/RSV plus assay is intended as an aid in the diagnosis of influenza from Nasopharyngeal swab specimens and should not be used as a sole basis for treatment. Nasal washings and aspirates are unacceptable for Xpert Xpress SARS-CoV-2/FLU/RSV testing.  Fact Sheet for Patients: BloggerCourse.com  Fact Sheet for Healthcare Providers: SeriousBroker.it  This test is not yet approved or cleared by the [place]  FDA and has been authorized for detection and/or diagnosis of SARS-CoV-2 by FDA under an Emergency Use Authorization (EUA). This EUA will remain in effect (meaning this test can be used) for the duration of the COVID-19 declaration under Section 564(b)(1) of the Act, 21 U.S.C. section 360bbb-3(b)(1), unless the authorization is terminated or revoked.     Resp Syncytial Virus by PCR NEGATIVE NEGATIVE Final    Comment: (NOTE) Fact Sheet for Patients: BloggerCourse.com  Fact Sheet for Healthcare Providers: SeriousBroker.it  This test is not yet approved or cleared by the [place]  FDA and has been  authorized for detection and/or diagnosis of SARS-CoV-2 by FDA under an Emergency Use  Authorization (EUA). This EUA will remain in effect (meaning this test can be used) for the duration of the COVID-19 declaration under Section 564(b)(1) of the Act, 21 U.S.C. section 360bbb-3(b)(1), unless the authorization is terminated or revoked.  Performed at [facility], [address], [place], [place] [address]   MRSA Next Gen by PCR, Nasal     Status: None   Collection Time: 03/12/24  3:53 PM   Specimen: Nasal Swab  Result Value Ref Range Status   MRSA by PCR Next Gen NOT DETECTED NOT DETECTED Final    Comment: (NOTE) The GeneXpert MRSA Assay (FDA approved for NASAL specimens only), is one component of a comprehensive MRSA colonization surveillance program. It is not intended to diagnose MRSA infection nor to guide or monitor treatment for MRSA infections. Test performance is not FDA approved in patients less than 85 years old. Performed at [facility], [address], [place], [place] [address]   Blood Culture (routine x 2)     Status: None (Preliminary result)   Collection Time: 03/12/24  4:50 PM   Specimen: BLOOD  Result Value Ref Range Status   Specimen Description BLOOD LEFT ANTECUBITAL  Final   Special Requests   Final    BOTTLES DRAWN AEROBIC AND ANAEROBIC Blood Culture adequate volume   Culture   Final    NO GROWTH < 12 HOURS Performed at [facility], [address], [place], [place] [address]    Report Status PENDING  Incomplete  Blood Culture (routine x 2)     Status: None (Preliminary result)   Collection Time: 03/12/24  5:04 PM   Specimen: BLOOD  Result Value Ref Range Status   Specimen Description BLOOD LEFT ANTECUBITAL  Final   Special Requests   Final    BOTTLES DRAWN AEROBIC ONLY Blood Culture results may not be optimal due to an inadequate volume of blood received in culture bottles   Culture   Final    NO GROWTH < 12 HOURS Performed at [facility], [address], [place], [place] [address]    Report Status PENDING  Incomplete  Resp panel by RT-PCR (RSV, Flu A&B, Covid) Anterior Nasal Swab     Status: None   Collection Time: 03/13/24  4:24 AM   Specimen: Anterior Nasal Swab  Result Value Ref Range Status   SARS Coronavirus 2 by RT PCR NEGATIVE NEGATIVE Final   Influenza A by PCR NEGATIVE NEGATIVE Final   Influenza B by PCR NEGATIVE NEGATIVE Final    Comment: (NOTE) The Xpert Xpress SARS-CoV-2/FLU/RSV plus assay is intended as an aid in the diagnosis of influenza from Nasopharyngeal swab specimens and should not be used as a sole basis for treatment. Nasal washings and aspirates are unacceptable for Xpert Xpress SARS-CoV-2/FLU/RSV testing.  Fact Sheet for Patients: BloggerCourse.com  Fact Sheet for Healthcare Providers: SeriousBroker.it  This test is not yet approved or cleared by the [place]  FDA and has been authorized for detection and/or diagnosis of SARS-CoV-2 by FDA under an Emergency Use Authorization (EUA). This EUA will remain in effect (meaning this test can be used) for the duration of the COVID-19 declaration under Section 564(b)(1) of the Act, 21 U.S.C. section 360bbb-3(b)(1), unless the authorization is terminated or revoked.     Resp Syncytial Virus by PCR NEGATIVE NEGATIVE Final    Comment: (NOTE) Fact Sheet for Patients: BloggerCourse.com  Fact Sheet for Healthcare Providers: SeriousBroker.it  This test is not yet  approved or cleared by the [place]  FDA and has been authorized for detection and/or diagnosis of SARS-CoV-2 by FDA under an Emergency Use Authorization (EUA). This EUA will remain in effect (meaning this test can be used) for the duration of the COVID-19 declaration under Section 564(b)(1) of the Act, 21 U.S.C. section 360bbb-3(b)(1), unless the authorization is  terminated or revoked.  Performed at [facility], [address], [place], [place] [address]     FURTHER DISCHARGE INSTRUCTIONS:   Get Medicines reviewed and adjusted: Please take all your medications with you for your next visit with your Primary MD   Laboratory/radiological data: Please request your Primary MD to go over all hospital tests and procedure/radiological results at the follow up, please ask your Primary MD to get all Hospital records sent to his/her office.   In some cases, they will be blood work, cultures and biopsy results pending at the time of your discharge. Please request that your primary care M.D. goes through all the records of your hospital data and follows up on these results.   Also Note the following: If you experience worsening of your admission symptoms, develop shortness of breath, life threatening emergency, suicidal or homicidal thoughts you must seek medical attention immediately by calling 911 or calling your MD immediately  if symptoms less severe.   You must read complete instructions/literature along with all the possible adverse reactions/side effects for all the Medicines you take and that have been prescribed to you. Take any new Medicines after you have completely understood and accpet all the possible adverse reactions/side effects.    patient was instructed, not to drive, operate heavy machinery, perform activities at heights, swimming or participation in water activities or provide baby-sitting services while on Pain, Sleep and Anxiety Medications; until their outpatient Physician has advised to do so again. Also recommended to not to take more than prescribed Pain, Sleep and Anxiety Medications.  It is not advisable to combine anxiety, sleep and pain medications without talking with your primary care provider.     Wear Seat belts while driving.   Please note: You were cared for by a hospitalist during your hospital stay. Once you are  discharged, your primary care physician will handle any further medical issues. Please note that NO REFILLS for any discharge medications will be authorized once you are discharged, as it is imperative that you return to your primary care physician (or establish a relationship with a primary care physician if you do not have one) for your post hospital discharge needs so that they can reassess your need for medications and monitor your lab values  Time coordinating discharge: Over 30 minutes  SIGNED:   Fredia Skeeter, MD  [facility] 03/13/2024, 1:29 PM *Please note that this is a verbal dictation therefore any spelling or grammatical errors are due to the Dragon Medical One system interpretation. If 7PM-7AM, please contact night-coverage [email or web address]

## 2024-03-13 NOTE — Evaluation (Addendum)
 Clinical/Bedside Swallow Evaluation Patient Details  Name: Vincent J Nulty Jr. MRN: [id number] Date of Birth: Sep 08, 1934  Today's Date: 03/13/2024 Time: SLP Start Time (ACUTE ONLY): 9047 SLP Stop Time (ACUTE ONLY): 1002 SLP Time Calculation (min) (ACUTE ONLY): 10 min  Past Medical History:  Past Medical History:  Diagnosis Date   Diabetes mellitus 01/2009 dx   DYSLIPIDEMIA    GERD    Hypertension    Kidney stones    RENAL CALCULUS 01/04/2009   Past Surgical History:  Past Surgical History:  Procedure Laterality Date   NO PAST SURGERIES     HPI:  Pt is an 89 yo male presenting to ED 9/25 with R sided lateral rib pain. CTA Chest shows progressive airway inflammation and extensive bibasilar airway impaction and peripheral consolidation in keeping with multifocal bronchopneumonia or aspiration. PMH includes HTN, COPD, T2DM, renal mass, nephrolithiasis, AAA, CKD 3B, anemia, GERD, THC and tobacco use, BPH    Assessment / Plan / Recommendation  Clinical Impression  Pt upright and feeding himself regular solids and thin liquids without overt s/s of dysphagia. He completed the 3 oz water test without signs clinically concerning for aspiration, though has been admitted multiple times with pneumonia since 2021 per chart review. Eructation frequently followed swallowing so GER and changes related to COPD/tobacco use may also be contributing factors. Recommend proceeding with an MBS for further assessment. Continue current diet pending completion.  Addendum 1600: Discussed with MD, who reports pt will discharge today. Given functional-appearing performance clinically, discussed f/u as an OP for MBS. MD has placed order.  SLP Visit Diagnosis: Dysphagia, unspecified (R13.10)    Aspiration Risk  Mild aspiration risk    Diet Recommendation Regular;Thin liquid    Liquid Administration via: Cup;Straw Medication Administration: Whole meds with liquid Supervision: Patient able to self  feed Compensations: Minimize environmental distractions;Slow rate;Small sips/bites Postural Changes: Seated upright at 90 degrees;Remain upright for at least 30 minutes after po intake    Other  Recommendations Oral Care Recommendations: Oral care BID     Assistance Recommended at Discharge    Functional Status Assessment    Frequency and Duration            Prognosis Prognosis for improved oropharyngeal function: Good      Swallow Study   General HPI: Pt is an 89 yo male presenting to ED 9/25 with R sided lateral rib pain. CTA Chest shows progressive airway inflammation and extensive bibasilar airway impaction and peripheral consolidation in keeping with multifocal bronchopneumonia or aspiration. PMH includes HTN, COPD, T2DM, renal mass, nephrolithiasis, AAA, CKD 3B, anemia, GERD, THC and tobacco use, BPH Type of Study: Bedside Swallow Evaluation Previous Swallow Assessment: none in chart Diet Prior to this Study: Regular;Thin liquids (Level 0) Temperature Spikes Noted: No Respiratory Status: Room air History of Recent Intubation: No Behavior/Cognition: Alert;Cooperative;Pleasant mood Oral Cavity Assessment: Within Functional Limits Oral Care Completed by SLP: No Oral Cavity - Dentition: Missing dentition Vision: Functional for self-feeding Self-Feeding Abilities: Able to feed self Patient Positioning: Upright in bed Baseline Vocal Quality: Normal Volitional Cough: Strong;Congested Volitional Swallow: Able to elicit    Oral/Motor/Sensory Function Overall Oral Motor/Sensory Function: Within functional limits   Ice Chips Ice chips: Not tested   Thin Liquid Thin Liquid: Within functional limits Presentation: Straw;Self Fed    Nectar Thick Nectar Thick Liquid: Not tested   Honey Thick Honey Thick Liquid: Not tested   Puree Puree: Not tested   Solid     Solid: Within functional  limits Presentation: Self Fed      Damien Blumenthal, M.A., CCC-SLP Speech Language Pathology,  Acute Rehabilitation Services  Secure Chat preferred [phone]  03/13/2024,10:47 AM

## 2024-03-13 NOTE — Consult Note (Addendum)
 Hospital Consult    Reason for Consult: PAD Requesting Physician:  Vernon MD MRN #:  [id number]  History of Present Illness: Vincent Baxter is a 89 y.o. male with a PMH of HTN, dyslipidemia, DMII, HF, COPD, AAA, CKD stage III and anemia who presented to [facility] yesterday with right sided rib pain that has been going on for months.  Upon presentation he also endorsed shortness of breath and cough for several weeks.  CTA chest/abdomen/pelvis was obtained and was negative for PE.  Imaging did demonstrate multifocal pneumonia.  Upon admission he also was endorsing bilateral lower extremity pain for a few months.  We were consulted for further evaluation.  This morning he is resting comfortably in bed.  His biggest complaint currently is his right sided rib pain, stiffness in his lower legs and feet, and soreness around the right inner ankle.  He says for a couple of months now he has had aching pain that feels like it starts around his right inner ankle and goes all the way up his body until it reaches his ribs.  He says when he is walking his legs feel very stiff and globally weak.  He does not describe any explicit symptoms of claudication, such as leg cramping or aching that stops him from walking.  He denies any rest pain or tissue loss.  Past Medical History:  Diagnosis Date   Diabetes mellitus 01/2009 dx   DYSLIPIDEMIA    GERD    Hypertension    Kidney stones    RENAL CALCULUS 01/04/2009    Past Surgical History:  Procedure Laterality Date   NO PAST SURGERIES      No Known Allergies  Prior to Admission medications   Medication Sig Start Date End Date Taking? Authorizing Provider  acetaminophen  (TYLENOL ) 500 MG tablet Take 1 tablet (500 mg total) by mouth every 6 (six) hours as needed for moderate pain (pain score 4-6). 03/07/24   Doretha Folks, MD  amLODipine  (NORVASC ) 5 MG tablet Take 1 tablet (5 mg total) by mouth daily. 02/24/24   Cleotilde Rogue, MD  amoxicillin  (AMOXIL )  500 MG tablet Take 500 mg by mouth 4 (four) times daily. 03/10/24   [provider]  diclofenac  Sodium (VOLTAREN ) 1 % GEL Apply 4 g topically 4 (four) times daily. 01/16/24   White, Elizabeth A, PA-C  HYDROcodone -acetaminophen  (NORCO/VICODIN) 5-325 MG tablet Take 1 tablet by mouth every 6 (six) hours as needed for moderate pain (pain score 4-6). 07/29/23   Suzette Pac, MD  lidocaine  (LIDODERM ) 5 % Place 1 patch onto the skin daily. Remove & Discard patch within 12 hours or as directed by MD 01/16/24   Teresa Almarie LABOR, PA-C  tamsulosin  (FLOMAX ) 0.4 MG CAPS capsule Take 1 capsule (0.4 mg total) by mouth daily. 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  Retired [employer]   Social Drivers of Corporate investment banker Strain: Not on [employer] Insecurity: No Food Insecurity (03/13/2024)   Hunger Vital Sign    Worried About Running Out of Food in the Last Year: Never true    Ran Out of Food in the Last Year: Never true  Transportation Needs: No Transportation Needs (03/13/2024)   PRAPARE - Administrator, Civil Service (Medical): No    Lack of Transportation (Non-Medical): No  Physical Activity: Not on file  Stress: Not on file  Social Connections: Socially Isolated (03/13/2024)   Social Connection and Isolation  Panel    Frequency of Communication with Friends and Family: Twice a week    Frequency of Social Gatherings with Friends and Family: Never    Attends Religious Services: Never    Database administrator or Organizations: No    Attends Banker Meetings: Never    Marital Status: Never married  Intimate Partner Violence: Not At Risk (03/13/2024)   Humiliation, Afraid, Rape, and Kick questionnaire    Fear of Current or Ex-Partner: No    Emotionally Abused: No    Physically Abused: No    Sexually Abused: No    Family History  Problem Relation Age of Onset   Hypertension Mother     ROS: Otherwise negative unless mentioned in HPI  Physical Examination  Vitals:   03/13/24 0300 03/13/24 0455  BP: 116/84 135/87  Pulse:  78  Resp: 15 16  Temp:  (!) 97.5 F (36.4 C)  SpO2:  96%   Body mass index is 17.75 kg/m.  General: Chronically ill-appearing male in no acute distress Gait: Not observed HENT: WNL, normocephalic Pulmonary: normal non-labored breathing Cardiac: Regular Abdomen:  soft, NT/ND, no masses Skin: without rashes Vascular Exam/Pulses: Palpable femoral and popliteal pulses bilaterally.  Nonpalpable pedal pulses.  Brisk bilateral DP/PT/peroneal Doppler signals Extremities: without ischemic changes, without Gangrene , without cellulitis; without open wounds;  Musculoskeletal: no muscle wasting or atrophy  Neurologic: A&O X 3;  No focal weakness or paresthesias are detected; speech is fluent/normal Psychiatric:  The pt has Normal affect. Lymph:  Unremarkable  CBC    Component Value Date/Time   WBC 7.1 03/12/2024 1710   RBC 4.02 (L) 03/12/2024 1710   HGB 13.3 03/12/2024 1710   HCT 39.0 03/12/2024 1710   PLT 144 (L) 03/12/2024 1710   MCV 97.0 03/12/2024 1710   MCH 33.1 03/12/2024 1710   MCHC 34.1 03/12/2024 1710   RDW 13.1 03/12/2024 1710   LYMPHSABS 0.8 03/12/2024 1710   MONOABS 0.4 03/12/2024 1710   EOSABS 0.1 03/12/2024 1710   BASOSABS 0.0  03/12/2024 1710    BMET    Component Value Date/Time   NA 134 (L) 03/13/2024 0424   K 4.9 03/13/2024 0424   CL 107 03/13/2024 0424   CO2 20 (L) 03/13/2024 0424   GLUCOSE 183 (H) 03/13/2024 0424   BUN 42 (H) 03/13/2024 0424   CREATININE 1.53 (H) 03/13/2024 0424   CALCIUM  8.2 (L) 03/13/2024 0424   GFRNONAA 44 (L) 03/13/2024 0424   GFRAA  12/12/2009 2237    >60        The eGFR has been calculated using the MDRD equation. This calculation has not been validated in all clinical situations.  eGFR's persistently <60 mL/min signify possible Chronic Kidney Disease.    COAGS: Lab Results  Component Value Date   INR 1.0 03/12/2024     Non-Invasive Vascular Imaging:   ABIs ordered  CTA chest/abd/pelvis 1. No evidence of pulmonary embolism. 2. Progressive airway inflammation and extensive bibasilar airway impaction and peripheral consolidation in keeping with multifocal bronchopneumonia or aspiration. 3. Stable fusiform infrarenal abdominal aortic aneurysm measuring 4.2 x 4.9 cm with circumferential mural thrombus. No aortic dissection. 4. 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 5. Marked asymmetric atrophy of the right kidney 6. 50% stenosis of the mid left renal artery 7. Advanced emphysema. Marked body wall wasting and loss of retroperitoneal and intraabdominal fat.    ASSESSMENT/PLAN: This is a 89 y.o. male admitted with multifocal pneumonia   - The patient was admitted to [facility] yesterday with several months of right rib pain and worsening shortness of breath and cough.  CTA demonstrated multifocal pneumonia -Upon admission he was also complaining of bilateral lower extremity pain.  ABIs were ordered and we were consulted for further evaluation -The patient says over the past several months he has had aching pain around his right inner ankle that  extends all the way up his leg and torso until his right lower ribs.  He says that all of this area on the right feels generally sore and is worse with exertion.  He also reports bilateral lower extremity stiffness and weakness.  He does not endorse symptoms typical for claudication.  He denies any rest pain or tissue loss. - On exam he has palpable femoral and popliteal pulses bilaterally.  He does not have palpable pedal pulses.  He does have brisk DP/PT/peroneal Doppler signals bilaterally - Based on exam there is no concern for acute or critical limb ischemia.  He does have evidence of PAD with likely tibial disease.  At this time his symptoms do not seem very consistent with PAD, however we will follow-up on his ABIs and follow him in the outpatient setting.  He does not require any vascular intervention at this time. - CTA also demonstrates stable size of a 5.0 cm AAA.  He is asymptomatic.  This also does not require intervention at this time   Ahmed Holster PA-C [facility] [phone]   I have seen and evaluated the patient. I agree with the PA note as documented above.  89 year old male that we were consulted for PAD.  Admitted last night with pneumonia.  Did get a CTA showing 5 cm abdominal aortic aneurysm.  He has no abdominal pain.  This can be followed as an outpatient.  In men we repair there is greater than 5.5 cm and his aneurysm does not meet criteria for repair.SABRA  He has palpable femoral pulses and brisk Doppler signals in both feet.  He has no wounds.  He complains of his right leg feeling stiff and having weakness but I do not think this is a primary arterial problem.  He really does not have classic claudication symptoms.  He can get ABIs and likely follow-up as an outpatient.  Lonni DOROTHA Gaskins, MD [facility] [place] Office: [phone]

## 2024-03-13 NOTE — Progress Notes (Addendum)
 Discharge Nurse Summary: DC order noted per MD. DC RN at bedside with patient. Patient agreeable with discharge plan, states he drove himself to the hospital. With further instruction regarding his care, it became repeatedly difficult to communicate with the patient given limited ability to comprehend simple instruction, impaired memory regarding his medical care, agitation, and frequent interruption/lack of trust of clinicians insight to coordinate his care. After careful instruction, patient informed of plan to reach out to [facility] CM and provider to ensure his medications are provided and determine safe discharge option/transportation given limited/impaired mobility, patient agreeable.   At 1621 - No PT/OT eval noted for the patient. Dr. Vernon paged via hospitalist exchange  for orders pending discharge. Per discussion with Vinie PT, PT/OT unable to see the patient today.   At 1706 - Attempted to reach Dr Vernon via hospitalist exchange with no answer, LVM informing the team the patient was unable to be seen by PT/OT today and needs all meds sent to [facility] upon discharge as the patient is unsure which meds he has at home. This patient will need more support to manage his care outpatient as he states he does not have any family support. Telemonitor returned to charging station. Per discussion with TOC Rx, pt is only known to have recent fill of amlodipine , acetaminophen , and prednisone . TOC Rx recommends the med rec team eval the patient prior to dc to ensure the appropriate refills are ordered to [facility] on discharge. Patient/Primary RN/Charge informed of change in patient discharge plan.  Rosario EMERSON Lund, RN

## 2024-03-13 NOTE — Progress Notes (Signed)
  Echocardiogram 2D Echocardiogram has been performed.  Vincent Baxter 03/13/2024, 9:08 AM

## 2024-03-14 DIAGNOSIS — J439 Emphysema, unspecified: Secondary | ICD-10-CM | POA: Diagnosis present

## 2024-03-14 DIAGNOSIS — I2489 Other forms of acute ischemic heart disease: Secondary | ICD-10-CM | POA: Diagnosis present

## 2024-03-14 DIAGNOSIS — N1832 Chronic kidney disease, stage 3b: Secondary | ICD-10-CM | POA: Diagnosis present

## 2024-03-14 DIAGNOSIS — K551 Chronic vascular disorders of intestine: Secondary | ICD-10-CM | POA: Diagnosis present

## 2024-03-14 DIAGNOSIS — E1151 Type 2 diabetes mellitus with diabetic peripheral angiopathy without gangrene: Secondary | ICD-10-CM | POA: Diagnosis present

## 2024-03-14 DIAGNOSIS — Z1152 Encounter for screening for COVID-19: Secondary | ICD-10-CM | POA: Diagnosis not present

## 2024-03-14 DIAGNOSIS — J188 Other pneumonia, unspecified organism: Secondary | ICD-10-CM | POA: Diagnosis present

## 2024-03-14 DIAGNOSIS — J440 Chronic obstructive pulmonary disease with acute lower respiratory infection: Secondary | ICD-10-CM | POA: Diagnosis present

## 2024-03-14 DIAGNOSIS — F09 Unspecified mental disorder due to known physiological condition: Secondary | ICD-10-CM | POA: Diagnosis present

## 2024-03-14 DIAGNOSIS — F17210 Nicotine dependence, cigarettes, uncomplicated: Secondary | ICD-10-CM | POA: Diagnosis present

## 2024-03-14 DIAGNOSIS — Z681 Body mass index (BMI) 19 or less, adult: Secondary | ICD-10-CM | POA: Diagnosis not present

## 2024-03-14 DIAGNOSIS — Z8249 Family history of ischemic heart disease and other diseases of the circulatory system: Secondary | ICD-10-CM | POA: Diagnosis not present

## 2024-03-14 DIAGNOSIS — I7143 Infrarenal abdominal aortic aneurysm, without rupture: Secondary | ICD-10-CM | POA: Diagnosis present

## 2024-03-14 DIAGNOSIS — N400 Enlarged prostate without lower urinary tract symptoms: Secondary | ICD-10-CM | POA: Diagnosis present

## 2024-03-14 DIAGNOSIS — E1122 Type 2 diabetes mellitus with diabetic chronic kidney disease: Secondary | ICD-10-CM | POA: Diagnosis present

## 2024-03-14 DIAGNOSIS — I5022 Chronic systolic (congestive) heart failure: Secondary | ICD-10-CM | POA: Diagnosis present

## 2024-03-14 DIAGNOSIS — E785 Hyperlipidemia, unspecified: Secondary | ICD-10-CM | POA: Diagnosis present

## 2024-03-14 DIAGNOSIS — L89152 Pressure ulcer of sacral region, stage 2: Secondary | ICD-10-CM | POA: Diagnosis present

## 2024-03-14 DIAGNOSIS — I70213 Atherosclerosis of native arteries of extremities with intermittent claudication, bilateral legs: Secondary | ICD-10-CM | POA: Diagnosis present

## 2024-03-14 DIAGNOSIS — R64 Cachexia: Secondary | ICD-10-CM | POA: Diagnosis present

## 2024-03-14 DIAGNOSIS — E875 Hyperkalemia: Secondary | ICD-10-CM | POA: Diagnosis present

## 2024-03-14 DIAGNOSIS — J180 Bronchopneumonia, unspecified organism: Secondary | ICD-10-CM | POA: Diagnosis present

## 2024-03-14 DIAGNOSIS — J690 Pneumonitis due to inhalation of food and vomit: Secondary | ICD-10-CM | POA: Diagnosis present

## 2024-03-14 DIAGNOSIS — J189 Pneumonia, unspecified organism: Secondary | ICD-10-CM | POA: Diagnosis not present

## 2024-03-14 DIAGNOSIS — I130 Hypertensive heart and chronic kidney disease with heart failure and stage 1 through stage 4 chronic kidney disease, or unspecified chronic kidney disease: Secondary | ICD-10-CM | POA: Diagnosis present

## 2024-03-14 DIAGNOSIS — K219 Gastro-esophageal reflux disease without esophagitis: Secondary | ICD-10-CM | POA: Diagnosis present

## 2024-03-14 LAB — LEGIONELLA PNEUMOPHILA SEROGP 1 UR AG: L. pneumophila Serogp 1 Ur Ag: NEGATIVE

## 2024-03-14 LAB — GLUCOSE, CAPILLARY
Glucose-Capillary: 117 mg/dL — ABNORMAL HIGH (ref 70–99)
Glucose-Capillary: 138 mg/dL — ABNORMAL HIGH (ref 70–99)
Glucose-Capillary: 152 mg/dL — ABNORMAL HIGH (ref 70–99)
Glucose-Capillary: 131 mg/dL — ABNORMAL HIGH (ref 70–99)

## 2024-03-14 MED ORDER — ENSURE PLUS HIGH PROTEIN PO LIQD
237.0000 mL | Freq: Two times a day (BID) | ORAL | Status: DC
  Administered 2024-03-15 – 2024-03-18 (×7): 237 mL via ORAL

## 2024-03-14 MED ORDER — AZITHROMYCIN 500 MG PO TABS
500.0000 mg | ORAL_TABLET | Freq: Every day | ORAL | Status: AC
  Administered 2024-03-14 – 2024-03-16 (×3): 500 mg via ORAL
  Filled 2024-03-14 (×3): qty 1

## 2024-03-14 MED ORDER — HYDROCODONE BIT-HOMATROP MBR 5-1.5 MG/5ML PO SOLN
5.0000 mL | Freq: Once | ORAL | Status: AC
  Administered 2024-03-14: 5 mL via ORAL
  Filled 2024-03-14: qty 5

## 2024-03-14 NOTE — TOC Initial Note (Signed)
 Transition of Care (TOC) - Initial/Assessment Note    Patient Details  Name: Vincent Baxter. MRN: [id number] Date of Birth: 19-Feb-1935  Transition of Care [facility]) CM/SW Contact:    Cena Ligas, LCSW Phone Number: 03/14/2024, 3:41 PM  Clinical Narrative:                  CSW received SNF consult. CSW met with pt at bedside. CSW introduced self and explained role at the hospital. Pt reports that PTA he was living at home alone and was independent with mobility and ADL's.  CSW reviewed PT/OT recommendations for SNF. Pt reports he is fine with SNF. Pt gave CSW permission to fax out to facilities in the area. Pt has no preference of facility at this time. CSW gave pt medicare.gov rating list to review. CSW explained insurance auth process.  CSW will continue to follow.   Expected Discharge Plan: Skilled Nursing Facility Barriers to Discharge: Continued Medical Work up   Patient Goals and CMS Choice Patient states their goals for this hospitalization and ongoing recovery are:: SNF CMS Medicare.gov Compare Post Acute Care list provided to:: Patient Choice offered to / list presented to : Patient      Expected Discharge Plan and Services       Living arrangements for the past 2 months: Skilled Nursing Facility Expected Discharge Date: 03/13/24                                    Prior Living Arrangements/Services Living arrangements for the past 2 months: Skilled Nursing Facility Lives with:: Self Patient language and need for interpreter reviewed:: No Do you feel safe going back to the place where you live?: Yes      Need for Family Participation in Patient Care: No (Comment) Care giver support system in place?: No (comment)   Criminal Activity/Legal Involvement Pertinent to Current Situation/Hospitalization: Yes - Comment as needed  Activities of Daily Living   ADL Screening (condition at time of admission) Independently performs ADLs?: Yes (appropriate for  developmental age) Is the patient deaf or have difficulty hearing?: No Does the patient have difficulty seeing, even when wearing glasses/contacts?: No Does the patient have difficulty concentrating, remembering, or making decisions?: No  Permission Sought/Granted   Permission granted to share information with : Yes, Verbal Permission Granted     Permission granted to share info w AGENCY: SNF        Emotional Assessment Appearance:: Appears stated age Attitude/Demeanor/Rapport: Guarded Affect (typically observed): Stable Orientation: : Oriented to Self, Oriented to Place, Oriented to  Time, Oriented to Situation Alcohol / Substance Use: Not Applicable Psych Involvement: No (comment)  Admission diagnosis:  Multifocal pneumonia [J18.9] Patient Active Problem List   Diagnosis Date Noted   Claudication of both lower extremities 03/13/2024   CKD (chronic kidney disease) stage 3, GFR 30-59 ml/min (HCC) 03/13/2024   Multifocal pneumonia 03/12/2024   Cardiomyopathy (HCC) 04/19/2022   Left ventricular hypertrophy 04/19/2022   Renal mass 03/23/2022   Rib pain 03/23/2022   Bradycardia 03/22/2022   Abnormal EKG    PAC (premature atrial contraction)    PVC (premature ventricular contraction)    Smoking    Right flank pain 05/02/2020   Elevated troponin 05/01/2020   New onset of congestive heart failure (HCC) 05/01/2020   AAA (abdominal aortic aneurysm)    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  ANOREXIA 02/23/2009   DYSLIPIDEMIA 01/18/2009   DM2 (diabetes mellitus, type 2) (HCC) 01/10/2009   GERD 01/10/2009   Essential hypertension 01/04/2009   RENAL CALCULUS 01/04/2009   PCP:  Clinic, [facility] Pharmacy:   D[facility] LONG - [facility] [address] [place] [address] Phone: [phone] Fax: [phone]  [id number] [place], [place] - [address] [place]  [address] Phone: [phone] Fax: [phone]  [facility] [address] [place] [address] Phone: [phone] Fax: [phone]     Social Drivers of Health (SDOH) Social History: SDOH Screenings   Food Insecurity: No Food Insecurity (03/14/2024)  Housing: Low Risk  (03/14/2024)  Transportation Needs: No Transportation Needs (03/14/2024)  Utilities: Not At Risk (03/14/2024)  Social Connections: Unknown (03/14/2024)  Recent Concern: Social Connections - Socially Isolated (03/13/2024)  Tobacco Use: High Risk (03/12/2024)   SDOH Interventions:     Readmission Risk Interventions     No data to display

## 2024-03-14 NOTE — NC FL2 (Signed)
 [place]  MEDICAID FL2 LEVEL OF CARE FORM     IDENTIFICATION  Patient Name: Vincent Baxter. Birthdate: 12/13/1934 Sex: male Admission Date (Current Location): 03/12/2024  [facility] and [place] Number:  Producer, television/film/video and Address:  [facility] [place], [address] [place], [place] [address]      Provider Number: [id number]  Attending Physician Name and Address:  Vernon Ranks, MD  Relative Name and Phone Number:       Current Level of Care: Hospital Recommended Level of Care: Skilled Nursing Facility Prior Approval Number:    Date Approved/Denied:   PASRR Number: [id number]  Discharge Plan: SNF    Current Diagnoses: Patient Active Problem List   Diagnosis Date Noted   Claudication of both lower extremities 03/13/2024   CKD (chronic kidney disease) stage 3, GFR 30-59 ml/min (HCC) 03/13/2024   Multifocal pneumonia 03/12/2024   Cardiomyopathy (HCC) 04/19/2022   Left ventricular hypertrophy 04/19/2022   Renal mass 03/23/2022   Rib pain 03/23/2022   Bradycardia 03/22/2022   Abnormal EKG    PAC (premature atrial contraction)    PVC (premature ventricular contraction)    Smoking    Right flank pain 05/02/2020   Elevated troponin 05/01/2020   New onset of congestive heart failure (HCC) 05/01/2020   AAA (abdominal aortic aneurysm)    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   ANOREXIA 02/23/2009   DYSLIPIDEMIA 01/18/2009   DM2 (diabetes mellitus, type 2) (HCC) 01/10/2009   GERD 01/10/2009   Essential hypertension 01/04/2009   RENAL CALCULUS 01/04/2009    Orientation RESPIRATION BLADDER Height & Weight     Self, Time, Situation, Place  Normal Continent, External catheter Weight: 110 lb (49.9 kg) Height:  5' 6 (167.6 cm)  BEHAVIORAL SYMPTOMS/MOOD NEUROLOGICAL BOWEL NUTRITION STATUS      Continent Diet  AMBULATORY STATUS COMMUNICATION OF NEEDS Skin   Limited Assist Verbally PU Stage and Appropriate  Care                       Personal Care Assistance Level of Assistance  Feeding, Bathing, Dressing Bathing Assistance: Limited assistance Feeding assistance: Independent Dressing Assistance: Limited assistance     Functional Limitations Info  Sight, Hearing, Speech Sight Info: Adequate Hearing Info: Adequate Speech Info: Adequate    SPECIAL CARE FACTORS FREQUENCY  PT (By licensed PT), OT (By licensed OT)     PT Frequency: 5x a week OT Frequency: 5x a week            Contractures Contractures Info: Not present    Additional Factors Info  Code Status, Allergies Code Status Info: Full Allergies Info: NKA           Current Medications (03/14/2024):  This is the current hospital active medication list Current Facility-Administered Medications  Medication Dose Route Frequency Provider Last Rate Last Admin   acetaminophen  (TYLENOL ) tablet 650 mg  650 mg Oral Q6H PRN Rathore, Vasundhra, MD   650 mg at 03/14/24 0539   Or   acetaminophen  (TYLENOL ) suppository 650 mg  650 mg Rectal Q6H PRN Alfornia Madison, MD       azithromycin  (ZITHROMAX ) tablet 500 mg  500 mg Oral Daily Pahwani, Ravi, MD       cefTRIAXone  (ROCEPHIN ) 1 g in sodium chloride  0.9 % 100 mL IVPB  1 g Intravenous Q24H Vernon Ranks, MD   Paused at 03/13/24 2025   heparin  injection 5,000 Units  5,000 Units  Subcutaneous Q8H Rathore, Vasundhra, MD   5,000 Units at 03/14/24 1400   insulin  aspart (novoLOG ) injection 0-5 Units  0-5 Units Subcutaneous QHS Rathore, Vasundhra, MD       insulin  aspart (novoLOG ) injection 0-9 Units  0-9 Units Subcutaneous TID WC Rathore, Vasundhra, MD   1 Units at 03/14/24 1230   ipratropium-albuterol  (DUONEB) 0.5-2.5 (3) MG/3ML nebulizer solution 3 mL  3 mL Nebulization Q6H PRN Alfornia Madison, MD         Discharge Medications: Please see discharge summary for a list of discharge medications.  Relevant Imaging Results:  Relevant Lab Results:   Additional  Information SSN: [id number]  Cena Ligas, LCSW

## 2024-03-14 NOTE — Progress Notes (Signed)
 [facility] [place]  Subjective  -complains of stiffness in his right leg   Objective 101/72 79 97.8 F (36.6 C) 18 97%  Intake/Output Summary (Last 24 hours) at 03/14/2024 0901 Last data filed at 03/14/2024 0600 Gross per 24 hour  Intake 380.52 ml  Output 600 ml  Net -219.48 ml    No abdominal pain Bilateral femoral pulses palpable  Laboratory Lab Results: Recent Labs    03/12/24 1710  WBC 7.1  HGB 13.3  HCT 39.0  PLT 144*   BMET Recent Labs    03/12/24 1710 03/13/24 0424  NA 135 134*  K 5.4* 4.9  CL 104 107  CO2 19* 20*  GLUCOSE 170* 183*  BUN 47* 42*  CREATININE 1.71* 1.53*  CALCIUM  9.0 8.2*    COAG Lab Results  Component Value Date   INR 1.0 03/12/2024   No results found for: PTT  Assessment/Planning:  89 year old male that we were consulted for PAD yesterday.  He complains mainly of right leg stiffness.  His ABIs are normal on the right leg and he does not describe classic claudication symptoms.  I do not think he needs any intervention for arterial insufficiency.  Defer to hospitalist for further workup.  Abdominal aortic aneurysm is 5 cm.  Will need follow-up in the office for surveillance as we discussed.  Discussed in men we repair these at greater than 5.5 cm.  Vascular will sign off.  Follow-up arranged in the office.  Lonni JINNY Gaskins 03/14/2024 9:01 AM --

## 2024-03-14 NOTE — Progress Notes (Signed)
 PROGRESS NOTE    Vincent Baxter.  FMW:[id number] DOB: 22-May-1935 DOA: 03/12/2024 PCP: [facility]   Brief Narrative:  HPI: Vincent Baxter. is a 89 y.o. male with medical history significant of hypertension, HFmrEF, COPD, type 2 diabetes, renal mass, nephrolithiasis, AAA, CKD stage IIIb, anemia, GERD, THC and tobacco abuse, BPH presenting with a chief complaint of right-sided lateral rib pain which has been ongoing for several months.  He denies any recent falls or trauma.  He is also reporting shortness of breath and cough for several weeks.  Denies fevers.  Patient states he is not able to taste food and has lost his appetite over the past few months.  Reports losing weight but he is not sure how much.  He is also endorsing bilateral leg pain which is worse with exertion x 3 months.  No other complaints.   ED course: Vital signs stable.  EKG showing sinus rhythm and no acute ischemic changes.  Labs showing no leukocytosis, hemoglobin normal, platelet count 144k (chronically low and no significant change from baseline), potassium 5.4, bicarb 19 (chronically low and stable), anion gap 12, glucose 170, BUN 47, creatinine 1.7 (stable), normal lipase and LFTs, lactic acid normal x 2, blood cultures in process, troponin 108> 105, BNP 409, UA not suggestive of infection.    Assessment & Plan:   Principal Problem:   Multifocal pneumonia Active Problems:   DM2 (diabetes mellitus, type 2) (HCC)   COPD (chronic obstructive pulmonary disease) with emphysema (HCC)   Claudication of both lower extremities   CKD (chronic kidney disease) stage 3, GFR 30-59 ml/min (HCC)  Multifocal pneumonia  CT chest showing progressive airway inflammation and extensive bibasilar airway impaction and peripheral consolidation suspicious for multifocal bronchopneumonia versus aspiration.  No fever, leukocytosis, or lactic acidosis.  No signs of sepsis.  Patient not hypoxic. Test sent for strep  pneumo/Legionella urinary antigens and pending.  Test for COVID/flu/RSV negative.  Patient has no complaints of shortness of breath or cough today and no problem swallowing.  He was never hypoxic.  Lungs clear to auscultation on my examination.  Continue Rocephin  and Zithromax  for total of 5 days.   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  In addition, showing 50% stenosis of the mid left renal artery.  Consulted vascular surgery, ABI obtained which are normal according to vascular surgery.  Per them, he does not have any signs of claudication, PAD, does not need any intervention.  CTA showing 5 cm abdominal aortic aneurysm.  He has no abdominal pain.  Vascular surgery recommends outpatient follow-up.  Per them, in men they repair there is greater than 5.5 cm and his aneurysm does not meet criteria for repair.  He mainly complains of bilateral leg stiffness.  Seen by PT OT, they recommended SNF.  TOC aware.   Right-sided lateral rib pain Appears to be chronic/ongoing for several months.  Patient denies any recent falls or trauma.  No evidence of rib fractures on chest imaging.   Elevated troponin Likely due to demand ischemia.  Troponin elevated but stable, pattern not consistent with ACS.  EKG without acute ischemic changes.  Patient is not endorsing any substernal or left-sided chest pain/pressure.   Mild hyperkalemia in the setting of CKD stage IIIb No acute EKG changes.  A dose of  Lokelma  given, potassium normalized.   Unintentional weight loss CT chest/abdomen/pelvis without any findings to suggest malignancy.   Chronic HFmrEF Last echo done in October 2023 showing EF 40 to 45%, grade 2 diastolic dysfunction, severe concentric LVH concerning for possible  HCM versus amyloidosis, moderate biatrial dilation, and trivial mitral regurgitation.  BNP 409 but patient does not appear volume overloaded on exam.  No evidence of pulmonary edema on imaging.  Repeat echocardiogram shows EF of 25 to 30% and grade 2 diastolic dysfunction.  Patient is asymptomatic as far as CHF goes.  [facility]  Recommended close follow-up with cardiology.  DVT prophylaxis: heparin  injection 5,000 Units Start: 03/13/24 0600   Code Status: Full Code  Family Communication:  None present at bedside.  Plan of care discussed with patient in length and he/she verbalized understanding and agreed with it.  Status is: Observation The patient will require care spanning > 2 midnights and should be moved to inpatient because: Medically stable, needs discharge to SNF, TOC working.   Estimated body mass index is 17.75 kg/m as calculated from the following:   Height as of this encounter: 5' 6 (1.676 m).   Weight as of this encounter: 49.9 kg.  Wound 03/13/24 0500 Pressure Injury Sacrum Stage 2 -  Partial thickness loss of dermis presenting as a shallow open injury with a red, pink wound bed without slough. (Active)   Nutritional Assessment: Body mass index is 17.75 kg/m.SABRA Seen by dietician.  I agree with the assessment and plan as outlined below: Nutrition Status:        . Skin Assessment: I have examined the patient's skin and I agree with the wound assessment as performed by the wound care RN as outlined below: Wound 03/13/24 0500 Pressure Injury Sacrum Stage 2 -  Partial thickness loss of dermis presenting as a shallow open injury with a red, pink wound bed without slough. (Active)    Consultants:  Vascular surgery-signed off  Procedures:  None  Antimicrobials:  Anti-infectives (From admission, onward)    Start     Dose/Rate Route Frequency Ordered Stop   03/13/24 2200  azithromycin  (ZITHROMAX ) 500 mg in sodium chloride  0.9 % 250 mL IVPB        500 mg 250  mL/hr over 60 Minutes Intravenous Every 24 hours 03/13/24 0053     03/13/24 2000  cefTRIAXone  (ROCEPHIN ) 1 g in sodium chloride  0.9 % 100 mL IVPB        1 g 200 mL/hr over 30 Minutes Intravenous Every 24 hours 03/13/24 0053     03/13/24 0000  amoxicillin -clavulanate (AUGMENTIN ) 500-125 MG tablet       Note to Pharmacy: ok change to 500mg  bid per verbal from dwayne rph/dr Audreana Hancox--hja   1 tablet Oral 2 times daily 03/13/24 1328 03/20/24 2359   03/12/24 2045  cefTRIAXone  (ROCEPHIN ) 2 g in sodium chloride  0.9 % 100 mL IVPB        2 g 200 mL/hr over 30 Minutes Intravenous  Once 03/12/24 2033 03/12/24 2142   03/12/24 2045  azithromycin  (ZITHROMAX ) 500 mg in sodium chloride  0.9 % 250 mL IVPB        500 mg 250 mL/hr over 60 Minutes Intravenous  Once 03/12/24 2033 03/12/24 2302         Subjective: Patient seen and examined.  He was working with PT OT.  Only complaint he has his leg stiffness again and no shortness of breath at all.  Of note, I had  discharged this patient yesterday.  Patient was hospitalized less than 24 hours and he did not mention that he lives alone.  There was no indication to consult PT OT.  However when the nurses went to provide discharge paperwork to him, he mentioned to them that he barely can walk and lives alone.  This was late in the day on 03/13/2024.  Nurses tried to get PT OT to see him however they had already gone for the day and for that reason, patient ended up staying in the hospital.  Patient prefers to go to SNF and PT also recommends such.  TOC on board now.  Objective: Vitals:   03/13/24 2342 03/14/24 0355 03/14/24 0414 03/14/24 0830  BP: 96/72 (!) 82/57 94/67 101/72  Pulse: 88 78 81 79  Resp: 18 18  18   Temp: 98.6 F (37 C) 99.3 F (37.4 C)  97.8 F (36.6 C)  TempSrc: Oral Oral    SpO2: 96% 96% 96% 97%  Weight:      Height:        Intake/Output Summary (Last 24 hours) at 03/14/2024 1119 Last data filed at 03/14/2024 1000 Gross per 24 hour  Intake  380.52 ml  Output 725 ml  Net -344.48 ml   Filed Weights   03/12/24 1619  Weight: 49.9 kg    Examination:  General exam: Appears calm and comfortable  Respiratory system: Clear to auscultation. Respiratory effort normal. Cardiovascular system: S1 & S2 heard, RRR. No JVD, murmurs, rubs, gallops or clicks. No pedal edema. Gastrointestinal system: Abdomen is nondistended, soft and nontender. No organomegaly or masses felt. Normal bowel sounds heard. Central nervous system: Alert and oriented. No focal neurological deficits. Extremities: Symmetric 5 x 5 power. Skin: No rashes, lesions or ulcers.   Data Reviewed: I have personally reviewed following labs and imaging studies  CBC: Recent Labs  Lab 03/07/24 1531 03/12/24 1710  WBC 8.6 7.1  NEUTROABS  --  5.7  HGB 12.2* 13.3  HCT 35.2* 39.0  MCV 97.5 97.0  PLT 153 144*   Basic Metabolic Panel: Recent Labs  Lab 03/07/24 1531 03/12/24 1710 03/13/24 0424  NA 138 135 134*  K 4.5 5.4* 4.9  CL 112* 104 107  CO2 17* 19* 20*  GLUCOSE 160* 170* 183*  BUN 30* 47* 42*  CREATININE 1.82* 1.71* 1.53*  CALCIUM  8.8* 9.0 8.2*   GFR: Estimated Creatinine Clearance: 24.5 mL/min (A) (by C-G formula based on SCr of 1.53 mg/dL (H)). Liver Function Tests: Recent Labs  Lab 03/07/24 1531 03/12/24 1710  AST 17 16  ALT 17 14  ALKPHOS 77 88  BILITOT 0.8 0.5  PROT 6.2* 6.7  ALBUMIN 3.3* 3.5   Recent Labs  Lab 03/07/24 1531 03/12/24 1710  LIPASE 12 20   No results for input(s): AMMONIA in the last 168 hours. Coagulation Profile: Recent Labs  Lab 03/12/24 1710  INR 1.0   Cardiac Enzymes: No results for input(s): CKTOTAL, CKMB, CKMBINDEX, TROPONINI in the last 168 hours. BNP (last 3 results) No results for input(s): PROBNP in the last 8760 hours. HbA1C: Recent Labs    03/13/24 0424  HGBA1C 6.4*   CBG: Recent Labs  Lab 03/13/24 0859 03/13/24 1336 03/13/24 1653 03/13/24 2047 03/14/24 0815  GLUCAP 172*  121* 179* 72 117*   Lipid Profile: No results for input(s): CHOL, HDL, LDLCALC, TRIG, CHOLHDL, LDLDIRECT in the last 72 hours. Thyroid  Function Tests: No results for input(s): TSH, T4TOTAL, FREET4, T3FREE, THYROIDAB in the last 72 hours.  Anemia Panel: No results for input(s): VITAMINB12, FOLATE, FERRITIN, TIBC, IRON, RETICCTPCT in the last 72 hours. Sepsis Labs: Recent Labs  Lab 03/12/24 1655 03/12/24 2018  LATICACIDVEN 0.9 1.0    Recent Results (from the past 240 hours)  Resp panel by RT-PCR (RSV, Flu A&B, Covid) Anterior Nasal Swab     Status: None   Collection Time: 03/07/24  5:39 PM   Specimen: Anterior Nasal Swab  Result Value Ref Range Status   SARS Coronavirus 2 by RT PCR NEGATIVE NEGATIVE Final   Influenza A by PCR NEGATIVE NEGATIVE Final   Influenza B by PCR NEGATIVE NEGATIVE Final    Comment: (NOTE) The Xpert Xpress SARS-CoV-2/FLU/RSV plus assay is intended as an aid in the diagnosis of influenza from Nasopharyngeal swab specimens and should not be used as a sole basis for treatment. Nasal washings and aspirates are unacceptable for Xpert Xpress SARS-CoV-2/FLU/RSV testing.  Fact Sheet for Patients: BloggerCourse.com  Fact Sheet for Healthcare Providers: SeriousBroker.it  This test is not yet approved or cleared by the [place]  FDA and has been authorized for detection and/or diagnosis of SARS-CoV-2 by FDA under an Emergency Use Authorization (EUA). This EUA will remain in effect (meaning this test can be used) for the duration of the COVID-19 declaration under Section 564(b)(1) of the Act, 21 U.S.C. section 360bbb-3(b)(1), unless the authorization is terminated or revoked.     Resp Syncytial Virus by PCR NEGATIVE NEGATIVE Final    Comment: (NOTE) Fact Sheet for Patients: BloggerCourse.com  Fact Sheet for Healthcare  Providers: SeriousBroker.it  This test is not yet approved or cleared by the [place]  FDA and has been authorized for detection and/or diagnosis of SARS-CoV-2 by FDA under an Emergency Use Authorization (EUA). This EUA will remain in effect (meaning this test can be used) for the duration of the COVID-19 declaration under Section 564(b)(1) of the Act, 21 U.S.C. section 360bbb-3(b)(1), unless the authorization is terminated or revoked.  Performed at [facility], [address], [place], [place] [address]   MRSA Next Gen by PCR, Nasal     Status: None   Collection Time: 03/12/24  3:53 PM   Specimen: Nasal Swab  Result Value Ref Range Status   MRSA by PCR Next Gen NOT DETECTED NOT DETECTED Final    Comment: (NOTE) The GeneXpert MRSA Assay (FDA approved for NASAL specimens only), is one component of a comprehensive MRSA colonization surveillance program. It is not intended to diagnose MRSA infection nor to guide or monitor treatment for MRSA infections. Test performance is not FDA approved in patients less than 66 years old. Performed at [facility], [address] [place], [place] [address]   Blood Culture (routine x 2)     Status: None (Preliminary result)   Collection Time: 03/12/24  4:50 PM   Specimen: BLOOD  Result Value Ref Range Status   Specimen Description BLOOD LEFT ANTECUBITAL  Final   Special Requests   Final    BOTTLES DRAWN AEROBIC AND ANAEROBIC Blood Culture adequate volume   Culture   Final    NO GROWTH 2 DAYS Performed at [facility], [address] [place], [place] [address]    Report Status PENDING  Incomplete  Blood Culture (routine x 2)     Status: None (Preliminary result)   Collection Time: 03/12/24  5:04 PM   Specimen: BLOOD  Result Value Ref Range Status   Specimen Description BLOOD LEFT ANTECUBITAL  Final   Special  Requests   Final    BOTTLES DRAWN AEROBIC ONLY Blood Culture results may not be  optimal due to an inadequate volume of blood received in culture bottles   Culture   Final    NO GROWTH 2 DAYS Performed at [facility], [address] [place], [place] [address]    Report Status PENDING  Incomplete  Resp panel by RT-PCR (RSV, Flu A&B, Covid) Anterior Nasal Swab     Status: None   Collection Time: 03/13/24  4:24 AM   Specimen: Anterior Nasal Swab  Result Value Ref Range Status   SARS Coronavirus 2 by RT PCR NEGATIVE NEGATIVE Final   Influenza A by PCR NEGATIVE NEGATIVE Final   Influenza B by PCR NEGATIVE NEGATIVE Final    Comment: (NOTE) The Xpert Xpress SARS-CoV-2/FLU/RSV plus assay is intended as an aid in the diagnosis of influenza from Nasopharyngeal swab specimens and should not be used as a sole basis for treatment. Nasal washings and aspirates are unacceptable for Xpert Xpress SARS-CoV-2/FLU/RSV testing.  Fact Sheet for Patients: BloggerCourse.com  Fact Sheet for Healthcare Providers: SeriousBroker.it  This test is not yet approved or cleared by the [place]  FDA and has been authorized for detection and/or diagnosis of SARS-CoV-2 by FDA under an Emergency Use Authorization (EUA). This EUA will remain in effect (meaning this test can be used) for the duration of the COVID-19 declaration under Section 564(b)(1) of the Act, 21 U.S.C. section 360bbb-3(b)(1), unless the authorization is terminated or revoked.     Resp Syncytial Virus by PCR NEGATIVE NEGATIVE Final    Comment: (NOTE) Fact Sheet for Patients: BloggerCourse.com  Fact Sheet for Healthcare Providers: SeriousBroker.it  This test is not yet approved or cleared by the [place]  FDA and has been authorized for detection and/or diagnosis of SARS-CoV-2 by FDA under an Emergency Use Authorization (EUA). This EUA will remain in effect (meaning this test can be used) for the duration of  the COVID-19 declaration under Section 564(b)(1) of the Act, 21 U.S.C. section 360bbb-3(b)(1), unless the authorization is terminated or revoked.  Performed at [facility], [address], [place], [place] [address]      Radiology Studies: VAS US  ABI WITH/WO TBI Result Date: 03/13/2024  LOWER EXTREMITY DOPPLER STUDY Patient Name:  Brae TYESON TANIMOTO.  Date of Exam:   03/13/2024 Medical Rec #: [id number]           Accession #:    [id number] Date of Birth: 02-25-1935          Patient Gender: M Patient Age:   18 years Exam Location:  [facility] Procedure:      VAS US  ABI WITH/WO TBI Referring Phys: EDITHA RATHORE --------------------------------------------------------------------------------  Indications: Claudication. High Risk Factors: Hypertension, Diabetes, current smoker. Other Factors: CKD, AAA,.  Comparison Study: No previous exams Performing Technologist: Leigh Rom RVT/RDMS  Examination Guidelines: A complete evaluation includes at minimum, Doppler waveform signals and systolic blood pressure reading at the level of bilateral brachial, anterior tibial, and posterior tibial arteries, when vessel segments are accessible. Bilateral testing is considered an integral part of a complete examination. Photoelectric Plethysmograph (PPG) waveforms and toe systolic pressure readings are included as required and additional duplex testing as needed. Limited examinations for reoccurring indications may be performed as noted.  ABI Findings: +---------+------------------+-----+-----------+--------+ Right    Rt Pressure (mmHg)IndexWaveform   Comment  +---------+------------------+-----+-----------+--------+ Brachial 115  biphasic            +---------+------------------+-----+-----------+--------+ PTA      116               0.97 multiphasic         +---------+------------------+-----+-----------+--------+ DP       128               1.08 multiphasic          +---------+------------------+-----+-----------+--------+ Great Toe75                0.63 Abnormal            +---------+------------------+-----+-----------+--------+ +---------+------------------+-----+----------+----------------+ Left     Lt Pressure (mmHg)IndexWaveform  Comment          +---------+------------------+-----+----------+----------------+ Brachial 119                    biphasic                   +---------+------------------+-----+----------+----------------+ PTA                             monophasicnon compressible +---------+------------------+-----+----------+----------------+ DP       76                0.64 monophasic                 +---------+------------------+-----+----------+----------------+ Great Toe0                 0.00 Absent                     +---------+------------------+-----+----------+----------------+  Summary: Right: Resting right ankle-brachial index is within normal range. The right toe-brachial index is abnormal.  Left: Resting left ankle-brachial index indicates noncompressible left lower extremity arteries. The left toe-brachial index is absent.  *See table(s) above for measurements and observations.  Electronically signed by Lonni Gaskins MD on 03/13/2024 at 3:13:02 PM.    Final    ECHOCARDIOGRAM COMPLETE Result Date: 03/13/2024    ECHOCARDIOGRAM REPORT   Patient Name:   Caidan Hubbert. Date of Exam: 03/13/2024 Medical Rec #:  [id number]          Height:       66.0 in Accession #:    [id number]         Weight:       110.0 lb Date of Birth:  11/05/1934         BSA:          1.551 m Patient Age:    88 years           BP:           135/87 mmHg Patient Gender: M                  HR:           79 bpm. Exam Location:  Inpatient Procedure: 2D Echo, 3D Echo, Cardiac Doppler, Color Doppler and Strain Analysis            (Both Spectral and Color Flow Doppler were utilized during            procedure). Indications:    I50.33 Acute on  chronic diastolic (congestive) heart failure  History:        Patient has prior history of Echocardiogram examinations, most  recent 03/23/2022. Cardiomyopathy, Abnormal ECG, COPD,                 Arrythmias:PVC, PAC and Bradycardia; Risk Factors:Current                 Smoker, Hypertension, Diabetes and Dyslipidemia. LVH.  Sonographer:    Ellouise Mose RDCS Referring Phys: ALFORNIA MADISON  Sonographer Comments: Patient talking throughout exam. IMPRESSIONS  1. No obstruction. Left ventricular ejection fraction, by estimation, is 25 to 30%. Left ventricular ejection fraction by 3D volume is 28 %. Left ventricular ejection fraction by 2D MOD biplane is 29.9 %. Left ventricular ejection fraction by PLAX is 22  %. The left ventricle has severely decreased function. The left ventricle demonstrates global hypokinesis. There is severe concentric left ventricular hypertrophy. Left ventricular diastolic parameters are consistent with Grade II diastolic dysfunction (pseudonormalization). The average left ventricular global longitudinal strain is -10.0 %. The global longitudinal strain is abnormal.  2. Right ventricular systolic function is mildly reduced. The right ventricular size is normal. Severely increased right ventricular wall thickness. There is normal pulmonary artery systolic pressure. The estimated right ventricular systolic pressure is  17.1 mmHg.  3. A small pericardial effusion is present. The pericardial effusion is posterior to the left ventricle and localized near the right atrium. There is no evidence of cardiac tamponade.  4. The mitral valve is normal in structure. Trivial mitral valve regurgitation. No evidence of mitral stenosis.  5. The aortic valve is tricuspid. There is moderate calcification of the aortic valve. There is moderate thickening of the aortic valve. Aortic valve regurgitation is not visualized. Aortic valve sclerosis is present, with no evidence of aortic valve stenosis.  6.  The inferior vena cava is dilated in size with >50% respiratory variability, suggesting right atrial pressure of 8 mmHg. Conclusion(s)/Recommendation(s): Consider cardiac amyloidosis, HCM. FINDINGS  Left Ventricle: No obstruction. Left ventricular ejection fraction, by estimation, is 25 to 30%. Left ventricular ejection fraction by PLAX is 22 %. Left ventricular ejection fraction by 2D MOD biplane is 29.9 %. Left ventricular ejection fraction by 3D  volume is 28 %. The left ventricle has severely decreased function. The left ventricle demonstrates global hypokinesis. The average left ventricular global longitudinal strain is -10.0 %. Strain was performed and the global longitudinal strain is abnormal. The left ventricular internal cavity size was normal in size. There is severe concentric left ventricular hypertrophy. Left ventricular diastolic parameters are consistent with Grade II diastolic dysfunction (pseudonormalization). Right Ventricle: The right ventricular size is normal. Severely increased right ventricular wall thickness. Right ventricular systolic function is mildly reduced. There is normal pulmonary artery systolic pressure. The tricuspid regurgitant velocity is 1.88 m/s, and with an assumed right atrial pressure of 3 mmHg, the estimated right ventricular systolic pressure is 17.1 mmHg. Left Atrium: Left atrial size was normal in size. Right Atrium: Right atrial size was normal in size. Pericardium: A small pericardial effusion is present. The pericardial effusion is posterior to the left ventricle and localized near the right atrium. There is no evidence of cardiac tamponade. Mitral Valve: The mitral valve is normal in structure. There is moderate thickening of the mitral valve leaflet(s). Trivial mitral valve regurgitation. No evidence of mitral valve stenosis. Tricuspid Valve: The tricuspid valve is normal in structure. Tricuspid valve regurgitation is trivial. No evidence of tricuspid stenosis.  Aortic Valve: The aortic valve is tricuspid. There is moderate calcification of the aortic valve. There is moderate thickening of the aortic valve. Aortic valve  regurgitation is not visualized. Aortic valve sclerosis is present, with no evidence of aortic valve stenosis. Pulmonic Valve: The pulmonic valve was normal in structure. Pulmonic valve regurgitation is not visualized. No evidence of pulmonic stenosis. Aorta: The aortic root and ascending aorta are structurally normal, with no evidence of dilitation. Venous: The inferior vena cava is dilated in size with greater than 50% respiratory variability, suggesting right atrial pressure of 8 mmHg. IAS/Shunts: No atrial level shunt detected by color flow Doppler. Additional Comments: 3D was performed not requiring image post processing on an independent workstation and was abnormal.  LEFT VENTRICLE PLAX 2D                        Biplane EF (MOD) LV EF:         Left            LV Biplane EF:   Left                ventricular                      ventricular                ejection                         ejection                fraction by                      fraction by                PLAX is 22                       2D MOD                %.                               biplane is LVIDd:         3.10 cm                          29.9 %. LVIDs:         2.80 cm LV PW:         1.90 cm LV IVS:        2.10 cm         2D Longitudinal LVOT diam:     2.10 cm         Strain LV SV:         34              2D Strain GLS   -10.0 % LV SV Index:   22              Avg: LVOT Area:     3.46 cm LV IVRT:       46 msec         3D Volume EF                                LV 3D EF:    Left  ventricul LV Volumes (MOD)                            ar LV vol d, MOD    70.4 ml                    ejection A2C:                                        fraction LV vol d, MOD    52.5 ml                    by 3D A4C:                                         volume is LV vol s, MOD    43.7 ml                    28 %. A2C: LV vol s, MOD    39.3 ml A4C:                           3D Volume EF: LV SV MOD A2C:   26.7 ml       3D EF:        28 % LV SV MOD A4C:   52.5 ml       LV EDV:       85 ml LV SV MOD BP:    18.4 ml       LV ESV:       61 ml                                LV SV:        24 ml RIGHT VENTRICLE             IVC RV S prime:     11.50 cm/s  IVC diam: 2.30 cm TAPSE (M-mode): 1.2 cm LEFT ATRIUM             Index        RIGHT ATRIUM           Index LA diam:        3.50 cm 2.26 cm/m   RA Area:     10.80 cm LA Vol (A2C):   32.6 ml 21.02 ml/m  RA Volume:   20.50 ml  13.22 ml/m LA Vol (A4C):   24.6 ml 15.86 ml/m LA Biplane Vol: 29.2 ml 18.83 ml/m  AORTIC VALVE LVOT Vmax:   64.50 cm/s LVOT Vmean:  39.300 cm/s LVOT VTI:    0.099 m  AORTA Ao Root diam: 3.30 cm Ao Asc diam:  3.20 cm MITRAL VALVE               TRICUSPID VALVE MV Area (PHT): 4.06 cm    TR Peak grad:   14.1 mmHg MV Decel Time: 187 msec    TR Vmax:        188.00 cm/s MV E velocity: 90.70 cm/s MV A velocity: 47.70 cm/s  SHUNTS MV E/A ratio:  1.90        Systemic VTI:  0.10 m                            Systemic Diam: 2.10 cm Oneil Parchment MD Electronically signed by Oneil Parchment MD Signature Date/Time: 03/13/2024/10:30:00 AM    Final    CT Angio Chest/Abd/Pel for Dissection W and/or Wo Contrast Result Date: 03/12/2024 EXAM: CTA CHEST, ABDOMEN AND PELVIS WITH AND WITHOUT IV CONTRAST 03/12/2024 07:31:23 PM TECHNIQUE: CTA of the chest was performed with and without the administration of intravenous contrast. CTA of the abdomen and pelvis was performed with and without the administration of intravenous contrast. Multiplanar reformatted images are provided for review. MIP images are provided for review. Automated exposure control, iterative reconstruction, and/or weight based adjustment of the mA/kV was utilized to reduce the radiation dose to as low as reasonably achievable. 75mL of iohexol  (OMNIPAQUE ) 350  MG/ML injection was used. COMPARISON: Comparison to CT examination on 03/07/2024. CLINICAL HISTORY: Aortic atherosclerosis; sob. FINDINGS: VASCULATURE: AORTA: Mild atherosclerotic calcification within the thoracic aorta. Fusiform infrarenal abdominal aortic aneurysm is present measuring 4.2 x 4.9 cm, stable since prior examination, with circumferential mural thrombus within the aneurysm sac. The aneurysm terminates at the aortic bifurcation. Moderate superimposed aortic iliac atherosclerotic calcification. PULMONARY ARTERIES: No intraluminal filling defect identified to suggest acute or chronic pulmonary embolism. Adequate opacification of the pulmonary arterial tree. GREAT VESSELS OF AORTIC ARCH: The arch vasculature demonstrates conventional anatomic configuration. Less than 50% stenosis of the left subclavian artery at its origin. 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 Celiac axis and superior mesenteric artery are diminutive but otherwise widely patent. 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 RENAL ARTERIES: Right renal artery is diminutive in keeping with the atrophic right kidney. Greater than 50% stenosis of the mid left renal artery ( 146/10 ) ILIAC ARTERIES: Moderate superimposed aortic iliac atherosclerotic calcification. CHEST: MEDIASTINUM: Mild coronary artery calcification. Calcification of the aortic valve leaflets. Global cardiac size within normal limits. No pericardial effusion. LUNGS AND PLEURA: Advanced emphysema. Bronchial wall thickening noted in keeping with airway inflammation. Since the prior examination, there is increasing bibasilar bronchial wall thickening in keeping with progressive airway inflammation, as well as extensive airway impaction and peripheral consolidation  which may reflect changes of acute bronchopneumonia or aspiration. No pneumothorax or pleural effusion. THORACIC BONES AND SOFT TISSUES: No acute bone or soft tissue abnormality. Marked body wall wasting. ABDOMEN AND PELVIS: LIVER: The liver is unremarkable. GALLBLADDER AND BILE DUCTS: Gallbladder is unremarkable. No biliary ductal dilatation. SPLEEN: The spleen is unremarkable. PANCREAS: The pancreas is unremarkable. ADRENAL GLANDS: Bilateral adrenal glands demonstrate no acute abnormality. KIDNEYS, URETERS AND BLADDER: Marked asymmetric atrophy of the right kidney. Nonobstructing 13 mm calculus noted within the lower pole of the right kidney. The left kidney is unremarkable. Bladder is unremarkable. GI AND BOWEL: The stomach, small bowel, and large bowel are otherwise unremarkable. There is relatively poor delineation of the visceral structures and bowel within the abdomen due to the early phase of contrast administration and marked wasting with paucity of intra-abdominal fat . REPRODUCTIVE: Reproductive organs are unremarkable. PERITONEUM AND RETROPERITONEUM: No ascites or free air. LYMPH NODES: No lymphadenopathy. ABDOMINAL BONES AND SOFT TISSUES: No acute abnormality of the bones. Marked body wall wasting and loss of retroperitoneal and intraabdominal fat . Osseous structures are age appropriate.  No acute bone abnormality. No lytic or blastic bone lesion. IMPRESSION: 1. No evidence of pulmonary embolism. 2. Progressive airway inflammation and extensive bibasilar airway impaction and peripheral consolidation in keeping with multifocal bronchopneumonia or aspiration. 3. Stable fusiform infrarenal abdominal aortic aneurysm measuring 4.2 x 4.9 cm with circumferential mural thrombus. No aortic dissection. 4. 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 5. Marked asymmetric atrophy of the right kidney 6.  50% stenosis of the mid left renal artery 7. Advanced emphysema. Marked body wall wasting and loss of retroperitoneal and intraabdominal fat. Electronically signed by: Dorethia Molt MD 03/12/2024 07:55 PM EDT RP Workstation: [id number]   DG Chest 2 View Result Date: 03/12/2024 CLINICAL DATA:  Question of sepsis to evaluate for abnormality. EXAM: CHEST - 2 VIEW COMPARISON:  03/07/2024 FINDINGS: Mild cardiac enlargement. No vascular congestion, edema, or consolidation. Emphysematous changes in the lungs. Peribronchial thickening with central interstitial pattern suggesting chronic bronchitis. No pleural effusion or pneumothorax. Mediastinal contours appear intact. Calcification of the aorta. Degenerative changes in the spine. IMPRESSION: Emphysematous and chronic bronchitic changes in the lungs. No focal consolidation. Mild cardiac enlargement Electronically Signed   By: Elsie Gravely M.D.   On: 03/12/2024 18:14    Scheduled Meds:  heparin   5,000 Units Subcutaneous Q8H   insulin  aspart  0-5 Units Subcutaneous QHS   insulin  aspart  0-9 Units Subcutaneous TID WC   Continuous Infusions:  azithromycin  (ZITHROMAX ) 500 mg in sodium chloride  0.9 % 250 mL IVPB 250 mL/hr at 03/13/24 2200   cefTRIAXone  (ROCEPHIN )  IV Stopped (03/13/24 2025)     LOS: 0 days   Fredia Skeeter, MD [facility]  03/14/2024, 11:19 AM   *Please note that this is a verbal dictation therefore any spelling or grammatical errors are due to the Dragon Medical One system interpretation.  Please page via Amion and do not message via secure chat for urgent patient care matters. Secure chat can be used for non urgent patient care matters.  How to contact the [facility] Attending or Consulting provider 7A - 7P or covering provider during after hours 7P -7A, for this patient?  Check the care team in [facility] and look for a) attending/consulting [facility] provider listed and b) the [facility] team listed. Page or secure chat 7A-7P. Log into  [email or web address] and use [facility] universal password to access. If you do not have the password, please contact the hospital operator. Locate the [facility] provider you are looking for under [facility] and page to a number that you can be directly reached. If you still have difficulty reaching the provider, please page the [facility] (Director on Call) for the Hospitalists listed on amion for assistance.

## 2024-03-14 NOTE — Plan of Care (Signed)

## 2024-03-14 NOTE — Evaluation (Signed)
 Occupational Therapy Evaluation Patient Details Name: Vincent Baxter. MRN: [id number] DOB: 11-22-1934 Today's Date: 03/14/2024   History of Present Illness   Vincent Baxter. is a 89 y.o. male admitted 9/25 presenting with a chief complaint of right-sided lateral rib pain which has been ongoing for several months.  Also with bil LE pain and PNA.  PMH: HTN, HFmrEF, COPD, type 2 diabetes, renal mass, nephrolithiasis, AAA, CKD stage IIIb, anemia, GERD, THC and tobacco abuse, BPH     Clinical Impressions PTA, pt lived alone and reports being independent. Upon eval, pt with generalized weakness and deconditioning, cachetic in appearance. Pt needing up to min A for OOB mobility and ADL with repeated cueing for safety with RW use. Will continue to follow acutely. Patient will benefit from continued inpatient follow up therapy, <3 hours/day      If plan is discharge home, recommend the following:   A little help with walking and/or transfers;A little help with bathing/dressing/bathroom;Assistance with cooking/housework;Assist for transportation;Help with stairs or ramp for entrance     Functional Status Assessment   Patient has had a recent decline in their functional status and demonstrates the ability to make significant improvements in function in a reasonable and predictable amount of time.     Equipment Recommendations   Other (comment) (defer)     Recommendations for Other Services         Precautions/Restrictions   Precautions Precautions: Fall Restrictions Weight Bearing Restrictions Per Provider Order: No     Mobility Bed Mobility Overal bed mobility: Independent                  Transfers Overall transfer level: Needs assistance Equipment used: Rolling walker (2 wheels) Transfers: Sit to/from Stand Sit to Stand: Contact guard assist           General transfer comment: impulsive at times needing cues to slow down and CGA      Balance  Overall balance assessment: Needs assistance Sitting-balance support: No upper extremity supported, Feet supported Sitting balance-Leahy Scale: Fair     Standing balance support: Bilateral upper extremity supported, During functional activity, Reliant on assistive device for balance Standing balance-Leahy Scale: Poor                             ADL either performed or assessed with clinical judgement   ADL Overall ADL's : Needs assistance/impaired Eating/Feeding: Set up;Sitting   Grooming: Contact guard assist;Standing   Upper Body Bathing: Set up;Sitting   Lower Body Bathing: Minimal assistance;Sit to/from stand   Upper Body Dressing : Set up;Sitting   Lower Body Dressing: Minimal assistance;Sit to/from stand   Toilet Transfer: Contact guard assist;Minimal assistance;Rolling walker (2 wheels);Ambulation           Functional mobility during ADLs: Contact guard assist;Minimal assistance;Rolling walker (2 wheels)       Vision Patient Visual Report: No change from baseline       Perception         Praxis         Pertinent Vitals/Pain Pain Assessment Pain Assessment: No/denies pain     Extremity/Trunk Assessment Upper Extremity Assessment Upper Extremity Assessment: Generalized weakness   Lower Extremity Assessment Lower Extremity Assessment: Defer to PT evaluation   Cervical / Trunk Assessment Cervical / Trunk Assessment: Kyphotic   Communication Communication Communication: No apparent difficulties   Cognition Arousal: Alert Behavior During Therapy: Flat affect Cognition: No family/caregiver  present to determine baseline             OT - Cognition Comments: follows 1-2 steap commands, easily distracted, difficulty multitasking, and poor carryover with RW safety techniques                 Following commands: Intact       Cueing  General Comments   Cueing Techniques: Verbal cues;Tactile cues      Exercises      Shoulder Instructions      Home Living Family/patient expects to be discharged to:: Private residence Living Arrangements: Alone Available Help at Discharge: Family;Available PRN/intermittently Type of Home: House Home Access: Stairs to enter [employer] of Steps: 1 Entrance Stairs-Rails: None (holds door and pulls up) Home Layout: One level     Bathroom Shower/Tub: Chief Strategy Officer: Standard     Home Equipment: None   Additional Comments: likes to water yard      Prior Functioning/Environment Prior Level of Function : Independent/Modified Independent;Driving               ADLs Comments: Didnt get into tub much per pt but did B/D self    OT Problem List: Decreased strength;Decreased activity tolerance;Impaired balance (sitting and/or standing);Decreased safety awareness;Decreased knowledge of use of DME or AE;Decreased knowledge of precautions   OT Treatment/Interventions: Self-care/ADL training;Therapeutic exercise;DME and/or AE instruction;Therapeutic activities;Patient/family education;Balance training      OT Goals(Current goals can be found in the care plan section)   Acute Rehab OT Goals Patient Stated Goal: get stronger OT Goal Formulation: With patient Time For Goal Achievement: 03/28/24 Potential to Achieve Goals: Good   OT Frequency:  Min 2X/week    Co-evaluation              AM-PAC OT 6 Clicks Daily Activity     Outcome Measure Help from another person eating meals?: A Little Help from another person taking care of personal grooming?: A Little Help from another person toileting, which includes using toliet, bedpan, or urinal?: A Little Help from another person bathing (including washing, rinsing, drying)?: A Little Help from another person to put on and taking off regular upper body clothing?: A Little Help from another person to put on and taking off regular lower body clothing?: A Little 6 Click Score:  18   End of Session Equipment Utilized During Treatment: Gait belt;Rolling walker (2 wheels) Nurse Communication: Mobility status  Activity Tolerance: Patient tolerated treatment well Patient left: in bed;with bed alarm set;with call bell/phone within reach  OT Visit Diagnosis: Unsteadiness on feet (R26.81);Muscle weakness (generalized) (M62.81)                Time: 8973-8961 OT Time Calculation (min): 12 min Charges:  OT General Charges $OT Visit: 1 Visit OT Evaluation $OT Eval Low Complexity: 1 Low  Elma JONETTA Lebron FREDERICK, OTR/L [facility] Acute Rehabilitation Office: [phone]   Elma JONETTA Lebron 03/14/2024, 12:44 PM

## 2024-03-14 NOTE — Evaluation (Signed)
 Physical Therapy Evaluation Patient Details Name: Vincent Baxter. MRN: [id number] DOB: 08-Nov-1934 Today's Date: 03/14/2024  History of Present Illness  Vincent Warner Laduca. is a 89 y.o. male admitted 9/25 presenting with a chief complaint of right-sided lateral rib pain which has been ongoing for several months.  Also with bil LE pain and PNA.  PMH: HTN, HFmrEF, COPD, type 2 diabetes, renal mass, nephrolithiasis, AAA, CKD stage IIIb, anemia, GERD, THC and tobacco abuse, BPH  Clinical Impression  Pt admitted with above diagnosis. Pt was able to ambulate with RW to hallway with min assist as he had multiple LOB with fatigue and with distractions.Also cannot withstand challenges to balance scoring 12/24 on DGI.  Feel that pt will benefit from post acute rehab < 3 hours day and pt in agreement that he desires to get stronger before he goes home.will follow acutely.  Pt currently with functional limitations due to the deficits listed below (see PT Problem List). Pt will benefit from acute skilled PT to increase their independence and safety with mobility to allow discharge.       If plan is discharge home, recommend the following: A little help with walking and/or transfers;A little help with bathing/dressing/bathroom;Assistance with cooking/housework;Assist for transportation;Help with stairs or ramp for entrance   Can travel by private vehicle   Yes    Equipment Recommendations Rollator (4 wheels)  Recommendations for Other Services       Functional Status Assessment Patient has had a recent decline in their functional status and demonstrates the ability to make significant improvements in function in a reasonable and predictable amount of time.     Precautions / Restrictions Precautions Precautions: Fall Restrictions Weight Bearing Restrictions Per Provider Order: No      Mobility  Bed Mobility Overal bed mobility: Independent                  Transfers Overall transfer  level: Needs assistance Equipment used: Rolling walker (2 wheels) Transfers: Sit to/from Stand Sit to Stand: Contact guard assist           General transfer comment: impulsive at times needing cues to slow down and CGA    Ambulation/Gait Ambulation/Gait assistance: Min assist Gait Distance (Feet): 180 Feet Assistive device: Rolling walker (2 wheels), None Gait Pattern/deviations: Step-through pattern, Decreased stride length, Scissoring, Trunk flexed, Wide base of support, Drifts right/left, Staggering left, Staggering right   Gait velocity interpretation: <1.31 ft/sec, indicative of household ambulator   General Gait Details: Pt needs UE support and could not ambulate without device or UE support.  Pt used RW and initially did well with cues to stay close to RW however once pt became distracted, pt with LOB all directions needing min assist to correct balance.  Poor balance reactions overall.  Stairs            Wheelchair Mobility     Tilt Bed    Modified Rankin (Stroke Patients Only)       Balance Overall balance assessment: Needs assistance Sitting-balance support: No upper extremity supported, Feet supported Sitting balance-Leahy Scale: Fair     Standing balance support: Bilateral upper extremity supported, During functional activity, Reliant on assistive device for balance Standing balance-Leahy Scale: Poor                   Standardized Balance Assessment Standardized Balance Assessment : Dynamic Gait Index   Dynamic Gait Index Level Surface: Mild Impairment Change in Gait Speed: Mild Impairment Gait  with Horizontal Head Turns: Moderate Impairment Gait with Vertical Head Turns: Moderate Impairment Gait and Pivot Turn: Mild Impairment Step Over Obstacle: Moderate Impairment Step Around Obstacles: Mild Impairment Steps: Moderate Impairment Total Score: 12       Pertinent Vitals/Pain Pain Assessment Pain Assessment: No/denies pain     Home Living Family/patient expects to be discharged to:: Private residence Living Arrangements: Alone Available Help at Discharge: Family;Available PRN/intermittently Type of Home: House Home Access: Stairs to enter Entrance Stairs-Rails: None (holds door and pulls up) Entrance Stairs-Number of Steps: 1   Home Layout: One level Home Equipment: None Additional Comments: likes to water yard    Prior Function Prior Level of Function : Independent/Modified Independent;Driving               ADLs Comments: Didnt get into tub much per pt but did B/D self     Extremity/Trunk Assessment   Upper Extremity Assessment Upper Extremity Assessment: Defer to OT evaluation    Lower Extremity Assessment Lower Extremity Assessment: Generalized weakness    Cervical / Trunk Assessment Cervical / Trunk Assessment: Kyphotic  Communication   Communication Communication: No apparent difficulties    Cognition Arousal: Alert Behavior During Therapy: Flat affect   PT - Cognitive impairments: Problem solving, Safety/Judgement, Awareness                         Following commands: Intact       Cueing       General Comments      Exercises     Assessment/Plan    PT Assessment Patient needs continued PT services  PT Problem List Decreased activity tolerance;Decreased balance;Decreased mobility;Decreased knowledge of use of DME;Decreased safety awareness;Decreased knowledge of precautions       PT Treatment Interventions DME instruction;Gait training;Functional mobility training;Therapeutic activities;Therapeutic exercise;Balance training;Patient/family education    PT Goals (Current goals can be found in the Care Plan section)  Acute Rehab PT Goals Patient Stated Goal: to go home PT Goal Formulation: With patient Time For Goal Achievement: 03/28/24 Potential to Achieve Goals: Good    Frequency Min 2X/week     Co-evaluation               AM-PAC PT  6 Clicks Mobility  Outcome Measure Help needed turning from your back to your side while in a flat bed without using bedrails?: None Help needed moving from lying on your back to sitting on the side of a flat bed without using bedrails?: A Little Help needed moving to and from a bed to a chair (including a wheelchair)?: A Little Help needed standing up from a chair using your arms (e.g., wheelchair or bedside chair)?: A Lot Help needed to walk in hospital room?: A Lot Help needed climbing 3-5 steps with a railing? : Total 6 Click Score: 15    End of Session Equipment Utilized During Treatment: Gait belt Activity Tolerance: Patient limited by fatigue Patient left:  (left with OT at sink) Nurse Communication: Mobility status PT Visit Diagnosis: Muscle weakness (generalized) (M62.81);Unsteadiness on feet (R26.81)    Time: 8991-8973 PT Time Calculation (min) (ACUTE ONLY): 18 min   Charges:   PT Evaluation $PT Eval Moderate Complexity: 1 Mod   PT General Charges $$ ACUTE PT VISIT: 1 Visit         Meara Wiechman M,PT Acute Rehab Services [phone]   Vincent Baxter 03/14/2024, 10:36 AM

## 2024-03-15 DIAGNOSIS — J189 Pneumonia, unspecified organism: Secondary | ICD-10-CM | POA: Diagnosis not present

## 2024-03-15 LAB — RESPIRATORY PANEL BY PCR

## 2024-03-15 LAB — GLUCOSE, CAPILLARY
Glucose-Capillary: 143 mg/dL — ABNORMAL HIGH (ref 70–99)
Glucose-Capillary: 206 mg/dL — ABNORMAL HIGH (ref 70–99)
Glucose-Capillary: 149 mg/dL — ABNORMAL HIGH (ref 70–99)
Glucose-Capillary: 144 mg/dL — ABNORMAL HIGH (ref 70–99)
Glucose-Capillary: 199 mg/dL — ABNORMAL HIGH (ref 70–99)

## 2024-03-15 NOTE — Progress Notes (Signed)
 Patient's RN came to  this CN and complained that patient has been very disrespectful to her, this CN went to talk to him. Upon entering the and introducing myself, the patient said  go tell that thing referring to the nurse. This CN to him to stop calling her names and that's unacceptable, he most respect people who are helping him. At first he was arguing but he later apologized.

## 2024-03-15 NOTE — Progress Notes (Signed)
 PROGRESS NOTE    Vincent Baxter.  FMW:[id number] DOB: 05-02-1935 DOA: 03/12/2024 PCP: [facility]   Brief Narrative:  HPI: Vincent Baxter. is a 89 y.o. male with medical history significant of hypertension, HFmrEF, COPD, type 2 diabetes, renal mass, nephrolithiasis, AAA, CKD stage IIIb, anemia, GERD, THC and tobacco abuse, BPH presenting with a chief complaint of right-sided lateral rib pain which has been ongoing for several months.  He denies any recent falls or trauma.  He is also reporting shortness of breath and cough for several weeks.  Denies fevers.  Patient states he is not able to taste food and has lost his appetite over the past few months.  Reports losing weight but he is not sure how much.  He is also endorsing bilateral leg pain which is worse with exertion x 3 months.  No other complaints.   ED course: Vital signs stable.  EKG showing sinus rhythm and no acute ischemic changes.  Labs showing no leukocytosis, hemoglobin normal, platelet count 144k (chronically low and no significant change from baseline), potassium 5.4, bicarb 19 (chronically low and stable), anion gap 12, glucose 170, BUN 47, creatinine 1.7 (stable), normal lipase and LFTs, lactic acid normal x 2, blood cultures in process, troponin 108> 105, BNP 409, UA not suggestive of infection.    Assessment & Plan:   Principal Problem:   Multifocal pneumonia Active Problems:   DM2 (diabetes mellitus, type 2) (HCC)   COPD (chronic obstructive pulmonary disease) with emphysema (HCC)   Claudication of both lower extremities   CKD (chronic kidney disease) stage 3, GFR 30-59 ml/min (HCC)  Multifocal pneumonia  CT chest showing progressive airway inflammation and extensive bibasilar airway impaction and peripheral consolidation suspicious for multifocal bronchopneumonia versus aspiration.  No fever, leukocytosis, or lactic acidosis.  No signs of sepsis.  Patient not hypoxic. Test sent for strep  pneumo/Legionella urinary antigens and pending.  Test for COVID/flu/RSV negative.  Patient has no complaints of shortness of breath or cough today and no problem swallowing.  He was never hypoxic.  Lungs clear to auscultation on my examination.  Continue Rocephin  and Zithromax  for total of 5 days.   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  In addition, showing 50% stenosis of the mid left renal artery.  Consulted vascular surgery, ABI obtained which are normal according to vascular surgery.  Per them, he does not have any signs of claudication, PAD, does not need any intervention.  CTA showing 5 cm abdominal aortic aneurysm.  He has no abdominal pain.  Vascular surgery recommends outpatient follow-up.  Per them, in men they repair there is greater than 5.5 cm and his aneurysm does not meet criteria for repair.  He mainly complains of bilateral leg stiffness.  Seen by PT OT, they recommended SNF.  TOC working on placement.   Right-sided lateral rib pain Appears to be chronic/ongoing for several months.  Patient denies any recent falls or trauma.  No evidence of rib fractures on chest imaging.   Elevated troponin Likely due to demand ischemia.  Troponin elevated but stable, pattern not consistent with ACS.  EKG without acute ischemic changes.  Patient is not endorsing any substernal or left-sided chest pain/pressure.   Mild hyperkalemia in the setting of CKD stage IIIb No acute EKG changes.  A  dose of Lokelma  given, potassium normalized.   Unintentional weight loss CT chest/abdomen/pelvis without any findings to suggest malignancy.   Chronic HFmrEF Last echo done in October 2023 showing EF 40 to 45%, grade 2 diastolic dysfunction, severe concentric LVH concerning  for possible HCM versus amyloidosis, moderate biatrial dilation, and trivial mitral regurgitation.  BNP 409 but patient does not appear volume overloaded on exam.  No evidence of pulmonary edema on imaging.  Repeat echocardiogram shows EF of 25 to 30% and grade 2 diastolic dysfunction.  Patient is asymptomatic as far as CHF goes.  [facility]  Recommended close follow-up with cardiology.  DVT prophylaxis: heparin  injection 5,000 Units Start: 03/13/24 0600   Code Status: Full Code  Family Communication:  None present at bedside.  Plan of care discussed with patient in length and he/she verbalized understanding and agreed with it.  Status is: Inpatient Remains inpatient appropriate because: Medically stable, pending placement.     Estimated body mass index is 17.75 kg/m as calculated from the following:   Height as of this encounter: 5' 6 (1.676 m).   Weight as of this encounter: 49.9 kg.  Wound 03/13/24 0500 Pressure Injury Sacrum Stage 2 -  Partial thickness loss of dermis presenting as a shallow open injury with a red, pink wound bed without slough. (Active)   Nutritional Assessment: Body mass index is 17.75 kg/m.SABRA Seen by dietician.  I agree with the assessment and plan as outlined below: Nutrition Status:        . Skin Assessment: I have examined the patient's skin and I agree with the wound assessment as performed by the wound care RN as outlined below: Wound 03/13/24 0500 Pressure Injury Sacrum Stage 2 -  Partial thickness loss of dermis presenting as a shallow open injury with a red, pink wound bed without slough. (Active)    Consultants:  Vascular surgery-signed off  Procedures:  None  Antimicrobials:  Anti-infectives (From admission, onward)    Start     Dose/Rate Route Frequency Ordered Stop   03/14/24 2200  azithromycin  (ZITHROMAX ) tablet 500 mg        500 mg Oral Daily 03/14/24 1415 03/17/24 2159   03/13/24 2200  azithromycin  (ZITHROMAX ) 500 mg in  sodium chloride  0.9 % 250 mL IVPB  Status:  Discontinued        500 mg 250 mL/hr over 60 Minutes Intravenous Every 24 hours 03/13/24 0053 03/14/24 1415   03/13/24 2000  cefTRIAXone  (ROCEPHIN ) 1 g in sodium chloride  0.9 % 100 mL IVPB        1 g 200 mL/hr over 30 Minutes Intravenous Every 24 hours 03/13/24 0053 03/16/24 2359   03/13/24 0000  amoxicillin -clavulanate (AUGMENTIN ) 500-125 MG tablet       Note to Pharmacy: ok change to 500mg  bid per verbal from dwayne [facility]/dr Ioannis Schuh--hja   1 tablet Oral 2 times daily 03/13/24 1328 03/20/24 2359   03/12/24 2045  cefTRIAXone  (ROCEPHIN ) 2 g in sodium chloride  0.9 % 100 mL IVPB        2 g 200 mL/hr over 30 Minutes Intravenous  Once 03/12/24 2033 03/12/24 2142   03/12/24 2045  azithromycin  (ZITHROMAX ) 500 mg in sodium chloride  0.9 % 250 mL IVPB        500 mg 250 mL/hr over 60 Minutes Intravenous  Once 03/12/24 2033 03/12/24 2302         Subjective: Seen and examined.  No new complaint other than right ankle stiffness and right leg  pain.  Objective: Vitals:   03/14/24 1638 03/14/24 2059 03/15/24 0500 03/15/24 0745  BP: 110/77 106/69 123/84 119/86  Pulse: 81 79  81  Resp: 18 18 18 18   Temp: 97.9 F (36.6 C) 98.5 F (36.9 C) 98.5 F (36.9 C) 98.4 F (36.9 C)  TempSrc:  Oral Oral Oral  SpO2: 100% 98% 99% 99%  Weight:      Height:        Intake/Output Summary (Last 24 hours) at 03/15/2024 1149 Last data filed at 03/15/2024 0930 Gross per 24 hour  Intake 840 ml  Output 1350 ml  Net -510 ml   Filed Weights   03/12/24 1619  Weight: 49.9 kg    Examination:  General exam: Appears calm and comfortable  Respiratory system: Clear to auscultation. Respiratory effort normal. Cardiovascular system: S1 & S2 heard, RRR. No JVD, murmurs, rubs, gallops or clicks. No pedal edema. Gastrointestinal system: Abdomen is nondistended, soft and nontender. No organomegaly or masses felt. Normal bowel sounds heard. Central nervous system: Alert and  oriented. No focal neurological deficits. Extremities: Symmetric 5 x 5 power. Skin: No rashes, lesions or ulcers.   Data Reviewed: I have personally reviewed following labs and imaging studies  CBC: Recent Labs  Lab 03/12/24 1710  WBC 7.1  NEUTROABS 5.7  HGB 13.3  HCT 39.0  MCV 97.0  PLT 144*   Basic Metabolic Panel: Recent Labs  Lab 03/12/24 1710 03/13/24 0424  NA 135 134*  K 5.4* 4.9  CL 104 107  CO2 19* 20*  GLUCOSE 170* 183*  BUN 47* 42*  CREATININE 1.71* 1.53*  CALCIUM  9.0 8.2*   GFR: Estimated Creatinine Clearance: 24.5 mL/min (A) (by C-G formula based on SCr of 1.53 mg/dL (H)). Liver Function Tests: Recent Labs  Lab 03/12/24 1710  AST 16  ALT 14  ALKPHOS 88  BILITOT 0.5  PROT 6.7  ALBUMIN 3.5   Recent Labs  Lab 03/12/24 1710  LIPASE 20   No results for input(s): AMMONIA in the last 168 hours. Coagulation Profile: Recent Labs  Lab 03/12/24 1710  INR 1.0   Cardiac Enzymes: No results for input(s): CKTOTAL, CKMB, CKMBINDEX, TROPONINI in the last 168 hours. BNP (last 3 results) No results for input(s): PROBNP in the last 8760 hours. HbA1C: Recent Labs    03/13/24 0424  HGBA1C 6.4*   CBG: Recent Labs  Lab 03/14/24 0815 03/14/24 1135 03/14/24 1637 03/14/24 2101 03/15/24 0740  GLUCAP 117* 138* 152* 131* 143*   Lipid Profile: No results for input(s): CHOL, HDL, LDLCALC, TRIG, CHOLHDL, LDLDIRECT in the last 72 hours. Thyroid  Function Tests: No results for input(s): TSH, T4TOTAL, FREET4, T3FREE, THYROIDAB in the last 72 hours. Anemia Panel: No results for input(s): VITAMINB12, FOLATE, FERRITIN, TIBC, IRON, RETICCTPCT in the last 72 hours. Sepsis Labs: Recent Labs  Lab 03/12/24 1655 03/12/24 2018  LATICACIDVEN 0.9 1.0    Recent Results (from the past 240 hours)  Resp panel by RT-PCR (RSV, Flu A&B, Covid) Anterior Nasal Swab     Status: None   Collection Time: 03/07/24  5:39 PM    Specimen: Anterior Nasal Swab  Result Value Ref Range Status   SARS Coronavirus 2 by RT PCR NEGATIVE NEGATIVE Final   Influenza A by PCR NEGATIVE NEGATIVE Final   Influenza B by PCR NEGATIVE NEGATIVE Final    Comment: (NOTE) The Xpert Xpress SARS-CoV-2/FLU/RSV plus assay is intended as an aid in the diagnosis of influenza from Nasopharyngeal swab specimens and should not be  used as a sole basis for treatment. Nasal washings and aspirates are unacceptable for Xpert Xpress SARS-CoV-2/FLU/RSV testing.  Fact Sheet for Patients: BloggerCourse.com  Fact Sheet for Healthcare Providers: SeriousBroker.it  This test is not yet approved or cleared by the [place]  FDA and has been authorized for detection and/or diagnosis of SARS-CoV-2 by FDA under an Emergency Use Authorization (EUA). This EUA will remain in effect (meaning this test can be used) for the duration of the COVID-19 declaration under Section 564(b)(1) of the Act, 21 U.S.C. section 360bbb-3(b)(1), unless the authorization is terminated or revoked.     Resp Syncytial Virus by PCR NEGATIVE NEGATIVE Final    Comment: (NOTE) Fact Sheet for Patients: BloggerCourse.com  Fact Sheet for Healthcare Providers: SeriousBroker.it  This test is not yet approved or cleared by the [place]  FDA and has been authorized for detection and/or diagnosis of SARS-CoV-2 by FDA under an Emergency Use Authorization (EUA). This EUA will remain in effect (meaning this test can be used) for the duration of the COVID-19 declaration under Section 564(b)(1) of the Act, 21 U.S.C. section 360bbb-3(b)(1), unless the authorization is terminated or revoked.  Performed at [facility], [address], [place], [place] [address]   MRSA Next Gen by PCR, Nasal     Status: None   Collection Time: 03/12/24  3:53 PM   Specimen: Nasal Swab  Result  Value Ref Range Status   MRSA by PCR Next Gen NOT DETECTED NOT DETECTED Final    Comment: (NOTE) The GeneXpert MRSA Assay (FDA approved for NASAL specimens only), is one component of a comprehensive MRSA colonization surveillance program. It is not intended to diagnose MRSA infection nor to guide or monitor treatment for MRSA infections. Test performance is not FDA approved in patients less than 46 years old. Performed at [facility], [address], [place], [place] [address]   Blood Culture (routine x 2)     Status: None (Preliminary result)   Collection Time: 03/12/24  4:50 PM   Specimen: BLOOD  Result Value Ref Range Status   Specimen Description BLOOD LEFT ANTECUBITAL  Final   Special Requests   Final    BOTTLES DRAWN AEROBIC AND ANAEROBIC Blood Culture adequate volume   Culture   Final    NO GROWTH 3 DAYS Performed at [facility], [address] [place], [place] [address]    Report Status PENDING  Incomplete  Blood Culture (routine x 2)     Status: None (Preliminary result)   Collection Time: 03/12/24  5:04 PM   Specimen: BLOOD  Result Value Ref Range Status   Specimen Description BLOOD LEFT ANTECUBITAL  Final   Special Requests   Final    BOTTLES DRAWN AEROBIC ONLY Blood Culture results may not be optimal due to an inadequate volume of blood received in culture bottles   Culture   Final    NO GROWTH 3 DAYS Performed at [facility], [address] [place], [place] [address]    Report Status PENDING  Incomplete  Resp panel by RT-PCR (RSV, Flu A&B, Covid) Anterior Nasal Swab     Status: None   Collection Time: 03/13/24  4:24 AM   Specimen: Anterior Nasal Swab  Result Value Ref Range Status   SARS Coronavirus 2 by RT PCR NEGATIVE NEGATIVE Final   Influenza A by PCR NEGATIVE NEGATIVE Final   Influenza B by PCR NEGATIVE NEGATIVE Final    Comment: (NOTE) The Xpert  Xpress SARS-CoV-2/FLU/RSV plus assay is intended as an aid in the diagnosis of  influenza from Nasopharyngeal swab specimens and should not be used as a sole basis for treatment. Nasal washings and aspirates are unacceptable for Xpert Xpress SARS-CoV-2/FLU/RSV testing.  Fact Sheet for Patients: BloggerCourse.com  Fact Sheet for Healthcare Providers: SeriousBroker.it  This test is not yet approved or cleared by the [place]  FDA and has been authorized for detection and/or diagnosis of SARS-CoV-2 by FDA under an Emergency Use Authorization (EUA). This EUA will remain in effect (meaning this test can be used) for the duration of the COVID-19 declaration under Section 564(b)(1) of the Act, 21 U.S.C. section 360bbb-3(b)(1), unless the authorization is terminated or revoked.     Resp Syncytial Virus by PCR NEGATIVE NEGATIVE Final    Comment: (NOTE) Fact Sheet for Patients: BloggerCourse.com  Fact Sheet for Healthcare Providers: SeriousBroker.it  This test is not yet approved or cleared by the [place]  FDA and has been authorized for detection and/or diagnosis of SARS-CoV-2 by FDA under an Emergency Use Authorization (EUA). This EUA will remain in effect (meaning this test can be used) for the duration of the COVID-19 declaration under Section 564(b)(1) of the Act, 21 U.S.C. section 360bbb-3(b)(1), unless the authorization is terminated or revoked.  Performed at [facility], [address] [place], [place] [address]      Radiology Studies: VAS US  ABI WITH/WO TBI Result Date: 03/13/2024  LOWER EXTREMITY DOPPLER STUDY Patient Name:  Vincent Baxter.  Date of Exam:   03/13/2024 Medical Rec #: [id number]           Accession #:    [id number] Date of Birth: August 26, 1934          Patient Gender: M Patient Age:   81 years Exam Location:  [facility] Procedure:      VAS US  ABI WITH/WO TBI Referring Phys: EDITHA RATHORE  --------------------------------------------------------------------------------  Indications: Claudication. High Risk Factors: Hypertension, Diabetes, current smoker. Other Factors: CKD, AAA,.  Comparison Study: No previous exams Performing Technologist: Leigh Rom RVT/RDMS  Examination Guidelines: A complete evaluation includes at minimum, Doppler waveform signals and systolic blood pressure reading at the level of bilateral brachial, anterior tibial, and posterior tibial arteries, when vessel segments are accessible. Bilateral testing is considered an integral part of a complete examination. Photoelectric Plethysmograph (PPG) waveforms and toe systolic pressure readings are included as required and additional duplex testing as needed. Limited examinations for reoccurring indications may be performed as noted.  ABI Findings: +---------+------------------+-----+-----------+--------+ Right    Rt Pressure (mmHg)IndexWaveform   Comment  +---------+------------------+-----+-----------+--------+ Brachial 115                    biphasic            +---------+------------------+-----+-----------+--------+ PTA      116               0.97 multiphasic         +---------+------------------+-----+-----------+--------+ DP       128               1.08 multiphasic         +---------+------------------+-----+-----------+--------+ Great Toe75                0.63 Abnormal            +---------+------------------+-----+-----------+--------+ +---------+------------------+-----+----------+----------------+ Left     Lt Pressure (mmHg)IndexWaveform  Comment          +---------+------------------+-----+----------+----------------+ Brachial 119  biphasic                   +---------+------------------+-----+----------+----------------+ PTA                             monophasicnon compressible +---------+------------------+-----+----------+----------------+ DP       76                 0.64 monophasic                 +---------+------------------+-----+----------+----------------+ Great Toe0                 0.00 Absent                     +---------+------------------+-----+----------+----------------+  Summary: Right: Resting right ankle-brachial index is within normal range. The right toe-brachial index is abnormal.  Left: Resting left ankle-brachial index indicates noncompressible left lower extremity arteries. The left toe-brachial index is absent.  *See table(s) above for measurements and observations.  Electronically signed by Lonni Gaskins MD on 03/13/2024 at 3:13:02 PM.    Final     Scheduled Meds:  azithromycin   500 mg Oral Daily   feeding supplement  237 mL Oral BID BM   heparin   5,000 Units Subcutaneous Q8H   insulin  aspart  0-5 Units Subcutaneous QHS   insulin  aspart  0-9 Units Subcutaneous TID WC   Continuous Infusions:  cefTRIAXone  (ROCEPHIN )  IV 1 g (03/14/24 2006)     LOS: 1 day   Fredia Skeeter, MD [facility]  03/15/2024, 11:49 AM   *Please note that this is a verbal dictation therefore any spelling or grammatical errors are due to the Dragon Medical One system interpretation.  Please page via Amion and do not message via secure chat for urgent patient care matters. Secure chat can be used for non urgent patient care matters.  How to contact the [facility] Attending or Consulting provider 7A - 7P or covering provider during after hours 7P -7A, for this patient?  Check the care team in [facility] and look for a) attending/consulting [facility] provider listed and b) the [facility] team listed. Page or secure chat 7A-7P. Log into [email or web address] and use [facility] universal password to access. If you do not have the password, please contact the hospital operator. Locate the [facility] provider you are looking for under [facility] and page to a number that you can be directly reached. If you still have difficulty reaching the provider, please page  the [facility] (Director on Call) for the Hospitalists listed on amion for assistance.

## 2024-03-15 NOTE — Progress Notes (Signed)
 Patient disrespectful towards the primary nurse since the beginning of the shift. Situation was escalated to the charge nurse.

## 2024-03-15 NOTE — Plan of Care (Signed)

## 2024-03-16 ENCOUNTER — Other Ambulatory Visit (HOSPITAL_COMMUNITY): Payer: Self-pay

## 2024-03-16 ENCOUNTER — Inpatient Hospital Stay (HOSPITAL_COMMUNITY)

## 2024-03-16 DIAGNOSIS — J189 Pneumonia, unspecified organism: Secondary | ICD-10-CM | POA: Diagnosis not present

## 2024-03-16 LAB — GLUCOSE, CAPILLARY
Glucose-Capillary: 226 mg/dL — ABNORMAL HIGH (ref 70–99)
Glucose-Capillary: 139 mg/dL — ABNORMAL HIGH (ref 70–99)
Glucose-Capillary: 149 mg/dL — ABNORMAL HIGH (ref 70–99)
Glucose-Capillary: 169 mg/dL — ABNORMAL HIGH (ref 70–99)

## 2024-03-16 MED ORDER — SENNA 8.6 MG PO TABS
1.0000 | ORAL_TABLET | Freq: Once | ORAL | Status: AC
  Administered 2024-03-16: 8.6 mg via ORAL
  Filled 2024-03-16: qty 1

## 2024-03-16 NOTE — Plan of Care (Signed)

## 2024-03-16 NOTE — Evaluation (Signed)
 Modified Barium Swallow Study  Patient Details  Name: Vincent Baxter. MRN: [id number] Date of Birth: October 05, 1934  Today's Date: 03/16/2024  Modified Barium Swallow completed.  Full report located under Chart Review in the Imaging Section.  History of Present Illness Pt is an 89 yo male presenting to ED 9/25 with R sided lateral rib pain. CTA Chest shows progressive airway inflammation and extensive bibasilar airway impaction and peripheral consolidation in keeping with multifocal bronchopneumonia or aspiration. PMH includes HTN, COPD, T2DM, renal mass, nephrolithiasis, AAA, CKD 3B, anemia, GERD, THC and tobacco use, BPH   Clinical Impression Pt has a mild oropharyngeal dysphagia (DIGEST score of 1 also suggestive of mild dysphagia). Cognition seems to impact lingual control and transport, showing disorganization, premature spillage, and oral residue only intermittently. His pharyngeal phase is more functional throughout most of the study but impaired timing, along with reduced hyoid excursion and laryngeal vestibule closure, resulted in aspiration of thin liquids only via straw (PAS 8, silent aspiration). Penetration via cup was only observed when consuming the barium tablet with most of the barium being ejected, although note that the barium tablet also required multiple additional pureed and liquid boluses to clear his esophagus. Recommend continuing regular solids and thin liquids via cup (no straw). Would offer meds in puree, crushing if larger in size.  Factors that may increase risk of adverse event in presence of aspiration Noe & Lianne 2021):    Swallow Evaluation Recommendations Recommendations: PO diet PO Diet Recommendation: Regular;Thin liquids (Level 0) Liquid Administration via: Cup;No straw Medication Administration: Whole meds with puree (crush if larger) Supervision: Patient able to self-feed;Intermittent supervision/cueing for swallowing strategies Swallowing  strategies  : Minimize environmental distractions;Slow rate;Small bites/sips;Follow solids with liquids Postural changes: Position pt fully upright for meals;Stay upright 30-60 min after meals Oral care recommendations: Oral care BID (2x/day) Recommended consults: Consider esophageal assessment      Leita SAILOR., M.A. CCC-SLP Acute Rehabilitation Services Office: [phone]  Secure chat preferred  03/16/2024,10:19 AM

## 2024-03-16 NOTE — TOC Progression Note (Addendum)
 Transition of Care (TOC) - Progression Note    Patient Details  Name: Vincent Baxter. MRN: [id number] Date of Birth: 12/06/1934  Transition of Care [facility]) CM/SW Contact  Sherline Clack, [place] Phone Number: 03/16/2024, 11:28 AM  Clinical Narrative:     Update 1:54 PM: insurance authorization pending, shara ID: [id number].  CSW presented list of bed offers and Medicare ratings to patient at bedside. Patient identified [facility] as his choice facility for discharge. CSW will accept facility in hub and submit insurance authorization.   Expected Discharge Plan: Skilled Nursing Facility Barriers to Discharge: Continued Medical Work up               Expected Discharge Plan and Services       Living arrangements for the past 2 months: Skilled Nursing Facility Expected Discharge Date: 03/13/24                                     Social Drivers of Health (SDOH) Interventions SDOH Screenings   Food Insecurity: No Food Insecurity (03/14/2024)  Housing: Low Risk  (03/14/2024)  Transportation Needs: No Transportation Needs (03/14/2024)  Utilities: Not At Risk (03/14/2024)  Social Connections: Unknown (03/14/2024)  Recent Concern: Social Connections - Socially Isolated (03/13/2024)  Tobacco Use: High Risk (03/12/2024)    Readmission Risk Interventions     No data to display

## 2024-03-16 NOTE — Progress Notes (Signed)
 PROGRESS NOTE    Vincent Baxter.  FMW:[id number] DOB: [birth year] DOA: 03/12/2024 PCP: [facility]   Brief Narrative:  HPI: Vincent Baxter. is a 89 y.o. male with medical history significant of hypertension, HFmrEF, COPD, type 2 diabetes, renal mass, nephrolithiasis, AAA, CKD stage IIIb, anemia, GERD, THC and tobacco abuse, BPH presenting with a chief complaint of right-sided lateral rib pain which has been ongoing for several months.  He denies any recent falls or trauma.  He is also reporting shortness of breath and cough for several weeks.  Denies fevers.  Patient states he is not able to taste food and has lost his appetite over the past few months.  Reports losing weight but he is not sure how much.  He is also endorsing bilateral leg pain which is worse with exertion x 3 months.  No other complaints.   ED course: Vital signs stable.  EKG showing sinus rhythm and no acute ischemic changes.  Labs showing no leukocytosis, hemoglobin normal, platelet count 144k (chronically low and no significant change from baseline), potassium 5.4, bicarb 19 (chronically low and stable), anion gap 12, glucose 170, BUN 47, creatinine 1.7 (stable), normal lipase and LFTs, lactic acid normal x 2, blood cultures in process, troponin 108> 105, BNP 409, UA not suggestive of infection.    Assessment & Plan:   Principal Problem:   Multifocal pneumonia Active Problems:   DM2 (diabetes mellitus, type 2) (HCC)   COPD (chronic obstructive pulmonary disease) with emphysema (HCC)   Claudication of both lower extremities   CKD (chronic kidney disease) stage 3, GFR 30-59 ml/min (HCC)  Multifocal pneumonia  CT chest showing progressive airway inflammation and extensive bibasilar airway impaction and peripheral consolidation suspicious for multifocal bronchopneumonia versus aspiration.  No fever, leukocytosis, or lactic acidosis.  No signs of sepsis.  Patient not hypoxic. Test sent for strep  pneumo/Legionella urinary antigens and pending.  Test for COVID/flu/RSV negative.  Patient has no complaints of shortness of breath or cough today and no problem swallowing.  He was never hypoxic.  Lungs clear to auscultation on my examination.  Continue Rocephin  and Zithromax  for total of 5 days.   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  In addition, showing 50% stenosis of the mid left renal artery.  Consulted vascular surgery, ABI obtained which are normal according to vascular surgery.  Per them, he does not have any signs of claudication, PAD, does not need any intervention.  CTA showing 5 cm abdominal aortic aneurysm.  He has no abdominal pain.  Vascular surgery recommends outpatient follow-up.  Per them, in men they repair there is greater than 5.5 cm and his aneurysm does not meet criteria for repair.  He mainly complains of bilateral leg stiffness.  Seen by PT OT, they recommended SNF.  TOC working on placement.   Right-sided lateral rib pain Appears to be chronic/ongoing for several months.  Patient denies any recent falls or trauma.  No evidence of rib fractures on chest imaging.   Elevated troponin Likely due to demand ischemia.  Troponin elevated but stable, pattern not consistent with ACS.  EKG without acute ischemic changes.  Patient is not endorsing any substernal or left-sided chest pain/pressure.   Mild hyperkalemia in the setting of CKD stage IIIb No acute EKG changes.  A  dose of Lokelma  given, potassium normalized.   Unintentional weight loss CT chest/abdomen/pelvis without any findings to suggest malignancy.   Chronic HFmrEF Last echo done in October 2023 showing EF 40 to 45%, grade 2 diastolic dysfunction, severe concentric LVH concerning  for possible HCM versus amyloidosis, moderate biatrial dilation, and trivial mitral regurgitation.  BNP 409 but patient does not appear volume overloaded on exam.  No evidence of pulmonary edema on imaging.  Repeat echocardiogram shows EF of 25 to 30% and grade 2 diastolic dysfunction.  Patient is asymptomatic as far as CHF goes.  [facility]  Recommended close follow-up with cardiology.  DVT prophylaxis: heparin  injection 5,000 Units Start: 03/13/24 0600   Code Status: Full Code  Family Communication:  None present at bedside.  Plan of care discussed with patient in length and he/she verbalized understanding and agreed with it.  Status is: Inpatient Remains inpatient appropriate because: Medically stable, pending placement.     Estimated body mass index is 17.75 kg/m as calculated from the following:   Height as of this encounter: 5' 6 (1.676 m).   Weight as of this encounter: 49.9 kg.  Wound 03/13/24 0500 Pressure Injury Sacrum Stage 2 -  Partial thickness loss of dermis presenting as a shallow open injury with a red, pink wound bed without slough. (Active)   Nutritional Assessment: Body mass index is 17.75 kg/m.SABRA Seen by dietician.  I agree with the assessment and plan as outlined below: Nutrition Status:        . Skin Assessment: I have examined the patient's skin and I agree with the wound assessment as performed by the wound care RN as outlined below: Wound 03/13/24 0500 Pressure Injury Sacrum Stage 2 -  Partial thickness loss of dermis presenting as a shallow open injury with a red, pink wound bed without slough. (Active)    Consultants:  Vascular surgery-signed off  Procedures:  None  Antimicrobials:  Anti-infectives (From admission, onward)    Start     Dose/Rate Route Frequency Ordered Stop   03/14/24 2200  azithromycin  (ZITHROMAX ) tablet 500 mg        500 mg Oral Daily 03/14/24 1415 03/17/24 2159   03/13/24 2200  azithromycin  (ZITHROMAX ) 500 mg in  sodium chloride  0.9 % 250 mL IVPB  Status:  Discontinued        500 mg 250 mL/hr over 60 Minutes Intravenous Every 24 hours 03/13/24 0053 03/14/24 1415   03/13/24 2000  cefTRIAXone  (ROCEPHIN ) 1 g in sodium chloride  0.9 % 100 mL IVPB        1 g 200 mL/hr over 30 Minutes Intravenous Every 24 hours 03/13/24 0053 03/16/24 2359   03/13/24 0000  amoxicillin -clavulanate (AUGMENTIN ) 500-125 MG tablet       Note to Pharmacy: ok change to 500mg  bid per verbal from dwayne rph/dr Breindel Collier--hja   1 tablet Oral 2 times daily 03/13/24 1328 03/20/24 2359   03/12/24 2045  cefTRIAXone  (ROCEPHIN ) 2 g in sodium chloride  0.9 % 100 mL IVPB        2 g 200 mL/hr over 30 Minutes Intravenous  Once 03/12/24 2033 03/12/24 2142   03/12/24 2045  azithromycin  (ZITHROMAX ) 500 mg in sodium chloride  0.9 % 250 mL IVPB        500 mg 250 mL/hr over 60 Minutes Intravenous  Once 03/12/24 2033 03/12/24 2302         Subjective: Patient seen and examined.  Continues to complain of right ankle pain and stiffness and  continues to express his frustration that he is not getting better despite of me explaining to him on daily basis that he does not have any PAD per vascular surgery and he might improve with physical therapy for which we are trying to send him to the SNF, it appears that patient has some cognitive disorder and continues to forget over conversation.  No new complaint.  Objective: Vitals:   03/15/24 1706 03/15/24 2007 03/16/24 0514 03/16/24 0750  BP: 114/82 118/80 104/74 106/73  Pulse: 86 92 76 78  Resp: 20 18 18 18   Temp: 98.7 F (37.1 C) 98.2 F (36.8 C) 98.2 F (36.8 C) 97.8 F (36.6 C)  TempSrc:  Oral Oral   SpO2: 100% 95% 100% 100%  Weight:      Height:        Intake/Output Summary (Last 24 hours) at 03/16/2024 1326 Last data filed at 03/16/2024 0918 Gross per 24 hour  Intake --  Output 450 ml  Net -450 ml   Filed Weights   03/12/24 1619  Weight: 49.9 kg    Examination:  General exam: Appears  calm and comfortable  Respiratory system: Clear to auscultation. Respiratory effort normal. Cardiovascular system: S1 & S2 heard, RRR. No JVD, murmurs, rubs, gallops or clicks. No pedal edema. Gastrointestinal system: Abdomen is nondistended, soft and nontender. No organomegaly or masses felt. Normal bowel sounds heard. Central nervous system: Alert and oriented. No focal neurological deficits. Extremities: Symmetric 5 x 5 power. Skin: No rashes, lesions or ulcers.   Data Reviewed: I have personally reviewed following labs and imaging studies  CBC: Recent Labs  Lab 03/12/24 1710  WBC 7.1  NEUTROABS 5.7  HGB 13.3  HCT 39.0  MCV 97.0  PLT 144*   Basic Metabolic Panel: Recent Labs  Lab 03/12/24 1710 03/13/24 0424  NA 135 134*  K 5.4* 4.9  CL 104 107  CO2 19* 20*  GLUCOSE 170* 183*  BUN 47* 42*  CREATININE 1.71* 1.53*  CALCIUM  9.0 8.2*   GFR: Estimated Creatinine Clearance: 24.5 mL/min (A) (by C-G formula based on SCr of 1.53 mg/dL (H)). Liver Function Tests: Recent Labs  Lab 03/12/24 1710  AST 16  ALT 14  ALKPHOS 88  BILITOT 0.5  PROT 6.7  ALBUMIN 3.5   Recent Labs  Lab 03/12/24 1710  LIPASE 20   No results for input(s): AMMONIA in the last 168 hours. Coagulation Profile: Recent Labs  Lab 03/12/24 1710  INR 1.0   Cardiac Enzymes: No results for input(s): CKTOTAL, CKMB, CKMBINDEX, TROPONINI in the last 168 hours. BNP (last 3 results) No results for input(s): PROBNP in the last 8760 hours. HbA1C: No results for input(s): HGBA1C in the last 72 hours.  CBG: Recent Labs  Lab 03/15/24 1443 03/15/24 1705 03/15/24 2224 03/16/24 0905 03/16/24 1222  GLUCAP 149* 144* 199* 226* 139*   Lipid Profile: No results for input(s): CHOL, HDL, LDLCALC, TRIG, CHOLHDL, LDLDIRECT in the last 72 hours. Thyroid  Function Tests: No results for input(s): TSH, T4TOTAL, FREET4, T3FREE, THYROIDAB in the last 72 hours. Anemia Panel: No  results for input(s): VITAMINB12, FOLATE, FERRITIN, TIBC, IRON, RETICCTPCT in the last 72 hours. Sepsis Labs: Recent Labs  Lab 03/12/24 1655 03/12/24 2018  LATICACIDVEN 0.9 1.0    Recent Results (from the past 240 hours)  Resp panel by RT-PCR (RSV, Flu A&B, Covid) Anterior Nasal Swab     Status: None   Collection Time: 03/07/24  5:39 PM   Specimen: Anterior Nasal Swab  Result Value Ref Range Status   SARS Coronavirus 2 by RT PCR NEGATIVE NEGATIVE Final   Influenza A by PCR NEGATIVE NEGATIVE Final   Influenza B by PCR NEGATIVE NEGATIVE Final    Comment: (NOTE) The Xpert Xpress SARS-CoV-2/FLU/RSV plus assay is intended as an aid in the diagnosis of influenza from Nasopharyngeal swab specimens and should not be used as a sole basis for treatment. Nasal washings and aspirates are unacceptable for Xpert Xpress SARS-CoV-2/FLU/RSV testing.  Fact Sheet for Patients: BloggerCourse.com  Fact Sheet for Healthcare Providers: SeriousBroker.it  This test is not yet approved or cleared by the [place]  FDA and has been authorized for detection and/or diagnosis of SARS-CoV-2 by FDA under an Emergency Use Authorization (EUA). This EUA will remain in effect (meaning this test can be used) for the duration of the COVID-19 declaration under Section 564(b)(1) of the Act, 21 U.S.C. section 360bbb-3(b)(1), unless the authorization is terminated or revoked.     Resp Syncytial Virus by PCR NEGATIVE NEGATIVE Final    Comment: (NOTE) Fact Sheet for Patients: BloggerCourse.com  Fact Sheet for Healthcare Providers: SeriousBroker.it  This test is not yet approved or cleared by the [place]  FDA and has been authorized for detection and/or diagnosis of SARS-CoV-2 by FDA under an Emergency Use Authorization (EUA). This EUA will remain in effect (meaning this test can be used) for  the duration of the COVID-19 declaration under Section 564(b)(1) of the Act, 21 U.S.C. section 360bbb-3(b)(1), unless the authorization is terminated or revoked.  Performed at [facility], [address] [place], [place] [address]   MRSA Next Gen by PCR, Nasal     Status: None   Collection Time: 03/12/24  3:53 PM   Specimen: Nasal Swab  Result Value Ref Range Status   MRSA by PCR Next Gen NOT DETECTED NOT DETECTED Final    Comment: (NOTE) The GeneXpert MRSA Assay (FDA approved for NASAL specimens only), is one component of a comprehensive MRSA colonization surveillance program. It is not intended to diagnose MRSA infection nor to guide or monitor treatment for MRSA infections. Test performance is not FDA approved in patients less than 38 years old. Performed at [facility], [address] [place], [place] [address]   Blood Culture (routine x 2)     Status: None (Preliminary result)   Collection Time: 03/12/24  4:50 PM   Specimen: BLOOD  Result Value Ref Range Status   Specimen Description BLOOD LEFT ANTECUBITAL  Final   Special Requests   Final    BOTTLES DRAWN AEROBIC AND ANAEROBIC Blood Culture adequate volume   Culture   Final    NO GROWTH 4 DAYS Performed at [facility], [address] [place], [place] [address]    Report Status PENDING  Incomplete  Blood Culture (routine x 2)     Status: None (Preliminary result)   Collection Time: 03/12/24  5:04 PM   Specimen: BLOOD  Result Value Ref Range Status   Specimen Description BLOOD LEFT ANTECUBITAL  Final   Special Requests   Final    BOTTLES DRAWN AEROBIC ONLY Blood Culture results may not be optimal due to an inadequate volume of blood received in culture bottles   Culture   Final    NO GROWTH 4 DAYS Performed at [facility], [address], [place], [place] [address]    Report Status PENDING  Incomplete  Resp panel by RT-PCR (RSV, Flu A&B, Covid) Anterior Nasal  Swab     Status: None    Collection Time: 03/13/24  4:24 AM   Specimen: Anterior Nasal Swab  Result Value Ref Range Status   SARS Coronavirus 2 by RT PCR NEGATIVE NEGATIVE Final   Influenza A by PCR NEGATIVE NEGATIVE Final   Influenza B by PCR NEGATIVE NEGATIVE Final    Comment: (NOTE) The Xpert Xpress SARS-CoV-2/FLU/RSV plus assay is intended as an aid in the diagnosis of influenza from Nasopharyngeal swab specimens and should not be used as a sole basis for treatment. Nasal washings and aspirates are unacceptable for Xpert Xpress SARS-CoV-2/FLU/RSV testing.  Fact Sheet for Patients: BloggerCourse.com  Fact Sheet for Healthcare Providers: SeriousBroker.it  This test is not yet approved or cleared by the [place]  FDA and has been authorized for detection and/or diagnosis of SARS-CoV-2 by FDA under an Emergency Use Authorization (EUA). This EUA will remain in effect (meaning this test can be used) for the duration of the COVID-19 declaration under Section 564(b)(1) of the Act, 21 U.S.C. section 360bbb-3(b)(1), unless the authorization is terminated or revoked.     Resp Syncytial Virus by PCR NEGATIVE NEGATIVE Final    Comment: (NOTE) Fact Sheet for Patients: BloggerCourse.com  Fact Sheet for Healthcare Providers: SeriousBroker.it  This test is not yet approved or cleared by the [place]  FDA and has been authorized for detection and/or diagnosis of SARS-CoV-2 by FDA under an Emergency Use Authorization (EUA). This EUA will remain in effect (meaning this test can be used) for the duration of the COVID-19 declaration under Section 564(b)(1) of the Act, 21 U.S.C. section 360bbb-3(b)(1), unless the authorization is terminated or revoked.  Performed at [facility], [address], [place], [place] [address]   Respiratory (~20 pathogens) panel by PCR     Status: Abnormal   Collection  Time: 03/13/24 12:05 PM   Specimen: Nasopharyngeal Swab; Respiratory  Result Value Ref Range Status   Adenovirus NOT DETECTED NOT DETECTED Final   Coronavirus 229E NOT DETECTED NOT DETECTED Final    Comment: (NOTE) The Coronavirus on the Respiratory Panel, DOES NOT test for the novel  Coronavirus (2019 nCoV)    Coronavirus HKU1 NOT DETECTED NOT DETECTED Final   Coronavirus NL63 NOT DETECTED NOT DETECTED Final   Coronavirus OC43 NOT DETECTED NOT DETECTED Final   Metapneumovirus NOT DETECTED NOT DETECTED Final   Rhinovirus / Enterovirus DETECTED (A) NOT DETECTED Final   Influenza A NOT DETECTED NOT DETECTED Final   Influenza B NOT DETECTED NOT DETECTED Final   Parainfluenza Virus 1 NOT DETECTED NOT DETECTED Final   Parainfluenza Virus 2 NOT DETECTED NOT DETECTED Final   Parainfluenza Virus 3 NOT DETECTED NOT DETECTED Final   Parainfluenza Virus 4 NOT DETECTED NOT DETECTED Final   Respiratory Syncytial Virus NOT DETECTED NOT DETECTED Final   Bordetella pertussis NOT DETECTED NOT DETECTED Final   Bordetella Parapertussis NOT DETECTED NOT DETECTED Final   Chlamydophila pneumoniae NOT DETECTED NOT DETECTED Final   Mycoplasma pneumoniae NOT DETECTED NOT DETECTED Final    Comment: Performed at [facility], [address], [place], [place] [address]     Radiology Studies: DG Swallowing Func-Speech Pathology Result Date: 03/16/2024 Table formatting from the original result was not included. Modified Barium Swallow Study Patient Details Name: Marcanthony Sleight. MRN: [id number] Date of Birth: 07/16/1934 Today's Date: 03/16/2024 HPI/PMH: HPI: Pt is an 89 yo male presenting to ED 9/25 with R sided lateral rib pain. CTA Chest shows progressive airway  inflammation and extensive bibasilar airway impaction and peripheral consolidation in keeping with multifocal bronchopneumonia or aspiration. PMH includes HTN, COPD, T2DM, renal mass, nephrolithiasis, AAA, CKD 3B, anemia, GERD, THC and tobacco use,  BPH Clinical Impression: Clinical Impression: Pt has a mild oropharyngeal dysphagia (DIGEST score of 1 also suggestive of mild dysphagia). Cognition seems to impact lingual control and transport, showing disorganization, premature spillage, and oral residue only intermittently. His pharyngeal phase is more functional throughout most of the study but impaired timing, along with reduced hyoid excursion and laryngeal vestibule closure, resulted in aspiration of thin liquids only via straw (PAS 8, silent aspiration). Penetration via cup was only observed when consuming the barium tablet with most of the barium being ejected, although note that the barium tablet also required multiple additional pureed and liquid boluses to clear his esophagus. Recommend continuing regular solids and thin liquids via cup (no straw). Would offer meds in puree, crushing if larger in size. Factors that may increase risk of adverse event in presence of aspiration Noe & Lianne 2021): No data recorded Recommendations/Plan: Swallowing Evaluation Recommendations Swallowing Evaluation Recommendations Recommendations: PO diet PO Diet Recommendation: Regular; Thin liquids (Level 0) Liquid Administration via: Cup; No straw Medication Administration: Whole meds with puree (crush if larger) Supervision: Patient able to self-feed; Intermittent supervision/cueing for swallowing strategies Swallowing strategies  : Minimize environmental distractions; Slow rate; Small bites/sips; Follow solids with liquids Postural changes: Position pt fully upright for meals; Stay upright 30-60 min after meals Oral care recommendations: Oral care BID (2x/day) Recommended consults: Consider esophageal assessment Treatment Plan Treatment Plan Treatment recommendations: Therapy as outlined in treatment plan below Follow-up recommendations: Skilled nursing-short term rehab (<3 hours/day) Functional status assessment: Patient has had a recent decline in their functional  status and demonstrates the ability to make significant improvements in function in a reasonable and predictable amount of time. Treatment frequency: Min 2x/week Treatment duration: 2 weeks Interventions: Aspiration precaution training; Patient/family education; Compensatory techniques; Diet toleration management by SLP Recommendations Recommendations for follow up therapy are one component of a multi-disciplinary discharge planning process, led by the attending physician.  Recommendations may be updated based on patient status, additional functional criteria and insurance authorization. Assessment: Orofacial Exam: Orofacial Exam Oral Cavity - Dentition: Missing dentition Anatomy: Anatomy: Suspected cervical osteophytes Boluses Administered: Boluses Administered Boluses Administered: Thin liquids (Level 0); Mildly thick liquids (Level 2, nectar thick); Puree; Solid  Oral Impairment Domain: Oral Impairment Domain Lip Closure: No labial escape Tongue control during bolus hold: Posterior escape of greater than half of bolus (due to cognition) Bolus preparation/mastication: Timely and efficient chewing and mashing Bolus transport/lingual motion: Repetitive/disorganized tongue motion Oral residue: Residue collection on oral structures Location of oral residue : Tongue Initiation of pharyngeal swallow : Pyriform sinuses  Pharyngeal Impairment Domain: Pharyngeal Impairment Domain Soft palate elevation: No bolus between soft palate (SP)/pharyngeal wall (PW) Laryngeal elevation: Complete superior movement of thyroid  cartilage with complete approximation of arytenoids to epiglottic petiole Anterior hyoid excursion: Partial anterior movement Epiglottic movement: Complete inversion Laryngeal vestibule closure: Incomplete, narrow column air/contrast in laryngeal vestibule Pharyngeal stripping wave : Present - complete Pharyngeal contraction (A/P view only): N/A Pharyngoesophageal segment opening: Partial distention/partial  duration, partial obstruction of flow Tongue base retraction: No contrast between tongue base and posterior pharyngeal wall (PPW) Pharyngeal residue: Trace residue within or on pharyngeal structures Location of pharyngeal residue: Valleculae  Esophageal Impairment Domain: Esophageal Impairment Domain Esophageal clearance upright position: Esophageal retention with retrograde flow below pharyngoesophageal segment (PES) Pill: Pill  Consistency administered: Thin liquids (Level 0) Thin liquids (Level 0): Impaired (see clinical impressions) Penetration/Aspiration Scale Score: Penetration/Aspiration Scale Score 1.  Material does not enter airway: Mildly thick liquids (Level 2, nectar thick); Puree; Solid; Pill 8.  Material enters airway, passes BELOW cords without attempt by patient to eject out (silent aspiration) : Thin liquids (Level 0) Compensatory Strategies: Compensatory Strategies Compensatory strategies: Yes Straw: Ineffective Ineffective Straw: Thin liquid (Level 0)   General Information: Caregiver present: No  Diet Prior to this Study: Regular; Thin liquids (Level 0)   Temperature : Normal   Respiratory Status: WFL   Supplemental O2: None (Room air)   History of Recent Intubation: No  Behavior/Cognition: Alert; Cooperative; Pleasant mood Self-Feeding Abilities: Able to self-feed Baseline vocal quality/speech: Normal No data recorded Volitional Swallow: Able to elicit Exam Limitations: No limitations Goal Planning: Prognosis for improved oropharyngeal function: Good No data recorded No data recorded Patient/Family Stated Goal: none stated Consulted and agree with results and recommendations: Patient; Nurse Pain: Pain Assessment Pain Assessment: No/denies pain End of Session: Start Time:SLP Start Time (ACUTE ONLY): 0825 Stop Time: SLP Stop Time (ACUTE ONLY): 0839 Time Calculation:SLP Time Calculation (min) (ACUTE ONLY): 14 min Charges: SLP Evaluations $ SLP Speech Visit: 1 Visit SLP Evaluations $MBS Swallow: 1  Procedure SLP visit diagnosis: SLP Visit Diagnosis: Dysphagia, oropharyngeal phase (R13.12) Past Medical History: Past Medical History: Diagnosis Date  Diabetes mellitus 01/2009 dx  DYSLIPIDEMIA   GERD   Hypertension   Kidney stones   RENAL CALCULUS 01/04/2009 Past Surgical History: Past Surgical History: Procedure Laterality Date  NO PAST SURGERIES   Leita SAILOR., M.A. CCC-SLP Acute Rehabilitation Services Office: [phone] Secure chat preferred 03/16/2024, 10:19 AM   Scheduled Meds:  azithromycin   500 mg Oral Daily   feeding supplement  237 mL Oral BID BM   heparin   5,000 Units Subcutaneous Q8H   insulin  aspart  0-5 Units Subcutaneous QHS   insulin  aspart  0-9 Units Subcutaneous TID WC   Continuous Infusions:  cefTRIAXone  (ROCEPHIN )  IV 1 g (03/15/24 2043)     LOS: 2 days   Fredia Skeeter, MD [facility]  03/16/2024, 1:26 PM   *Please note that this is a verbal dictation therefore any spelling or grammatical errors are due to the Dragon Medical One system interpretation.  Please page via Amion and do not message via secure chat for urgent patient care matters. Secure chat can be used for non urgent patient care matters.  How to contact the [facility] Attending or Consulting provider 7A - 7P or covering provider during after hours 7P -7A, for this patient?  Check the care team in [facility] and look for a) attending/consulting [facility] provider listed and b) the [facility] team listed. Page or secure chat 7A-7P. Log into [email or web address] and use [place]'s universal password to access. If you do not have the password, please contact the hospital operator. Locate the [facility] provider you are looking for under [facility] and page to a number that you can be directly reached. If you still have difficulty reaching the provider, please page the [facility] (Director on Call) for the Hospitalists listed on amion for assistance.

## 2024-03-16 NOTE — Progress Notes (Signed)
 Mobility Specialist: Progress Note   03/16/24 1100  Mobility  Activity Ambulated with assistance  Level of Assistance Contact guard assist, steadying assist  Assistive Device Front wheel walker  Distance Ambulated (ft) 250 ft  Activity Response Tolerated well  Mobility Referral Yes  Mobility visit 1 Mobility  Mobility Specialist Start Time (ACUTE ONLY) 0920  Mobility Specialist Stop Time (ACUTE ONLY) 0946  Mobility Specialist Time Calculation (min) (ACUTE ONLY) 26 min    Pt received in bed. Pt very argumentative throughout the entire session, but reluctantly agreeable to mobility session with encouragement. MinG throughout for safety, pt impulsive and would let go of the RW during ambulation to fix his pants. C/o ankle stiffness and BLE pain from his ankles to his scrotum. Not receptive to mobility suggestions from MS. Initially agreeable to try therabands to work ankle mobility, but we MS returned with the therabands pt was adamant about using his own methods/exercises and that he really just needs to get to the rehab facility. Left in bed with all needs met, call bell in reach. NT present.   Ileana Lute Mobility Specialist Please contact via SecureChat or Rehab office at [phone]

## 2024-03-17 ENCOUNTER — Other Ambulatory Visit (HOSPITAL_COMMUNITY): Payer: Self-pay

## 2024-03-17 DIAGNOSIS — J189 Pneumonia, unspecified organism: Secondary | ICD-10-CM | POA: Diagnosis not present

## 2024-03-17 LAB — GLUCOSE, CAPILLARY
Glucose-Capillary: 163 mg/dL — ABNORMAL HIGH (ref 70–99)
Glucose-Capillary: 90 mg/dL (ref 70–99)
Glucose-Capillary: 133 mg/dL — ABNORMAL HIGH (ref 70–99)
Glucose-Capillary: 123 mg/dL — ABNORMAL HIGH (ref 70–99)

## 2024-03-17 LAB — CULTURE, BLOOD (ROUTINE X 2)
Culture: NO GROWTH
Culture: NO GROWTH
Special Requests: ADEQUATE

## 2024-03-17 MED ORDER — TRAMADOL HCL 50 MG PO TABS
25.0000 mg | ORAL_TABLET | Freq: Once | ORAL | Status: AC
  Administered 2024-03-17: 25 mg via ORAL
  Filled 2024-03-17: qty 1

## 2024-03-17 MED ORDER — GUAIFENESIN-DM 100-10 MG/5ML PO SYRP
5.0000 mL | ORAL_SOLUTION | ORAL | Status: DC | PRN
  Administered 2024-03-17 (×2): 5 mL via ORAL
  Filled 2024-03-17 (×2): qty 10

## 2024-03-17 NOTE — Progress Notes (Signed)
 Speech Language Pathology Treatment: Dysphagia  Patient Details Name: Vincent Baxter. MRN: [id number] DOB: [birth year] Today's Date: 03/17/2024 Time: 9083-9064 SLP Time Calculation (min) (ACUTE ONLY): 19 min  Assessment / Plan / Recommendation Clinical Impression  Session focused mostly on education with results from MBS on previous date. Pt can describe the test and remembers participating in it, but does not remember results, including silent aspiration when drinking via straw. Attempted to provide education about aspiration, the silent nature of his aspiration, and the connection with PNA. He is adamant that he does not have any trouble swallowing. After education, pt was willing to try thin liquids (declined all solids) but grabbed the only cup on his tray that still had a straw in it. SLP removed the straw prior to pt drinking, so liquids were only consumed via cup. A baseline, productive cough was noted this session as well as one delayed cough after drinking water, but given that he was using precautions recommended from MBS (and that his aspiration had been silent), suspect that this is more likely his baseline cough vs a sign of aspiration. Will continue to follow though.    HPI HPI: Pt is an 89 yo male presenting to ED 9/25 with R sided lateral rib pain. CTA Chest shows progressive airway inflammation and extensive bibasilar airway impaction and peripheral consolidation in keeping with multifocal bronchopneumonia or aspiration. PMH includes HTN, COPD, T2DM, renal mass, nephrolithiasis, AAA, CKD 3B, anemia, GERD, THC and tobacco use, BPH      SLP Plan  Continue with current plan of care          Recommendations  Diet recommendations: Regular;Thin liquid Liquids provided via: Cup;No straw Medication Administration: Whole meds with puree Supervision: Patient able to self feed;Intermittent supervision to cue for compensatory strategies Compensations: Minimize environmental  distractions;Slow rate;Small sips/bites Postural Changes and/or Swallow Maneuvers: Seated upright 90 degrees;Upright 30-60 min after meal                  Oral care BID     Dysphagia, oropharyngeal phase (R13.12)     Continue with current plan of care     Vincent Baxter., M.A. CCC-SLP Acute Rehabilitation Services Office: [phone]  Secure chat preferred   03/17/2024, 9:52 AM

## 2024-03-17 NOTE — Progress Notes (Signed)
 pt c/o SOB, check pt O2 which was 100%, BP 112/78, P 83. Pt told me that he want the heart monitor put back on, I advised pt I will have to reach out to the doctor. I then went and call the charge nurse Dauda and help me with pt, he told me to put oxygen on pt at although his O2 stat was 100%, pt denied oxygen stated he don't want that air blowing up his nose, told pt I can bring one that covered his nose and mouth, pt stated he don't want that. Pt is very rude and believe we are not doing anything to help, advise pt I am doing everything he asked me to but most stuff have to come with a doctors order.

## 2024-03-17 NOTE — TOC Progression Note (Signed)
 Transition of Care (TOC) - Progression Note    Patient Details  Name: Vincent Baxter. MRN: [id number] Date of Birth: [birth year]  Transition of Care [facility]) CM/SW Contact  Sherline Clack, [place] Phone Number: 03/17/2024, 3:57 PM  Clinical Narrative:     [facility] representative called CSW and provided information for peer to peer due tomorrow (10/1) by 11 am. The number to call is [phone], option 5. Hospitalist made aware. CSW will continue to follow.   Expected Discharge Plan: Skilled Nursing Facility Barriers to Discharge: Continued Medical Work up               Expected Discharge Plan and Services       Living arrangements for the past 2 months: Skilled Nursing Facility Expected Discharge Date: 03/13/24                                     Social Drivers of Health (SDOH) Interventions SDOH Screenings   Food Insecurity: No Food Insecurity (03/14/2024)  Housing: Low Risk  (03/14/2024)  Transportation Needs: No Transportation Needs (03/14/2024)  Utilities: Not At Risk (03/14/2024)  Social Connections: Unknown (03/14/2024)  Recent Concern: Social Connections - Socially Isolated (03/13/2024)  Tobacco Use: High Risk (03/12/2024)    Readmission Risk Interventions     No data to display

## 2024-03-17 NOTE — Plan of Care (Signed)

## 2024-03-17 NOTE — Progress Notes (Signed)
 Physical Therapy Treatment Patient Details Name: Vincent Baxter. MRN: [id number] DOB: 1934-08-03 Today's Date: 03/17/2024   History of Present Illness 89 yo M adm 03/12/24 Rt rib pain and multifocal bronchopneumonia or aspiration and leg pain. PMH: HTN, COPD, T2DM, renal mass, nephrolithiasis, AAA, CKD, anemia, GERD, THC and tobacco use, BPH    PT Comments  Pt pleasant, confused and with limited attention and memory. Pt reports decreased pain in legs but states ankles are sore. Pt unable to follow commands consistently, declines attempting repeated standing trials/HEP/balance activities. Pt with increased gait distance with continued reliance on RW and physical assist at pt with posterior LOB when distracted or without UB support and despite LOB unable to recognize deficits and fall risk. Will continue to follow. Patient will benefit from continued inpatient follow up therapy, <3 hours/day     If plan is discharge home, recommend the following: A little help with walking and/or transfers;A little help with bathing/dressing/bathroom;Assistance with cooking/housework;Assist for transportation;Help with stairs or ramp for entrance;Supervision due to cognitive status   Can travel by private vehicle     Yes  Equipment Recommendations  Rollator (4 wheels)    Recommendations for Other Services       Precautions / Restrictions Precautions Precautions: Fall     Mobility  Bed Mobility Overal bed mobility: Independent                  Transfers Overall transfer level: Needs assistance   Transfers: Sit to/from Stand Sit to Stand: Contact guard assist           General transfer comment: impulsive at times, requires UB support and reaching out for environment, posterior LOB in standing    Ambulation/Gait Ambulation/Gait assistance: Contact guard assist Gait Distance (Feet): 250 Feet Assistive device: Rolling walker (2 wheels), None Gait Pattern/deviations: Step-through  pattern, Decreased stride length, Trunk flexed   Gait velocity interpretation: 1.31 - 2.62 ft/sec, indicative of limited community ambulator   General Gait Details: pt with kyphotic posture, self too posterior to RW, needing cues for direction and safety. Pt able to extend trunk and step into RW but will not maintain despite cues. Distracted throughout gait and Engineer, manufacturing systems career, posterior LOB with attempting to step without support   Comptroller Bed    Modified Rankin (Stroke Patients Only)       Balance Overall balance assessment: Needs assistance Sitting-balance support: No upper extremity supported, Feet supported Sitting balance-Leahy Scale: Fair     Standing balance support: Bilateral upper extremity supported, During functional activity, Reliant on assistive device for balance Standing balance-Leahy Scale: Poor Standing balance comment: RW in standing                            Communication Communication Communication: No apparent difficulties  Cognition Arousal: Alert Behavior During Therapy: Flat affect   PT - Cognitive impairments: Problem solving, Safety/Judgement, Awareness, Orientation, Memory                       PT - Cognition Comments: pt not oriented to time despite therapist changing calendar on arrival, pt with decreased awareness, memory and safety. When asked who the president is stated Vincent Baxter Following commands: Impaired Following commands impaired: Follows one step commands inconsistently    Cueing Cueing Techniques: Verbal cues  Exercises  General Comments        Pertinent Vitals/Pain Pain Assessment Pain Assessment: No/denies pain    Home Living                          Prior Function            PT Goals (current goals can now be found in the care plan section) Progress towards PT goals: Progressing toward goals    Frequency    Min  2X/week      PT Plan      Co-evaluation              AM-PAC PT 6 Clicks Mobility   Outcome Measure  Help needed turning from your back to your side while in a flat bed without using bedrails?: None Help needed moving from lying on your back to sitting on the side of a flat bed without using bedrails?: None Help needed moving to and from a bed to a chair (including a wheelchair)?: A Little Help needed standing up from a chair using your arms (e.g., wheelchair or bedside chair)?: A Little Help needed to walk in hospital room?: A Little Help needed climbing 3-5 steps with a railing? : A Lot 6 Click Score: 19    End of Session   Activity Tolerance: Patient tolerated treatment well Patient left: in bed;with call bell/phone within reach;with bed alarm set Nurse Communication: Mobility status PT Visit Diagnosis: Muscle weakness (generalized) (M62.81);Unsteadiness on feet (R26.81);Other abnormalities of gait and mobility (R26.89)     Time: 8672-8656 PT Time Calculation (min) (ACUTE ONLY): 16 min  Charges:    $Gait Training: 8-22 mins PT General Charges $$ ACUTE PT VISIT: 1 Visit                     Lenoard SQUIBB, PT Acute Rehabilitation Services Office: [phone]    Lenoard NOVAK Danylle Ouk 03/17/2024, 1:53 PM

## 2024-03-17 NOTE — Progress Notes (Signed)
 PROGRESS NOTE    Vincent Baxter.  FMW:[id number] DOB: 07-10-1934 DOA: 03/12/2024 PCP: [facility]   Brief Narrative:  HPI: Vincent Baxter. is a 89 y.o. male with medical history significant of hypertension, HFmrEF, COPD, type 2 diabetes, renal mass, nephrolithiasis, AAA, CKD stage IIIb, anemia, GERD, THC and tobacco abuse, BPH presenting with a chief complaint of right-sided lateral rib pain which has been ongoing for several months.  He denies any recent falls or trauma.  He is also reporting shortness of breath and cough for several weeks.  Denies fevers.  Patient states he is not able to taste food and has lost his appetite over the past few months.  Reports losing weight but he is not sure how much.  He is also endorsing bilateral leg pain which is worse with exertion x 3 months.  No other complaints.   ED course: Vital signs stable.  EKG showing sinus rhythm and no acute ischemic changes.  Labs showing no leukocytosis, hemoglobin normal, platelet count 144k (chronically low and no significant change from baseline), potassium 5.4, bicarb 19 (chronically low and stable), anion gap 12, glucose 170, BUN 47, creatinine 1.7 (stable), normal lipase and LFTs, lactic acid normal x 2, blood cultures in process, troponin 108> 105, BNP 409, UA not suggestive of infection.    Assessment & Plan:   Principal Problem:   Multifocal pneumonia Active Problems:   DM2 (diabetes mellitus, type 2) (HCC)   COPD (chronic obstructive pulmonary disease) with emphysema (HCC)   Claudication of both lower extremities   CKD (chronic kidney disease) stage 3, GFR 30-59 ml/min (HCC)  Multifocal pneumonia  CT chest showing progressive airway inflammation and extensive bibasilar airway impaction and peripheral consolidation suspicious for multifocal bronchopneumonia versus aspiration.  No fever, leukocytosis, or lactic acidosis.  No signs of sepsis.  Patient not hypoxic. Test sent for strep  pneumo/Legionella urinary antigens and pending.  Test for COVID/flu/RSV negative.  Patient has no complaints of shortness of breath or cough today and no problem swallowing.  He was never hypoxic.  Lungs clear to auscultation on my examination.  Continue Rocephin  and Zithromax  for total of 5 days.   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  In addition, showing 50% stenosis of the mid left renal artery.  Consulted vascular surgery, ABI obtained which are normal according to vascular surgery.  Per them, he does not have any signs of claudication, PAD, does not need any intervention.  CTA showing 5 cm abdominal aortic aneurysm.  He has no abdominal pain.  Vascular surgery recommends outpatient follow-up.  Per them, in men they repair there is greater than 5.5 cm and his aneurysm does not meet criteria for repair.  He mainly complains of bilateral leg stiffness.  Seen by PT OT, they recommended SNF.  TOC working on placement.   Right-sided lateral rib pain Appears to be chronic/ongoing for several months.  Patient denies any recent falls or trauma.  No evidence of rib fractures on chest imaging.   Elevated troponin Likely due to demand ischemia.  Troponin elevated but stable, pattern not consistent with ACS.  EKG without acute ischemic changes.  Patient is not endorsing any substernal or left-sided chest pain/pressure.   Mild hyperkalemia in the setting of CKD stage IIIb No acute EKG changes.  A  dose of Lokelma  given, potassium normalized.   Unintentional weight loss CT chest/abdomen/pelvis without any findings to suggest malignancy.   Chronic HFmrEF Last echo done in October 2023 showing EF 40 to 45%, grade 2 diastolic dysfunction, severe concentric LVH concerning  for possible HCM versus amyloidosis, moderate biatrial dilation, and trivial mitral regurgitation.  BNP 409 but patient does not appear volume overloaded on exam.  No evidence of pulmonary edema on imaging.  Repeat echocardiogram shows EF of 25 to 30% and grade 2 diastolic dysfunction.  Patient is asymptomatic as far as CHF goes.  [facility]  Recommended close follow-up with cardiology.  DVT prophylaxis: heparin  injection 5,000 Units Start: 03/13/24 0600   Code Status: Full Code  Family Communication:  None present at bedside.  Plan of care discussed with patient in length and he/she verbalized understanding and agreed with it.  Status is: Inpatient Remains inpatient appropriate because: Medically stable, pending placement.     Estimated body mass index is 17.75 kg/m as calculated from the following:   Height as of this encounter: 5' 6 (1.676 m).   Weight as of this encounter: 49.9 kg.  Wound 03/13/24 0500 Pressure Injury Sacrum Stage 2 -  Partial thickness loss of dermis presenting as a shallow open injury with a red, pink wound bed without slough. (Active)   Nutritional Assessment: Body mass index is 17.75 kg/m.SABRA Seen by dietician.  I agree with the assessment and plan as outlined below: Nutrition Status:        . Skin Assessment: I have examined the patient's skin and I agree with the wound assessment as performed by the wound care RN as outlined below: Wound 03/13/24 0500 Pressure Injury Sacrum Stage 2 -  Partial thickness loss of dermis presenting as a shallow open injury with a red, pink wound bed without slough. (Active)    Consultants:  Vascular surgery-signed off  Procedures:  None  Antimicrobials:  Anti-infectives (From admission, onward)    Start     Dose/Rate Route Frequency Ordered Stop   03/14/24 2200  azithromycin  (ZITHROMAX ) tablet 500 mg        500 mg Oral Daily 03/14/24 1415 03/16/24 2146   03/13/24 2200  azithromycin  (ZITHROMAX ) 500 mg in  sodium chloride  0.9 % 250 mL IVPB  Status:  Discontinued        500 mg 250 mL/hr over 60 Minutes Intravenous Every 24 hours 03/13/24 0053 03/14/24 1415   03/13/24 2000  cefTRIAXone  (ROCEPHIN ) 1 g in sodium chloride  0.9 % 100 mL IVPB        1 g 200 mL/hr over 30 Minutes Intravenous Every 24 hours 03/13/24 0053 03/16/24 2056   03/13/24 0000  amoxicillin -clavulanate (AUGMENTIN ) 500-125 MG tablet  Status:  Discontinued       Note to Pharmacy: ok change to 500mg  bid per verbal from dwayne rph/dr Amayrani Bennick--hja   1 tablet Oral 2 times daily 03/13/24 1328 03/16/24    03/12/24 2045  cefTRIAXone  (ROCEPHIN ) 2 g in sodium chloride  0.9 % 100 mL IVPB        2 g 200 mL/hr over 30 Minutes Intravenous  Once 03/12/24 2033 03/12/24 2142   03/12/24 2045  azithromycin  (ZITHROMAX ) 500 mg in sodium chloride  0.9 % 250 mL IVPB        500 mg 250 mL/hr over 60 Minutes Intravenous  Once 03/12/24 2033 03/12/24 2302         Subjective: Patient seen and examined.  No new complaint other than right  ankle pain and stiffness.  Objective: Vitals:   03/16/24 2010 03/17/24 0040 03/17/24 0555 03/17/24 0754  BP: 96/72 112/78 115/85 95/68  Pulse: 80 83 77 71  Resp: 18 17 18    Temp: 98.3 F (36.8 C) 97.6 F (36.4 C) (!) 97.5 F (36.4 C) 97.6 F (36.4 C)  TempSrc: Oral Oral Oral Oral  SpO2: 100% 99% 100% 100%  Weight:      Height:        Intake/Output Summary (Last 24 hours) at 03/17/2024 1322 Last data filed at 03/17/2024 0930 Gross per 24 hour  Intake --  Output 375 ml  Net -375 ml   Filed Weights   03/12/24 1619  Weight: 49.9 kg    Examination:  General exam: Appears calm and comfortable  Respiratory system: Clear to auscultation. Respiratory effort normal. Cardiovascular system: S1 & S2 heard, RRR. No JVD, murmurs, rubs, gallops or clicks. No pedal edema. Gastrointestinal system: Abdomen is nondistended, soft and nontender. No organomegaly or masses felt. Normal bowel sounds heard. Central nervous  system: Alert and oriented. No focal neurological deficits. Extremities: Symmetric 5 x 5 power. Skin: No rashes, lesions or ulcers.   Data Reviewed: I have personally reviewed following labs and imaging studies  CBC: Recent Labs  Lab 03/12/24 1710  WBC 7.1  NEUTROABS 5.7  HGB 13.3  HCT 39.0  MCV 97.0  PLT 144*   Basic Metabolic Panel: Recent Labs  Lab 03/12/24 1710 03/13/24 0424  NA 135 134*  K 5.4* 4.9  CL 104 107  CO2 19* 20*  GLUCOSE 170* 183*  BUN 47* 42*  CREATININE 1.71* 1.53*  CALCIUM  9.0 8.2*   GFR: Estimated Creatinine Clearance: 24.5 mL/min (A) (by C-G formula based on SCr of 1.53 mg/dL (H)). Liver Function Tests: Recent Labs  Lab 03/12/24 1710  AST 16  ALT 14  ALKPHOS 88  BILITOT 0.5  PROT 6.7  ALBUMIN 3.5   Recent Labs  Lab 03/12/24 1710  LIPASE 20   No results for input(s): AMMONIA in the last 168 hours. Coagulation Profile: Recent Labs  Lab 03/12/24 1710  INR 1.0   Cardiac Enzymes: No results for input(s): CKTOTAL, CKMB, CKMBINDEX, TROPONINI in the last 168 hours. BNP (last 3 results) No results for input(s): PROBNP in the last 8760 hours. HbA1C: No results for input(s): HGBA1C in the last 72 hours.  CBG: Recent Labs  Lab 03/16/24 1222 03/16/24 1656 03/16/24 2144 03/17/24 0755 03/17/24 1200  GLUCAP 139* 149* 169* 163* 90   Lipid Profile: No results for input(s): CHOL, HDL, LDLCALC, TRIG, CHOLHDL, LDLDIRECT in the last 72 hours. Thyroid  Function Tests: No results for input(s): TSH, T4TOTAL, FREET4, T3FREE, THYROIDAB in the last 72 hours. Anemia Panel: No results for input(s): VITAMINB12, FOLATE, FERRITIN, TIBC, IRON, RETICCTPCT in the last 72 hours. Sepsis Labs: Recent Labs  Lab 03/12/24 1655 03/12/24 2018  LATICACIDVEN 0.9 1.0    Recent Results (from the past 240 hours)  Resp panel by RT-PCR (RSV, Flu A&B, Covid) Anterior Nasal Swab     Status: None   Collection Time:  03/07/24  5:39 PM   Specimen: Anterior Nasal Swab  Result Value Ref Range Status   SARS Coronavirus 2 by RT PCR NEGATIVE NEGATIVE Final   Influenza A by PCR NEGATIVE NEGATIVE Final   Influenza B by PCR NEGATIVE NEGATIVE Final    Comment: (NOTE) The Xpert Xpress SARS-CoV-2/FLU/RSV plus assay is intended as an aid in the diagnosis of influenza from Nasopharyngeal swab specimens and should  not be used as a sole basis for treatment. Nasal washings and aspirates are unacceptable for Xpert Xpress SARS-CoV-2/FLU/RSV testing.  Fact Sheet for Patients: BloggerCourse.com  Fact Sheet for Healthcare Providers: SeriousBroker.it  This test is not yet approved or cleared by the [place]  FDA and has been authorized for detection and/or diagnosis of SARS-CoV-2 by FDA under an Emergency Use Authorization (EUA). This EUA will remain in effect (meaning this test can be used) for the duration of the COVID-19 declaration under Section 564(b)(1) of the Act, 21 U.S.C. section 360bbb-3(b)(1), unless the authorization is terminated or revoked.     Resp Syncytial Virus by PCR NEGATIVE NEGATIVE Final    Comment: (NOTE) Fact Sheet for Patients: BloggerCourse.com  Fact Sheet for Healthcare Providers: SeriousBroker.it  This test is not yet approved or cleared by the [place]  FDA and has been authorized for detection and/or diagnosis of SARS-CoV-2 by FDA under an Emergency Use Authorization (EUA). This EUA will remain in effect (meaning this test can be used) for the duration of the COVID-19 declaration under Section 564(b)(1) of the Act, 21 U.S.C. section 360bbb-3(b)(1), unless the authorization is terminated or revoked.  Performed at [facility], [address], [place], [place] [address]   MRSA Next Gen by PCR, Nasal     Status: None   Collection Time: 03/12/24  3:53 PM   Specimen:  Nasal Swab  Result Value Ref Range Status   MRSA by PCR Next Gen NOT DETECTED NOT DETECTED Final    Comment: (NOTE) The GeneXpert MRSA Assay (FDA approved for NASAL specimens only), is one component of a comprehensive MRSA colonization surveillance program. It is not intended to diagnose MRSA infection nor to guide or monitor treatment for MRSA infections. Test performance is not FDA approved in patients less than 39 years old. Performed at [facility], [address] [place], [place] [address]   Blood Culture (routine x 2)     Status: None   Collection Time: 03/12/24  4:50 PM   Specimen: BLOOD  Result Value Ref Range Status   Specimen Description BLOOD LEFT ANTECUBITAL  Final   Special Requests   Final    BOTTLES DRAWN AEROBIC AND ANAEROBIC Blood Culture adequate volume   Culture   Final    NO GROWTH 5 DAYS Performed at [facility], [address] [place], [place] [address]    Report Status 03/17/2024 FINAL  Final  Blood Culture (routine x 2)     Status: None   Collection Time: 03/12/24  5:04 PM   Specimen: BLOOD  Result Value Ref Range Status   Specimen Description BLOOD LEFT ANTECUBITAL  Final   Special Requests   Final    BOTTLES DRAWN AEROBIC ONLY Blood Culture results may not be optimal due to an inadequate volume of blood received in culture bottles   Culture   Final    NO GROWTH 5 DAYS Performed at [facility], [address] [place], [place] [address]    Report Status 03/17/2024 FINAL  Final  Resp panel by RT-PCR (RSV, Flu A&B, Covid) Anterior Nasal Swab     Status: None   Collection Time: 03/13/24  4:24 AM   Specimen: Anterior Nasal Swab  Result Value Ref Range Status   SARS Coronavirus 2 by RT PCR NEGATIVE NEGATIVE Final   Influenza A by PCR NEGATIVE NEGATIVE Final   Influenza B by PCR NEGATIVE NEGATIVE Final    Comment: (NOTE) The Xpert  Xpress SARS-CoV-2/FLU/RSV plus assay is intended as an aid in the diagnosis of influenza from  Nasopharyngeal swab specimens and should not be used as a sole basis for treatment. Nasal washings and aspirates are unacceptable for Xpert Xpress SARS-CoV-2/FLU/RSV testing.  Fact Sheet for Patients: BloggerCourse.com  Fact Sheet for Healthcare Providers: SeriousBroker.it  This test is not yet approved or cleared by the [place]  FDA and has been authorized for detection and/or diagnosis of SARS-CoV-2 by FDA under an Emergency Use Authorization (EUA). This EUA will remain in effect (meaning this test can be used) for the duration of the COVID-19 declaration under Section 564(b)(1) of the Act, 21 U.S.C. section 360bbb-3(b)(1), unless the authorization is terminated or revoked.     Resp Syncytial Virus by PCR NEGATIVE NEGATIVE Final    Comment: (NOTE) Fact Sheet for Patients: BloggerCourse.com  Fact Sheet for Healthcare Providers: SeriousBroker.it  This test is not yet approved or cleared by the [place]  FDA and has been authorized for detection and/or diagnosis of SARS-CoV-2 by FDA under an Emergency Use Authorization (EUA). This EUA will remain in effect (meaning this test can be used) for the duration of the COVID-19 declaration under Section 564(b)(1) of the Act, 21 U.S.C. section 360bbb-3(b)(1), unless the authorization is terminated or revoked.  Performed at [facility], [address] [place], [place] [address]   Respiratory (~20 pathogens) panel by PCR     Status: Abnormal   Collection Time: 03/13/24 12:05 PM   Specimen: Nasopharyngeal Swab; Respiratory  Result Value Ref Range Status   Adenovirus NOT DETECTED NOT DETECTED Final   Coronavirus 229E NOT DETECTED NOT DETECTED Final    Comment: (NOTE) The Coronavirus on the Respiratory Panel, DOES NOT test for the novel  Coronavirus (2019 nCoV)    Coronavirus HKU1 NOT DETECTED NOT DETECTED Final    Coronavirus NL63 NOT DETECTED NOT DETECTED Final   Coronavirus OC43 NOT DETECTED NOT DETECTED Final   Metapneumovirus NOT DETECTED NOT DETECTED Final   Rhinovirus / Enterovirus DETECTED (A) NOT DETECTED Final   Influenza A NOT DETECTED NOT DETECTED Final   Influenza B NOT DETECTED NOT DETECTED Final   Parainfluenza Virus 1 NOT DETECTED NOT DETECTED Final   Parainfluenza Virus 2 NOT DETECTED NOT DETECTED Final   Parainfluenza Virus 3 NOT DETECTED NOT DETECTED Final   Parainfluenza Virus 4 NOT DETECTED NOT DETECTED Final   Respiratory Syncytial Virus NOT DETECTED NOT DETECTED Final   Bordetella pertussis NOT DETECTED NOT DETECTED Final   Bordetella Parapertussis NOT DETECTED NOT DETECTED Final   Chlamydophila pneumoniae NOT DETECTED NOT DETECTED Final   Mycoplasma pneumoniae NOT DETECTED NOT DETECTED Final    Comment: Performed at [facility], [address] [place], [place] [address]     Radiology Studies: DG Swallowing Func-Speech Pathology Result Date: 03/16/2024 Table formatting from the original result was not included. Modified Barium Swallow Study Patient Details Name: Jaisen Wiltrout. MRN: [id number] Date of Birth: 1934-09-20 Today's Date: 03/16/2024 HPI/PMH: HPI: Pt is an 89 yo male presenting to ED 9/25 with R sided lateral rib pain. CTA Chest shows progressive airway inflammation and extensive bibasilar airway impaction and peripheral consolidation in keeping with multifocal bronchopneumonia or aspiration. PMH includes HTN, COPD, T2DM, renal mass, nephrolithiasis, AAA, CKD 3B, anemia, GERD, THC and tobacco use, BPH Clinical Impression: Clinical Impression: Pt has a mild oropharyngeal dysphagia (DIGEST score of 1 also suggestive of mild dysphagia). Cognition seems to impact lingual control and transport, showing disorganization,  premature spillage, and oral residue only intermittently. His pharyngeal phase is more functional throughout most of the study but impaired timing, along  with reduced hyoid excursion and laryngeal vestibule closure, resulted in aspiration of thin liquids only via straw (PAS 8, silent aspiration). Penetration via cup was only observed when consuming the barium tablet with most of the barium being ejected, although note that the barium tablet also required multiple additional pureed and liquid boluses to clear his esophagus. Recommend continuing regular solids and thin liquids via cup (no straw). Would offer meds in puree, crushing if larger in size. Factors that may increase risk of adverse event in presence of aspiration Noe & Lianne 2021): No data recorded Recommendations/Plan: Swallowing Evaluation Recommendations Swallowing Evaluation Recommendations Recommendations: PO diet PO Diet Recommendation: Regular; Thin liquids (Level 0) Liquid Administration via: Cup; No straw Medication Administration: Whole meds with puree (crush if larger) Supervision: Patient able to self-feed; Intermittent supervision/cueing for swallowing strategies Swallowing strategies  : Minimize environmental distractions; Slow rate; Small bites/sips; Follow solids with liquids Postural changes: Position pt fully upright for meals; Stay upright 30-60 min after meals Oral care recommendations: Oral care BID (2x/day) Recommended consults: Consider esophageal assessment Treatment Plan Treatment Plan Treatment recommendations: Therapy as outlined in treatment plan below Follow-up recommendations: Skilled nursing-short term rehab (<3 hours/day) Functional status assessment: Patient has had a recent decline in their functional status and demonstrates the ability to make significant improvements in function in a reasonable and predictable amount of time. Treatment frequency: Min 2x/week Treatment duration: 2 weeks Interventions: Aspiration precaution training; Patient/family education; Compensatory techniques; Diet toleration management by SLP Recommendations Recommendations for follow up therapy  are one component of a multi-disciplinary discharge planning process, led by the attending physician.  Recommendations may be updated based on patient status, additional functional criteria and insurance authorization. Assessment: Orofacial Exam: Orofacial Exam Oral Cavity - Dentition: Missing dentition Anatomy: Anatomy: Suspected cervical osteophytes Boluses Administered: Boluses Administered Boluses Administered: Thin liquids (Level 0); Mildly thick liquids (Level 2, nectar thick); Puree; Solid  Oral Impairment Domain: Oral Impairment Domain Lip Closure: No labial escape Tongue control during bolus hold: Posterior escape of greater than half of bolus (due to cognition) Bolus preparation/mastication: Timely and efficient chewing and mashing Bolus transport/lingual motion: Repetitive/disorganized tongue motion Oral residue: Residue collection on oral structures Location of oral residue : Tongue Initiation of pharyngeal swallow : Pyriform sinuses  Pharyngeal Impairment Domain: Pharyngeal Impairment Domain Soft palate elevation: No bolus between soft palate (SP)/pharyngeal wall (PW) Laryngeal elevation: Complete superior movement of thyroid  cartilage with complete approximation of arytenoids to epiglottic petiole Anterior hyoid excursion: Partial anterior movement Epiglottic movement: Complete inversion Laryngeal vestibule closure: Incomplete, narrow column air/contrast in laryngeal vestibule Pharyngeal stripping wave : Present - complete Pharyngeal contraction (A/P view only): N/A Pharyngoesophageal segment opening: Partial distention/partial duration, partial obstruction of flow Tongue base retraction: No contrast between tongue base and posterior pharyngeal wall (PPW) Pharyngeal residue: Trace residue within or on pharyngeal structures Location of pharyngeal residue: Valleculae  Esophageal Impairment Domain: Esophageal Impairment Domain Esophageal clearance upright position: Esophageal retention with retrograde  flow below pharyngoesophageal segment (PES) Pill: Pill Consistency administered: Thin liquids (Level 0) Thin liquids (Level 0): Impaired (see clinical impressions) Penetration/Aspiration Scale Score: Penetration/Aspiration Scale Score 1.  Material does not enter airway: Mildly thick liquids (Level 2, nectar thick); Puree; Solid; Pill 8.  Material enters airway, passes BELOW cords without attempt by patient to eject out (silent aspiration) : Thin liquids (Level 0) Compensatory Strategies: Compensatory Strategies  Compensatory strategies: Yes Straw: Ineffective Ineffective Straw: Thin liquid (Level 0)   General Information: Caregiver present: No  Diet Prior to this Study: Regular; Thin liquids (Level 0)   Temperature : Normal   Respiratory Status: WFL   Supplemental O2: None (Room air)   History of Recent Intubation: No  Behavior/Cognition: Alert; Cooperative; Pleasant mood Self-Feeding Abilities: Able to self-feed Baseline vocal quality/speech: Normal No data recorded Volitional Swallow: Able to elicit Exam Limitations: No limitations Goal Planning: Prognosis for improved oropharyngeal function: Good No data recorded No data recorded Patient/Family Stated Goal: none stated Consulted and agree with results and recommendations: Patient; Nurse Pain: Pain Assessment Pain Assessment: No/denies pain End of Session: Start Time:SLP Start Time (ACUTE ONLY): 0825 Stop Time: SLP Stop Time (ACUTE ONLY): 0839 Time Calculation:SLP Time Calculation (min) (ACUTE ONLY): 14 min Charges: SLP Evaluations $ SLP Speech Visit: 1 Visit SLP Evaluations $MBS Swallow: 1 Procedure SLP visit diagnosis: SLP Visit Diagnosis: Dysphagia, oropharyngeal phase (R13.12) Past Medical History: Past Medical History: Diagnosis Date  Diabetes mellitus 01/2009 dx  DYSLIPIDEMIA   GERD   Hypertension   Kidney stones   RENAL CALCULUS 01/04/2009 Past Surgical History: Past Surgical History: Procedure Laterality Date  NO PAST SURGERIES   Leita SAILOR., M.A. CCC-SLP Acute  Rehabilitation Services Office: [phone] Secure chat preferred 03/16/2024, 10:19 AM   Scheduled Meds:  feeding supplement  237 mL Oral BID BM   heparin   5,000 Units Subcutaneous Q8H   insulin  aspart  0-5 Units Subcutaneous QHS   insulin  aspart  0-9 Units Subcutaneous TID WC   Continuous Infusions:     LOS: 3 days   Fredia Skeeter, MD [facility]  03/17/2024, 1:22 PM   *Please note that this is a verbal dictation therefore any spelling or grammatical errors are due to the Dragon Medical One system interpretation.  Please page via Amion and do not message via secure chat for urgent patient care matters. Secure chat can be used for non urgent patient care matters.  How to contact the [facility] Attending or Consulting provider 7A - 7P or covering provider during after hours 7P -7A, for this patient?  Check the care team in [facility] and look for a) attending/consulting [facility] provider listed and b) the [facility] team listed. Page or secure chat 7A-7P. Log into [email or web address] and use [facility]'s universal password to access. If you do not have the password, please contact the hospital operator. Locate the [facility] provider you are looking for under [facility] and page to a number that you can be directly reached. If you still have difficulty reaching the provider, please page the [facility] (Director on Call) for the Hospitalists listed on amion for assistance.

## 2024-03-18 DIAGNOSIS — J188 Other pneumonia, unspecified organism: Secondary | ICD-10-CM

## 2024-03-18 LAB — GLUCOSE, CAPILLARY: Glucose-Capillary: 127 mg/dL — ABNORMAL HIGH (ref 70–99)

## 2024-03-18 MED ORDER — CYCLOBENZAPRINE HCL 5 MG PO TABS
2.5000 mg | ORAL_TABLET | Freq: Once | ORAL | Status: AC
  Administered 2024-03-18: 2.5 mg via ORAL
  Filled 2024-03-18: qty 1

## 2024-03-18 MED ORDER — FENTANYL CITRATE PF 50 MCG/ML IJ SOSY
6.2500 ug | PREFILLED_SYRINGE | Freq: Once | INTRAMUSCULAR | Status: AC
  Administered 2024-03-18: 6.5 ug via INTRAVENOUS
  Filled 2024-03-18: qty 1

## 2024-03-18 NOTE — Progress Notes (Signed)
 Occupational Therapy Treatment Patient Details Name: Vincent Baxter. MRN: [id number] DOB: 07-07-1934 Today's Date: 03/18/2024   History of present illness 89 yo M adm 03/12/24 Rt rib pain and multifocal bronchopneumonia or aspiration and leg pain. PMH: HTN, COPD, T2DM, renal mass, nephrolithiasis, AAA, CKD, anemia, GERD, THC and tobacco use, BPH   OT comments  Pt agitated throughout session, limited progress towards goals this session. Pt stating multiple times I am not mad at you but also refusing to listen to education or intervention or assessment by therapist at this time. Pt was able to perform dressing globally at min A. Pt complaining of R sided weakness and pain (dominant side) but then report of pain description and location of pain changed constantly when OT attempted to assess. Pt saying things like My ankle...you know what that is don't you Pt with new Rollator in room. OT provided demonstration for how to walk with Rollator, use rollator as a seat, how to get to the storage, and how to fold up Rollator. Special attention to break safety with transfers (demonstration and verbal education). Pt immediately disregarded all education, sat on rollator, started pushing himself around then when OT voiced safety concerns he performed sit<>stand x2 very unsafe almost missing the seat. Pt looked right at OT and said I am going to do what I want Pt did allow OT to use pulse ox and SpO2 and HR WFL (90%, 76 BPM). At the end of the session Pt refusing to get out of Rollator and back to bed (no chair in room). RN made aware. This patient remains an extremely high fall risk due to decreased balance, safety awareness, activity tolerance, cognition and he should go to post-acute rehab. Today he was provided education on multiple ADL topics and basic safety/function of Rollator which he would not return demonstrate or verbally confirm understanding. I am concerned about his ability to function safely at  home and feel he is very high risk for re-admission. Suspect low health literacy. OT will continue to follow acutely.       If plan is discharge home, recommend the following:  A little help with walking and/or transfers;A little help with bathing/dressing/bathroom;Assistance with cooking/housework;Assist for transportation;Help with stairs or ramp for entrance   Equipment Recommendations  Other (comment) (defer)    Recommendations for Other Services      Precautions / Restrictions Precautions Precautions: Fall Restrictions Weight Bearing Restrictions Per Provider Order: No       Mobility Bed Mobility Overal bed mobility: Independent                  Transfers Overall transfer level: Needs assistance Equipment used: Rollator (4 wheels) Transfers: Sit to/from Stand Sit to Stand: Contact guard assist           General transfer comment: impulsive at times, requires UB support and reaching out for environment, posterior LOB in standing. Provided demonstration of safe use of Rollator including breaking, how to access storage under seat, and how to fold up rollator. Pt did not seem to retain any safety for transfers or use of Rollator. He immediately sat on it, pushing it around from seated position and when OT told him to use breaks he proceeded to unsafely perform multiple sit<>stand with unlocked Rollator     Balance Overall balance assessment: Needs assistance Sitting-balance support: No upper extremity supported, Feet supported Sitting balance-Leahy Scale: Fair     Standing balance support: Bilateral upper extremity supported, During functional activity, Reliant  on assistive device for balance Standing balance-Leahy Scale: Poor Standing balance comment: Rollator                           ADL either performed or assessed with clinical judgement   ADL Overall ADL's : Needs assistance/impaired                 Upper Body Dressing : Set  up;Sitting (shirt and jacket) Upper Body Dressing Details (indicate cue type and reason): are you going to help me or what? Lower Body Dressing: Minimal assistance;Sit to/from stand Lower Body Dressing Details (indicate cue type and reason): balance, decreased safety awareness Toilet Transfer: Contact guard assist;Minimal assistance;Ambulation;Rollator (4 wheels) Toilet Transfer Details (indicate cue type and reason): simulated through SPT Pt using rollator as chair         Functional mobility during ADLs: Contact guard assist;Minimal assistance;Rolling walker (2 wheels) General ADL Comments: i am going to do what I am going to do attempted to bring up safety during ADL in home environment and Pt not open to conversation    Extremity/Trunk Assessment Upper Extremity Assessment Upper Extremity Assessment: Generalized weakness (L=R Pt complaining about pain and weakness in R side. very inconsistent report from min to min, and limited participation in strength/sensation assessment today)   Lower Extremity Assessment Lower Extremity Assessment:  (Pt complaining of R ankle pain, up his leg, up his back. Pt minimally participatory in MMT/assessment)        Vision       Perception     Praxis     Communication Communication Communication: No apparent difficulties   Cognition Arousal: Alert Behavior During Therapy: Agitated Cognition: Cognition impaired     Awareness: Online awareness impaired, Intellectual awareness impaired Memory impairment (select all impairments): Short-term memory, Working memory Attention impairment (select first level of impairment): Focused attention Executive functioning impairment (select all impairments): Reasoning, Problem solving OT - Cognition Comments: Pt very agitated, at this time. Pt unhappy no matter what therapist did. Complaining about pain, but inconsistent in pain report when therapist tried to evaluate. Attempted education with ZERO  retention, Pt stating I am going to do what I want Pt not following directions/requests for participation due to agitation today. Pt perseverating on getting to his car, that no one is helping him despite multiple attempts by this therapist to assist, educate, and meet Pt needs.                 Following commands: Impaired Following commands impaired: Follows one step commands inconsistently (impacted by agitation.)      Cueing   Cueing Techniques: Verbal cues  Exercises      Shoulder Instructions       General Comments      Pertinent Vitals/ Pain       Pain Assessment Pain Assessment: Faces Faces Pain Scale: Hurts even more Pain Location: R side, moved and changed throughout session - inconsistent Pain Descriptors / Indicators: Constant (evolving, changing, agitation when i asked him to describe) Pain Intervention(s): Monitored during session, Repositioned  Home Living                                          Prior Functioning/Environment              Frequency  Min 2X/week  Progress Toward Goals  OT Goals(current goals can now be found in the care plan section)  Progress towards OT goals: Not progressing toward goals - comment (limited by agitation)  Acute Rehab OT Goals Patient Stated Goal: get to my car OT Goal Formulation: With patient Time For Goal Achievement: 03/28/24 Potential to Achieve Goals: Poor ADL Goals Pt Will Perform Grooming: with supervision;standing Pt Will Perform Lower Body Dressing: with supervision;sit to/from stand Pt Will Transfer to Toilet: with supervision;ambulating Pt Will Perform Toileting - Clothing Manipulation and hygiene: with modified independence;sitting/lateral leans;sit to/from stand  Plan      Co-evaluation                 AM-PAC OT 6 Clicks Daily Activity     Outcome Measure   Help from another person eating meals?: A Little Help from another person taking care of  personal grooming?: A Little Help from another person toileting, which includes using toliet, bedpan, or urinal?: A Little Help from another person bathing (including washing, rinsing, drying)?: A Little Help from another person to put on and taking off regular upper body clothing?: A Little Help from another person to put on and taking off regular lower body clothing?: A Little 6 Click Score: 18    End of Session Equipment Utilized During Treatment: Rollator (4 wheels)  OT Visit Diagnosis: Unsteadiness on feet (R26.81);Muscle weakness (generalized) (M62.81)   Activity Tolerance Treatment limited secondary to agitation   Patient Left with call bell/phone within reach;Other (comment) (sitting on Rollator)   Nurse Communication Mobility status;Other (comment) (Pt sitting on Rollator and refusing to go back to bed)        Time: 8951-8884 OT Time Calculation (min): 27 min  Charges: OT General Charges $OT Visit: 1 Visit OT Treatments $Self Care/Home Management : 8-22 mins  Leita DEL OTR/L Acute Rehabilitation Services Office: [phone]   Leita PARAS [facility] 03/18/2024, 12:04 PM

## 2024-03-18 NOTE — Progress Notes (Signed)
 PT Cancellation Note  Patient Details Name: Vincent Baxter. MRN: [id number] DOB: October 02, 1934   Cancelled Treatment:    Reason Eval/Treat Not Completed: Patient declined, no reason specified. Pt refused any mobility or instructions for use of rollator.   Rodgers ORN [facility] 03/18/2024, 11:49 AM Rodgers Opal PT Acute [employer] [phone]

## 2024-03-18 NOTE — TOC Progression Note (Addendum)
 Transition of Care (TOC) - Progression Note    Patient Details  Name: Vincent Baxter. MRN: [id number] Date of Birth: [birth year]  Transition of Care [facility]) CM/SW Contact  Rosaline JONELLE Joe, RN Phone Number: 03/18/2024, 9:37 AM  Clinical Narrative:    CM spoke with Deirdre, IP MSW and and patient was declined for SNF placement through [facility] provider.  Dr. Vernon states that patient lives alone.  I called and spoke with the patient's nephew by phone and he states that patient is forgetful and has had PTSD for the past 25 years and he is the only family member that is able to understand the patient's short temper.  The nephew states that patient is unable tto live with him since nephew has an adult autistic son at his house.  Nephew plans to call and speak with the patient's sister to see if she is able/willing tp provide care at her home in the meantime.  MD updated that I am waiting to speak with the nephew to coordinate a safe plan for discharge for the patient.  03/18/24 0930-  I called and spoke with the patient's nephew and he states that patient's sister is unable to have patient come and live with her at her home due to her health issues.  The patient's nephew states that he and his daughter are able to check on the patient at his own home daily if patient is discharged.  I provided Medicare choice regarding home health to the nephew and he did not have a preference.  I called Hedda CHEADLE and Darleene, RNCM accepted for [facility] Pt, OT aide.  HH orders placed - MD to co-sign.  Patient's nephew plans to provide transportation to home today at 2 pm.  Bedside nursing is aware.  Nephew granted permission to share FL2 with [employer] to assist with placement in the community.  FL2 faxed to [employer] and she will call the patient/nephew and follow up regarding assistance with placement in the community.  Patient needs rolator - nephew did not have preference regarding DMe agency.  I called [employer]  and requested delivery of Rolator to the bedside today.  I called and spoke with April, RNCM at [facility] and [place] is now aware that patient admitted to the hospital - Patient is active with Dr. Mardi at [facility] - nephew plans to schedule hospital follow up and provide transportation.  [facility] MS[facility] Shcaleah Kelton [phone] x [phone].  Patient will discharge home with family today at 2 pm via family car.  Expected Discharge Plan: Skilled Nursing Facility Barriers to Discharge: Continued Medical Work up               Expected Discharge Plan and Services       Living arrangements for the past 2 months: Skilled Nursing Facility Expected Discharge Date: 03/13/24                                     Social Drivers of Health (SDOH) Interventions SDOH Screenings   Food Insecurity: No Food Insecurity (03/14/2024)  Housing: Low Risk  (03/14/2024)  Transportation Needs: No Transportation Needs (03/14/2024)  Utilities: Not At Risk (03/14/2024)  Social Connections: Unknown (03/14/2024)  Recent Concern: Social Connections - Socially Isolated (03/13/2024)  Tobacco Use: High Risk (03/12/2024)    Readmission Risk Interventions     No data to display

## 2024-03-18 NOTE — Plan of Care (Signed)

## 2024-03-18 NOTE — Progress Notes (Signed)
 Pt is very irritated and upset, pt feel like he is not being help and taken care of, pt c/o of pain running from his (R) ankle all the way up to his back under (R) rib. He stated that he came into the hospital for this particular reason and feel like nothing is happening. 09/26 ABI was done, chest xray and CT angio. 0118-Spoke with doctor on call to let him know about pt concerns, small dose of fentaNYL given 6.5 mcg. 0215- Enter pain room, pt is still very much irritated and let me know that nothing is still not being done, because he have some stiffness in his ankle, I then reach out to the doctor on call and asked if he can come to the floor to see the patient because he is not happy at this pt. Dr MARLA, came to the floor assist pt. Flexeril  2.5 mg ordered and given to pt.

## 2024-03-18 NOTE — Progress Notes (Signed)
    Durable Medical Equipment  (From admission, onward)           Start     Ordered   03/18/24 1006  For home use only DME 4 wheeled rolling walker with seat  Once       Question:  Patient needs a walker to treat with the following condition  Answer:  Pneumonia   03/18/24 1006

## 2024-03-18 NOTE — Discharge Summary (Signed)
 Physician Discharge Summary  Vincent Baxter. FMW:[id number] DOB: 12/30/1934 DOA: 03/12/2024  PCP: [facility]  Admit date: 03/12/2024 Discharge date: 03/18/2024    Admitted From: Home Disposition: Home  Recommendations for Outpatient Follow-up:  Follow up with PCP in 1-2 weeks Please obtain BMP/CBC in one week Follow-up with vascular surgery-they will schedule and call you. Please follow up with your PCP on the following pending results: Unresulted Labs (From admission, onward)    None         Home Health: Yes Equipment/Devices: None none  Discharge Condition: Stable CODE STATUS: Full code Diet recommendation:  Diet Order             Diet heart healthy/carb modified Room service appropriate? Yes; Fluid consistency: Thin  Diet effective now                 Due to brief hospitalization, I have copied admitting hospitalist HPI and ED course as below.  HPI: Vincent Toral. is a 89 y.o. male with medical history significant of hypertension, HFmrEF, COPD, type 2 diabetes, renal mass, nephrolithiasis, AAA, CKD stage IIIb, anemia, GERD, THC and tobacco abuse, BPH presenting with a chief complaint of right-sided lateral rib pain which has been ongoing for several months.  He denies any recent falls or trauma.  He is also reporting shortness of breath and cough for several weeks.  Denies fevers.  Patient states he is not able to taste food and has lost his appetite over the past few months.  Reports losing weight but he is not sure how much.  He is also endorsing bilateral leg pain which is worse with exertion x 3 months.  No other complaints.   ED course: Vital signs stable.  EKG showing sinus rhythm and no acute ischemic changes.  Labs showing no leukocytosis, hemoglobin normal, platelet count 144k (chronically low and no significant change from baseline), potassium 5.4, bicarb 19 (chronically low and stable), anion gap 12, glucose 170, BUN 47, creatinine 1.7  (stable), normal lipase and LFTs, lactic acid normal x 2, blood cultures in process, troponin 108> 105, BNP 409, UA not suggestive of infection.    Subjective: Patient seen and examined.  He denied any shortness of breath at all.  Continues to complain of right ankle pain and stiffness.  Social worker as well as I myself spoke to the patient and updated him about denial by his insurance company even after peer to peer.  We offered him long-term care.  He is not interested in that.  He wants to go home today and would consider long-term care at home.  Brief/Interim Summary: Details of the hospitalization below.  Multifocal pneumonia  Presented with shortness of breath, CT chest showing progressive airway inflammation and extensive bibasilar airway impaction and peripheral consolidation suspicious for multifocal bronchopneumonia versus aspiration.  No fever, leukocytosis, or lactic acidosis.  No signs of sepsis.  Patient was never hypoxic.SABRA  Test for COVID/flu/RSV negative.  Patient never complained of shortness of breath.  He completed course of antibiotics here.   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 In addition, showing 50% stenosis of the mid left renal artery. Consulted vascular surgery, ABI obtained which are normal according to vascular surgery. Per them, he does not have any signs of claudication, PAD, does not need any  intervention. CTA showing 5 cm abdominal aortic aneurysm. He has no abdominal pain. Vascular surgery recommends outpatient follow-up. Per them, in men they repair there is greater than 5.5 cm and his aneurysm does not meet criteria for repair. He mainly complains of bilateral leg stiffness. Seen by PT OT, they recommended SNF which was denied by the insurance company even  after I completed peer to peer due to the fact that patient had walked 50 feet.  We offered patient long-term care but he declined that.  Home health is being set up for him.   Right-sided lateral rib pain Appears to be chronic/ongoing for several months.  Patient denies any recent falls or trauma.  No evidence of rib fractures on chest imaging.   Elevated troponin Likely due to demand ischemia.  Troponin elevated but stable, pattern not consistent with ACS.  EKG without acute ischemic changes.  Patient is not endorsing any substernal or left-sided chest pain/pressure.   Mild hyperkalemia in the setting of CKD stage IIIb No acute EKG changes.  A dose of Lokelma  given, potassium normalized.   Unintentional weight loss CT chest/abdomen/pelvis without any findings to suggest malignancy.   Chronic HFmrEF Last echo done in October 2023 showing EF 40 to 45%, grade 2 diastolic dysfunction, severe concentric LVH concerning for possible HCM versus amyloidosis, moderate biatrial dilation, and trivial mitral regurgitation.  BNP 409 but patient does not appear volume overloaded on exam.  No evidence of pulmonary edema on imaging.  Repeat echocardiogram shows EF of 25 to 30% and grade 2 diastolic dysfunction.  Patient is asymptomatic as far as CHF goes.  [facility]  Recommended close follow-up with cardiology.   Discharge plan was discussed with patient and/or family member/and they verbalized understanding and agreed with it.  Discharge Diagnoses:  Principal Problem:   Multifocal pneumonia Active Problems:   DM2 (diabetes mellitus, type 2) (HCC)   COPD (chronic obstructive pulmonary disease) with emphysema (HCC)   Claudication of both lower extremities   CKD (chronic kidney disease) stage 3, GFR 30-59 ml/min (HCC)    Discharge Instructions   Allergies as of 03/18/2024   No Known Allergies      Medication List     STOP taking these medications    amoxicillin  500 MG  tablet Commonly known as: AMOXIL        TAKE these medications    acetaminophen  500 MG tablet Commonly known as: TYLENOL  Take 1 tablet (500 mg total) by mouth every 6 (six) hours as needed for moderate pain (pain score 4-6).   amLODipine  5 MG tablet Commonly known as: NORVASC  Take 1 tablet (5 mg total) by mouth daily.   diclofenac  Sodium 1 % Gel Commonly known as: VOLTAREN  Apply 4 g topically 4 (four) times daily. What changed:  when to take this reasons to take this   HYDROcodone -acetaminophen  5-325 MG tablet Commonly known as: NORCO/VICODIN Take 1 tablet by mouth every 6 (six) hours as needed for moderate pain (pain score 4-6).   lidocaine  5 % Commonly known as: Lidoderm  Place 1 patch onto the skin daily. Remove & Discard patch within 12 hours or as directed by MD What changed:  when to take this reasons to take this additional instructions   tamsulosin  0.4 MG Caps capsule Commonly known as: Flomax  Take 1 capsule (0.4 mg total) by mouth daily.               Durable Medical Equipment  (From admission, onward)  Start     Ordered   03/18/24 1006  For home use only DME 4 wheeled rolling walker with seat  Once       Question:  Patient needs a walker to treat with the following condition  Answer:  Pneumonia   03/18/24 1006            Follow-up Information     [facility]   Contact information: [address] [facility] [place] [address] [phone]                No Known Allergies  Consultations: Vascular surgery   Procedures/Studies: DG Swallowing Func-Speech Pathology Result Date: 03/16/2024 Table formatting from the original result was not included. Modified Barium Swallow Study Patient Details Name: Vincent Discher. MRN: [id number] Date of Birth: [birth year] Today's Date: 03/16/2024 HPI/PMH: HPI: Pt is an 89 yo male presenting to ED 9/25 with R sided lateral rib pain. CTA  Chest shows progressive airway inflammation and extensive bibasilar airway impaction and peripheral consolidation in keeping with multifocal bronchopneumonia or aspiration. PMH includes HTN, COPD, T2DM, renal mass, nephrolithiasis, AAA, CKD 3B, anemia, GERD, THC and tobacco use, BPH Clinical Impression: Clinical Impression: Pt has a mild oropharyngeal dysphagia (DIGEST score of 1 also suggestive of mild dysphagia). Cognition seems to impact lingual control and transport, showing disorganization, premature spillage, and oral residue only intermittently. His pharyngeal phase is more functional throughout most of the study but impaired timing, along with reduced hyoid excursion and laryngeal vestibule closure, resulted in aspiration of thin liquids only via straw (PAS 8, silent aspiration). Penetration via cup was only observed when consuming the barium tablet with most of the barium being ejected, although note that the barium tablet also required multiple additional pureed and liquid boluses to clear his esophagus. Recommend continuing regular solids and thin liquids via cup (no straw). Would offer meds in puree, crushing if larger in size. Factors that may increase risk of adverse event in presence of aspiration Noe & Lianne 2021): No data recorded Recommendations/Plan: Swallowing Evaluation Recommendations Swallowing Evaluation Recommendations Recommendations: PO diet PO Diet Recommendation: Regular; Thin liquids (Level 0) Liquid Administration via: Cup; No straw Medication Administration: Whole meds with puree (crush if larger) Supervision: Patient able to self-feed; Intermittent supervision/cueing for swallowing strategies Swallowing strategies  : Minimize environmental distractions; Slow rate; Small bites/sips; Follow solids with liquids Postural changes: Position pt fully upright for meals; Stay upright 30-60 min after meals Oral care recommendations: Oral care BID (2x/day) Recommended consults: Consider  esophageal assessment Treatment Plan Treatment Plan Treatment recommendations: Therapy as outlined in treatment plan below Follow-up recommendations: Skilled nursing-short term rehab (<3 hours/day) Functional status assessment: Patient has had a recent decline in their functional status and demonstrates the ability to make significant improvements in function in a reasonable and predictable amount of time. Treatment frequency: Min 2x/week Treatment duration: 2 weeks Interventions: Aspiration precaution training; Patient/family education; Compensatory techniques; Diet toleration management by SLP Recommendations Recommendations for follow up therapy are one component of a multi-disciplinary discharge planning process, led by the attending physician.  Recommendations may be updated based on patient status, additional functional criteria and insurance authorization. Assessment: Orofacial Exam: Orofacial Exam Oral Cavity - Dentition: Missing dentition Anatomy: Anatomy: Suspected cervical osteophytes Boluses Administered: Boluses Administered Boluses Administered: Thin liquids (Level 0); Mildly thick liquids (Level 2, nectar thick); Puree; Solid  Oral Impairment Domain: Oral Impairment Domain Lip Closure: No labial escape Tongue control during bolus hold: Posterior  escape of greater than half of bolus (due to cognition) Bolus preparation/mastication: Timely and efficient chewing and mashing Bolus transport/lingual motion: Repetitive/disorganized tongue motion Oral residue: Residue collection on oral structures Location of oral residue : Tongue Initiation of pharyngeal swallow : Pyriform sinuses  Pharyngeal Impairment Domain: Pharyngeal Impairment Domain Soft palate elevation: No bolus between soft palate (SP)/pharyngeal wall (PW) Laryngeal elevation: Complete superior movement of thyroid  cartilage with complete approximation of arytenoids to epiglottic petiole Anterior hyoid excursion: Partial anterior movement  Epiglottic movement: Complete inversion Laryngeal vestibule closure: Incomplete, narrow column air/contrast in laryngeal vestibule Pharyngeal stripping wave : Present - complete Pharyngeal contraction (A/P view only): N/A Pharyngoesophageal segment opening: Partial distention/partial duration, partial obstruction of flow Tongue base retraction: No contrast between tongue base and posterior pharyngeal wall (PPW) Pharyngeal residue: Trace residue within or on pharyngeal structures Location of pharyngeal residue: Valleculae  Esophageal Impairment Domain: Esophageal Impairment Domain Esophageal clearance upright position: Esophageal retention with retrograde flow below pharyngoesophageal segment (PES) Pill: Pill Consistency administered: Thin liquids (Level 0) Thin liquids (Level 0): Impaired (see clinical impressions) Penetration/Aspiration Scale Score: Penetration/Aspiration Scale Score 1.  Material does not enter airway: Mildly thick liquids (Level 2, nectar thick); Puree; Solid; Pill 8.  Material enters airway, passes BELOW cords without attempt by patient to eject out (silent aspiration) : Thin liquids (Level 0) Compensatory Strategies: Compensatory Strategies Compensatory strategies: Yes Straw: Ineffective Ineffective Straw: Thin liquid (Level 0)   General Information: Caregiver present: No  Diet Prior to this Study: Regular; Thin liquids (Level 0)   Temperature : Normal   Respiratory Status: WFL   Supplemental O2: None (Room air)   History of Recent Intubation: No  Behavior/Cognition: Alert; Cooperative; Pleasant mood Self-Feeding Abilities: Able to self-feed Baseline vocal quality/speech: Normal No data recorded Volitional Swallow: Able to elicit Exam Limitations: No limitations Goal Planning: Prognosis for improved oropharyngeal function: Good No data recorded No data recorded Patient/Family Stated Goal: none stated Consulted and agree with results and recommendations: Patient; Nurse Pain: Pain Assessment Pain  Assessment: No/denies pain End of Session: Start Time:SLP Start Time (ACUTE ONLY): 0825 Stop Time: SLP Stop Time (ACUTE ONLY): 0839 Time Calculation:SLP Time Calculation (min) (ACUTE ONLY): 14 min Charges: SLP Evaluations $ SLP Speech Visit: 1 Visit SLP Evaluations $MBS Swallow: 1 Procedure SLP visit diagnosis: SLP Visit Diagnosis: Dysphagia, oropharyngeal phase (R13.12) Past Medical History: Past Medical History: Diagnosis Date  Diabetes mellitus 01/2009 dx  DYSLIPIDEMIA   GERD   Hypertension   Kidney stones   RENAL CALCULUS 01/04/2009 Past Surgical History: Past Surgical History: Procedure Laterality Date  NO PAST SURGERIES   Leita SAILOR., M.A. CCC-SLP Acute Rehabilitation Services Office: [phone] Secure chat preferred 03/16/2024, 10:19 AM  VAS US  ABI WITH/WO TBI Result Date: 03/13/2024  LOWER EXTREMITY DOPPLER STUDY Patient Name:  Vincent HAMP MORELAND.  Date of Exam:   03/13/2024 Medical Rec #: [id number]           Accession #:    [id number] Date of Birth: 09-09-1934          Patient Gender: M Patient Age:   60 years Exam Location:  [facility] Procedure:      VAS US  ABI WITH/WO TBI Referring Phys: EDITHA RATHORE --------------------------------------------------------------------------------  Indications: Claudication. High Risk Factors: Hypertension, Diabetes, current smoker. Other Factors: CKD, AAA,.  Comparison Study: No previous exams Performing Technologist: Leigh Rom RVT/RDMS  Examination Guidelines: A complete evaluation includes at minimum, Doppler waveform signals and systolic blood pressure reading at the level of  bilateral brachial, anterior tibial, and posterior tibial arteries, when vessel segments are accessible. Bilateral testing is considered an integral part of a complete examination. Photoelectric Plethysmograph (PPG) waveforms and toe systolic pressure readings are included as required and additional duplex testing as needed. Limited examinations for reoccurring indications may be  performed as noted.  ABI Findings: +---------+------------------+-----+-----------+--------+ Right    Rt Pressure (mmHg)IndexWaveform   Comment  +---------+------------------+-----+-----------+--------+ Brachial 115                    biphasic            +---------+------------------+-----+-----------+--------+ PTA      116               0.97 multiphasic         +---------+------------------+-----+-----------+--------+ DP       128               1.08 multiphasic         +---------+------------------+-----+-----------+--------+ Great Toe75                0.63 Abnormal            +---------+------------------+-----+-----------+--------+ +---------+------------------+-----+----------+----------------+ Left     Lt Pressure (mmHg)IndexWaveform  Comment          +---------+------------------+-----+----------+----------------+ Brachial 119                    biphasic                   +---------+------------------+-----+----------+----------------+ PTA                             monophasicnon compressible +---------+------------------+-----+----------+----------------+ DP       76                0.64 monophasic                 +---------+------------------+-----+----------+----------------+ Great Toe0                 0.00 Absent                     +---------+------------------+-----+----------+----------------+  Summary: Right: Resting right ankle-brachial index is within normal range. The right toe-brachial index is abnormal.  Left: Resting left ankle-brachial index indicates noncompressible left lower extremity arteries. The left toe-brachial index is absent.  *See table(s) above for measurements and observations.  Electronically signed by Lonni Gaskins MD on 03/13/2024 at 3:13:02 PM.    Final    ECHOCARDIOGRAM COMPLETE Result Date: 03/13/2024    ECHOCARDIOGRAM REPORT   Patient Name:   Vincent Baxter. Date of Exam: 03/13/2024 Medical Rec #:  [id number]           Height:       66.0 in Accession #:    [id number]         Weight:       110.0 lb Date of Birth:  1934-11-06         BSA:          1.551 m Patient Age:    88 years           BP:           135/87 mmHg Patient Gender: M                  HR:           79 bpm. Exam Location:  Inpatient Procedure: 2D Echo, 3D Echo, Cardiac Doppler, Color Doppler and Strain Analysis            (Both Spectral and Color Flow Doppler were utilized during            procedure). Indications:    I50.33 Acute on chronic diastolic (congestive) heart failure  History:        Patient has prior history of Echocardiogram examinations, most                 recent 03/23/2022. Cardiomyopathy, Abnormal ECG, COPD,                 Arrythmias:PVC, PAC and Bradycardia; Risk Factors:Current                 Smoker, Hypertension, Diabetes and Dyslipidemia. LVH.  Sonographer:    Ellouise Mose RDCS Referring Phys: ALFORNIA MADISON  Sonographer Comments: Patient talking throughout exam. IMPRESSIONS  1. No obstruction. Left ventricular ejection fraction, by estimation, is 25 to 30%. Left ventricular ejection fraction by 3D volume is 28 %. Left ventricular ejection fraction by 2D MOD biplane is 29.9 %. Left ventricular ejection fraction by PLAX is 22  %. The left ventricle has severely decreased function. The left ventricle demonstrates global hypokinesis. There is severe concentric left ventricular hypertrophy. Left ventricular diastolic parameters are consistent with Grade II diastolic dysfunction (pseudonormalization). The average left ventricular global longitudinal strain is -10.0 %. The global longitudinal strain is abnormal.  2. Right ventricular systolic function is mildly reduced. The right ventricular size is normal. Severely increased right ventricular wall thickness. There is normal pulmonary artery systolic pressure. The estimated right ventricular systolic pressure is  17.1 mmHg.  3. A small pericardial effusion is present. The pericardial effusion  is posterior to the left ventricle and localized near the right atrium. There is no evidence of cardiac tamponade.  4. The mitral valve is normal in structure. Trivial mitral valve regurgitation. No evidence of mitral stenosis.  5. The aortic valve is tricuspid. There is moderate calcification of the aortic valve. There is moderate thickening of the aortic valve. Aortic valve regurgitation is not visualized. Aortic valve sclerosis is present, with no evidence of aortic valve stenosis.  6. The inferior vena cava is dilated in size with >50% respiratory variability, suggesting right atrial pressure of 8 mmHg. Conclusion(s)/Recommendation(s): Consider cardiac amyloidosis, HCM. FINDINGS  Left Ventricle: No obstruction. Left ventricular ejection fraction, by estimation, is 25 to 30%. Left ventricular ejection fraction by PLAX is 22 %. Left ventricular ejection fraction by 2D MOD biplane is 29.9 %. Left ventricular ejection fraction by 3D  volume is 28 %. The left ventricle has severely decreased function. The left ventricle demonstrates global hypokinesis. The average left ventricular global longitudinal strain is -10.0 %. Strain was performed and the global longitudinal strain is abnormal. The left ventricular internal cavity size was normal in size. There is severe concentric left ventricular hypertrophy. Left ventricular diastolic parameters are consistent with Grade II diastolic dysfunction (pseudonormalization). Right Ventricle: The right ventricular size is normal. Severely increased right ventricular wall thickness. Right ventricular systolic function is mildly reduced. There is normal pulmonary artery systolic pressure. The tricuspid regurgitant velocity is 1.88 m/s, and with an assumed right atrial pressure of 3 mmHg, the estimated right ventricular systolic pressure is 17.1 mmHg. Left Atrium: Left atrial size was normal in size. Right Atrium: Right atrial size was normal in size. Pericardium: A small  pericardial effusion is present. The pericardial effusion is  posterior to the left ventricle and localized near the right atrium. There is no evidence of cardiac tamponade. Mitral Valve: The mitral valve is normal in structure. There is moderate thickening of the mitral valve leaflet(s). Trivial mitral valve regurgitation. No evidence of mitral valve stenosis. Tricuspid Valve: The tricuspid valve is normal in structure. Tricuspid valve regurgitation is trivial. No evidence of tricuspid stenosis. Aortic Valve: The aortic valve is tricuspid. There is moderate calcification of the aortic valve. There is moderate thickening of the aortic valve. Aortic valve regurgitation is not visualized. Aortic valve sclerosis is present, with no evidence of aortic valve stenosis. Pulmonic Valve: The pulmonic valve was normal in structure. Pulmonic valve regurgitation is not visualized. No evidence of pulmonic stenosis. Aorta: The aortic root and ascending aorta are structurally normal, with no evidence of dilitation. Venous: The inferior vena cava is dilated in size with greater than 50% respiratory variability, suggesting right atrial pressure of 8 mmHg. IAS/Shunts: No atrial level shunt detected by color flow Doppler. Additional Comments: 3D was performed not requiring image post processing on an independent workstation and was abnormal.  LEFT VENTRICLE PLAX 2D                        Biplane EF (MOD) LV EF:         Left            LV Biplane EF:   Left                ventricular                      ventricular                ejection                         ejection                fraction by                      fraction by                PLAX is 22                       2D MOD                %.                               biplane is LVIDd:         3.10 cm                          29.9 %. LVIDs:         2.80 cm LV PW:         1.90 cm LV IVS:        2.10 cm         2D Longitudinal LVOT diam:     2.10 cm         Strain LV SV:          34              2D Strain GLS   -10.0 % LV SV Index:   22  Avg: LVOT Area:     3.46 cm LV IVRT:       46 msec         3D Volume EF                                LV 3D EF:    Left                                             ventricul LV Volumes (MOD)                            ar LV vol d, MOD    70.4 ml                    ejection A2C:                                        fraction LV vol d, MOD    52.5 ml                    by 3D A4C:                                        volume is LV vol s, MOD    43.7 ml                    28 %. A2C: LV vol s, MOD    39.3 ml A4C:                           3D Volume EF: LV SV MOD A2C:   26.7 ml       3D EF:        28 % LV SV MOD A4C:   52.5 ml       LV EDV:       85 ml LV SV MOD BP:    18.4 ml       LV ESV:       61 ml                                LV SV:        24 ml RIGHT VENTRICLE             IVC RV S prime:     11.50 cm/s  IVC diam: 2.30 cm TAPSE (M-mode): 1.2 cm LEFT ATRIUM             Index        RIGHT ATRIUM           Index LA diam:        3.50 cm 2.26 cm/m   RA Area:     10.80 cm LA Vol (A2C):   32.6 ml 21.02 ml/m  RA Volume:   20.50 ml  13.22 ml/m LA Vol (A4C):   24.6 ml 15.86 ml/m LA Biplane Vol: 29.2 ml 18.83 ml/m  AORTIC  VALVE LVOT Vmax:   64.50 cm/s LVOT Vmean:  39.300 cm/s LVOT VTI:    0.099 m  AORTA Ao Root diam: 3.30 cm Ao Asc diam:  3.20 cm MITRAL VALVE               TRICUSPID VALVE MV Area (PHT): 4.06 cm    TR Peak grad:   14.1 mmHg MV Decel Time: 187 msec    TR Vmax:        188.00 cm/s MV E velocity: 90.70 cm/s MV A velocity: 47.70 cm/s  SHUNTS MV E/A ratio:  1.90        Systemic VTI:  0.10 m                            Systemic Diam: 2.10 cm Oneil Parchment MD Electronically signed by Oneil Parchment MD Signature Date/Time: 03/13/2024/10:30:00 AM    Final    CT Angio Chest/Abd/Pel for Dissection W and/or Wo Contrast Result Date: 03/12/2024 EXAM: CTA CHEST, ABDOMEN AND PELVIS WITH AND WITHOUT IV CONTRAST 03/12/2024 07:31:23 PM TECHNIQUE:  CTA of the chest was performed with and without the administration of intravenous contrast. CTA of the abdomen and pelvis was performed with and without the administration of intravenous contrast. Multiplanar reformatted images are provided for review. MIP images are provided for review. Automated exposure control, iterative reconstruction, and/or weight based adjustment of the mA/kV was utilized to reduce the radiation dose to as low as reasonably achievable. 75mL of iohexol  (OMNIPAQUE ) 350 MG/ML injection was used. COMPARISON: Comparison to CT examination on 03/07/2024. CLINICAL HISTORY: Aortic atherosclerosis; sob. FINDINGS: VASCULATURE: AORTA: Mild atherosclerotic calcification within the thoracic aorta. Fusiform infrarenal abdominal aortic aneurysm is present measuring 4.2 x 4.9 cm, stable since prior examination, with circumferential mural thrombus within the aneurysm sac. The aneurysm terminates at the aortic bifurcation. Moderate superimposed aortic iliac atherosclerotic calcification. PULMONARY ARTERIES: No intraluminal filling defect identified to suggest acute or chronic pulmonary embolism. Adequate opacification of the pulmonary arterial tree. GREAT VESSELS OF AORTIC ARCH: The arch vasculature demonstrates conventional anatomic configuration. Less than 50% stenosis of the left subclavian artery at its origin. 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 Celiac axis and superior mesenteric artery are diminutive but otherwise widely patent. 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 RENAL ARTERIES: Right renal artery is diminutive in keeping with the atrophic right kidney. Greater than 50% stenosis of the mid left renal artery ( 146/10 ) ILIAC ARTERIES: Moderate superimposed aortic iliac  atherosclerotic calcification. CHEST: MEDIASTINUM: Mild coronary artery calcification. Calcification of the aortic valve leaflets. Global cardiac size within normal limits. No pericardial effusion. LUNGS AND PLEURA: Advanced emphysema. Bronchial wall thickening noted in keeping with airway inflammation. Since the prior examination, there is increasing bibasilar bronchial wall thickening in keeping with progressive airway inflammation, as well as extensive airway impaction and peripheral consolidation which may reflect changes of acute bronchopneumonia or aspiration. No pneumothorax or pleural effusion. THORACIC BONES AND SOFT TISSUES: No acute bone or soft tissue abnormality. Marked body wall wasting. ABDOMEN AND PELVIS: LIVER: The liver is unremarkable. GALLBLADDER AND BILE DUCTS: Gallbladder is unremarkable. No biliary ductal dilatation. SPLEEN: The spleen is unremarkable. PANCREAS: The pancreas is unremarkable. ADRENAL GLANDS: Bilateral adrenal glands demonstrate no acute abnormality. KIDNEYS, URETERS AND BLADDER: Marked asymmetric atrophy of the right  kidney. Nonobstructing 13 mm calculus noted within the lower pole of the right kidney. The left kidney is unremarkable. Bladder is unremarkable. GI AND BOWEL: The stomach, small bowel, and large bowel are otherwise unremarkable. There is relatively poor delineation of the visceral structures and bowel within the abdomen due to the early phase of contrast administration and marked wasting with paucity of intra-abdominal fat . REPRODUCTIVE: Reproductive organs are unremarkable. PERITONEUM AND RETROPERITONEUM: No ascites or free air. LYMPH NODES: No lymphadenopathy. ABDOMINAL BONES AND SOFT TISSUES: No acute abnormality of the bones. Marked body wall wasting and loss of retroperitoneal and intraabdominal fat . Osseous structures are age appropriate. No acute bone abnormality. No lytic or blastic bone lesion. IMPRESSION: 1. No evidence of pulmonary embolism. 2.  Progressive airway inflammation and extensive bibasilar airway impaction and peripheral consolidation in keeping with multifocal bronchopneumonia or aspiration. 3. Stable fusiform infrarenal abdominal aortic aneurysm measuring 4.2 x 4.9 cm with circumferential mural thrombus. No aortic dissection. 4. 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 5. Marked asymmetric atrophy of the right kidney 6. 50% stenosis of the mid left renal artery 7. Advanced emphysema. Marked body wall wasting and loss of retroperitoneal and intraabdominal fat. Electronically signed by: Dorethia Molt MD 03/12/2024 07:55 PM EDT RP Workstation: [id number]   DG Chest 2 View Result Date: 03/12/2024 CLINICAL DATA:  Question of sepsis to evaluate for abnormality. EXAM: CHEST - 2 VIEW COMPARISON:  03/07/2024 FINDINGS: Mild cardiac enlargement. No vascular congestion, edema, or consolidation. Emphysematous changes in the lungs. Peribronchial thickening with central interstitial pattern suggesting chronic bronchitis. No pleural effusion or pneumothorax. Mediastinal contours appear intact. Calcification of the aorta. Degenerative changes in the spine. IMPRESSION: Emphysematous and chronic bronchitic changes in the lungs. No focal consolidation. Mild cardiac enlargement Electronically Signed   By: Elsie Gravely M.D.   On: 03/12/2024 18:14   CT ABDOMEN PELVIS WO CONTRAST Result Date: 03/07/2024 CLINICAL DATA:  Abdomen pain weight loss EXAM: CT ABDOMEN AND PELVIS WITHOUT CONTRAST TECHNIQUE: Multidetector CT imaging of the abdomen and pelvis was performed following the standard protocol without IV contrast. RADIATION DOSE REDUCTION: This exam was performed according to the departmental dose-optimization program which includes automated exposure control, adjustment of the mA and/or kV according to patient size and/or use of iterative  reconstruction technique. COMPARISON:  CT 12/20/2022, 06/21/2022, 03/22/2022 FINDINGS: Lower chest: Lung bases demonstrate trace pleural effusion. Emphysema. Partial airspace opacities in the bases could reflect atelectasis or mild scarring. Hepatobiliary: Contracted gallbladder without calcified stone. No biliary dilatation Pancreas: Unremarkable. No pancreatic ductal dilatation or surrounding inflammatory changes. Spleen: Normal in size without focal abnormality. Adrenals/Urinary Tract: Stable adrenal glands. Atrophic right kidney. Stones in the lower pole of the right kidney measuring up to 10 mm. Cyst lower pole right kidney, no imaging follow-up is recommended. Partially exophytic hypodensity measuring 14 mm, best seen on coronal series 6, image 35, mid pole right kidney. The bladder is unremarkable Stomach/Bowel: The stomach is nonenlarged. There is no dilated small bowel. No acute bowel wall thickening. Vascular/Lymphatic: Advanced aortic atherosclerosis. Distal infrarenal abdominal aortic aneurysm measures 4.8 x 4.3 cm, previously 4.4 cm. No grossly enlarged lymph nodes allowing for paucity of fat Reproductive: Prostate slightly enlarged Other: Negative for pelvic effusion or free air Musculoskeletal: No acute or suspicious osseous abnormality IMPRESSION: 1. No CT evidence for acute intra-abdominal or pelvic abnormality. Limited exam secondary to absence of contrast and paucity of  intra-abdominal fat 2. Atrophic right kidney with nonobstructing stones in the lower pole. 3. 14 mm partially exophytic hypodensity mid pole right kidney, indeterminate in appearance. Consider correlation with nonemergent renal ultrasound. 4. Distal infrarenal abdominal aortic aneurysm measuring up to 4.8 cm, previously 4.4 cm. Recommend follow-up CT or MR as appropriate in 12 months and referral to or continued care with vascular specialist. (Ref.: J Vasc Surg. 2018; 67:2-77 and J Am Coll Radiol 2013;10(10):789-794.) Aortic  Atherosclerosis (ICD10-I70.0) and Emphysema (ICD10-J43.9). Electronically Signed   By: Luke Bun M.D.   On: 03/07/2024 19:07   DG Chest Port 1 View Result Date: 03/07/2024 CLINICAL DATA:  Cough EXAM: PORTABLE CHEST 1 VIEW COMPARISON:  Chest x-ray 02/24/2024. FINDINGS: Heart is enlarged. There are atherosclerotic calcifications of the aorta. Both lungs are clear. There is no pleural effusion or pneumothorax. The visualized skeletal structures are unremarkable. IMPRESSION: 1. No active disease. 2. Cardiomegaly. Electronically Signed   By: Greig Pique M.D.   On: 03/07/2024 18:14   DG Ribs Unilateral W/Chest Right Result Date: 02/24/2024 CLINICAL DATA:  Rib pain on the right. EXAM: RIGHT RIBS AND CHEST - 3+ VIEW COMPARISON:  02/14/2024 and CT chest 10/31/2021. FINDINGS: Trachea is midline. Heart size stable. Lungs are emphysematous. Streaky scarring in the lung bases. No pleural fluid. Dedicated views of the right ribs show no fracture. IMPRESSION: 1. No acute findings. 2. Emphysema with bibasilar scarring. Electronically Signed   By: Newell Eke M.D.   On: 02/24/2024 17:16     Discharge Exam: Vitals:   03/18/24 0437 03/18/24 0817  BP: 109/77 98/72  Pulse: 70 71  Resp:  18  Temp:  97.7 F (36.5 C)  SpO2: 99% 97%   Vitals:   03/18/24 0051 03/18/24 0052 03/18/24 0437 03/18/24 0817  BP: 111/75 111/75 109/77 98/72  Pulse: 79 77 70 71  Resp:    18  Temp: 97.8 F (36.6 C) 97.8 F (36.6 C)  97.7 F (36.5 C)  TempSrc:      SpO2: 97% 97% 99% 97%  Weight:      Height:        General: Pt is alert, awake, not in acute distress Cardiovascular: RRR, S1/S2 +, no rubs, no gallops Respiratory: CTA bilaterally, no wheezing, no rhonchi Abdominal: Soft, NT, ND, bowel sounds + Extremities: no edema, no cyanosis    The results of significant diagnostics from this hospitalization (including imaging, microbiology, ancillary and laboratory) are listed below for reference.      Microbiology: Recent Results (from the past 240 hours)  MRSA Next Gen by PCR, Nasal     Status: None   Collection Time: 03/12/24  3:53 PM   Specimen: Nasal Swab  Result Value Ref Range Status   MRSA by PCR Next Gen NOT DETECTED NOT DETECTED Final    Comment: (NOTE) The GeneXpert MRSA Assay (FDA approved for NASAL specimens only), is one component of a comprehensive MRSA colonization surveillance program. It is not intended to diagnose MRSA infection nor to guide or monitor treatment for MRSA infections. Test performance is not FDA approved in patients less than 31 years old. Performed at [facility], [address], [place], [place] [address]   Blood Culture (routine x 2)     Status: None   Collection Time: 03/12/24  4:50 PM   Specimen: BLOOD  Result Value Ref Range Status   Specimen Description BLOOD LEFT ANTECUBITAL  Final   Special Requests   Final    BOTTLES DRAWN  AEROBIC AND ANAEROBIC Blood Culture adequate volume   Culture   Final    NO GROWTH 5 DAYS Performed at [facility], [address], [place], [place] [address]    Report Status 03/17/2024 FINAL  Final  Blood Culture (routine x 2)     Status: None   Collection Time: 03/12/24  5:04 PM   Specimen: BLOOD  Result Value Ref Range Status   Specimen Description BLOOD LEFT ANTECUBITAL  Final   Special Requests   Final    BOTTLES DRAWN AEROBIC ONLY Blood Culture results may not be optimal due to an inadequate volume of blood received in culture bottles   Culture   Final    NO GROWTH 5 DAYS Performed at [facility], [address], [place], [place] [address]    Report Status 03/17/2024 FINAL  Final  Resp panel by RT-PCR (RSV, Flu A&B, Covid) Anterior Nasal Swab     Status: None   Collection Time: 03/13/24  4:24 AM   Specimen: Anterior Nasal Swab  Result Value Ref Range Status   SARS Coronavirus 2 by RT PCR NEGATIVE NEGATIVE Final   Influenza A by PCR NEGATIVE NEGATIVE Final   Influenza B by  PCR NEGATIVE NEGATIVE Final    Comment: (NOTE) The Xpert Xpress SARS-CoV-2/FLU/RSV plus assay is intended as an aid in the diagnosis of influenza from Nasopharyngeal swab specimens and should not be used as a sole basis for treatment. Nasal washings and aspirates are unacceptable for Xpert Xpress SARS-CoV-2/FLU/RSV testing.  Fact Sheet for Patients: BloggerCourse.com  Fact Sheet for Healthcare Providers: SeriousBroker.it  This test is not yet approved or cleared by the [place]  FDA and has been authorized for detection and/or diagnosis of SARS-CoV-2 by FDA under an Emergency Use Authorization (EUA). This EUA will remain in effect (meaning this test can be used) for the duration of the COVID-19 declaration under Section 564(b)(1) of the Act, 21 U.S.C. section 360bbb-3(b)(1), unless the authorization is terminated or revoked.     Resp Syncytial Virus by PCR NEGATIVE NEGATIVE Final    Comment: (NOTE) Fact Sheet for Patients: BloggerCourse.com  Fact Sheet for Healthcare Providers: SeriousBroker.it  This test is not yet approved or cleared by the [place]  FDA and has been authorized for detection and/or diagnosis of SARS-CoV-2 by FDA under an Emergency Use Authorization (EUA). This EUA will remain in effect (meaning this test can be used) for the duration of the COVID-19 declaration under Section 564(b)(1) of the Act, 21 U.S.C. section 360bbb-3(b)(1), unless the authorization is terminated or revoked.  Performed at [facility], [address] [place], [place] [address]   Respiratory (~20 pathogens) panel by PCR     Status: Abnormal   Collection Time: 03/13/24 12:05 PM   Specimen: Nasopharyngeal Swab; Respiratory  Result Value Ref Range Status   Adenovirus NOT DETECTED NOT DETECTED Final   Coronavirus 229E NOT DETECTED NOT DETECTED Final    Comment: (NOTE) The  Coronavirus on the Respiratory Panel, DOES NOT test for the novel  Coronavirus (2019 nCoV)    Coronavirus HKU1 NOT DETECTED NOT DETECTED Final   Coronavirus NL63 NOT DETECTED NOT DETECTED Final   Coronavirus OC43 NOT DETECTED NOT DETECTED Final   Metapneumovirus NOT DETECTED NOT DETECTED Final   Rhinovirus / Enterovirus DETECTED (A) NOT DETECTED Final   Influenza A NOT DETECTED NOT DETECTED Final   Influenza B NOT DETECTED NOT DETECTED Final   Parainfluenza Virus 1 NOT DETECTED NOT DETECTED  Final   Parainfluenza Virus 2 NOT DETECTED NOT DETECTED Final   Parainfluenza Virus 3 NOT DETECTED NOT DETECTED Final   Parainfluenza Virus 4 NOT DETECTED NOT DETECTED Final   Respiratory Syncytial Virus NOT DETECTED NOT DETECTED Final   Bordetella pertussis NOT DETECTED NOT DETECTED Final   Bordetella Parapertussis NOT DETECTED NOT DETECTED Final   Chlamydophila pneumoniae NOT DETECTED NOT DETECTED Final   Mycoplasma pneumoniae NOT DETECTED NOT DETECTED Final    Comment: Performed at [facility], [address], [place], [place] [address]     Labs: BNP (last 3 results) Recent Labs    03/12/24 1711  BNP 409.4*   Basic Metabolic Panel: Recent Labs  Lab 03/12/24 1710 03/13/24 0424  NA 135 134*  K 5.4* 4.9  CL 104 107  CO2 19* 20*  GLUCOSE 170* 183*  BUN 47* 42*  CREATININE 1.71* 1.53*  CALCIUM  9.0 8.2*   Liver Function Tests: Recent Labs  Lab 03/12/24 1710  AST 16  ALT 14  ALKPHOS 88  BILITOT 0.5  PROT 6.7  ALBUMIN 3.5   Recent Labs  Lab 03/12/24 1710  LIPASE 20   No results for input(s): AMMONIA in the last 168 hours. CBC: Recent Labs  Lab 03/12/24 1710  WBC 7.1  NEUTROABS 5.7  HGB 13.3  HCT 39.0  MCV 97.0  PLT 144*   Cardiac Enzymes: No results for input(s): CKTOTAL, CKMB, CKMBINDEX, TROPONINI in the last 168 hours. BNP: Invalid input(s): POCBNP CBG: Recent Labs  Lab 03/17/24 0755 03/17/24 1200 03/17/24 1735 03/17/24 2026  03/18/24 0853  GLUCAP 163* 90 133* 123* 127*   D-Dimer No results for input(s): DDIMER in the last 72 hours. Hgb A1c No results for input(s): HGBA1C in the last 72 hours.  Lipid Profile No results for input(s): CHOL, HDL, LDLCALC, TRIG, CHOLHDL, LDLDIRECT in the last 72 hours. Thyroid  function studies No results for input(s): TSH, T4TOTAL, T3FREE, THYROIDAB in the last 72 hours.  Invalid input(s): FREET3 Anemia work up No results for input(s): VITAMINB12, FOLATE, FERRITIN, TIBC, IRON, RETICCTPCT in the last 72 hours. Urinalysis    Component Value Date/Time   COLORURINE YELLOW 03/12/2024 2002   APPEARANCEUR CLEAR 03/12/2024 2002   LABSPEC 1.012 03/12/2024 2002   PHURINE 5.0 03/12/2024 2002   GLUCOSEU NEGATIVE 03/12/2024 2002   GLUCOSEU NEGATIVE 02/11/2012 0931   HGBUR NEGATIVE 03/12/2024 2002   BILIRUBINUR NEGATIVE 03/12/2024 2002   KETONESUR NEGATIVE 03/12/2024 2002   PROTEINUR NEGATIVE 03/12/2024 2002   UROBILINOGEN 0.2 06/20/2022 1043   NITRITE NEGATIVE 03/12/2024 2002   LEUKOCYTESUR NEGATIVE 03/12/2024 2002   Sepsis Labs Recent Labs  Lab 03/12/24 1710  WBC 7.1   Microbiology Recent Results (from the past 240 hours)  MRSA Next Gen by PCR, Nasal     Status: None   Collection Time: 03/12/24  3:53 PM   Specimen: Nasal Swab  Result Value Ref Range Status   MRSA by PCR Next Gen NOT DETECTED NOT DETECTED Final    Comment: (NOTE) The GeneXpert MRSA Assay (FDA approved for NASAL specimens only), is one component of a comprehensive MRSA colonization surveillance program. It is not intended to diagnose MRSA infection nor to guide or monitor treatment for MRSA infections. Test performance is not FDA approved in patients less than 39 years old. Performed at [facility], [address], [place], [place] [address]   Blood Culture (routine x 2)     Status: None   Collection Time: 03/12/24  4:50  PM   Specimen: BLOOD  Result  Value Ref Range Status   Specimen Description BLOOD LEFT ANTECUBITAL  Final   Special Requests   Final    BOTTLES DRAWN AEROBIC AND ANAEROBIC Blood Culture adequate volume   Culture   Final    NO GROWTH 5 DAYS Performed at [facility], [address] [place], [place] [address]    Report Status 03/17/2024 FINAL  Final  Blood Culture (routine x 2)     Status: None   Collection Time: 03/12/24  5:04 PM   Specimen: BLOOD  Result Value Ref Range Status   Specimen Description BLOOD LEFT ANTECUBITAL  Final   Special Requests   Final    BOTTLES DRAWN AEROBIC ONLY Blood Culture results may not be optimal due to an inadequate volume of blood received in culture bottles   Culture   Final    NO GROWTH 5 DAYS Performed at [facility], [address], [place], [place] [address]    Report Status 03/17/2024 FINAL  Final  Resp panel by RT-PCR (RSV, Flu A&B, Covid) Anterior Nasal Swab     Status: None   Collection Time: 03/13/24  4:24 AM   Specimen: Anterior Nasal Swab  Result Value Ref Range Status   SARS Coronavirus 2 by RT PCR NEGATIVE NEGATIVE Final   Influenza A by PCR NEGATIVE NEGATIVE Final   Influenza B by PCR NEGATIVE NEGATIVE Final    Comment: (NOTE) The Xpert Xpress SARS-CoV-2/FLU/RSV plus assay is intended as an aid in the diagnosis of influenza from Nasopharyngeal swab specimens and should not be used as a sole basis for treatment. Nasal washings and aspirates are unacceptable for Xpert Xpress SARS-CoV-2/FLU/RSV testing.  Fact Sheet for Patients: BloggerCourse.com  Fact Sheet for Healthcare Providers: SeriousBroker.it  This test is not yet approved or cleared by the [place]  FDA and has been authorized for detection and/or diagnosis of SARS-CoV-2 by FDA under an Emergency Use Authorization (EUA). This EUA will remain in effect (meaning this test can be used) for the duration of the COVID-19 declaration under  Section 564(b)(1) of the Act, 21 U.S.C. section 360bbb-3(b)(1), unless the authorization is terminated or revoked.     Resp Syncytial Virus by PCR NEGATIVE NEGATIVE Final    Comment: (NOTE) Fact Sheet for Patients: BloggerCourse.com  Fact Sheet for Healthcare Providers: SeriousBroker.it  This test is not yet approved or cleared by the [place]  FDA and has been authorized for detection and/or diagnosis of SARS-CoV-2 by FDA under an Emergency Use Authorization (EUA). This EUA will remain in effect (meaning this test can be used) for the duration of the COVID-19 declaration under Section 564(b)(1) of the Act, 21 U.S.C. section 360bbb-3(b)(1), unless the authorization is terminated or revoked.  Performed at [facility], [address], [place], [place] [address]   Respiratory (~20 pathogens) panel by PCR     Status: Abnormal   Collection Time: 03/13/24 12:05 PM   Specimen: Nasopharyngeal Swab; Respiratory  Result Value Ref Range Status   Adenovirus NOT DETECTED NOT DETECTED Final   Coronavirus 229E NOT DETECTED NOT DETECTED Final    Comment: (NOTE) The Coronavirus on the Respiratory Panel, DOES NOT test for the novel  Coronavirus (2019 nCoV)    Coronavirus HKU1 NOT DETECTED NOT DETECTED Final   Coronavirus NL63 NOT DETECTED NOT DETECTED Final   Coronavirus OC43 NOT DETECTED NOT DETECTED Final   Metapneumovirus NOT DETECTED NOT DETECTED Final   Rhinovirus / Enterovirus  DETECTED (A) NOT DETECTED Final   Influenza A NOT DETECTED NOT DETECTED Final   Influenza B NOT DETECTED NOT DETECTED Final   Parainfluenza Virus 1 NOT DETECTED NOT DETECTED Final   Parainfluenza Virus 2 NOT DETECTED NOT DETECTED Final   Parainfluenza Virus 3 NOT DETECTED NOT DETECTED Final   Parainfluenza Virus 4 NOT DETECTED NOT DETECTED Final   Respiratory Syncytial Virus NOT DETECTED NOT DETECTED Final   Bordetella pertussis NOT DETECTED NOT  DETECTED Final   Bordetella Parapertussis NOT DETECTED NOT DETECTED Final   Chlamydophila pneumoniae NOT DETECTED NOT DETECTED Final   Mycoplasma pneumoniae NOT DETECTED NOT DETECTED Final    Comment: Performed at [facility], [address] [place], [place] [address]    FURTHER DISCHARGE INSTRUCTIONS:   Get Medicines reviewed and adjusted: Please take all your medications with you for your next visit with your Primary MD   Laboratory/radiological data: Please request your Primary MD to go over all hospital tests and procedure/radiological results at the follow up, please ask your Primary MD to get all Hospital records sent to his/her office.   In some cases, they will be blood work, cultures and biopsy results pending at the time of your discharge. Please request that your primary care M.D. goes through all the records of your hospital data and follows up on these results.   Also Note the following: If you experience worsening of your admission symptoms, develop shortness of breath, life threatening emergency, suicidal or homicidal thoughts you must seek medical attention immediately by calling 911 or calling your MD immediately  if symptoms less severe.   You must read complete instructions/literature along with all the possible adverse reactions/side effects for all the Medicines you take and that have been prescribed to you. Take any new Medicines after you have completely understood and accpet all the possible adverse reactions/side effects.    patient was instructed, not to drive, operate heavy machinery, perform activities at heights, swimming or participation in water activities or provide baby-sitting services while on Pain, Sleep and Anxiety Medications; until their outpatient Physician has advised to do so again. Also recommended to not to take more than prescribed Pain, Sleep and Anxiety Medications.  It is not advisable to combine anxiety, sleep and pain medications without  talking with your primary care provider.     Wear Seat belts while driving.   Please note: You were cared for by a hospitalist during your hospital stay. Once you are discharged, your primary care physician will handle any further medical issues. Please note that NO REFILLS for any discharge medications will be authorized once you are discharged, as it is imperative that you return to your primary care physician (or establish a relationship with a primary care physician if you do not have one) for your post hospital discharge needs so that they can reassess your need for medications and monitor your lab values  Time coordinating discharge: Over 30 minutes  SIGNED:   Fredia Skeeter, MD  [facility] 03/18/2024, 10:17 AM *Please note that this is a verbal dictation therefore any spelling or grammatical errors are due to the Dragon Medical One system interpretation. If 7PM-7AM, please contact night-coverage [email or web address]

## 2024-04-06 ENCOUNTER — Encounter (HOSPITAL_COMMUNITY): Payer: Self-pay

## 2024-04-06 ENCOUNTER — Emergency Department (HOSPITAL_COMMUNITY)

## 2024-04-06 ENCOUNTER — Other Ambulatory Visit: Payer: Self-pay

## 2024-04-06 ENCOUNTER — Emergency Department (HOSPITAL_COMMUNITY)
Admission: EM | Admit: 2024-04-06 | Discharge: 2024-04-06 | Discharge status: Against Medical Advice | Attending: Emergency Medicine | Admitting: Emergency Medicine

## 2024-04-06 DIAGNOSIS — M79605 Pain in left leg: Secondary | ICD-10-CM | POA: Insufficient documentation

## 2024-04-06 DIAGNOSIS — M79661 Pain in right lower leg: Secondary | ICD-10-CM

## 2024-04-06 DIAGNOSIS — M79604 Pain in right leg: Secondary | ICD-10-CM | POA: Diagnosis not present

## 2024-04-06 DIAGNOSIS — R5383 Other fatigue: Secondary | ICD-10-CM | POA: Diagnosis present

## 2024-04-06 LAB — CBC WITH DIFFERENTIAL/PLATELET
Abs Immature Granulocytes: 0.02 K/uL (ref 0.00–0.07)
Basophils Absolute: 0 K/uL (ref 0.0–0.1)
Basophils Relative: 1 %
Eosinophils Absolute: 0.1 K/uL (ref 0.0–0.5)
Eosinophils Relative: 2 %
HCT: 35.9 % — ABNORMAL LOW (ref 39.0–52.0)
Hemoglobin: 12.2 g/dL — ABNORMAL LOW (ref 13.0–17.0)
Immature Granulocytes: 0 %
Lymphocytes Relative: 18 %
Lymphs Abs: 1.1 K/uL (ref 0.7–4.0)
MCH: 32.9 pg (ref 26.0–34.0)
MCHC: 34 g/dL (ref 30.0–36.0)
MCV: 96.8 fL (ref 80.0–100.0)
Monocytes Absolute: 0.6 K/uL (ref 0.1–1.0)
Monocytes Relative: 9 %
Neutro Abs: 4.2 K/uL (ref 1.7–7.7)
Neutrophils Relative %: 70 %
Platelets: 131 K/uL — ABNORMAL LOW (ref 150–400)
RBC: 3.71 MIL/uL — ABNORMAL LOW (ref 4.22–5.81)
RDW: 13.6 % (ref 11.5–15.5)
WBC: 6 K/uL (ref 4.0–10.5)
nRBC: 0 % (ref 0.0–0.2)

## 2024-04-06 LAB — COMPREHENSIVE METABOLIC PANEL WITH GFR
ALT: 17 U/L (ref 0–44)
AST: 21 U/L (ref 15–41)
Albumin: 3.1 g/dL — ABNORMAL LOW (ref 3.5–5.0)
Alkaline Phosphatase: 91 U/L (ref 38–126)
Anion gap: 8 (ref 5–15)
BUN: 36 mg/dL — ABNORMAL HIGH (ref 8–23)
CO2: 18 mmol/L — ABNORMAL LOW (ref 22–32)
Calcium: 8.7 mg/dL — ABNORMAL LOW (ref 8.9–10.3)
Chloride: 115 mmol/L — ABNORMAL HIGH (ref 98–111)
Creatinine, Ser: 1.59 mg/dL — ABNORMAL HIGH (ref 0.61–1.24)
GFR, Estimated: 42 mL/min — ABNORMAL LOW (ref >60)
Glucose, Bld: 200 mg/dL — ABNORMAL HIGH (ref 70–99)
Potassium: 4.5 mmol/L (ref 3.5–5.1)
Sodium: 141 mmol/L (ref 135–145)
Total Bilirubin: 0.9 mg/dL (ref 0.0–1.2)
Total Protein: 6.1 g/dL — ABNORMAL LOW (ref 6.5–8.1)

## 2024-04-06 MED ORDER — SODIUM CHLORIDE 0.9 % IV BOLUS: 1000.0000 mL | Freq: Once | INTRAVENOUS | Status: DC

## 2024-04-06 NOTE — ED Provider Triage Note (Cosign Needed)
 Emergency Medicine Provider Triage Evaluation Note  Ramar Nobrega. , a 89 y.o. male  was evaluated in triage.  Pt complains of the weakness and pain in his legs bilaterally for the past week.  He also reports that pain goes up into his scrotum and he is having more difficulty with urination.  He reports that his lower extremities have been more swollen than normal.  Denies any chest pain or shortness of breath.  Denies any slurred speech, facial droop, arm weakness or numbness.  No numbness in his lower extremities.  Review of Systems  Positive: As above Negative: As above  Physical Exam  BP 119/86   Pulse 86   Temp 98.7 F (37.1 C)   Resp 16   SpO2 100%  Gen:   Awake, no distress  Resp:  Normal effort  MSK:   Moves extremities without difficulty  Other:  5/5 strength in the bilateral lower extremities.  Intact sensation to the bilateral lower and upper extremities.  No slurred speech, facial droop.  5/5 strength in the bilateral upper extremities.  Medical Decision Making  Medically screening exam initiated at 3:19 PM.  Appropriate orders placed.  [name] was informed that the remainder of the evaluation will be completed by another provider, this initial triage assessment does not replace that evaluation, and the importance of remaining in the ED until their evaluation is complete.     Veta Palma, PA-C 04/06/24 1519

## 2024-04-06 NOTE — ED Notes (Signed)
 Pt refusing IV and bloodwork. Emil, MD notified.

## 2024-04-06 NOTE — ED Notes (Signed)
 Pt leaving AMA. Pt aware of the risk w/ leaving AMA, and document was signed. Emil, MD notified.

## 2024-04-06 NOTE — ED Provider Notes (Signed)
 [place] EMERGENCY DEPARTMENT AT [facility] Provider Note   CSN: [id number] Arrival date & time: 04/06/24  1411     Patient presents with: Weakness and Testicle Pain   Vincent Baxter. is a 89 y.o. male.   89 yo M with a chief complaint of bilateral leg pain.  This is worse on the right than the left.  Mostly on the inside of the leg.  Worse when he gets up and tries to walk.  Has been increasingly fatigued and feels like he has trouble standing and walking for at least the past 6 months.  Has got worse over the past few days.  Had to the point where he felt he could not even stand.  Was also having some trouble urinating.  Came to the ED for evaluation.   Weakness Testicle Pain       Prior to Admission medications   Medication Sig Start Date End Date Taking? Authorizing Provider  acetaminophen  (TYLENOL ) 500 MG tablet Take 1 tablet (500 mg total) by mouth every 6 (six) hours as needed for moderate pain (pain score 4-6). 03/07/24   Doretha Folks, MD  amLODipine  (NORVASC ) 5 MG tablet Take 1 tablet (5 mg total) by mouth daily. 02/24/24   Cleotilde Rogue, MD  diclofenac  Sodium (VOLTAREN ) 1 % GEL Apply 4 g topically 4 (four) times daily. Patient taking differently: Apply 4 g topically daily as needed (FOR PAIN). 01/16/24   White, Elizabeth A, PA-C  HYDROcodone -acetaminophen  (NORCO/VICODIN) 5-325 MG tablet Take 1 tablet by mouth every 6 (six) hours as needed for moderate pain (pain score 4-6). Patient not taking: Reported on 03/13/2024 07/29/23   Zammit, Joseph, MD  lidocaine  (LIDODERM ) 5 % Place 1 patch onto the skin daily. Remove & Discard patch within 12 hours or as directed by MD Patient taking differently: Place 1 patch onto the skin daily as needed (for pain). 01/16/24   White, Elizabeth A, PA-C  tamsulosin  (FLOMAX ) 0.4 MG CAPS capsule Take 1 capsule (0.4 mg total) by mouth daily. 07/29/23   Suzette Pac, MD    Allergies: Patient has no known allergies.    Review of  Systems  Genitourinary:  Positive for testicular pain.  Neurological:  Positive for weakness.    Updated Vital Signs BP (!) 129/93   Pulse 80   Temp 98.7 F (37.1 C)   Resp 15   SpO2 100%   Physical Exam Vitals and nursing note reviewed.  Constitutional:      Appearance: He is well-developed.  HENT:     Head: Normocephalic and atraumatic.  Eyes:     Pupils: Pupils are equal, round, and reactive to light.  Neck:     Vascular: No JVD.  Cardiovascular:     Rate and Rhythm: Normal rate and regular rhythm.     Heart sounds: No murmur heard.    No friction rub. No gallop.  Pulmonary:     Effort: No respiratory distress.     Breath sounds: No wheezing.  Abdominal:     General: There is no distension.     Tenderness: There is no abdominal tenderness. There is no guarding or rebound.  Musculoskeletal:        General: Normal range of motion.     Cervical back: Normal range of motion and neck supple.     Comments: Legs are cold and appear to be poorly perfused but has a very good posterior tibialis Doppler signal bilaterally.  Some stiffness with movement of  the feet bilaterally.  Skin:    Coloration: Skin is not pale.     Findings: No rash.  Neurological:     Mental Status: He is alert and oriented to person, place, and time.  Psychiatric:        Behavior: Behavior normal.     (all labs ordered are listed, but only abnormal results are displayed) Labs Reviewed  CBC WITH DIFFERENTIAL/PLATELET - Abnormal; Notable for the following components:      Result Value   RBC 3.71 (*)    Hemoglobin 12.2 (*)    HCT 35.9 (*)    Platelets 131 (*)    All other components within normal limits  COMPREHENSIVE METABOLIC PANEL WITH GFR - Abnormal; Notable for the following components:   Chloride 115 (*)    CO2 18 (*)    Glucose, Bld 200 (*)    BUN 36 (*)    Creatinine, Ser 1.59 (*)    Calcium  8.7 (*)    Total Protein 6.1 (*)    Albumin 3.1 (*)    GFR, Estimated 42 (*)    All other  components within normal limits  URINALYSIS, ROUTINE W REFLEX MICROSCOPIC  BETA-HYDROXYBUTYRIC ACID  I-STAT CG4 LACTIC ACID, ED    EKG: None  Radiology: US  SCROTUM W/DOPPLER Result Date: 04/06/2024 CLINICAL DATA:  testicular pain EXAM: SCROTAL ULTRASOUND DOPPLER ULTRASOUND OF THE TESTICLES TECHNIQUE: Complete ultrasound examination of the testicles, epididymis, and other scrotal structures was performed. Color and spectral Doppler ultrasound were also utilized to evaluate blood flow to the testicles. COMPARISON:  None Available. FINDINGS: Right testicle Measurements: 2.5 x 2 x 2.1 cm. No mass or microlithiasis visualized. Left testicle Measurements: 2.6 x 1.8 x 2.1 cm. No mass or microlithiasis visualized. Right epididymis: Normal in size. Tiny epididymal cyst measuring 2 mm. Left epididymis: Normal in size. Tiny epididymal cyst measuring 2 mm. Hydrocele:  Moderate volume bilateral hydroceles. Varicocele:  None visualized. Pulsed Doppler interrogation of both testes demonstrates normal low resistance arterial and venous waveforms bilaterally. IMPRESSION: 1. No acute sonographic abnormality within the testicles; more specifically, no testicular mass, findings of epididymo-orchitis, or changes of testicular torsion, at this time. 2. Moderate volume bilateral hydroceles. Electronically Signed   By: Rogelia Myers M.D.   On: 04/06/2024 16:37     Procedures   Medications Ordered in the ED  sodium chloride  0.9 % bolus 1,000 mL (1,000 mLs Intravenous Patient Refused/Not Given 04/06/24 1937)                                    Medical Decision Making Amount and/or Complexity of Data Reviewed Labs: ordered.   38 yoM with a chief complaint of fatigue and leg pain.  I actually saw this patient personally prior to his last admission.  At that time was found to have multifocal pneumonia.  He is just been at the hospital for a couple weeks.  At the time I saw him he was complaining of leg pain as  well.  While in the hospital he was seen by vascular surgery who did not feel he benefit from an acute vascular intervention.  Exam seems similar for me.  No obvious acute arterial occlusion.  He does have a metabolic acidosis without anion gap similar to prior but may be slightly more profound.  I think likely dehydrated.  Will give a bolus of IV fluids.  With urinary symptoms will obtain  a UA.  His postvoid residual was 70.  Not obviously retaining.  I discussed likely follow-up with the patient.  Awaiting further studies.  I was notified by nursing that the patient had told them that it was time for him to go.  He then left without me discussing any further questions.  7:59 PM:  I have discussed the diagnosis/risks/treatment options with the patient.  Evaluation and diagnostic testing in the emergency department does not suggest an emergent condition requiring admission or immediate intervention beyond what has been performed at this time.  They will follow up with PCP. We also discussed returning to the ED immediately if new or worsening sx occur. We discussed the sx which are most concerning (e.g., sudden worsening pain, fever, inability to tolerate by mouth) that necessitate immediate return. Medications administered to the patient during their visit and any new prescriptions provided to the patient are listed below.  Medications given during this visit Medications  sodium chloride  0.9 % bolus 1,000 mL (1,000 mLs Intravenous Patient Refused/Not Given 04/06/24 1937)     The patient appears reasonably screen and/or stabilized for discharge and I doubt any other medical condition or other [facility] requiring further screening, evaluation, or treatment in the ED at this time prior to discharge.       Final diagnoses:  None    ED Discharge Orders     None          Emil Share, [place] 04/06/24 1959

## 2024-04-06 NOTE — ED Triage Notes (Signed)
 Patient states he is weak and can't get out of bed, doesn't want to get out of bed because he hurts so bad. Patient states he hurts from his toes to his scrotum and he hasn't been better since he was discharged.

## 2024-04-06 NOTE — ED Triage Notes (Signed)
 Pt here for weakness in legs and c/o inability to walk. Pt states at registration he has been here 5 times today. Pt will not let nurse or registration talk so unable to ask additional questions.

## 2024-04-10 ENCOUNTER — Ambulatory Visit: Admitting: Vascular Surgery

## 2024-04-10 ENCOUNTER — Other Ambulatory Visit (HOSPITAL_COMMUNITY)

## 2024-04-10 ENCOUNTER — Encounter (HOSPITAL_COMMUNITY)

## 2024-05-19 ENCOUNTER — Other Ambulatory Visit: Payer: Self-pay

## 2024-05-19 DIAGNOSIS — I739 Peripheral vascular disease, unspecified: Secondary | ICD-10-CM

## 2024-05-19 DIAGNOSIS — I7143 Infrarenal abdominal aortic aneurysm, without rupture: Secondary | ICD-10-CM

## 2024-05-31 ENCOUNTER — Other Ambulatory Visit: Payer: Self-pay

## 2024-05-31 ENCOUNTER — Emergency Department (HOSPITAL_COMMUNITY)
Admission: EM | Admit: 2024-05-31 | Discharge: 2024-05-31 | Disposition: A | Discharge status: Home / Self Care | Attending: Emergency Medicine | Admitting: Emergency Medicine

## 2024-05-31 DIAGNOSIS — Z79899 Other long term (current) drug therapy: Secondary | ICD-10-CM | POA: Insufficient documentation

## 2024-05-31 DIAGNOSIS — R630 Anorexia: Secondary | ICD-10-CM | POA: Insufficient documentation

## 2024-05-31 DIAGNOSIS — I10 Essential (primary) hypertension: Secondary | ICD-10-CM | POA: Diagnosis not present

## 2024-05-31 DIAGNOSIS — R059 Cough, unspecified: Secondary | ICD-10-CM | POA: Diagnosis present

## 2024-05-31 DIAGNOSIS — R531 Weakness: Secondary | ICD-10-CM | POA: Diagnosis not present

## 2024-05-31 DIAGNOSIS — M7989 Other specified soft tissue disorders: Secondary | ICD-10-CM | POA: Diagnosis not present

## 2024-05-31 LAB — COMPREHENSIVE METABOLIC PANEL WITH GFR
ALT: 29 U/L (ref 0–44)
AST: 32 U/L (ref 15–41)
Albumin: 4 g/dL (ref 3.5–5.0)
Alkaline Phosphatase: 124 U/L (ref 38–126)
Anion gap: 13 (ref 5–15)
BUN: 31 mg/dL — ABNORMAL HIGH (ref 8–23)
CO2: 20 mmol/L — ABNORMAL LOW (ref 22–32)
Calcium: 9.3 mg/dL (ref 8.9–10.3)
Chloride: 111 mmol/L (ref 98–111)
Creatinine, Ser: 1.68 mg/dL — ABNORMAL HIGH (ref 0.61–1.24)
GFR, Estimated: 39 mL/min — ABNORMAL LOW (ref >60)
Glucose, Bld: 167 mg/dL — ABNORMAL HIGH (ref 70–99)
Potassium: 4.5 mmol/L (ref 3.5–5.1)
Sodium: 144 mmol/L (ref 135–145)
Total Bilirubin: 0.8 mg/dL (ref 0.0–1.2)
Total Protein: 6.5 g/dL (ref 6.5–8.1)

## 2024-05-31 LAB — URINALYSIS, ROUTINE W REFLEX MICROSCOPIC
Bacteria, UA: NONE SEEN
Bilirubin Urine: NEGATIVE
Glucose, UA: NEGATIVE mg/dL
Ketones, ur: NEGATIVE mg/dL
Leukocytes,Ua: NEGATIVE
Nitrite: NEGATIVE
Protein, ur: 30 mg/dL — AB
Specific Gravity, Urine: 1.013 (ref 1.005–1.030)
pH: 5 (ref 5.0–8.0)

## 2024-05-31 LAB — CBC
HCT: 41.3 % (ref 39.0–52.0)
Hemoglobin: 13.6 g/dL (ref 13.0–17.0)
MCH: 32.7 pg (ref 26.0–34.0)
MCHC: 32.9 g/dL (ref 30.0–36.0)
MCV: 99.3 fL (ref 80.0–100.0)
Platelets: 112 K/uL — ABNORMAL LOW (ref 150–400)
RBC: 4.16 MIL/uL — ABNORMAL LOW (ref 4.22–5.81)
RDW: 14.6 % (ref 11.5–15.5)
WBC: 7 K/uL (ref 4.0–10.5)
nRBC: 0 % (ref 0.0–0.2)

## 2024-05-31 LAB — CBG MONITORING, ED: Glucose-Capillary: 170 mg/dL — ABNORMAL HIGH (ref 70–99)

## 2024-05-31 MED ORDER — GUAIFENESIN 100 MG/5ML PO LIQD
5.0000 mL | Freq: Once | ORAL | Status: AC
  Administered 2024-05-31: 5 mL via ORAL
  Filled 2024-05-31: qty 10

## 2024-05-31 NOTE — Discharge Instructions (Signed)
 As discussed, your evaluation today has been largely reassuring.  But, it is important that you monitor your condition carefully, and do not hesitate to return to the ED if you develop new, or concerning changes in your condition. ? ?Otherwise, please follow-up with your physician for appropriate ongoing care. ? ?

## 2024-05-31 NOTE — ED Triage Notes (Signed)
 Pt arrives to triage via wheelchair with complaints of poor appetite, general weakness, and leg swelling that began 2 days ago. Pt states I'm here to find out why I'm weak. Pt reports that he drove himself here independently.

## 2024-05-31 NOTE — ED Provider Notes (Signed)
 [place] EMERGENCY DEPARTMENT AT [facility] Provider Note   CSN: [id number] Arrival date & time: 05/31/24  1441     Patient presents with: Weakness   Vincent Baxter. is a 89 y.o. male.   HPI Pt arrives to triage via wheelchair with complaints of poor appetite, general weakness, and leg swelling that began 2 days ago. Pt states I'm here to find out why I'm weak. Pt reports that he drove himself here independently.  Patient notes that he has been seen, evaluated several times, continues to have weakness, chills, requests evaluation. No focal pain or focal weakness.    Prior to Admission medications  Medication Sig Start Date End Date Taking? Authorizing Provider  acetaminophen  (TYLENOL ) 500 MG tablet Take 1 tablet (500 mg total) by mouth every 6 (six) hours as needed for moderate pain (pain score 4-6). 03/07/24   Vincent Folks, MD  amLODipine  (NORVASC ) 5 MG tablet Take 1 tablet (5 mg total) by mouth daily. 02/24/24   Vincent Rogue, MD  diclofenac  Sodium (VOLTAREN ) 1 % GEL Apply 4 g topically 4 (four) times daily. Patient taking differently: Apply 4 g topically daily as needed (FOR PAIN). 01/16/24   Baxter, Vincent A, PA-C  HYDROcodone -acetaminophen  (NORCO/VICODIN) 5-325 MG tablet Take 1 tablet by mouth every 6 (six) hours as needed for moderate pain (pain score 4-6). Patient not taking: Reported on 03/13/2024 07/29/23   Baxter, Joseph, MD  lidocaine  (LIDODERM ) 5 % Place 1 patch onto the skin daily. Remove & Discard patch within 12 hours or as directed by MD Patient taking differently: Place 1 patch onto the skin daily as needed (for pain). 01/16/24   Baxter, Vincent A, PA-C  tamsulosin  (FLOMAX ) 0.4 MG CAPS capsule Take 1 capsule (0.4 mg total) by mouth daily. 07/29/23   Vincent Pac, MD    Allergies: Patient has no known allergies.    Review of Systems  Updated Vital Signs BP (!) 140/100 (BP Location: Right Arm)   Pulse 85   Temp 98 F (36.7 C) (Oral)    Resp 17 Comment: Simultaneous filing. User may not have seen previous data.  SpO2 98%   Physical Exam Vitals and nursing note reviewed.  Constitutional:      General: He is not in acute distress.    Appearance: He is well-developed.  HENT:     Head: Normocephalic and atraumatic.  Eyes:     Conjunctiva/sclera: Conjunctivae normal.  Cardiovascular:     Rate and Rhythm: Normal rate and regular rhythm.  Pulmonary:     Effort: Pulmonary effort is normal. No respiratory distress.     Breath sounds: No stridor.  Abdominal:     General: There is no distension.  Skin:    General: Skin is warm and dry.  Neurological:     Mental Status: He is alert and oriented to person, place, and time.     (all labs ordered are listed, but only abnormal results are displayed) Labs Reviewed  COMPREHENSIVE METABOLIC PANEL WITH GFR - Abnormal; Notable for the following components:      Result Value   CO2 20 (*)    Glucose, Bld 167 (*)    BUN 31 (*)    Creatinine, Ser 1.68 (*)    GFR, Estimated 39 (*)    All other components within normal limits  CBC - Abnormal; Notable for the following components:   RBC 4.16 (*)    Platelets 112 (*)    All other components within normal limits  URINALYSIS, ROUTINE W REFLEX MICROSCOPIC - Abnormal; Notable for the following components:   Hgb urine dipstick SMALL (*)    Protein, ur 30 (*)    All other components within normal limits  CBG MONITORING, ED - Abnormal; Notable for the following components:   Glucose-Capillary 170 (*)    All other components within normal limits    EKG: None  Radiology: No results found.   Procedures   Medications Ordered in the ED  guaiFENesin  (ROBITUSSIN) 100 MG/5ML liquid 5 mL (5 mLs Oral Given 05/31/24 1712)                                    Medical Decision Making Adult male complains of generalized weakness.  He has been seen, evaluated several times, 6 prior ED visits over the past 6 months, is awake, alert,  hemodynamically unremarkable aside for mild hypertension.  However, given his age, medical problems, broad differential including infection, bacteremia, sepsis, electrolyte abnormalities, all considered. Cardiac 95 sinus normal pulse ox 100% room air normal  Amount and/or Complexity of Data Reviewed External Data Reviewed: notes. Labs: ordered. Decision-making details documented in ED Course. Radiology: ordered and independent interpretation performed. Decision-making details documented in ED Course. ECG/medicine tests: ordered and independent interpretation performed. Decision-making details documented in ED Course.  Risk OTC drugs. Prescription drug management. Decision regarding hospitalization. Diagnosis or treatment significantly limited by social determinants of health.   6:49 PM On repeat exam patient in no distress, awake, alert, vitals unremarkable labs reassuring, no pneumonia, bacteremia, sepsis, electrolyte abnormalities.  Ongoing cough patient will have Robitussin, will follow-up with primary care.     Final diagnoses:  Weakness  Cough, unspecified type    ED Discharge Orders     None          Vincent Charleston, MD 05/31/24 [phone]

## 2024-06-18 ENCOUNTER — Other Ambulatory Visit: Payer: Self-pay

## 2024-06-18 ENCOUNTER — Inpatient Hospital Stay (HOSPITAL_COMMUNITY)
Admission: EM | Admit: 2024-06-18 | Discharge: 2024-06-29 | DRG: 628 | Disposition: A | Discharge status: Home / Self Care | Attending: Internal Medicine | Admitting: Internal Medicine

## 2024-06-18 ENCOUNTER — Encounter (HOSPITAL_COMMUNITY): Payer: Self-pay

## 2024-06-18 ENCOUNTER — Emergency Department (HOSPITAL_COMMUNITY)

## 2024-06-18 DIAGNOSIS — I714 Abdominal aortic aneurysm, without rupture, unspecified: Secondary | ICD-10-CM | POA: Diagnosis present

## 2024-06-18 DIAGNOSIS — L97529 Non-pressure chronic ulcer of other part of left foot with unspecified severity: Principal | ICD-10-CM | POA: Diagnosis present

## 2024-06-18 DIAGNOSIS — I10 Essential (primary) hypertension: Secondary | ICD-10-CM | POA: Diagnosis not present

## 2024-06-18 DIAGNOSIS — Z95828 Presence of other vascular implants and grafts: Secondary | ICD-10-CM | POA: Diagnosis not present

## 2024-06-18 DIAGNOSIS — E11621 Type 2 diabetes mellitus with foot ulcer: Secondary | ICD-10-CM | POA: Diagnosis present

## 2024-06-18 DIAGNOSIS — Z79899 Other long term (current) drug therapy: Secondary | ICD-10-CM | POA: Diagnosis not present

## 2024-06-18 DIAGNOSIS — Z9889 Other specified postprocedural states: Secondary | ICD-10-CM | POA: Diagnosis not present

## 2024-06-18 DIAGNOSIS — Z87442 Personal history of urinary calculi: Secondary | ICD-10-CM | POA: Diagnosis not present

## 2024-06-18 DIAGNOSIS — R7989 Other specified abnormal findings of blood chemistry: Secondary | ICD-10-CM | POA: Diagnosis present

## 2024-06-18 DIAGNOSIS — J439 Emphysema, unspecified: Secondary | ICD-10-CM | POA: Diagnosis present

## 2024-06-18 DIAGNOSIS — I130 Hypertensive heart and chronic kidney disease with heart failure and stage 1 through stage 4 chronic kidney disease, or unspecified chronic kidney disease: Secondary | ICD-10-CM | POA: Diagnosis present

## 2024-06-18 DIAGNOSIS — I5023 Acute on chronic systolic (congestive) heart failure: Secondary | ICD-10-CM | POA: Diagnosis present

## 2024-06-18 DIAGNOSIS — Z720 Tobacco use: Secondary | ICD-10-CM | POA: Diagnosis not present

## 2024-06-18 DIAGNOSIS — Z8249 Family history of ischemic heart disease and other diseases of the circulatory system: Secondary | ICD-10-CM | POA: Diagnosis not present

## 2024-06-18 DIAGNOSIS — R600 Localized edema: Secondary | ICD-10-CM

## 2024-06-18 DIAGNOSIS — L97408 Non-pressure chronic ulcer of unspecified heel and midfoot with other specified severity: Secondary | ICD-10-CM | POA: Diagnosis not present

## 2024-06-18 DIAGNOSIS — K219 Gastro-esophageal reflux disease without esophagitis: Secondary | ICD-10-CM | POA: Diagnosis present

## 2024-06-18 DIAGNOSIS — E1122 Type 2 diabetes mellitus with diabetic chronic kidney disease: Secondary | ICD-10-CM | POA: Diagnosis present

## 2024-06-18 DIAGNOSIS — G8929 Other chronic pain: Secondary | ICD-10-CM | POA: Diagnosis present

## 2024-06-18 DIAGNOSIS — L97909 Non-pressure chronic ulcer of unspecified part of unspecified lower leg with unspecified severity: Secondary | ICD-10-CM | POA: Diagnosis not present

## 2024-06-18 DIAGNOSIS — E7800 Pure hypercholesterolemia, unspecified: Secondary | ICD-10-CM | POA: Diagnosis present

## 2024-06-18 DIAGNOSIS — I701 Atherosclerosis of renal artery: Secondary | ICD-10-CM | POA: Diagnosis not present

## 2024-06-18 DIAGNOSIS — E119 Type 2 diabetes mellitus without complications: Secondary | ICD-10-CM

## 2024-06-18 DIAGNOSIS — I5021 Acute systolic (congestive) heart failure: Secondary | ICD-10-CM | POA: Diagnosis not present

## 2024-06-18 DIAGNOSIS — Z716 Tobacco abuse counseling: Secondary | ICD-10-CM

## 2024-06-18 DIAGNOSIS — E875 Hyperkalemia: Secondary | ICD-10-CM | POA: Diagnosis not present

## 2024-06-18 DIAGNOSIS — I70262 Atherosclerosis of native arteries of extremities with gangrene, left leg: Secondary | ICD-10-CM | POA: Diagnosis present

## 2024-06-18 DIAGNOSIS — I7025 Atherosclerosis of native arteries of other extremities with ulceration: Secondary | ICD-10-CM | POA: Diagnosis not present

## 2024-06-18 DIAGNOSIS — N1832 Chronic kidney disease, stage 3b: Secondary | ICD-10-CM | POA: Diagnosis present

## 2024-06-18 DIAGNOSIS — L97403 Non-pressure chronic ulcer of unspecified heel and midfoot with necrosis of muscle: Secondary | ICD-10-CM | POA: Diagnosis not present

## 2024-06-18 DIAGNOSIS — N400 Enlarged prostate without lower urinary tract symptoms: Secondary | ICD-10-CM | POA: Diagnosis present

## 2024-06-18 DIAGNOSIS — I5043 Acute on chronic combined systolic (congestive) and diastolic (congestive) heart failure: Secondary | ICD-10-CM | POA: Diagnosis present

## 2024-06-18 DIAGNOSIS — L97509 Non-pressure chronic ulcer of other part of unspecified foot with unspecified severity: Secondary | ICD-10-CM | POA: Diagnosis not present

## 2024-06-18 DIAGNOSIS — E1152 Type 2 diabetes mellitus with diabetic peripheral angiopathy with gangrene: Secondary | ICD-10-CM | POA: Diagnosis present

## 2024-06-18 DIAGNOSIS — I96 Gangrene, not elsewhere classified: Secondary | ICD-10-CM | POA: Diagnosis not present

## 2024-06-18 DIAGNOSIS — Z7982 Long term (current) use of aspirin: Secondary | ICD-10-CM | POA: Diagnosis not present

## 2024-06-18 DIAGNOSIS — I70245 Atherosclerosis of native arteries of left leg with ulceration of other part of foot: Secondary | ICD-10-CM | POA: Diagnosis not present

## 2024-06-18 DIAGNOSIS — E08621 Diabetes mellitus due to underlying condition with foot ulcer: Secondary | ICD-10-CM | POA: Diagnosis not present

## 2024-06-18 DIAGNOSIS — F17210 Nicotine dependence, cigarettes, uncomplicated: Secondary | ICD-10-CM | POA: Diagnosis present

## 2024-06-18 DIAGNOSIS — N183 Chronic kidney disease, stage 3 unspecified: Secondary | ICD-10-CM | POA: Diagnosis not present

## 2024-06-18 DIAGNOSIS — M79675 Pain in left toe(s): Secondary | ICD-10-CM | POA: Diagnosis present

## 2024-06-18 LAB — BASIC METABOLIC PANEL WITH GFR
Anion gap: 11 (ref 5–15)
BUN: 37 mg/dL — ABNORMAL HIGH (ref 8–23)
CO2: 19 mmol/L — ABNORMAL LOW (ref 22–32)
Calcium: 8.9 mg/dL (ref 8.9–10.3)
Chloride: 110 mmol/L (ref 98–111)
Creatinine, Ser: 1.59 mg/dL — ABNORMAL HIGH (ref 0.61–1.24)
GFR, Estimated: 42 mL/min — ABNORMAL LOW
Glucose, Bld: 186 mg/dL — ABNORMAL HIGH (ref 70–99)
Potassium: 5.1 mmol/L (ref 3.5–5.1)
Sodium: 141 mmol/L (ref 135–145)

## 2024-06-18 LAB — PROCALCITONIN: Procalcitonin: 0.16 ng/mL

## 2024-06-18 LAB — CBG MONITORING, ED
Glucose-Capillary: 130 mg/dL — ABNORMAL HIGH (ref 70–99)
Glucose-Capillary: 183 mg/dL — ABNORMAL HIGH (ref 70–99)

## 2024-06-18 LAB — PRO BRAIN NATRIURETIC PEPTIDE: Pro Brain Natriuretic Peptide: 23026 pg/mL — ABNORMAL HIGH

## 2024-06-18 LAB — CBC WITH DIFFERENTIAL/PLATELET
Abs Immature Granulocytes: 0.02 K/uL (ref 0.00–0.07)
Basophils Absolute: 0 K/uL (ref 0.0–0.1)
Basophils Relative: 0 %
Eosinophils Absolute: 0 K/uL (ref 0.0–0.5)
Eosinophils Relative: 0 %
HCT: 37.1 % — ABNORMAL LOW (ref 39.0–52.0)
Hemoglobin: 12.7 g/dL — ABNORMAL LOW (ref 13.0–17.0)
Immature Granulocytes: 0 %
Lymphocytes Relative: 10 %
Lymphs Abs: 0.7 K/uL (ref 0.7–4.0)
MCH: 33.6 pg (ref 26.0–34.0)
MCHC: 34.2 g/dL (ref 30.0–36.0)
MCV: 98.1 fL (ref 80.0–100.0)
Monocytes Absolute: 0.5 K/uL (ref 0.1–1.0)
Monocytes Relative: 7 %
Neutro Abs: 5.7 K/uL (ref 1.7–7.7)
Neutrophils Relative %: 83 %
Platelets: 131 K/uL — ABNORMAL LOW (ref 150–400)
RBC: 3.78 MIL/uL — ABNORMAL LOW (ref 4.22–5.81)
RDW: 14.6 % (ref 11.5–15.5)
WBC: 7 K/uL (ref 4.0–10.5)
nRBC: 0 % (ref 0.0–0.2)

## 2024-06-18 LAB — IRON AND TIBC
Iron: 63 ug/dL (ref 45–182)
Saturation Ratios: 26 % (ref 17.9–39.5)
TIBC: 242 ug/dL — ABNORMAL LOW (ref 250–450)
UIBC: 179 ug/dL

## 2024-06-18 LAB — FERRITIN: Ferritin: 167 ng/mL (ref 24–336)

## 2024-06-18 LAB — RETICULOCYTES
Immature Retic Fract: 10.4 % (ref 2.3–15.9)
RBC.: 3.75 MIL/uL — ABNORMAL LOW (ref 4.22–5.81)
Retic Count, Absolute: 53.6 K/uL (ref 19.0–186.0)
Retic Ct Pct: 1.4 % (ref 0.4–3.1)

## 2024-06-18 LAB — PHOSPHORUS: Phosphorus: 3.1 mg/dL (ref 2.5–4.6)

## 2024-06-18 LAB — CK: Total CK: 72 U/L (ref 49–397)

## 2024-06-18 LAB — TSH: TSH: 1.44 u[IU]/mL (ref 0.350–4.500)

## 2024-06-18 LAB — TROPONIN T, HIGH SENSITIVITY
Troponin T High Sensitivity: 157 ng/L (ref 0–19)
Troponin T High Sensitivity: 168 ng/L (ref 0–19)
Troponin T High Sensitivity: 158 ng/L (ref 0–19)

## 2024-06-18 LAB — T4, FREE: Free T4: 1.32 ng/dL (ref 0.80–2.00)

## 2024-06-18 LAB — MAGNESIUM: Magnesium: 1.9 mg/dL (ref 1.7–2.4)

## 2024-06-18 MED ORDER — ACETAMINOPHEN 650 MG RE SUPP: 650.0000 mg | Freq: Four times a day (QID) | RECTAL | Status: DC | PRN

## 2024-06-18 MED ORDER — ONDANSETRON HCL 4 MG/2ML IJ SOLN: 4.0000 mg | Freq: Four times a day (QID) | INTRAMUSCULAR | Status: DC | PRN

## 2024-06-18 MED ORDER — FUROSEMIDE 10 MG/ML IJ SOLN
40.0000 mg | Freq: Once | INTRAMUSCULAR | Status: AC
  Administered 2024-06-18: 40 mg via INTRAVENOUS
  Filled 2024-06-18: qty 4

## 2024-06-18 MED ORDER — ONDANSETRON HCL 4 MG PO TABS: 4.0000 mg | ORAL_TABLET | Freq: Four times a day (QID) | ORAL | Status: DC | PRN

## 2024-06-18 MED ORDER — INSULIN ASPART 100 UNIT/ML IJ SOLN
0.0000 [IU] | INTRAMUSCULAR | Status: DC
  Administered 2024-06-18: 2 [IU] via SUBCUTANEOUS
  Administered 2024-06-18 – 2024-06-19 (×3): 1 [IU] via SUBCUTANEOUS
  Administered 2024-06-19 – 2024-06-20 (×2): 2 [IU] via SUBCUTANEOUS
  Filled 2024-06-18: qty 2
  Filled 2024-06-18 (×3): qty 1
  Filled 2024-06-18: qty 2

## 2024-06-18 MED ORDER — VANCOMYCIN HCL 750 MG/150ML IV SOLN
750.0000 mg | INTRAVENOUS | Status: DC
  Administered 2024-06-18: 750 mg via INTRAVENOUS
  Filled 2024-06-18: qty 150

## 2024-06-18 MED ORDER — SODIUM CHLORIDE 0.9 % IV SOLN
250.0000 mL | INTRAVENOUS | Status: AC | PRN
  Administered 2024-06-19: 250 mL via INTRAVENOUS

## 2024-06-18 MED ORDER — SODIUM CHLORIDE 0.9 % IV SOLN
3.0000 g | Freq: Two times a day (BID) | INTRAVENOUS | Status: DC
  Administered 2024-06-18 – 2024-06-24 (×12): 3 g via INTRAVENOUS
  Filled 2024-06-18 (×13): qty 8

## 2024-06-18 MED ORDER — SODIUM CHLORIDE 0.9% FLUSH
3.0000 mL | Freq: Two times a day (BID) | INTRAVENOUS | Status: DC
  Administered 2024-06-18 – 2024-06-28 (×18): 3 mL via INTRAVENOUS

## 2024-06-18 MED ORDER — ACETAMINOPHEN 325 MG PO TABS
650.0000 mg | ORAL_TABLET | Freq: Four times a day (QID) | ORAL | Status: DC | PRN
  Administered 2024-06-19 – 2024-06-28 (×4): 650 mg via ORAL
  Filled 2024-06-18 (×4): qty 2

## 2024-06-18 MED ORDER — SODIUM CHLORIDE 0.9% FLUSH: 3.0000 mL | INTRAVENOUS | Status: DC | PRN

## 2024-06-18 MED ORDER — FENTANYL CITRATE (PF) 50 MCG/ML IJ SOSY
12.5000 ug | PREFILLED_SYRINGE | INTRAMUSCULAR | Status: DC | PRN
  Administered 2024-06-19: 25 ug via INTRAVENOUS
  Administered 2024-06-19: 50 ug via INTRAVENOUS
  Filled 2024-06-18 (×2): qty 1

## 2024-06-18 MED ORDER — FUROSEMIDE 10 MG/ML IJ SOLN
40.0000 mg | Freq: Every day | INTRAMUSCULAR | Status: DC
  Administered 2024-06-19: 40 mg via INTRAVENOUS
  Filled 2024-06-18: qty 4

## 2024-06-18 MED ORDER — GUAIFENESIN-DM 100-10 MG/5ML PO SYRP
5.0000 mL | ORAL_SOLUTION | ORAL | Status: DC | PRN
  Administered 2024-06-18 – 2024-06-29 (×5): 5 mL via ORAL
  Filled 2024-06-18 (×5): qty 5

## 2024-06-18 MED ORDER — HYDROCODONE-ACETAMINOPHEN 5-325 MG PO TABS
1.0000 | ORAL_TABLET | ORAL | Status: DC | PRN
  Administered 2024-06-19: 1 via ORAL
  Administered 2024-06-20 – 2024-06-22 (×6): 2 via ORAL
  Administered 2024-06-23: 1 via ORAL
  Administered 2024-06-23 – 2024-06-24 (×3): 2 via ORAL
  Administered 2024-06-25 (×2): 1 via ORAL
  Administered 2024-06-26: 2 via ORAL
  Administered 2024-06-26: 1 via ORAL
  Administered 2024-06-27: 2 via ORAL
  Administered 2024-06-28: 1 via ORAL
  Filled 2024-06-18 (×2): qty 2
  Filled 2024-06-18: qty 1
  Filled 2024-06-18 (×4): qty 2
  Filled 2024-06-18 (×2): qty 1
  Filled 2024-06-18 (×4): qty 2
  Filled 2024-06-18: qty 1
  Filled 2024-06-18 (×2): qty 2
  Filled 2024-06-18: qty 1
  Filled 2024-06-18: qty 2
  Filled 2024-06-18: qty 1

## 2024-06-18 NOTE — ED Triage Notes (Signed)
 C/O weakness that started last week/ swelling in bilateral legs/SHOB. Denies CP. Axox4.

## 2024-06-18 NOTE — Assessment & Plan Note (Addendum)
 Plain images showed normal evidence of osteomyelitis so defer to vascular surgery if needs MRI.  Order sed rate CRP had ABIs done in October which reportedly were unremarkable Cover tonight if Unasyn Vanco

## 2024-06-18 NOTE — Assessment & Plan Note (Signed)
-  chronic avoid nephrotoxic medications such as NSAIDs, Vanco Zosyn combo,  avoid hypotension, continue to follow renal function

## 2024-06-18 NOTE — Assessment & Plan Note (Signed)
 Monitor while diuresing

## 2024-06-18 NOTE — Assessment & Plan Note (Signed)
-   Spoke about importance of quitting spent 5 minutes discussing options for treatment, prior attempts at quitting, and dangers of smoking  -At this point patient is  interested in quitting  - declined nicotine patch   - nursing tobacco cessation protocol

## 2024-06-18 NOTE — Assessment & Plan Note (Signed)
Order sliding scale  

## 2024-06-18 NOTE — Subjective & Objective (Signed)
 Pt lives at home, has been having leg edema, and pain he has chronic pain Has hx of PVD able to get pulses dopplers bi alteral  In ER noted to have ulcer of the toe for past week, no drainage Plain imaging no osteo Patient is diabetic, poor compliance He did report SOB CXR non acute but elevated BNP  troponin is elevated but flat

## 2024-06-18 NOTE — ED Provider Triage Note (Signed)
 Emergency Medicine Provider Triage Evaluation Note  Thelbert Gartin. , a 89 y.o. male  was evaluated in triage.  Pt complains of left great toe pain, shortness of breath, bilateral leg swelling that has been going on for 1 or 2 or 3 weeks.  Patient states the pain in his left great toe is so significant that he has been unable to sleep.  He also states he has been seen in this hospital multiple times for the same complaints, but nothing is ever found.  Patient is reportedly coughing up mucus and states he is feeling weak.  Review of Systems  Positive: Left great toe swelling, bilateral pedal edema, shortness of breath Negative:   Physical Exam  BP 133/87 (BP Location: Left Arm)   Pulse 93   Temp (!) 97.4 F (36.3 C)   Resp 19   SpO2 100%  Gen:   Awake, no distress   Resp:  Normal effort  MSK:   Moves extremities without difficulty.  Bilateral pedal edema noted.  Patient does have a wound on the plantar aspect of his left great toe, possibly resembling cellulitis Other:    Medical Decision Making  Medically screening exam initiated at 3:08 PM.  Appropriate orders placed.  [name] was informed that the remainder of the evaluation will be completed by another provider, this initial triage assessment does not replace that evaluation, and the importance of remaining in the ED until their evaluation is complete.  Labs, x-ray ordered.   Torrence Marry RAMAN, [place] 06/18/24 [phone]

## 2024-06-18 NOTE — H&P (Signed)
 "    Vincent Baxter. FMW:[id number] DOB: [birth year] DOA: 06/18/2024     PCP: [facility]   Outpatient Specialists:   CARDS: Sees cardiology at [facility]   G Dr.  Gwen, LB) Avram Lupita BRAVO, MD    Patient arrived to ER on 06/18/24 at 1502 Referred by Attending Lenor Hollering, MD   Patient coming from:    home Lives alone,       Chief Complaint: Leg pain and swelling   HPI: Vincent Baxter. is a 89 y.o. male with medical history significant of systolic CHF EF 25 to 30% and grade 2 diastolic dysfunction.  COPD, DM2, renal mass, nephrolithiasis, AAA, CKD 3B, anemia GERD THC tobacco abuse, BPH    Presented with lower extremity pain and swelling as well as ulcer Pt lives at home, has been having leg edema, and pain he has chronic pain Has hx of PVD able to get pulses dopplers bi alteral  In ER noted to have ulcer of the toe for past week, no drainage Plain imaging no osteo Patient is diabetic, poor compliance He did report SOB CXR non acute but elevated BNP  troponin is elevated but flat     Seems like lower extremity pain and swelling has been ongoing he have had similar admission   in October But patient is telling me that a week ago also he had caught his toe on something and an he tried to put some Neosporin on it but today he noticed that he have had more darkening of that spot with foul smell.  Patient does not endorse nausea vomiting diarrhea no fevers or chills Patient has known history of peripheral vascular disease in October CTA abdomen pelvis showed 50% stenosis of proximal superior mesenteric artery and inferior mesenteric artery occluded at the origin but weakly reconstituted also 50% stenosis of mid left renal artery vascular surgery seen patient to time ABI were obtained and was normal CTA did show 5 cm abdominal aortic aneurysm vascular surgery recommended outpatient follow-up Recommend that he may need repair if aneurysm became more than 5.5 cm He was  seen by PT OT and they recommend SNF patient was not able to obtain his secondary to insurance issue he was discharged in October with home health  Denies significant ETOH intake   Does   smoke  but interested in quitting     Regarding pertinent Chronic problems:       HTN on Norvasc    chronic CHF diastolic/systolic/ combined - last echo  Recent Results (from the past [id number] hours)  ECHOCARDIOGRAM COMPLETE   Collection Time: 03/13/24  9:07 AM  Result Value   Weight 1,760   Height 66   BP 135/87   Single Plane A2C EF 37.9   Single Plane A4C EF 25.1   Calc EF 29.9   S' Lateral 2.80   Area-P 1/2 4.06   Est EF 25 - 30%   Narrative      ECHOCARDIOGRAM REPORT         1. No obstruction. Left ventricular ejection fraction, by estimation, is 25 to 30%. Left ventricular ejection fraction by 3D volume is 28 %. Left ventricular ejection fraction by 2D MOD biplane is 29.9 %. Left ventricular ejection fraction by PLAX is 22  %. The left ventricle has severely decreased function. The left ventricle demonstrates global hypokinesis. There is severe concentric left ventricular hypertrophy. Left ventricular diastolic parameters are consistent with Grade II diastolic dysfunction  (pseudonormalization).  The average left ventricular global longitudinal strain is -10.0 %. The global longitudinal strain is abnormal.  2. Right ventricular systolic function is mildly reduced. The right ventricular size is normal. Severely increased right ventricular wall thickness. There is normal pulmonary artery systolic pressure. The estimated right ventricular systolic pressure is  17.1 mmHg.  3. A small pericardial effusion is present. The pericardial effusion is posterior to the left ventricle and localized near the right atrium. There is no evidence of cardiac tamponade.  4. The mitral valve is normal in structure. Trivial mitral valve regurgitation. No evidence of mitral stenosis.  5. The aortic valve is tricuspid.  There is moderate calcification of the aortic valve. There is moderate thickening of the aortic valve. Aortic valve regurgitation is not visualized. Aortic valve sclerosis is present, with no evidence of aortic valve  stenosis.  6. The inferior vena cava is dilated in size with >50% respiratory variability, suggesting right atrial pressure of 8 mmHg.                DM 2 -  Lab Results  Component Value Date   HGBA1C 6.4 (H) 03/13/2024   diet controlled     COPD - not followed by pulmonology   not  on baseline oxygen  *L,       CKD stage IIIb  baseline Cr 1.7 Estimated Creatinine Clearance: 23.1 mL/min (A) (by C-G formula based on SCr of 1.59 mg/dL (H)).  Lab Results  Component Value Date   CREATININE 1.59 (H) 06/18/2024   CREATININE 1.68 (H) 05/31/2024   CREATININE 1.59 (H) 04/06/2024   Lab Results  Component Value Date   NA 141 06/18/2024   CL 110 06/18/2024   K 5.1 06/18/2024   CO2 19 (L) 06/18/2024   BUN 37 (H) 06/18/2024   CREATININE 1.59 (H) 06/18/2024   GFRNONAA 42 (L) 06/18/2024   CALCIUM  8.9 06/18/2024   PHOS 3.2 03/25/2022   ALBUMIN 4.0 05/31/2024   GLUCOSE 186 (H) 06/18/2024    Chronic anemia - baseline hg Hemoglobin & Hematocrit  Recent Labs    04/06/24 1515 05/31/24 1531 06/18/24 1601  HGB 12.2* 13.6 12.7*   Iron/TIBC/Ferritin/ %Sat    Component Value Date/Time   IRON 90 03/25/2022 0537   TIBC 306 03/25/2022 0537   FERRITIN 224 03/25/2022 0537   IRONPCTSAT 30 03/25/2022 0537      While in ER:   Plain images of ulcer did not show any evidence of osteomyelitis     Lab Orders         CBC with Differential         Basic metabolic panel         Pro Brain natriuretic peptide       CXR -  1. Stable chest with no acute cardiopulmonary process. 2. No acute osseous abnormality in the right foot.  Left foot right foot non acute  Following Medications were ordered in ER: Medications  furosemide  (LASIX ) injection 40 mg (40 mg Intravenous  Given 06/18/24 1854)         ED Triage Vitals  Encounter Vitals Group     BP 06/18/24 1506 133/87     Girls Systolic BP Percentile --      Girls Diastolic BP Percentile --      Boys Systolic BP Percentile --      Boys Diastolic BP Percentile --      Pulse Rate 06/18/24 1506 93     Resp 06/18/24 1506 19  Temp 06/18/24 1506 (!) 97.4 F (36.3 C)     Temp src --      SpO2 06/18/24 1506 100 %     Weight 06/18/24 1514 110 lb (49.9 kg)     Height 06/18/24 1514 5' 7 (1.702 m)     Head Circumference --      Peak Flow --      Pain Score 06/18/24 1511 10     Pain Loc --      Pain Education --      Exclude from Growth Chart --   UFJK(75)@     _________________________________________ Significant initial  Findings: Abnormal Labs Reviewed  CBC WITH DIFFERENTIAL/PLATELET - Abnormal; Notable for the following components:      Result Value   RBC 3.78 (*)    Hemoglobin 12.7 (*)    HCT 37.1 (*)    Platelets 131 (*)    All other components within normal limits  BASIC METABOLIC PANEL WITH GFR - Abnormal; Notable for the following components:   CO2 19 (*)    Glucose, Bld 186 (*)    BUN 37 (*)    Creatinine, Ser 1.59 (*)    GFR, Estimated 42 (*)    All other components within normal limits  PRO BRAIN NATRIURETIC PEPTIDE - Abnormal; Notable for the following components:   Pro Brain Natriuretic Peptide 23,026.0 (*)    All other components within normal limits  TROPONIN T, HIGH SENSITIVITY - Abnormal; Notable for the following components:   Troponin T High Sensitivity 168 (*)    All other components within normal limits  TROPONIN T, HIGH SENSITIVITY - Abnormal; Notable for the following components:   Troponin T High Sensitivity 158 (*)    All other components within normal limits      _________________________ Troponin 168 - 158     ECG: Ordered Personally reviewed and interpreted by me showing: HR : 89 Rhythm: Suspect arm lead reversal, interpretation assumes no reversal Sinus  rhythm with Premature supraventricular complexes Right axis deviation Low voltage QRS Cannot rule out Anterior infarct , age undetermined Abnormal ECG When compared with ECG of 31-May-2024 14:58, PREVIOUS ECG IS PRESENT since last tracing no significant change QTC 445  BNP (last 3 results) Recent Labs    03/12/24 1711  BNP 409.4*      The recent clinical data is shown below. Vitals:   06/18/24 1514 06/18/24 1815 06/18/24 1830 06/18/24 1915  BP:  (!) 159/122 (!) 153/117 (!) 166/130  Pulse:  91 85 92  Resp:  18 16 12   Temp:      SpO2:  100% 100% 100%  Weight: 49.9 kg     Height: 5' 7 (1.702 m)       WBC     Component Value Date/Time   WBC 7.0 06/18/2024 1601   LYMPHSABS 0.7 06/18/2024 1601   MONOABS 0.5 06/18/2024 1601   EOSABS 0.0 06/18/2024 1601   BASOSABS 0.0 06/18/2024 1601      Procalcitonin   Ordered        ABX started Antibiotics Given (last 72 hours)     Date/Time Action Medication Dose Rate   06/18/24 2055 New Bag/Given   Ampicillin-Sulbactam (UNASYN) 3 g in sodium chloride  0.9 % 100 mL IVPB 3 g 200 mL/hr   06/18/24 2125 New Bag/Given   vancomycin (VANCOREADY) IVPB 750 mg/150 mL 750 mg 150 mL/hr       __________________________________________________________ Recent Labs  Lab 06/18/24 1601  NA 141  K  5.1  CO2 19*  GLUCOSE 186*  BUN 37*  CREATININE 1.59*  CALCIUM  8.9    Cr   stable,    Lab Results  Component Value Date   CREATININE 1.59 (H) 06/18/2024   CREATININE 1.68 (H) 05/31/2024   CREATININE 1.59 (H) 04/06/2024    No results for input(s): AST, ALT, ALKPHOS, BILITOT, PROT, ALBUMIN in the last 168 hours. Lab Results  Component Value Date   CALCIUM  8.9 06/18/2024   PHOS 3.2 03/25/2022    Plt: Lab Results  Component Value Date   PLT 131 (L) 06/18/2024      Recent Labs  Lab 06/18/24 1601  WBC 7.0  NEUTROABS 5.7  HGB 12.7*  HCT 37.1*  MCV 98.1  PLT 131*    HG/HCT  stable     Component Value Date/Time   HGB  12.7 (L) 06/18/2024 1601   HCT 37.1 (L) 06/18/2024 1601   MCV 98.1 06/18/2024 1601   ____________________________________ Hospitalist was called for admission for  diabetic  Ulcer of left foot,  Peripheral edema  Elevated troponin     The following Work up has been ordered so far:  Orders Placed This Encounter  Procedures   DG Foot Complete Left   DG Chest 2 View   DG Foot Complete Right   CBC with Differential   Basic metabolic panel   Pro Brain natriuretic peptide   Consult for [facility] Admission   ED EKG     OTHER Significant initial  Findings:  labs showing:     DM  labs:  HbA1C: Recent Labs    03/13/24 0424  HGBA1C 6.4*       CBG (last 3)  No results for input(s): GLUCAP in the last 72 hours.        Cultures:    Component Value Date/Time   SDES BLOOD LEFT ANTECUBITAL 03/12/2024 1704   SPECREQUEST  03/12/2024 1704    BOTTLES DRAWN AEROBIC ONLY Blood Culture results may not be optimal due to an inadequate volume of blood received in culture bottles   CULT  03/12/2024 1704    NO GROWTH 5 DAYS Performed at [facility], [address], [place], [place] [address]    REPTSTATUS 03/17/2024 FINAL 03/12/2024 1704     Radiological Exams on Admission: DG Chest 2 View Result Date: 06/18/2024 CLINICAL DATA:  Cellulitis.  Weakness. EXAM: CHEST - 2 VIEW; RIGHT FOOT COMPLETE - 3+ VIEW COMPARISON:  03/12/2024, 03/23/2022. FINDINGS: Chest: The heart size and mediastinal contours are within normal limits. There is atherosclerotic calcification of the aorta. Stable interstitial prominence and emphysematous changes are noted bilaterally. There is mild atelectasis or scarring at the left lung base. No effusion or pneumothorax is seen. No acute osseous abnormality. Right foot: No acute fracture or dislocation is seen. There is hallux valgus deformity with degenerative changes at the first metatarsophalangeal joint. Degenerative changes are present in the  midfoot. There is diffuse soft tissue swelling. Vascular calcifications are noted in the soft tissues. IMPRESSION: 1. Stable chest with no acute cardiopulmonary process. 2. No acute osseous abnormality in the right foot. Electronically Signed   By: Leita Birmingham M.D.   On: 06/18/2024 16:54   DG Foot Complete Right Result Date: 06/18/2024 CLINICAL DATA:  Cellulitis.  Weakness. EXAM: CHEST - 2 VIEW; RIGHT FOOT COMPLETE - 3+ VIEW COMPARISON:  03/12/2024, 03/23/2022. FINDINGS: Chest: The heart size and mediastinal contours are within normal limits. There is atherosclerotic calcification of the aorta. Stable interstitial  prominence and emphysematous changes are noted bilaterally. There is mild atelectasis or scarring at the left lung base. No effusion or pneumothorax is seen. No acute osseous abnormality. Right foot: No acute fracture or dislocation is seen. There is hallux valgus deformity with degenerative changes at the first metatarsophalangeal joint. Degenerative changes are present in the midfoot. There is diffuse soft tissue swelling. Vascular calcifications are noted in the soft tissues. IMPRESSION: 1. Stable chest with no acute cardiopulmonary process. 2. No acute osseous abnormality in the right foot. Electronically Signed   By: Leita Birmingham M.D.   On: 06/18/2024 16:54   DG Foot Complete Left Result Date: 06/18/2024 CLINICAL DATA:  Cellulitis. EXAM: LEFT FOOT - COMPLETE 3+ VIEW COMPARISON:  Left foot radiograph dated 03/23/2022. FINDINGS: No acute fracture or dislocation. The bones are osteopenic. No periosteal elevation or bone erosion. The soft tissue swelling of the foot. No acute pathology or soft tissue gas. Vascular calcification. IMPRESSION: 1. No acute fracture or dislocation. 2. Soft tissue swelling of the foot. Electronically Signed   By: Vanetta Chou M.D.   On: 06/18/2024 16:50   _______________________________________________________________________________________________________ Latest   Blood pressure (!) 166/130, pulse 92, temperature (!) 97.4 F (36.3 C), resp. rate 12, height 5' 7 (1.702 m), weight 49.9 kg, SpO2 100%.   Vitals  labs and radiology finding personally reviewed  Review of Systems:    Pertinent positives include:  left foot pain  Constitutional:  No weight loss, night sweats, Fevers, chills, fatigue, weight loss  HEENT:  No headaches, Difficulty swallowing,Tooth/dental problems,Sore throat,  No sneezing, itching, ear ache, nasal congestion, post nasal drip,  Cardio-vascular:  No chest pain, Orthopnea, PND, anasarca, dizziness, palpitations.no Bilateral lower extremity swelling  GI:  No heartburn, indigestion, abdominal pain, nausea, vomiting, diarrhea, change in bowel habits, loss of appetite, melena, blood in stool, hematemesis Resp:  no shortness of breath at rest. No dyspnea on exertion, No excess mucus, no productive cough, No non-productive cough, No coughing up of blood.No change in color of mucus.No wheezing. Skin:  no rash or lesions. No jaundice GU:  no dysuria, change in color of urine, no urgency or frequency. No straining to urinate.  No flank pain.  Musculoskeletal:  No joint pain or no joint swelling. No decreased range of motion. No back pain.  Psych:  No change in mood or affect. No depression or anxiety. No memory loss.  Neuro: no localizing neurological complaints, no tingling, no weakness, no double vision, no gait abnormality, no slurred speech, no confusion  All systems reviewed and apart from HOPI all are negative _______________________________________________________________________________________________ Past Medical History:   Past Medical History:  Diagnosis Date   Diabetes mellitus 01/2009 dx   DYSLIPIDEMIA    GERD    Hypertension    Kidney stones    RENAL CALCULUS 01/04/2009      Past Surgical History:  Procedure Laterality Date   NO PAST SURGERIES      Social History:  Ambulatory   independently       reports that he has been smoking cigarettes. He has never used smokeless tobacco. He reports that he does not drink alcohol and does not use drugs.   Family History:   Family History  Problem Relation Age of Onset   Hypertension Mother    ______________________________________________________________________________________________ Allergies: Allergies[1]   Prior to Admission medications  Not on File    ___________________________________________________________________________________________________ Physical Exam:    06/18/2024    7:15 PM 06/18/2024    6:30 PM 06/18/2024  6:15 PM  Vitals with BMI  Systolic 166 153 840  Diastolic 130 117 877  Pulse 92 85 91     1. General:  in No  Acute distress   Chronically ill   -appearing 2. Psychological: Alert and   Oriented 3. Head/ENT:   Dry Mucous Membranes                          Head Non traumatic, neck supple                        Poor Dentition 4. SKIN: decreased Skin turgor,  Skin clean Dry and intact no rash    5. Heart: Regular rate and rhythm no  Murmur, no Rub or gallop 6. Lungs:  no wheezes or crackles   7. Abdomen: Soft,  non-tender, Non distended   obese  bowel sounds present 8. Lower extremities: no clubbing, cyanosis,trace   edema 9. Neurologically Grossly intact, moving all 4 extremities equally   10. MSK: Normal range of motion    Chart has been reviewed  ______________________________________________________________________________________________  Assessment/Plan 89 y.o. male with medical history significant of systolic CHF EF 25 to 30% and grade 2 diastolic dysfunction.  COPD, DM2, renal mass, nephrolithiasis, AAA, CKD 3B, anemia GERD THC tobacco abuse, BPH  Admitted for   diabetic  Ulcer of left foot,  Peripheral edema  Elevated troponin   Present on Admission:  Diabetic foot ulcer (HCC)  CKD (chronic kidney disease) stage 3, GFR 30-59 ml/min (HCC)  COPD (chronic obstructive pulmonary disease)  with emphysema (HCC)  Elevated troponin  Essential hypertension  Acute on chronic systolic CHF (congestive heart failure) (HCC)  Tobacco abuse     CKD (chronic kidney disease) stage 3, GFR 30-59 ml/min (HCC)  -chronic avoid nephrotoxic medications such as NSAIDs, Vanco Zosyn combo,  avoid hypotension, continue to follow renal function   COPD (chronic obstructive pulmonary disease) with emphysema (HCC) Chronic stable will continue home medications  Diabetic foot ulcer (HCC) Plain images showed normal evidence of osteomyelitis so defer to vascular surgery if needs MRI.  Order sed rate CRP had ABIs done in October which reportedly were unremarkable Cover tonight if Unasyn Vanco  DM2 (diabetes mellitus, type 2) (HCC) Order sliding scale  Elevated troponin Chronic stable patient denies any chest pain in the setting of CHF exacerbation  Essential hypertension Monitor while diuresing  Acute on chronic systolic CHF (congestive heart failure) (HCC) - Pt diagnosed with CHF based on presence of the following  bilateral leg edema,  With noted response to IV diuretic in ER  admit on telemetry,  cycle cardiac enzymes,   obtain serial ECG  to evaluate for ischemia as a cause of heart failure  monitor daily weight:  Filed Weights   06/18/24 1514  Weight: 49.9 kg   Last BNP BNP (last 3 results) Recent Labs    03/12/24 1711  BNP 409.4*       diurese with IV lasix    40 mg IV Daily and monitor orthostatics and creatinine to avoid over diuresis.  Order echogram to evaluate EF and valves      Tobacco abuse  - Spoke about importance of quitting spent 5 minutes discussing options for treatment, prior attempts at quitting, and dangers of smoking  -At this point patient is     interested in quitting  - declined nicotine patch   - nursing tobacco cessation protocol   Other  plan as per orders.  DVT prophylaxis:  SCD   Code Status:    Code Status: Prior FULL CODE as per patient    I had personally discussed CODE STATUS with patient   ACP   none   Family Communication:   Family not at  Bedside    Diet heart healthy,    Disposition Plan:    To home once workup is complete and patient is stable   Following barriers for discharge:                                                       Pain controlled with PO medications                               Afebrile, white count improving able to transition to PO antibiotics                                                         Will need consultants to evaluate patient prior to discharge                                         Would benefit from PT/OT eval prior to DC  Ordered                                       Consults called: sent msg to Vascular surgery, please consult officially in AM   Admission status:  ED Disposition     ED Disposition  Admit   Condition  --   Comment  Hospital Area: [facility] [[id number]]  Level of Care: Telemetry [5]  May admit patient to [facility] or [facility] if equivalent level of care is available:: No  Diagnosis: Diabetic foot ulcer [facility]) [[id number]]  Admitting Physician: Thera Basden [[id number]]  Attending Physician: Issac Moure [[id number]]  Certification:: I certify this patient will need inpatient services for at least 2 midnights  Expected Medical Readiness: 06/20/2024            inpatient     I Expect 2 midnight stay secondary to severity of patient's current illness need for inpatient interventions justified by the following:  Severe lab/radiological/exam abnormalities including:    Ulcer of left foot, unspecified ulcer stage (HCC) Acute on chronic systolic CHF Elevated troponin    and extensive comorbidities including: DM2   CHF   That are currently affecting medical management.   I expect  patient to be hospitalized for 2 midnights requiring inpatient medical care.  Patient is at high risk for adverse outcome (such as loss of life or  disability) if not treated.  Indication for inpatient stay as follows:   t Need for operative/procedural  intervention    Need for IV antibiotics, IV fluids,, IV pain medications,     Level of care     tele  For  24H  Naziah Weckerly 06/18/2024, 11:48 PM    [facility]     after 2 AM please page floor coverage   If 7AM-7PM, please contact the day team taking care of the patient using [email or web address]        [1] No Known Allergies  "

## 2024-06-18 NOTE — Progress Notes (Signed)
 Pharmacy Antibiotic Note  Vincent Baxter. is a 89 y.o. male admitted on 06/18/2024 with wound infection.  Pharmacy has been consulted for Unasyn and vancomycin dosing. No notable micro hx in the past year.   Plan: Unasyn 3g q12h.  Vancomycin 750mg  q48h eAUC: 442 using TBW given < IBW. Scr: 1.59. Follow culture data for de-escalation.  Monitor renal function for dose adjustments as indicated.   Height: 5' 7 (170.2 cm) Weight: 49.9 kg (110 lb) IBW/kg (Calculated) : 66.1  Temp (24hrs), Avg:97.6 F (36.4 C), Min:97.4 F (36.3 C), Max:97.8 F (36.6 C)  Recent Labs  Lab 06/18/24 1601  WBC 7.0  CREATININE 1.59*    Estimated Creatinine Clearance: 23.1 mL/min (A) (by C-G formula based on SCr of 1.59 mg/dL (H)).    Allergies[1]  Antimicrobials this admission: Unasyn 1/1 >>  Vancomycin 1/1 >>    Thank you for allowing pharmacy to be a part of this patients care.  Powell Blush, PharmD, BCCCP  06/18/2024 8:35 PM     [1] No Known Allergies

## 2024-06-18 NOTE — Assessment & Plan Note (Signed)
-   Pt diagnosed with CHF based on presence of the following  bilateral leg edema,  With noted response to IV diuretic in ER  admit on telemetry,  cycle cardiac enzymes,   obtain serial ECG  to evaluate for ischemia as a cause of heart failure  monitor daily weight:  Filed Weights   06/18/24 1514  Weight: 49.9 kg   Last BNP BNP (last 3 results) Recent Labs    03/12/24 1711  BNP 409.4*       diurese with IV lasix    40 mg IV Daily and monitor orthostatics and creatinine to avoid over diuresis.  Order echogram to evaluate EF and valves

## 2024-06-18 NOTE — ED Provider Notes (Signed)
 " [facility] Provider Note   CSN: [id number] Arrival date & time: 06/18/24  1502     Patient presents with: No chief complaint on file.   Vincent Baxter. is a 89 y.o. male.   Patient is a 89 year old male with a history of diabetes, hypertension, hyperlipidemia.  He states he currently does not have a primary care doctor.  He has been having some increased swelling in his lower legs going up toward his scrotum.  He says it is making it hard to walk.  He has been having some shortness of breath as well.  No associated chest pain.  No fevers.  No cough or cold symptoms.  No recent vomiting or diarrhea.  He does have a wound on his left big toe that he said started about a week ago.  He said he thinks he may have cut it on something.  He has been seen for lower leg complaints in the past as far as weakness in the legs.  He was admitted in September for multifocal pneumonia.  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  Vascular surgery was consulted.  Reportedly he had normal ABIs at that time and work on the monitor of the AAA.       Prior to Admission medications  Not on File    Allergies: Patient has no known allergies.    Review of Systems  Constitutional:  Positive for fatigue. Negative for chills, diaphoresis and fever.  HENT:  Negative for congestion, rhinorrhea and sneezing.   Eyes: Negative.   Respiratory:  Positive for shortness of breath. Negative for cough and chest tightness.   Cardiovascular:  Positive for leg swelling. Negative for chest pain.  Gastrointestinal:  Negative for abdominal pain, diarrhea, nausea and vomiting.  Genitourinary:  Negative for difficulty urinating, flank pain, frequency and hematuria.  Musculoskeletal:  Positive for arthralgias. Negative for back pain.  Skin:  Positive for wound. Negative for rash.  Neurological:   Positive for weakness (Generalized). Negative for dizziness, speech difficulty, numbness and headaches.    Updated Vital Signs BP (!) 128/94   Pulse 84   Temp 97.8 F (36.6 C) (Oral)   Resp 17   Ht 5' 7 (1.702 m)   Wt 49.9 kg   SpO2 100%   BMI 17.23 kg/m   Physical Exam Constitutional:      Appearance: He is well-developed.  HENT:     Head: Normocephalic and atraumatic.  Eyes:     Pupils: Pupils are equal, round, and reactive to light.  Cardiovascular:     Rate and Rhythm: Normal rate and regular rhythm.     Heart sounds: Normal heart sounds.  Pulmonary:     Effort: Pulmonary effort is normal. No respiratory distress.     Breath sounds: Normal breath sounds. No wheezing or rales.  Chest:     Chest wall: No tenderness.  Abdominal:     General: Bowel sounds are normal.     Palpations: Abdomen is soft.     Tenderness: There is no abdominal tenderness. There is no guarding or rebound.  Musculoskeletal:        General: Normal range of motion.     Cervical back: Normal range of motion and neck supple.     Comments: Positive edema to lower extremities bilaterally.  Pulses are obtained in the  feet bilaterally.  He does have blood flow in the left foot but does not seem to have a great waveform.  He has a wound with blackened discoloration at the base of his left toe.  It is foul-smelling but no purulent drainage is noted.  Lymphadenopathy:     Cervical: No cervical adenopathy.  Skin:    General: Skin is warm and dry.     Findings: No rash.  Neurological:     Mental Status: He is alert and oriented to person, place, and time.     (all labs ordered are listed, but only abnormal results are displayed) Labs Reviewed  CBC WITH DIFFERENTIAL/PLATELET - Abnormal; Notable for the following components:      Result Value   RBC 3.78 (*)    Hemoglobin 12.7 (*)    HCT 37.1 (*)    Platelets 131 (*)    All other components within normal limits  BASIC METABOLIC PANEL WITH GFR -  Abnormal; Notable for the following components:   CO2 19 (*)    Glucose, Bld 186 (*)    BUN 37 (*)    Creatinine, Ser 1.59 (*)    GFR, Estimated 42 (*)    All other components within normal limits  PRO BRAIN NATRIURETIC PEPTIDE - Abnormal; Notable for the following components:   Pro Brain Natriuretic Peptide 23,026.0 (*)    All other components within normal limits  RETICULOCYTES - Abnormal; Notable for the following components:   RBC. 3.75 (*)    All other components within normal limits  CBG MONITORING, ED - Abnormal; Notable for the following components:   Glucose-Capillary 130 (*)    All other components within normal limits  TROPONIN T, HIGH SENSITIVITY - Abnormal; Notable for the following components:   Troponin T High Sensitivity 168 (*)    All other components within normal limits  TROPONIN T, HIGH SENSITIVITY - Abnormal; Notable for the following components:   Troponin T High Sensitivity 158 (*)    All other components within normal limits  HEMOGLOBIN A1C  VITAMIN B12  FOLATE  CK  MAGNESIUM   PHOSPHORUS  TSH  T4, FREE  PROCALCITONIN  PREALBUMIN  T3  C-REACTIVE PROTEIN  IRON AND TIBC  FERRITIN  SEDIMENTATION RATE  TROPONIN T, HIGH SENSITIVITY    EKG: EKG Interpretation Date/Time:  Thursday June 18 2024 15:32:32 EST Ventricular Rate:  89 PR Interval:  194 QRS Duration:  80 QT Interval:  366 QTC Calculation: 445 R Axis:   112  Text Interpretation: Suspect arm lead reversal, interpretation assumes no reversal Sinus rhythm with Premature supraventricular complexes Right axis deviation Low voltage QRS Cannot rule out Anterior infarct , age undetermined Abnormal ECG When compared with ECG of 31-May-2024 14:58, PREVIOUS ECG IS PRESENT since last tracing no significant change Confirmed by Lenor Hollering ([id number]) on 06/18/2024 5:59:37 PM  Radiology: [facility] Chest 2 View Result Date: 06/18/2024 CLINICAL DATA:  Cellulitis.  Weakness. EXAM: CHEST - 2 VIEW; RIGHT FOOT  COMPLETE - 3+ VIEW COMPARISON:  03/12/2024, 03/23/2022. FINDINGS: Chest: The heart size and mediastinal contours are within normal limits. There is atherosclerotic calcification of the aorta. Stable interstitial prominence and emphysematous changes are noted bilaterally. There is mild atelectasis or scarring at the left lung base. No effusion or pneumothorax is seen. No acute osseous abnormality. Right foot: No acute fracture or dislocation is seen. There is hallux valgus deformity with degenerative changes at the first metatarsophalangeal joint. Degenerative changes are present in the midfoot. There is diffuse  soft tissue swelling. Vascular calcifications are noted in the soft tissues. IMPRESSION: 1. Stable chest with no acute cardiopulmonary process. 2. No acute osseous abnormality in the right foot. Electronically Signed   By: Leita Birmingham M.D.   On: 06/18/2024 16:54   DG Foot Complete Right Result Date: 06/18/2024 CLINICAL DATA:  Cellulitis.  Weakness. EXAM: CHEST - 2 VIEW; RIGHT FOOT COMPLETE - 3+ VIEW COMPARISON:  03/12/2024, 03/23/2022. FINDINGS: Chest: The heart size and mediastinal contours are within normal limits. There is atherosclerotic calcification of the aorta. Stable interstitial prominence and emphysematous changes are noted bilaterally. There is mild atelectasis or scarring at the left lung base. No effusion or pneumothorax is seen. No acute osseous abnormality. Right foot: No acute fracture or dislocation is seen. There is hallux valgus deformity with degenerative changes at the first metatarsophalangeal joint. Degenerative changes are present in the midfoot. There is diffuse soft tissue swelling. Vascular calcifications are noted in the soft tissues. IMPRESSION: 1. Stable chest with no acute cardiopulmonary process. 2. No acute osseous abnormality in the right foot. Electronically Signed   By: Leita Birmingham M.D.   On: 06/18/2024 16:54   DG Foot Complete Left Result Date: 06/18/2024 CLINICAL  DATA:  Cellulitis. EXAM: LEFT FOOT - COMPLETE 3+ VIEW COMPARISON:  Left foot radiograph dated 03/23/2022. FINDINGS: No acute fracture or dislocation. The bones are osteopenic. No periosteal elevation or bone erosion. The soft tissue swelling of the foot. No acute pathology or soft tissue gas. Vascular calcification. IMPRESSION: 1. No acute fracture or dislocation. 2. Soft tissue swelling of the foot. Electronically Signed   By: Vanetta Chou M.D.   On: 06/18/2024 16:50     Procedures   Medications Ordered in the ED  insulin  aspart (novoLOG ) injection 0-9 Units (1 Units Subcutaneous Given 06/18/24 2055)  Ampicillin-Sulbactam (UNASYN) 3 g in sodium chloride  0.9 % 100 mL IVPB (0 g Intravenous Stopped 06/18/24 2125)  vancomycin (VANCOREADY) IVPB 750 mg/150 mL (0 mg Intravenous Stopped 06/18/24 2225)  furosemide  (LASIX ) injection 40 mg (40 mg Intravenous Given 06/18/24 1854)                                    Medical Decision Making Amount and/or Complexity of Data Reviewed Radiology: ordered.  Risk Prescription drug management. Decision regarding hospitalization.   This patient presents to the ED for concern of leg swelling, this involves an extensive number of treatment options, and is a complaint that carries with it a high risk of complications and morbidity.  I considered the following differential and admission for this acute, potentially life threatening condition.  The differential diagnosis includes CH F exacerbation, pulmonary edema, peripheral edema, DVT, cellulitis  MDM:    Patient is an 89 year old who presents with increased lower extremity swelling.  Chest x-ray does not show any evidence of pulmonary edema.  EKG does not show any ischemic changes.  He does have a markedly elevated BNP and elevated but flat troponins.  Will give him a dose of IV Lasix .  He also has a wound on his left foot that looks necrotic.  X-ray does not show any evidence of osteomyelitis.  I am able to get  dopplerable pulses but he does not have a strong waveform signal on the left.  I do not suspect acute ischemia.  He does not have any color change.  He recently had normal ABIs in September.  Will likely need  a vascular surgery consult regarding this wound.  May need to start antibiotics.  Discussed with Dr. Silvester who will admit the patient for further treatment.  (Labs, imaging, consults)  Labs: I Ordered, and personally interpreted labs.  The pertinent results include: Normal white count, mild anemia, elevated creatinine but similar to prior values, elevated but flat troponins, markedly elevated BNP  Imaging Studies ordered: I ordered imaging studies including chest x-ray, bilateral foot x-ray I independently visualized and interpreted imaging. I agree with the radiologist interpretation  Additional history obtained from prior notes.  External records from outside source obtained and reviewed including hospitalization notes  Cardiac Monitoring: The patient was maintained on a cardiac monitor.  If on the cardiac monitor, I personally viewed and interpreted the cardiac monitored which showed an underlying rhythm of: Sinus rhythm  Reevaluation: After the interventions noted above, I reevaluated the patient and found that they have :improved  Social Determinants of Health:    Disposition: Admit to hospital  Co morbidities that complicate the patient evaluation  Past Medical History:  Diagnosis Date   Diabetes mellitus 01/2009 dx   DYSLIPIDEMIA    GERD    Hypertension    Kidney stones    RENAL CALCULUS 01/04/2009     Medicines Meds ordered this encounter  Medications   furosemide  (LASIX ) injection 40 mg   insulin  aspart (novoLOG ) injection 0-9 Units    Correction coverage::   Sensitive (thin, NPO, renal)    CBG < 70::   Implement Hypoglycemia Standing Orders and refer to Hypoglycemia Standing Orders sidebar report    CBG 70 - 120::   0 units    CBG 121 - 150::   1 unit     CBG 151 - 200::   2 units    CBG 201 - 250::   3 units    CBG 251 - 300::   5 units    CBG 301 - 350::   7 units    CBG 351 - 400:   9 units    CBG > 400:   call MD and obtain STAT lab verification   Ampicillin-Sulbactam (UNASYN) 3 g in sodium chloride  0.9 % 100 mL IVPB    Antibiotic Indication::   Wound Infection   vancomycin (VANCOREADY) IVPB 750 mg/150 mL    Indication::   Wound Infection    I have reviewed the patients home medicines and have made adjustments as needed  Problem List / ED Course: Problem List Items Addressed This Visit       Other   Elevated troponin   Chronic stable patient denies any chest pain in the setting of CHF exacerbation      Other Visit Diagnoses       Ulcer of left foot, unspecified ulcer stage (HCC)    -  Primary     Peripheral edema                    Final diagnoses:  Ulcer of left foot, unspecified ulcer stage (HCC)  Peripheral edema  Elevated troponin    ED Discharge Orders     None          Lenor Hollering, MD 06/18/24 2310  "

## 2024-06-18 NOTE — Assessment & Plan Note (Signed)
 Chronic stable patient denies any chest pain in the setting of CHF exacerbation

## 2024-06-18 NOTE — Assessment & Plan Note (Signed)
 Chronic stable will continue home medications

## 2024-06-19 ENCOUNTER — Inpatient Hospital Stay (HOSPITAL_COMMUNITY)

## 2024-06-19 ENCOUNTER — Encounter (HOSPITAL_COMMUNITY)
Admission: EM | Disposition: A | Discharge status: Home / Self Care | Payer: Self-pay | Source: Home / Self Care | Attending: Internal Medicine

## 2024-06-19 DIAGNOSIS — L97408 Non-pressure chronic ulcer of unspecified heel and midfoot with other specified severity: Secondary | ICD-10-CM | POA: Diagnosis not present

## 2024-06-19 DIAGNOSIS — I7025 Atherosclerosis of native arteries of other extremities with ulceration: Secondary | ICD-10-CM

## 2024-06-19 DIAGNOSIS — L97403 Non-pressure chronic ulcer of unspecified heel and midfoot with necrosis of muscle: Secondary | ICD-10-CM | POA: Diagnosis not present

## 2024-06-19 DIAGNOSIS — L97909 Non-pressure chronic ulcer of unspecified part of unspecified lower leg with unspecified severity: Secondary | ICD-10-CM | POA: Diagnosis not present

## 2024-06-19 DIAGNOSIS — L97529 Non-pressure chronic ulcer of other part of left foot with unspecified severity: Secondary | ICD-10-CM | POA: Diagnosis not present

## 2024-06-19 DIAGNOSIS — I701 Atherosclerosis of renal artery: Secondary | ICD-10-CM | POA: Diagnosis not present

## 2024-06-19 DIAGNOSIS — I70245 Atherosclerosis of native arteries of left leg with ulceration of other part of foot: Secondary | ICD-10-CM | POA: Diagnosis not present

## 2024-06-19 DIAGNOSIS — L97509 Non-pressure chronic ulcer of other part of unspecified foot with unspecified severity: Secondary | ICD-10-CM

## 2024-06-19 DIAGNOSIS — E08621 Diabetes mellitus due to underlying condition with foot ulcer: Secondary | ICD-10-CM | POA: Diagnosis not present

## 2024-06-19 HISTORY — PX: ABDOMINAL AORTOGRAM W/LOWER EXTREMITY: CATH118223

## 2024-06-19 HISTORY — PX: LOWER EXTREMITY INTERVENTION: CATH118252

## 2024-06-19 LAB — COMPREHENSIVE METABOLIC PANEL WITH GFR
ALT: 32 U/L (ref 0–44)
AST: 24 U/L (ref 15–41)
Albumin: 4 g/dL (ref 3.5–5.0)
Alkaline Phosphatase: 128 U/L — ABNORMAL HIGH (ref 38–126)
Anion gap: 11 (ref 5–15)
BUN: 38 mg/dL — ABNORMAL HIGH (ref 8–23)
CO2: 24 mmol/L (ref 22–32)
Calcium: 9.1 mg/dL (ref 8.9–10.3)
Chloride: 107 mmol/L (ref 98–111)
Creatinine, Ser: 1.63 mg/dL — ABNORMAL HIGH (ref 0.61–1.24)
GFR, Estimated: 41 mL/min — ABNORMAL LOW
Glucose, Bld: 80 mg/dL (ref 70–99)
Potassium: 4.9 mmol/L (ref 3.5–5.1)
Sodium: 142 mmol/L (ref 135–145)
Total Bilirubin: 0.6 mg/dL (ref 0.0–1.2)
Total Protein: 6.5 g/dL (ref 6.5–8.1)

## 2024-06-19 LAB — CBG MONITORING, ED
Glucose-Capillary: 76 mg/dL (ref 70–99)
Glucose-Capillary: 125 mg/dL — ABNORMAL HIGH (ref 70–99)
Glucose-Capillary: 128 mg/dL — ABNORMAL HIGH (ref 70–99)

## 2024-06-19 LAB — GLUCOSE, CAPILLARY
Glucose-Capillary: 155 mg/dL — ABNORMAL HIGH (ref 70–99)
Glucose-Capillary: 181 mg/dL — ABNORMAL HIGH (ref 70–99)

## 2024-06-19 LAB — HEMOGLOBIN A1C
Hgb A1c MFr Bld: 6.8 % — ABNORMAL HIGH (ref 4.8–5.6)
Mean Plasma Glucose: 148.46 mg/dL

## 2024-06-19 LAB — TROPONIN T, HIGH SENSITIVITY: Troponin T High Sensitivity: 178 ng/L (ref 0–19)

## 2024-06-19 LAB — CBC
HCT: 38.3 % — ABNORMAL LOW (ref 39.0–52.0)
Hemoglobin: 13.3 g/dL (ref 13.0–17.0)
MCH: 33.8 pg (ref 26.0–34.0)
MCHC: 34.7 g/dL (ref 30.0–36.0)
MCV: 97.5 fL (ref 80.0–100.0)
Platelets: 128 K/uL — ABNORMAL LOW (ref 150–400)
RBC: 3.93 MIL/uL — ABNORMAL LOW (ref 4.22–5.81)
RDW: 14.6 % (ref 11.5–15.5)
WBC: 7.9 K/uL (ref 4.0–10.5)
nRBC: 0 % (ref 0.0–0.2)

## 2024-06-19 LAB — PREALBUMIN: Prealbumin: 7 mg/dL — ABNORMAL LOW (ref 18–38)

## 2024-06-19 LAB — FOLATE: Folate: 4.9 ng/mL — ABNORMAL LOW

## 2024-06-19 LAB — MAGNESIUM: Magnesium: 1.8 mg/dL (ref 1.7–2.4)

## 2024-06-19 LAB — C-REACTIVE PROTEIN: CRP: <0.5 mg/dL

## 2024-06-19 LAB — SEDIMENTATION RATE: Sed Rate: 5 mm/h (ref 0–16)

## 2024-06-19 LAB — VITAMIN B12: Vitamin B-12: 642 pg/mL (ref 180–914)

## 2024-06-19 LAB — PHOSPHORUS: Phosphorus: 2.6 mg/dL (ref 2.5–4.6)

## 2024-06-19 MED ORDER — HEPARIN SODIUM (PORCINE) 1000 UNIT/ML IJ SOLN
INTRAMUSCULAR | Status: AC
  Filled 2024-06-19: qty 10

## 2024-06-19 MED ORDER — FENTANYL CITRATE (PF) 100 MCG/2ML IJ SOLN
INTRAMUSCULAR | Status: DC | PRN
  Administered 2024-06-19: 25 ug via INTRAVENOUS

## 2024-06-19 MED ORDER — IODIXANOL 320 MG/ML IV SOLN
INTRAVENOUS | Status: DC | PRN
  Administered 2024-06-19: 100 mL

## 2024-06-19 MED ORDER — CLOPIDOGREL BISULFATE 300 MG PO TABS
ORAL_TABLET | ORAL | Status: AC
  Filled 2024-06-19: qty 1

## 2024-06-19 MED ORDER — LIDOCAINE HCL (PF) 1 % IJ SOLN
INTRAMUSCULAR | Status: AC
  Filled 2024-06-19: qty 30

## 2024-06-19 MED ORDER — SODIUM CHLORIDE 0.9% FLUSH
3.0000 mL | Freq: Two times a day (BID) | INTRAVENOUS | Status: DC
  Administered 2024-06-20 – 2024-06-28 (×15): 3 mL via INTRAVENOUS

## 2024-06-19 MED ORDER — ASPIRIN 81 MG PO CHEW
CHEWABLE_TABLET | ORAL | Status: DC | PRN
  Administered 2024-06-19: 325 mg via ORAL

## 2024-06-19 MED ORDER — MIDAZOLAM HCL 2 MG/2ML IJ SOLN
INTRAMUSCULAR | Status: AC
  Filled 2024-06-19: qty 2

## 2024-06-19 MED ORDER — ROSUVASTATIN CALCIUM 20 MG PO TABS: 20.0000 mg | ORAL_TABLET | Freq: Every day | ORAL | Status: DC

## 2024-06-19 MED ORDER — HEPARIN SODIUM (PORCINE) 1000 UNIT/ML IJ SOLN
INTRAMUSCULAR | Status: DC | PRN
  Administered 2024-06-19 (×2): 2000 [IU] via INTRAVENOUS
  Administered 2024-06-19: 5000 [IU] via INTRAVENOUS

## 2024-06-19 MED ORDER — LABETALOL HCL 5 MG/ML IV SOLN: 10.0000 mg | INTRAVENOUS | Status: DC | PRN

## 2024-06-19 MED ORDER — SODIUM CHLORIDE 0.9% FLUSH: 3.0000 mL | INTRAVENOUS | Status: DC | PRN

## 2024-06-19 MED ORDER — HYDRALAZINE HCL 20 MG/ML IJ SOLN: 5.0000 mg | INTRAMUSCULAR | Status: DC | PRN

## 2024-06-19 MED ORDER — HEPARIN (PORCINE) IN NACL 1000-0.9 UT/500ML-% IV SOLN
INTRAVENOUS | Status: DC | PRN
  Administered 2024-06-19 (×2): 500 mL
  Administered 2024-06-19: 1000 mL

## 2024-06-19 MED ORDER — SODIUM CHLORIDE 0.9 % IV SOLN: 250.0000 mL | INTRAVENOUS | Status: AC | PRN

## 2024-06-19 MED ORDER — CLOPIDOGREL BISULFATE 75 MG PO TABS
75.0000 mg | ORAL_TABLET | Freq: Every day | ORAL | Status: DC
  Administered 2024-06-20 – 2024-06-28 (×9): 75 mg via ORAL
  Filled 2024-06-19 (×10): qty 1

## 2024-06-19 MED ORDER — ASPIRIN 325 MG PO TABS
ORAL_TABLET | ORAL | Status: AC
  Filled 2024-06-19: qty 1

## 2024-06-19 MED ORDER — CLOPIDOGREL BISULFATE 300 MG PO TABS
ORAL_TABLET | ORAL | Status: DC | PRN
  Administered 2024-06-19: 300 mg via ORAL

## 2024-06-19 MED ORDER — ATORVASTATIN CALCIUM 40 MG PO TABS
40.0000 mg | ORAL_TABLET | Freq: Every day | ORAL | Status: DC
  Administered 2024-06-19 – 2024-06-28 (×10): 40 mg via ORAL
  Filled 2024-06-19 (×11): qty 1

## 2024-06-19 MED ORDER — CLOPIDOGREL BISULFATE 75 MG PO TABS
300.0000 mg | ORAL_TABLET | Freq: Once | ORAL | Status: AC
  Administered 2024-06-19: 300 mg via ORAL
  Filled 2024-06-19: qty 4

## 2024-06-19 MED ORDER — SODIUM CHLORIDE 0.9 % WEIGHT BASED INFUSION
1.0000 mL/kg/h | INTRAVENOUS | Status: AC
  Administered 2024-06-20: 1 mL/kg/h via INTRAVENOUS

## 2024-06-19 MED ORDER — ASPIRIN 325 MG PO TBEC
325.0000 mg | DELAYED_RELEASE_TABLET | Freq: Every day | ORAL | Status: DC
  Administered 2024-06-19: 325 mg via ORAL
  Filled 2024-06-19: qty 1

## 2024-06-19 MED ORDER — MIDAZOLAM HCL (PF) 2 MG/2ML IJ SOLN
INTRAMUSCULAR | Status: DC | PRN
  Administered 2024-06-19: .5 mg via INTRAVENOUS

## 2024-06-19 MED ORDER — LIDOCAINE HCL (PF) 1 % IJ SOLN
INTRAMUSCULAR | Status: DC | PRN
  Administered 2024-06-19: 15 mL

## 2024-06-19 MED ORDER — FENTANYL CITRATE (PF) 100 MCG/2ML IJ SOLN
INTRAMUSCULAR | Status: AC
  Filled 2024-06-19: qty 2

## 2024-06-19 NOTE — Evaluation (Signed)
 Physical Therapy Evaluation Patient Details Name: Vincent Baxter. MRN: [id number] DOB: 1934/10/21 Today's Date: 06/19/2024  History of Present Illness  89 yo M adm 06/18/24 with B LE swelling, L great toe wound x 1 week PMH: HTN, COPD, T2DM, renal mass, nephrolithiasis, AAA, CKD, anemia, GERD, THC and tobacco use, BPH, CHF with EF 25-30%.  Clinical Impression  Pt is currently limited by pain/stiffness in the bil LE. Pt reports severe pain in the L foot with WB. Pt was mod I for bed mobility, has some assistance at home from nephew but mostly alone. Pt was unable to get to standing this session due to reports of significant pain in WB. Due to pt current functional status, home set up and available assistance at home recommending skilled physical therapy services < 3 hours/day in order to address strength, balance and functional mobility to decrease risk for falls, injury, immobility, skin break down and re-hospitalization.          If plan is discharge home, recommend the following: Assistance with cooking/housework;Assist for transportation   Can travel by private vehicle   Yes    Equipment Recommendations Wheelchair (measurements PT);Wheelchair cushion (measurements PT);BSC/3in1     Functional Status Assessment Patient has had a recent decline in their functional status and demonstrates the ability to make significant improvements in function in a reasonable and predictable amount of time.     Precautions / Restrictions Precautions Precautions: Fall Recall of Precautions/Restrictions: Intact Restrictions Weight Bearing Restrictions Per Provider Order: No      Mobility  Bed Mobility Overal bed mobility: Modified Independent    Transfers   General transfer comment: pt declined attempt to stand citing stiffness in LEs      Balance Overall balance assessment: Needs assistance   Sitting balance-Leahy Scale: Good             Pertinent Vitals/Pain Pain Assessment Pain  Assessment: Faces Faces Pain Scale: Hurts little more Pain Location: B lower legs Pain Descriptors / Indicators: Other (Comment) (stiffness) Pain Intervention(s): Monitored during session, Limited activity within patient's tolerance    Home Living Family/patient expects to be discharged to:: Private residence Living Arrangements: Alone Available Help at Discharge: Family;Available PRN/intermittently (nephew) Type of Home: House Home Access: Level entry       Home Layout: One level Home Equipment: None      Prior Function Prior Level of Function : Independent/Modified Independent;Driving       Extremity/Trunk Assessment   Upper Extremity Assessment Upper Extremity Assessment: Defer to OT evaluation    Lower Extremity Assessment Lower Extremity Assessment: LLE deficits/detail LLE Deficits / Details: pt is very guarded of LE LLE: Unable to fully assess due to pain    Cervical / Trunk Assessment Cervical / Trunk Assessment: Normal  Communication   Communication Communication: No apparent difficulties    Cognition Arousal: Alert Behavior During Therapy: WFL for tasks assessed/performed   PT - Cognitive impairments: Problem solving     Following commands: Intact       Cueing Cueing Techniques: Verbal cues     General Comments General comments (skin integrity, edema, etc.): Pt was educated on the importance of mobility to help with stiffness/swelling in the bil LE, poor health literacy with difficulty understanding education despite multiple attempts at various ways of education by both PT and OT        Assessment/Plan    PT Assessment Patient needs continued PT services  PT Problem List Decreased mobility;Decreased activity tolerance;Pain;Decreased balance  PT Treatment Interventions DME instruction;Therapeutic activities;Gait training;Therapeutic exercise;Patient/family education;Balance training;Functional mobility training    PT Goals (Current  goals can be found in the Care Plan section)  Acute Rehab PT Goals Patient Stated Goal: to decrease pain and improve stiffness in bil LE PT Goal Formulation: With patient Time For Goal Achievement: 07/03/24 Potential to Achieve Goals: Good    Frequency Min 2X/week        AM-PAC PT 6 Clicks Mobility  Outcome Measure Help needed turning from your back to your side while in a flat bed without using bedrails?: None Help needed moving from lying on your back to sitting on the side of a flat bed without using bedrails?: None Help needed moving to and from a bed to a chair (including a wheelchair)?: A Little Help needed standing up from a chair using your arms (e.g., wheelchair or bedside chair)?: A Little Help needed to walk in hospital room?: Total Help needed climbing 3-5 steps with a railing? : Total 6 Click Score: 16    End of Session   Activity Tolerance: Patient tolerated treatment well;Patient limited by pain Patient left: in bed;with call bell/phone within reach Nurse Communication: Mobility status PT Visit Diagnosis: Other abnormalities of gait and mobility (R26.89);Pain Pain - Right/Left: Left Pain - part of body: Ankle and joints of foot    Time: 8942-8875 PT Time Calculation (min) (ACUTE ONLY): 27 min   Charges:   PT Evaluation $PT Eval Low Complexity: 1 Low   PT General Charges $$ ACUTE PT VISIT: 1 Visit       Dorothyann Maier, DPT, CLT  Acute Rehabilitation Services Office: [phone] (Secure chat preferred)   Dorothyann VEAR Maier 06/19/2024, 1:27 PM

## 2024-06-19 NOTE — ED Notes (Signed)
"  Vascular at bedside.   "

## 2024-06-19 NOTE — Progress Notes (Signed)
 ABI's have been completed. Preliminary results can be found in CV Proc through chart review.   06/19/2024 9:18 AM Cathlyn Collet RVT

## 2024-06-19 NOTE — Op Note (Addendum)
 DATE OF SERVICE: 06/19/2024  PATIENT:  Vincent Baxter.  89 y.o. 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 ulceration  POST-OPERATIVE DIAGNOSIS:  Same  PROCEDURE:   1) Ultrasound guided right common femoral artery access  2) Aortogram with evaluation of the renal arteries 3) Left lower extremity angiogram with second order cannulation  4) Additional left lower extremity with third order cannulation 5) Ultrasound guided retrograde left posterior tibial access at the ankle 6) Complex angioplasty and stenting of the left femoropopliteal artery (6x14mm Eluvia x2) 7) Complex angioplasty of the left tibioperoneal trunk (3x178mm Sterling) 8) Complex angioplasty of the left posterior tibial artery (3x163mm Sterling) 9) Conscious sedation (42 minutes)  SURGEON:  Debby SAILOR. Magda, MD  ASSISTANT: none  ANESTHESIA:   local and IV sedation  ESTIMATED BLOOD LOSS: min  LOCAL MEDICATIONS USED:  LIDOCAINE    COUNTS: confirmed correct.  PATIENT DISPOSITION:  PACU - hemodynamically stable.   Delay start of Pharmacological VTE agent (>24hrs) due to surgical blood loss or risk of bleeding: no  INDICATION FOR PROCEDURE: [name]. is a 89 y.o. male with left great toe ischemic ulceration in the setting of severe peripheral arterial disease. After careful discussion of risks, benefits, and alternatives the patient was offered angiography with possible intervention. The patient understood and wished to proceed.  OPERATIVE FINDINGS:   Globally slow transit of contrast worrisome for heart failure.   Aortogram: Renal arteries Left: >60% stenosis  Right: >60% stenosis Infrarenal aorta: patent Common iliac arteries: Left: patent  Right: patent Internal iliac arteries: Left: patent  Right: patent External iliac arteries: Left: patent  Right: patent  Left Lower Extremity Angiogram:  Common femoral artery: patent  Profunda  femoris artery: two, codominant profunda femoris arteries that are patent  Superficial femoral artery: patent proximally; occluded in mid vessel. Severely diseased throughout the distal vessel. Calcified lesion.   Popliteal artery: reconstitutes above the knee. Severely diseased above and behind the knee. Calcified.   Anterior tibial artery: patent proximally. Occludes in proximal calf. DP reconstitutes in the foot.  Tibioperoneal trunk: occluded  Peroneal artery: occluded  Posterior tibial artery: occluded proximally. Reconstitutes in the calf. Dominant tibial. Fills the foot  Pedal circulation: Disadvantaged  GLASS score. FP: 3. IP: 3  WIfI score. Wound: 2; ischemia: 3; infection: 0. Stage: 4  DESCRIPTION OF PROCEDURE: After identification of the patient in the pre-operative holding area, the patient was transferred to the operating room. The patient was positioned supine on the operating room table. Anesthesia was induced. The groins was prepped and draped in standard fashion. A surgical pause was performed confirming correct patient, procedure, and operative location.  The right groin was anesthetized with subcutaneous injection of 1% lidocaine . Using ultrasound guidance, the right common femoral artery was accessed with micropuncture technique.  Fluoroscopy was used to confirm cannulation over the femoral head. The 49F micropuncture sheath was upsized to 4F.   A Benson wire was advanced into the distal aorta. Over the wire an omni flush catheter was advanced to the level of L2. Aortogram was performed - see above for details.   The left common iliac artery was selected with an omniflush catheter and glidewire advantage guidewire. The wire was advanced into the common femoral artery. Over the wire the omni flush catheter was advanced into the external iliac artery. Selective angiography was performed - see above for details.   The decision was made to intervene. The patient was heparinized  with 6000 units of heparin . The 54F sheath was exchanged for a 70F x 45cm sheath. Selective angiography of the left lower extremity performed prior to intervention.   I was able to cross the femoral-popliteal occlusion antegrade.  Below the knee there was a flush occlusion at the origin of the tibioperoneal trunk which I could not cross antegrade.  Posterior tibial artery at the ankle was accessed in a retrograde fashion using ultrasound access with a micropuncture kit.  After obtaining access, wire was navigated into the posterior tibial artery and a micro sheath introduced into the artery.  An 018 12 g weighted tip shepherd wire was used to cross the posterior tibial and tibioperoneal trunk occlusion.  After confirming our position in the true lumen the 018 wire was snared from the contralateral access with a lasso type snare.  The wire was externalized through the right common femoral artery access sheath.  We then began our intervention.  The posterior tibial artery was angioplastied up to the sheath with a 3 x 150 mm Sterling balloon.  The tibioperoneal trunk was similarly angioplastied with a 3 x 150 mm Sterling balloon.  Two 6 x 150 mm Eluvia stents were deployed in the femoral-popliteal position from above-the-knee to proximal third of SFA.  These were postdilated with a 5 x 150 mm Sterling balloon.  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 Flow to the level of the sheath was achieved.  A Perclose was used to close the arteriotomy in the right common femoral artery. Hemostasis was excellent upon completion. The left posterior tibial sheath was left in place to be removed in the recovery area.  Conscious sedation was administered with the use of IV fentanyl  and midazolam under continuous physician and nurse monitoring.  Heart rate, blood pressure, and  oxygen saturation were continuously monitored.  Total sedation time was 42 minutes  Upon completion of the case instrument and sharps counts were confirmed correct. The patient was transferred to the PACU in good condition. I was present for all portions of the procedure.  PLAN: Aspirin , Plavix, statin therapies.  Optimized from a vascular standpoint.  He is cleared to undergo any kind of foot surgery necessary.  Follow-up with me in 4 weeks with ABI and left lower extremity arterial duplex.  Debby SAILOR. Magda, MD [facility] Phone Number: [phone] 06/19/2024 4:18 PM

## 2024-06-19 NOTE — Consult Note (Signed)
 " Hospital Consult    Reason for Consult: Nonhealing wound on the left great toe Requesting Physician: Emergency department MRN #:  [id number]  History of Present Illness: This is a 89 y.o. male who presents to the hospital with foot pain.  He has a nonhealing wound to the left lower extremity.  Vascular surgery was called as he has had previous ABIs, and had a nonpalpable pulse on exam.  On exam, Vincent Baxter was doing well.  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  He stated the wound has been present for roughly a week, but has been getting notably worse.  Denies fevers, chills.  Denies drainage.  Does note swelling in bilateral lower extremities which preceded lower extremity wound.  States wound is still painful, he is having a difficult time ambulating.  Patient known to the vascular surgery service line-being followed for AAA  Past Medical History:  Diagnosis Date   Diabetes mellitus 01/2009 dx   DYSLIPIDEMIA    GERD    Hypertension    Kidney stones    RENAL CALCULUS 01/04/2009    Past Surgical History:  Procedure Laterality Date   NO PAST SURGERIES      Allergies[1]  Prior to Admission medications  Not on File    Social History   Socioeconomic History   Marital status: Widowed    Spouse name: Not on file   Number of children: Not on file   Years of education: Not on file   Highest education level: Not on file  Occupational History   Not on file  Tobacco Use   Smoking status: Every Day    Current packs/day: 0.50    Types: Cigarettes   Smokeless tobacco: Never   Tobacco comments:    Retired cabin crew, Lives alone, 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   Social Drivers of Health   Tobacco Use: High  Risk (06/18/2024)   Patient History    Smoking Tobacco Use: Every Day    Smokeless Tobacco Use: Never    Passive Exposure: Not on file  Financial Resource Strain: Not on file  Food Insecurity: No Food Insecurity (03/14/2024)   Epic    Worried About Programme Researcher, Broadcasting/film/video in the Last Year: Never true    Ran Out of Food in the Last Year: Never true  Transportation Needs: No Transportation Needs (03/14/2024)   Epic    Lack of Transportation (Medical): No    Lack of Transportation (Non-Medical): No  Physical Activity: Not on file  Stress: Not on file  Social Connections: Unknown (03/14/2024)   Social Connection and Isolation Panel    Frequency of Communication with Friends and Family: Never    Frequency of Social Gatherings with Friends and Family: Never    Attends Religious Services: Never    Database Administrator or Organizations: No    Attends Engineer, Structural: Not on file    Marital Status: Not on file  Recent Concern: Social Connections - Socially Isolated (03/13/2024)   Social Connection and Isolation Panel    Frequency of Communication with Friends and Family: Twice a week    Frequency of Social  Gatherings with Friends and Family: Never    Attends Religious Services: Never    Database Administrator or Organizations: No    Attends Banker Meetings: Never    Marital Status: Never married  Intimate Partner Violence: Not At Risk (03/14/2024)   Epic    Fear of Current or Ex-Partner: No    Emotionally Abused: No    Physically Abused: No    Sexually Abused: No  Depression (PHQ2-9): Not on file  Alcohol Screen: Not on file  Housing: Low Risk (03/14/2024)   Epic    Unable to Pay for Housing in the Last Year: No    Number of Times Moved in the Last Year: 0    Homeless in the Last Year: No  Utilities: Not At Risk (03/14/2024)   Epic    Threatened with loss of utilities: No  Health Literacy: Not on file    Family History  Problem Relation Age of Onset    Hypertension Mother     ROS: Otherwise negative unless mentioned in HPI  Physical Examination  Vitals:   06/19/24 0615 06/19/24 0630  BP: (!) 120/91 (!) 124/93  Pulse: 77 76  Resp: 14 16  Temp:    SpO2: 100% 100%   Body mass index is 17.23 kg/m.  General:  WDWN in NAD Gait: Not observed HENT: WNL, normocephalic Pulmonary: normal non-labored breathing, without Rales, rhonchi,  wheezing Cardiac: regular Abdomen:  soft, NT/ND, no masses Skin: without rashes Vascular Exam/Pulses: Palpable femoral pulses, nonpalpable pedal pulses Extremities: with ischemic changes, without Gangrene , without cellulitis; with open wounds;  Wound appears to the level of the muscle.  I do not see any bone Musculoskeletal: no muscle wasting or atrophy  Neurologic: A&O X 3;  No focal weakness or paresthesias are detected; speech is fluent/normal Psychiatric:  The pt has Normal affect. Lymph:  Unremarkable  CBC    Component Value Date/Time   WBC 7.9 06/19/2024 0404   RBC 3.93 (L) 06/19/2024 0404   HGB 13.3 06/19/2024 0404   HCT 38.3 (L) 06/19/2024 0404   PLT 128 (L) 06/19/2024 0404   MCV 97.5 06/19/2024 0404   MCH 33.8 06/19/2024 0404   MCHC 34.7 06/19/2024 0404   RDW 14.6 06/19/2024 0404   LYMPHSABS 0.7 06/18/2024 1601   MONOABS 0.5 06/18/2024 1601   EOSABS 0.0 06/18/2024 1601   BASOSABS 0.0 06/18/2024 1601    BMET    Component Value Date/Time   NA 142 06/19/2024 0404   K 4.9 06/19/2024 0404   CL 107 06/19/2024 0404   CO2 24 06/19/2024 0404   GLUCOSE 80 06/19/2024 0404   BUN 38 (H) 06/19/2024 0404   CREATININE 1.63 (H) 06/19/2024 0404   CALCIUM  9.1 06/19/2024 0404   GFRNONAA 41 (L) 06/19/2024 0404   GFRAA  12/12/2009 2237    >60        The eGFR has been calculated using the MDRD equation. This calculation has not been validated in all clinical situations. eGFR's persistently <60 mL/min signify possible Chronic Kidney Disease.    COAGS: Lab Results  Component Value  Date   INR 1.0 03/12/2024      ASSESSMENT/PLAN: This is a 89 y.o. male with left lower extremity foot wound prior ABI demonstrates toe pressure of 0.  Vincent Baxter has critical limb ischemia with tissue loss of the left great toe.  After discussing the risks and benefits of left lower extremity angiogram in an effort to define and improve distal perfusion  for wound healing, have elected to proceed.  Plan will hopefully be for angiography today pending availability.  He can have breakfast, but will need to be n.p.o. thereafter.   Fonda FORBES Rim MD [facility] [phone] 06/19/2024  7:26 AM     [1] No Known Allergies  "

## 2024-06-19 NOTE — ED Notes (Signed)
Transported to Cath Lab .  

## 2024-06-19 NOTE — ED Notes (Signed)
 Cbg 76, pt given juice to drink.

## 2024-06-19 NOTE — Progress Notes (Signed)
 " PROGRESS NOTE    [name]  FMW:[id number] DOB: 05/06/1935 DOA: 06/18/2024 PCP: [facility]  Outpatient Specialists:     Brief Narrative:  Patient is an 89 year old male past medical history significant for systolic CHF with EF of 25 to 69%, grade 2 diastolic dysfunction, COPD, type 2 diabetes mellitus, renal mass, nephrolithiasis, abdominal aortic aneurysm, chronic kidney disease stage IIIb, anemia, GERD, BPH, THC and tobacco abuse.  Patient was admitted with left lower extremity peripheral artery disease, and left foot ulcer involving the plantar aspect of first left toe.  06/19/2024: Patient seen.  No new complaints.  Vascular surgery input is appreciated.  Patient underwent aortogram with evaluation of the renal arteries etc.  As per vascular surgery procedure note: PRE-OPERATIVE DIAGNOSIS:  Atherosclerosis of native arteries of left lower extremity causing ulceration   POST-OPERATIVE DIAGNOSIS:  Same   PROCEDURE:   1) Ultrasound guided right common femoral artery access  2) Aortogram with evaluation of the renal arteries 3) Left lower extremity angiogram with second order cannulation  4) Additional left lower extremity with third order cannulation 5) Ultrasound guided retrograde left posterior tibial access at the ankle 6) Complex angioplasty and stenting of the left femoropopliteal artery (6x112mm Eluvia x2) 7) Complex angioplasty of the left tibioperoneal trunk (3x157mm Sterling) 8) Complex angioplasty of the left posterior tibial artery (3x131mm Sterling) 9) Conscious sedation (42 minutes)  SURGEON:  Debby SAILOR. Magda, MD   ASSISTANT: none   ANESTHESIA:   local and IV sedation   ESTIMATED BLOOD LOSS: min   LOCAL MEDICATIONS USED:  LIDOCAINE     COUNTS: confirmed correct.   PATIENT DISPOSITION:  PACU - hemodynamically stable.   Delay start of Pharmacological VTE agent (>24hrs) due to surgical blood loss or risk of bleeding: no   INDICATION FOR  PROCEDURE: Chesky Heyer. is a 89 y.o. male with left great toe ischemic ulceration in the setting of severe peripheral arterial disease. After careful discussion of risks, benefits, and alternatives the patient was offered angiography with possible intervention. The patient understood and wished to proceed.   OPERATIVE FINDINGS:    Globally slow transit of contrast worrisome for heart failure.    Aortogram: Renal arteries Left: >60% stenosis     Right: >60% stenosis Infrarenal aorta: patent Common iliac arteries: Left: patent       Right: patent Internal iliac arteries: Left: patent       Right: patent External iliac arteries: Left: patent       Right: patent   Left Lower Extremity Angiogram:             Common femoral artery: patent             Profunda femoris artery: two, codominant profunda femoris arteries that are patent             Superficial femoral artery: patent proximally; occluded in mid vessel. Severely diseased throughout the distal vessel. Calcified lesion.              Popliteal artery: reconstitutes above the knee. Severely diseased above and behind the knee. Calcified.              Anterior tibial artery: patent proximally. Occludes in proximal calf. DP reconstitutes in the foot.             Tibioperoneal trunk: occluded             Peroneal artery: occluded  Posterior tibial artery: occluded proximally. Reconstitutes in the calf. Dominant tibial. Fills the foot             Pedal circulation: Disadvantaged   GLASS score. FP: 3. IP: 3   WIfI score. Wound: 2; ischemia: 3; infection: 0. Stage: 4   DESCRIPTION OF PROCEDURE: After identification of the patient in the pre-operative holding area, the patient was transferred to the operating room. The patient was positioned supine on the operating room table. Anesthesia was induced. The groins was prepped and draped in standard fashion. A surgical pause was performed confirming correct patient,  procedure, and operative location.   The right groin was anesthetized with subcutaneous injection of 1% lidocaine . Using ultrasound guidance, the right common femoral artery was accessed with micropuncture technique.  Fluoroscopy was used to confirm cannulation over the femoral head. The 22F micropuncture sheath was upsized to 19F.    A Benson wire was advanced into the distal aorta. Over the wire an omni flush catheter was advanced to the level of L2. Aortogram was performed - see above for details.    The left common iliac artery was selected with an omniflush catheter and glidewire advantage guidewire. The wire was advanced into the common femoral artery. Over the wire the omni flush catheter was advanced into the external iliac artery. Selective angiography was performed - see above for details.    The decision was made to intervene. The patient was heparinized with 6000 units of heparin . The 19F sheath was exchanged for a 70F x 45cm sheath. Selective angiography of the left lower extremity performed prior to intervention.    I was able to cross the femoral-popliteal occlusion antegrade.  Below the knee there was a flush occlusion at the origin of the tibioperoneal trunk which I could not cross antegrade.   Posterior tibial artery at the ankle was accessed in a retrograde fashion using ultrasound access with a micropuncture kit.  After obtaining access, wire was navigated into the posterior tibial artery and a micro sheath introduced into the artery.  An 018 12 g weighted tip shepherd wire was used to cross the posterior tibial and tibioperoneal trunk occlusion.   After confirming our position in the true lumen the 018 wire was snared from the contralateral access with a lasso type snare.  The wire was externalized through the right common femoral artery access sheath.   We then began our intervention.  The posterior tibial artery was angioplastied up to the sheath with a 3 x 150 mm Sterling balloon.   The tibioperoneal trunk was similarly angioplastied with a 3 x 150 mm Sterling balloon.  Two 6 x 150 mm Eluvia stents were deployed in the femoral-popliteal position from above-the-knee to proximal third of SFA.  These were postdilated with a 5 x 150 mm Sterling balloon.   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 Flow to the level of the sheath was achieved.   A Perclose was used to close the arteriotomy in the right common femoral artery. Hemostasis was excellent upon completion. The left posterior tibial sheath was left in place to be removed in the recovery area.   Conscious sedation was administered with the use of IV fentanyl  and midazolam under continuous physician and nurse monitoring.  Heart rate, blood pressure, and oxygen saturation were continuously monitored.  Total sedation time was 42 minutes   Upon completion of the case instrument  and sharps counts were confirmed correct. The patient was transferred to the PACU in good condition. I was present for all portions of the procedure.   PLAN: Aspirin , Plavix, statin therapies.  Optimized from a vascular standpoint.  He is cleared to undergo any kind of foot surgery necessary.  Follow-up with me in 4 weeks with ABI and left lower extremity arterial duplex.   Debby SAILOR. Magda, MD FACS [facility] Phone Number: [phone] 06/19/2024 4:18 PM   Assessment & Plan:   Principal Problem:   Diabetic foot ulcer (HCC) Active Problems:   DM2 (diabetes mellitus, type 2) (HCC)   Essential hypertension   COPD (chronic obstructive pulmonary disease) with emphysema (HCC)   Elevated troponin   Tobacco abuse   CKD (chronic kidney disease) stage 3, GFR 30-59 ml/min (HCC)   Acute on chronic systolic CHF (congestive heart failure) (HCC)   Present on Admission:   Diabetic foot ulcer (HCC)  CKD (chronic kidney disease) stage 3, GFR 30-59 ml/min (HCC)  COPD (chronic obstructive pulmonary disease) with emphysema (HCC)  Elevated troponin  Essential hypertension  Acute on chronic systolic CHF (congestive heart failure) (HCC)  Tobacco abuse   CKD (chronic kidney disease) stage 3b: - Renal function is stable. - Patient has been exposed to contrast dye. - Patient is at risk for contrast associated nephropathy. - Will hold furosemide . - Avoid nephrotoxins whenever possible. - Dose all medications, considering impaired renal function. - Keep MAP greater than 65 mmHg. - Monitor renal function and electrolytes daily.    COPD (chronic obstructive pulmonary disease) with emphysema (HCC) Chronic stable will continue home medications   Ischemic/diabetic foot ulcer (HCC): -Plain images showed no evidence of osteomyelitis. - See vascular surgery procedure and documentation above. - Low threshold to consult the tabetic surgery team to assess for possible amputation. - Continue IV vancomycin and Unasyn.     DM2 (diabetes mellitus, type 2) (HCC) Sliding scale insulin  as per protocol.   Elevated troponin Chronic stable patient denies any chest pain    Essential hypertension Plan continue to monitor closely.   Acute on chronic systolic CHF (congestive heart failure) (HCC) - Seems compensated. - Will hold Lasix .  Patient has been exposed to contrast dye.   Tobacco abuse  - Counseled to quit.   DVT prophylaxis: Start subcutaneous heparin  tomorrow.  Patient has received significant amount of heparin  during vascular procedure today. Code Status: Full code Family Communication:  Disposition Plan:    Consultants:  Vascular surgery Will likely consult orthopedic surgery to assess need for amputation.  Procedures:  Vascular procedure (see above)  Antimicrobials:  IV vancomycin and Unasyn.   Subjective: No new complaints.  Objective: Vitals:    06/19/24 1300 06/19/24 1426 06/19/24 1700 06/19/24 1800  BP: (!) 150/114  (!) 147/120 (!) 157/120  Pulse: 81   88  Resp:    15  Temp:    (!) 97.3 F (36.3 C)  TempSrc:    Oral  SpO2:  98%  100%  Weight:      Height:        Intake/Output Summary (Last 24 hours) at 06/19/2024 1912 Last data filed at 06/19/2024 1300 Gross per 24 hour  Intake 3 ml  Output 1275 ml  Net -1272 ml   Filed Weights   06/18/24 1514  Weight: 49.9 kg    Examination:  General exam: Appears calm and comfortable  Respiratory system: Clear to auscultation. Respiratory effort normal. Cardiovascular system: S1 &  S2 heard Gastrointestinal system: Abdomen is nondistended, soft and nontender.  Central nervous system: Alert and oriented.  Extremities: Ischemic ulcer plantar surface of left first toe.    Data Reviewed: I have personally reviewed following labs and imaging studies  CBC: Recent Labs  Lab 06/18/24 1601 06/19/24 0404  WBC 7.0 7.9  NEUTROABS 5.7  --   HGB 12.7* 13.3  HCT 37.1* 38.3*  MCV 98.1 97.5  PLT 131* 128*   Basic Metabolic Panel: Recent Labs  Lab 06/18/24 1601 06/19/24 0404  NA 141 142  K 5.1 4.9  CL 110 107  CO2 19* 24  GLUCOSE 186* 80  BUN 37* 38*  CREATININE 1.59* 1.63*  CALCIUM  8.9 9.1  MG 1.9 1.8  PHOS 3.1 2.6   GFR: Estimated Creatinine Clearance: 22.5 mL/min (A) (by C-G formula based on SCr of 1.63 mg/dL (H)). Liver Function Tests: Recent Labs  Lab 06/19/24 0404  AST 24  ALT 32  ALKPHOS 128*  BILITOT 0.6  PROT 6.5  ALBUMIN 4.0   No results for input(s): LIPASE, AMYLASE in the last 168 hours. No results for input(s): AMMONIA in the last 168 hours. Coagulation Profile: No results for input(s): INR, PROTIME in the last 168 hours. Cardiac Enzymes: Recent Labs  Lab 06/18/24 1601  CKTOTAL 72   BNP (last 3 results) Recent Labs    06/18/24 1601  PROBNP 23,026.0*   HbA1C: Recent Labs    06/18/24 1601  HGBA1C 6.8*   CBG: Recent Labs   Lab 06/18/24 2030 06/18/24 2337 06/19/24 0410 06/19/24 0828 06/19/24 1215  GLUCAP 130* 183* 76 125* 128*   Lipid Profile: No results for input(s): CHOL, HDL, LDLCALC, TRIG, CHOLHDL, LDLDIRECT in the last 72 hours. Thyroid  Function Tests: Recent Labs    06/18/24 1601  TSH 1.440  FREET4 1.32   Anemia Panel: Recent Labs    06/18/24 1601 06/18/24 2200 06/19/24 0404  VITAMINB12  --   --  642  FOLATE  --   --  4.9*  FERRITIN  --  167  --   TIBC  --  242*  --   IRON  --  63  --   RETICCTPCT 1.4  --   --    Urine analysis:    Component Value Date/Time   COLORURINE YELLOW 05/31/2024 1803   APPEARANCEUR CLEAR 05/31/2024 1803   LABSPEC 1.013 05/31/2024 1803   PHURINE 5.0 05/31/2024 1803   GLUCOSEU NEGATIVE 05/31/2024 1803   GLUCOSEU NEGATIVE 02/11/2012 0931   HGBUR SMALL (A) 05/31/2024 1803   BILIRUBINUR NEGATIVE 05/31/2024 1803   KETONESUR NEGATIVE 05/31/2024 1803   PROTEINUR 30 (A) 05/31/2024 1803   UROBILINOGEN 0.2 06/20/2022 1043   NITRITE NEGATIVE 05/31/2024 1803   LEUKOCYTESUR NEGATIVE 05/31/2024 1803   Sepsis Labs: @LABRCNTIP (procalcitonin:4,lacticidven:4)  )No results found for this or any previous visit (from the past 240 hours).       Radiology Studies: PERIPHERAL VASCULAR CATHETERIZATION Result Date: 06/19/2024 DATE OF SERVICE: 06/19/2024  PATIENT:  [name]  89 y.o. 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 ulceration  POST-OPERATIVE DIAGNOSIS:  Same  PROCEDURE:  1) Ultrasound guided right common femoral artery access 2) Aortogram with evaluation of the renal arteries 3) Left lower extremity angiogram with second order cannulation 4) Additional left lower extremity with third order cannulation 5) Ultrasound guided retrograde left posterior tibial access at the ankle 6) Complex angioplasty and stenting of the left femoropopliteal artery (6x179mm Eluvia x2)  7) Complex angioplasty of  the left tibioperoneal trunk (3x134mm Sterling) 8) Complex angioplasty of the left posterior tibial artery (3x165mm Sterling) 9) Conscious sedation (42 minutes)  SURGEON:  Debby SAILOR. Magda, MD  ASSISTANT: none  ANESTHESIA:   local and IV sedation  ESTIMATED BLOOD LOSS: min  LOCAL MEDICATIONS USED:  LIDOCAINE   COUNTS: confirmed correct.  PATIENT DISPOSITION:  PACU - hemodynamically stable.  Delay start of Pharmacological VTE agent (>24hrs) due to surgical blood loss or risk of bleeding: no  INDICATION FOR PROCEDURE: Gahel Safley. is a 89 y.o. male with left great toe ischemic ulceration in the setting of severe peripheral arterial disease. After careful discussion of risks, benefits, and alternatives the patient was offered angiography with possible intervention. The patient understood and wished to proceed.  OPERATIVE FINDINGS:  Globally slow transit of contrast worrisome for heart failure.  Aortogram: Renal arteries Left: >60% stenosis    Right: >60% stenosis Infrarenal aorta: patent Common iliac arteries: Left: patent      Right: patent Internal iliac arteries: Left: patent      Right: patent External iliac arteries: Left: patent      Right: patent  Left Lower Extremity Angiogram:             Common femoral artery: patent             Profunda femoris artery: two, codominant profunda femoris arteries that are patent             Superficial femoral artery: patent proximally; occluded in mid vessel. Severely diseased throughout the distal vessel. Calcified lesion.             Popliteal artery: reconstitutes above the knee. Severely diseased above and behind the knee. Calcified.             Anterior tibial artery: patent proximally. Occludes in proximal calf. DP reconstitutes in the foot.             Tibioperoneal trunk: occluded             Peroneal artery: occluded             Posterior tibial artery: occluded proximally. Reconstitutes in the calf. Dominant tibial. Fills the foot             Pedal circulation:  Disadvantaged  GLASS score. FP: 3. IP: 3  WIfI score. Wound: 2; ischemia: 3; infection: 0. Stage: 4  DESCRIPTION OF PROCEDURE: After identification of the patient in the pre-operative holding area, the patient was transferred to the operating room. The patient was positioned supine on the operating room table. Anesthesia was induced. The groins was prepped and draped in standard fashion. A surgical pause was performed confirming correct patient, procedure, and operative location.  The right groin was anesthetized with subcutaneous injection of 1% lidocaine . Using ultrasound guidance, the right common femoral artery was accessed with micropuncture technique.  Fluoroscopy was used to confirm cannulation over the femoral head. The 63F micropuncture sheath was upsized to 56F.  A Benson wire was advanced into the distal aorta. Over the wire an omni flush catheter was advanced to the level of L2. Aortogram was performed - see above for details.  The left common iliac artery was selected with an omniflush catheter and glidewire advantage guidewire. The wire was advanced into the common femoral artery. Over the wire the omni flush catheter was advanced into the external iliac artery. Selective angiography was performed - see above for details.  The decision was  made to intervene. The patient was heparinized with 6000 units of heparin . The 51F sheath was exchanged for a 51F x 45cm sheath. Selective angiography of the left lower extremity performed prior to intervention.  I was able to cross the femoral-popliteal occlusion antegrade.  Below the knee there was a flush occlusion at the origin of the tibioperoneal trunk which I could not cross antegrade.  Posterior tibial artery at the ankle was accessed in a retrograde fashion using ultrasound access with a micropuncture kit.  After obtaining access, wire was navigated into the posterior tibial artery and a micro sheath introduced into the artery.  An 018 12 g weighted tip shepherd  wire was used to cross the posterior tibial and tibioperoneal trunk occlusion.  After confirming our position in the true lumen the 018 wire was snared from the contralateral access with a lasso type snare.  The wire was externalized through the right common femoral artery access sheath.  We then began our intervention.  The posterior tibial artery was angioplastied up to the sheath with a 3 x 150 mm Sterling balloon.  The tibioperoneal trunk was similarly angioplastied with a 3 x 150 mm Sterling balloon.  Two 6 x 150 mm Eluvia stents were deployed in the femoral-popliteal position from above-the-knee to proximal third of SFA.  These were postdilated with a 5 x 150 mm Sterling balloon.  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 Flow to the level of the sheath was achieved.  A Perclose was used to close the arteriotomy in the right common femoral artery. Hemostasis was excellent upon completion. The left posterior tibial sheath was left in place to be removed in the recovery area.  Conscious sedation was administered with the use of IV fentanyl  and midazolam under continuous physician and nurse monitoring.  Heart rate, blood pressure, and oxygen saturation were continuously monitored.  Total sedation time was 42 minutes  Upon completion of the case instrument and sharps counts were confirmed correct. The patient was transferred to the PACU in good condition. I was present for all portions of the procedure.  PLAN: Aspirin , Plavix, statin therapies.  Optimized from a vascular standpoint.  He is cleared to undergo any kind of foot surgery necessary.  Follow-up with me in 4 weeks with ABI and left lower extremity arterial duplex.  Debby SAILOR. Magda, MD [facility] Phone Number: [phone] 06/19/2024 4:18 PM    VAS US  ABI WITH/WO  TBI Result Date: 06/19/2024  LOWER EXTREMITY DOPPLER STUDY Patient Name:  Mouhamed KIERNAN ATKERSON.  Date of Exam:   06/19/2024 Medical Rec #: [id number]           Accession #:    [id number] Date of Birth: 22-Oct-1934          Patient Gender: M Patient Age:   30 years Exam Location:  [facility] Procedure:      VAS US  ABI WITH/WO TBI Referring Phys: ANASTASSIA DOUTOVA --------------------------------------------------------------------------------  Indications: Ulceration. High Risk Factors: Hypertension, Diabetes, current smoker.  Limitations: Today's exam was limited due to an open wound and involuntary              patient movement. Comparison Study: 03/13/2024 - Right: Resting right ankle-brachial index is                   within normal range. The  right toe-brachial index is abnormal.                    Left: Resting left ankle-brachial index indicates                   noncompressible left                   lower extremity arteries. The left toe-brachial index is                   absent. Performing Technologist: Gerome Ny RVT  Examination Guidelines: A complete evaluation includes at minimum, Doppler waveform signals and systolic blood pressure reading at the level of bilateral brachial, anterior tibial, and posterior tibial arteries, when vessel segments are accessible. Bilateral testing is considered an integral part of a complete examination. Photoelectric Plethysmograph (PPG) waveforms and toe systolic pressure readings are included as required and additional duplex testing as needed. Limited examinations for reoccurring indications may be performed as noted.  ABI Findings: +---------+------------------+-----+----------+--------+ Right    Rt Pressure (mmHg)IndexWaveform  Comment  +---------+------------------+-----+----------+--------+ Brachial 136                    triphasic          +---------+------------------+-----+----------+--------+ PTA      131               0.96  monophasic         +---------+------------------+-----+----------+--------+ DP       129               0.95 monophasic         +---------+------------------+-----+----------+--------+ Great Toe63                0.46                    +---------+------------------+-----+----------+--------+ +---------+------------------+-----+----------+-------+ Left     Lt Pressure (mmHg)IndexWaveform  Comment +---------+------------------+-----+----------+-------+ Brachial 130                    triphasic         +---------+------------------+-----+----------+-------+ PTA      150               1.10 monophasic        +---------+------------------+-----+----------+-------+ DP       78                0.57 monophasic        +---------+------------------+-----+----------+-------+ Great Toe                       Absent    Ulcer   +---------+------------------+-----+----------+-------+ +-------+-----------+-----------+------------+------------+ ABI/TBIToday's ABIToday's TBIPrevious ABIPrevious TBI +-------+-----------+-----------+------------+------------+ Right  0.96       0.46                                +-------+-----------+-----------+------------+------------+ Left   1.1                                            +-------+-----------+-----------+------------+------------+   Summary: Right: Resting right ankle-brachial index is within normal range. The right toe-brachial index is abnormal.  ABI's are likely falsely elevated due to medial calcification. Left: Resting left ankle-brachial index is within normal range.  ABI's  are likely falsely elevated due to medial calcification. Unable to obtain TBI due to great toe ulceration. *See table(s) above for measurements and observations.     Preliminary    DG Chest 2 View Result Date: 06/18/2024 CLINICAL DATA:  Cellulitis.  Weakness. EXAM: CHEST - 2 VIEW; RIGHT FOOT COMPLETE - 3+ VIEW COMPARISON:  03/12/2024, 03/23/2022.  FINDINGS: Chest: The heart size and mediastinal contours are within normal limits. There is atherosclerotic calcification of the aorta. Stable interstitial prominence and emphysematous changes are noted bilaterally. There is mild atelectasis or scarring at the left lung base. No effusion or pneumothorax is seen. No acute osseous abnormality. Right foot: No acute fracture or dislocation is seen. There is hallux valgus deformity with degenerative changes at the first metatarsophalangeal joint. Degenerative changes are present in the midfoot. There is diffuse soft tissue swelling. Vascular calcifications are noted in the soft tissues. IMPRESSION: 1. Stable chest with no acute cardiopulmonary process. 2. No acute osseous abnormality in the right foot. Electronically Signed   By: Leita Birmingham M.D.   On: 06/18/2024 16:54   DG Foot Complete Right Result Date: 06/18/2024 CLINICAL DATA:  Cellulitis.  Weakness. EXAM: CHEST - 2 VIEW; RIGHT FOOT COMPLETE - 3+ VIEW COMPARISON:  03/12/2024, 03/23/2022. FINDINGS: Chest: The heart size and mediastinal contours are within normal limits. There is atherosclerotic calcification of the aorta. Stable interstitial prominence and emphysematous changes are noted bilaterally. There is mild atelectasis or scarring at the left lung base. No effusion or pneumothorax is seen. No acute osseous abnormality. Right foot: No acute fracture or dislocation is seen. There is hallux valgus deformity with degenerative changes at the first metatarsophalangeal joint. Degenerative changes are present in the midfoot. There is diffuse soft tissue swelling. Vascular calcifications are noted in the soft tissues. IMPRESSION: 1. Stable chest with no acute cardiopulmonary process. 2. No acute osseous abnormality in the right foot. Electronically Signed   By: Leita Birmingham M.D.   On: 06/18/2024 16:54   DG Foot Complete Left Result Date: 06/18/2024 CLINICAL DATA:  Cellulitis. EXAM: LEFT FOOT - COMPLETE 3+ VIEW  COMPARISON:  Left foot radiograph dated 03/23/2022. FINDINGS: No acute fracture or dislocation. The bones are osteopenic. No periosteal elevation or bone erosion. The soft tissue swelling of the foot. No acute pathology or soft tissue gas. Vascular calcification. IMPRESSION: 1. No acute fracture or dislocation. 2. Soft tissue swelling of the foot. Electronically Signed   By: Vanetta Chou M.D.   On: 06/18/2024 16:50        Scheduled Meds:  aspirin  EC  325 mg Oral Daily   atorvastatin   40 mg Oral Daily   clopidogrel  300 mg Oral Once   Followed by   [START ON 06/20/2024] clopidogrel  75 mg Oral Q breakfast   furosemide   40 mg Intravenous Daily   insulin  aspart  0-9 Units Subcutaneous Q4H   sodium chloride  flush  3 mL Intravenous Q12H   [START ON 06/20/2024] sodium chloride  flush  3 mL Intravenous Q12H   Continuous Infusions:  sodium chloride  250 mL (06/19/24 1408)   [START ON 06/20/2024] sodium chloride      sodium chloride      ampicillin-sulbactam (UNASYN) IV Stopped (06/19/24 0915)   vancomycin Stopped (06/18/24 2225)     LOS: 1 day    Time spent: 55 minutes.    Leatrice Chapel, MD  [facility] Pager #: [phone] 7PM-7AM contact night coverage as above    "

## 2024-06-19 NOTE — ED Notes (Signed)
 Patient worked with OT & PT. Patient able to move from lying to sitting but unwilling to stand even with assistance. Patient is adamant that he needs intensive rehab & strength training in order to stand.

## 2024-06-19 NOTE — Evaluation (Signed)
 Occupational Therapy Evaluation Patient Details Name: Vincent Baxter. MRN: [id number] DOB: 10/21/1934 Today's Date: 06/19/2024   History of Present Illness   89 yo M adm 06/18/24 with B LE swelling, L great toe wound x 1 week PMH: HTN, COPD, T2DM, renal mass, nephrolithiasis, AAA, CKD, anemia, GERD, THC and tobacco use, BPH, CHF with EF 25-30%.     Clinical Impressions Pt was independent and living alone prior to admission. Presents with B LE stiffness and reports weakness. Pt limiting session to EOB only. Declined attempt to stand or ambulate. Pt appears to have poor medical literacy and decreased problem solving. He appears with soiled shirt and is very thin. Pt's is grateful and welcoming one minute and then he can be easily frustrated and agitated when trying to relay his concerns. Patient will benefit from continued inpatient follow up therapy, <3 hours/day depending on progress once he mobilizes.      If plan is discharge home, recommend the following:   A little help with walking and/or transfers;A little help with bathing/dressing/bathroom;Assistance with cooking/housework;Assist for transportation;Help with stairs or ramp for entrance     Functional Status Assessment   Patient has had a recent decline in their functional status and demonstrates the ability to make significant improvements in function in a reasonable and predictable amount of time.     Equipment Recommendations   BSC/3in1;Tub/shower seat;Other (comment) (RW)     Recommendations for Other Services         Precautions/Restrictions   Precautions Precautions: Fall Restrictions Weight Bearing Restrictions Per Provider Order: No     Mobility Bed Mobility Overal bed mobility: Modified Independent                  Transfers                   General transfer comment: pt declined attempt to stand citing stiffness in LEs      Balance Overall balance assessment: Needs  assistance   Sitting balance-Leahy Scale: Good                                     ADL either performed or assessed with clinical judgement   ADL Overall ADL's : Needs assistance/impaired Eating/Feeding: Independent;Bed level   Grooming: Set up;Sitting   Upper Body Bathing: Set up;Sitting   Lower Body Bathing: Set up;Sitting/lateral leans   Upper Body Dressing : Set up;Sitting   Lower Body Dressing: Set up;Sitting/lateral leans       Toileting- Clothing Manipulation and Hygiene: Set up;Bed level               Vision Ability to See in Adequate Light: 0 Adequate Patient Visual Report: No change from baseline       Perception         Praxis         Pertinent Vitals/Pain Pain Assessment Pain Assessment: Faces Faces Pain Scale: Hurts little more Pain Location: B lower legs Pain Descriptors / Indicators: Other (Comment) (stiffness) Pain Intervention(s): Monitored during session     Extremity/Trunk Assessment Upper Extremity Assessment Upper Extremity Assessment: Overall WFL for tasks assessed;Right hand dominant   Lower Extremity Assessment Lower Extremity Assessment: Defer to PT evaluation   Cervical / Trunk Assessment Cervical / Trunk Assessment: Normal   Communication Communication Communication: No apparent difficulties   Cognition Arousal: Alert Behavior During Therapy: WFL for tasks assessed/performed Cognition:  No family/caregiver present to determine baseline             OT - Cognition Comments: pt is a difficult interview, poor problem solving and medical literacy                 Following commands: Intact       Cueing  General Comments   Cueing Techniques: Verbal cues      Exercises     Shoulder Instructions      Home Living Family/patient expects to be discharged to:: Private residence Living Arrangements: Alone Available Help at Discharge: Family;Available PRN/intermittently (nephew) Type of  Home: House Home Access: Level entry     Home Layout: One level     Bathroom Shower/Tub: Chief Strategy Officer: Standard     Home Equipment: None          Prior Functioning/Environment Prior Level of Function : Independent/Modified Independent;Driving                    OT Problem List: Decreased strength;Decreased knowledge of use of DME or AE;Pain;Decreased cognition   OT Treatment/Interventions: Self-care/ADL training;Therapeutic activities;DME and/or AE instruction;Patient/family education;Balance training;Cognitive remediation/compensation      OT Goals(Current goals can be found in the care plan section)   Acute Rehab OT Goals OT Goal Formulation: With patient Time For Goal Achievement: 07/03/24 Potential to Achieve Goals: Good ADL Goals Pt Will Perform Grooming: with modified independence;standing Pt Will Perform Lower Body Bathing: with modified independence;sit to/from stand Pt Will Perform Lower Body Dressing: with modified independence;sit to/from stand Pt Will Transfer to Toilet: with modified independence;ambulating Pt Will Perform Toileting - Clothing Manipulation and hygiene: with modified independence;sit to/from stand Pt Will Perform Tub/Shower Transfer: Tub transfer;with modified independence;rolling walker;shower seat   OT Frequency:  Min 2X/week    Co-evaluation              AM-PAC OT 6 Clicks Daily Activity     Outcome Measure Help from another person eating meals?: A Little Help from another person taking care of personal grooming?: A Little Help from another person toileting, which includes using toliet, bedpan, or urinal?: A Little Help from another person bathing (including washing, rinsing, drying)?: A Little Help from another person to put on and taking off regular upper body clothing?: A Little Help from another person to put on and taking off regular lower body clothing?: A Little 6 Click Score: 18   End  of Session    Activity Tolerance: Patient limited by pain Patient left: in bed;with call bell/phone within reach  OT Visit Diagnosis: Pain;Muscle weakness (generalized) (M62.81);Other symptoms and signs involving cognitive function                Time: 8941-8875 OT Time Calculation (min): 26 min Charges:  OT General Charges $OT Visit: 1 Visit OT Evaluation $OT Eval Moderate Complexity: 1 Mod  Mliss HERO, OTR/L Acute Rehabilitation Services Office: [phone]   Kennth Mliss Helling 06/19/2024, 12:44 PM

## 2024-06-20 ENCOUNTER — Inpatient Hospital Stay (HOSPITAL_COMMUNITY)

## 2024-06-20 DIAGNOSIS — Z9889 Other specified postprocedural states: Secondary | ICD-10-CM | POA: Diagnosis not present

## 2024-06-20 DIAGNOSIS — E08621 Diabetes mellitus due to underlying condition with foot ulcer: Secondary | ICD-10-CM

## 2024-06-20 DIAGNOSIS — Z95828 Presence of other vascular implants and grafts: Secondary | ICD-10-CM | POA: Diagnosis not present

## 2024-06-20 DIAGNOSIS — L97403 Non-pressure chronic ulcer of unspecified heel and midfoot with necrosis of muscle: Secondary | ICD-10-CM | POA: Diagnosis not present

## 2024-06-20 LAB — LIPID PANEL
Cholesterol: 151 mg/dL (ref 0–200)
HDL: 53 mg/dL
LDL Cholesterol: 87 mg/dL (ref 0–99)
Total CHOL/HDL Ratio: 2.9 ratio
Triglycerides: 58 mg/dL
VLDL: 12 mg/dL (ref 0–40)

## 2024-06-20 LAB — CBC WITH DIFFERENTIAL/PLATELET
Abs Immature Granulocytes: 0.03 K/uL (ref 0.00–0.07)
Basophils Absolute: 0 K/uL (ref 0.0–0.1)
Basophils Relative: 0 %
Eosinophils Absolute: 0.1 K/uL (ref 0.0–0.5)
Eosinophils Relative: 1 %
HCT: 33.4 % — ABNORMAL LOW (ref 39.0–52.0)
Hemoglobin: 11.9 g/dL — ABNORMAL LOW (ref 13.0–17.0)
Immature Granulocytes: 0 %
Lymphocytes Relative: 9 %
Lymphs Abs: 0.7 K/uL (ref 0.7–4.0)
MCH: 34.2 pg — ABNORMAL HIGH (ref 26.0–34.0)
MCHC: 35.6 g/dL (ref 30.0–36.0)
MCV: 96 fL (ref 80.0–100.0)
Monocytes Absolute: 0.6 K/uL (ref 0.1–1.0)
Monocytes Relative: 7 %
Neutro Abs: 6.9 K/uL (ref 1.7–7.7)
Neutrophils Relative %: 83 %
Platelets: 128 K/uL — ABNORMAL LOW (ref 150–400)
RBC: 3.48 MIL/uL — ABNORMAL LOW (ref 4.22–5.81)
RDW: 14.2 % (ref 11.5–15.5)
WBC: 8.3 K/uL (ref 4.0–10.5)
nRBC: 0 % (ref 0.0–0.2)

## 2024-06-20 LAB — RENAL FUNCTION PANEL
Albumin: 3.6 g/dL (ref 3.5–5.0)
Albumin: 3.7 g/dL (ref 3.5–5.0)
Anion gap: 15 (ref 5–15)
Anion gap: 10 (ref 5–15)
BUN: 41 mg/dL — ABNORMAL HIGH (ref 8–23)
BUN: 42 mg/dL — ABNORMAL HIGH (ref 8–23)
CO2: 20 mmol/L — ABNORMAL LOW (ref 22–32)
CO2: 25 mmol/L (ref 22–32)
Calcium: 8.6 mg/dL — ABNORMAL LOW (ref 8.9–10.3)
Calcium: 8.8 mg/dL — ABNORMAL LOW (ref 8.9–10.3)
Chloride: 103 mmol/L (ref 98–111)
Chloride: 103 mmol/L (ref 98–111)
Creatinine, Ser: 1.73 mg/dL — ABNORMAL HIGH (ref 0.61–1.24)
Creatinine, Ser: 1.75 mg/dL — ABNORMAL HIGH (ref 0.61–1.24)
GFR, Estimated: 38 mL/min — ABNORMAL LOW
GFR, Estimated: 37 mL/min — ABNORMAL LOW
Glucose, Bld: 100 mg/dL — ABNORMAL HIGH (ref 70–99)
Glucose, Bld: 156 mg/dL — ABNORMAL HIGH (ref 70–99)
Phosphorus: 3.5 mg/dL (ref 2.5–4.6)
Phosphorus: 3.3 mg/dL (ref 2.5–4.6)
Potassium: 5.7 mmol/L — ABNORMAL HIGH (ref 3.5–5.1)
Potassium: 5.6 mmol/L — ABNORMAL HIGH (ref 3.5–5.1)
Sodium: 137 mmol/L (ref 135–145)
Sodium: 138 mmol/L (ref 135–145)

## 2024-06-20 LAB — GLUCOSE, CAPILLARY
Glucose-Capillary: 45 mg/dL — ABNORMAL LOW (ref 70–99)
Glucose-Capillary: 54 mg/dL — ABNORMAL LOW (ref 70–99)
Glucose-Capillary: 83 mg/dL (ref 70–99)
Glucose-Capillary: 117 mg/dL — ABNORMAL HIGH (ref 70–99)
Glucose-Capillary: 141 mg/dL — ABNORMAL HIGH (ref 70–99)
Glucose-Capillary: 136 mg/dL — ABNORMAL HIGH (ref 70–99)

## 2024-06-20 LAB — MAGNESIUM: Magnesium: 1.7 mg/dL (ref 1.7–2.4)

## 2024-06-20 LAB — VAS US ABI WITH/WO TBI
Left ABI: 1.1
Right ABI: 0.96

## 2024-06-20 LAB — T3: T3, Total: 64 ng/dL — ABNORMAL LOW (ref 71–180)

## 2024-06-20 LAB — TSH: TSH: 1.39 u[IU]/mL (ref 0.350–4.500)

## 2024-06-20 MED ORDER — LINEZOLID 600 MG PO TABS
600.0000 mg | ORAL_TABLET | Freq: Two times a day (BID) | ORAL | Status: DC
  Administered 2024-06-20 – 2024-06-28 (×18): 600 mg via ORAL
  Filled 2024-06-20 (×19): qty 1

## 2024-06-20 MED ORDER — INSULIN ASPART 100 UNIT/ML IJ SOLN
0.0000 [IU] | Freq: Every day | INTRAMUSCULAR | Status: DC
  Administered 2024-06-23: 2 [IU] via SUBCUTANEOUS
  Filled 2024-06-20: qty 2

## 2024-06-20 MED ORDER — ENSURE PLUS HIGH PROTEIN PO LIQD
237.0000 mL | Freq: Two times a day (BID) | ORAL | Status: DC
  Administered 2024-06-22 – 2024-06-27 (×6): 237 mL via ORAL

## 2024-06-20 MED ORDER — INSULIN ASPART 100 UNIT/ML IJ SOLN
0.0000 [IU] | Freq: Three times a day (TID) | INTRAMUSCULAR | Status: DC
  Administered 2024-06-24 (×2): 1 [IU] via SUBCUTANEOUS
  Administered 2024-06-25: 2 [IU] via SUBCUTANEOUS
  Administered 2024-06-26: 1 [IU] via SUBCUTANEOUS
  Administered 2024-06-26: 2 [IU] via SUBCUTANEOUS
  Filled 2024-06-20 (×2): qty 2
  Filled 2024-06-20 (×3): qty 1

## 2024-06-20 MED ORDER — ASPIRIN 81 MG PO TBEC
81.0000 mg | DELAYED_RELEASE_TABLET | Freq: Every day | ORAL | Status: DC
  Administered 2024-06-20 – 2024-06-28 (×9): 81 mg via ORAL
  Filled 2024-06-20 (×10): qty 1

## 2024-06-20 NOTE — Progress Notes (Signed)
 " PROGRESS NOTE    Vincent Baxter.  FMW:[id number] DOB: 07-06-1934 DOA: 06/18/2024 PCP: [facility]  Outpatient Specialists:     Brief Narrative:  Patient is an 89 year old male past medical history significant for systolic CHF with EF of 25 to 69%, grade 2 diastolic dysfunction, COPD, type 2 diabetes mellitus, renal mass, nephrolithiasis, abdominal aortic aneurysm, chronic kidney disease stage IIIb, anemia, GERD, BPH, THC and tobacco abuse. Patient was admitted with left lower extremity peripheral artery disease, and left foot ulcer involving the plantar aspect of first left toe.   06/20/2024: Patient seen.  Patient reports feeling cold.  No documented fever.  Patient is status post revascularization of left lower extremity arteries.  Orthopedic team has been consulted for further assessment for possible amputation.  Will panculture patient.  Will check TSH.  No leukocytosis.  Renal panel revealed potassium of 5.6 and serum creatinine of 1.75.  Patient is at risk of contrast dye associated nephropathy.   Assessment & Plan:   Principal Problem:   Diabetic foot ulcer (HCC) Active Problems:   DM2 (diabetes mellitus, type 2) (HCC)   Essential hypertension   COPD (chronic obstructive pulmonary disease) with emphysema (HCC)   Elevated troponin   Tobacco abuse   CKD (chronic kidney disease) stage 3, GFR 30-59 ml/min (HCC)   Acute on chronic systolic CHF (congestive heart failure) (HCC)   CKD (chronic kidney disease) stage 3b: - Renal function is relatively stable. - Patient has been exposed to contrast dye. - Patient is at risk for contrast associated nephropathy. - Continue to hold furosemide . - Avoid nephrotoxins whenever possible. - Dose all medications, considering impaired renal function. - Keep MAP greater than 65 mmHg. - Monitor renal function and electrolytes daily.  Hyperkalemia: - Potassium of 5.6. - Lokelma  10 g p.o. x 1 dose. - Repeat renal panel in the  morning.    COPD (chronic obstructive pulmonary disease) with emphysema (HCC) Chronic stable will continue home medications   Ischemic/diabetic foot ulcer (HCC): -Plain images showed no evidence of osteomyelitis. - See vascular surgery procedure and documentation  - Orthopedic surgery team consulted.   - Continue oral Zyvox  and IV Unasyn .     DM2 (diabetes mellitus, type 2) (HCC) Sliding scale insulin  as per protocol.   Elevated troponin Chronic stable patient denies any chest pain    Essential hypertension Continue to monitor closely.   Acute on chronic systolic CHF (congestive heart failure) (HCC) - Seems compensated. - Will continue to hold Lasix .  Patient has been exposed to contrast dye.   Tobacco abuse  - Counseled to quit.    DVT prophylaxis: Subcutaneous heparin  Code Status: Full code Family Communication:  Disposition Plan:    Consultants:  Vascular surgery team. Orthopedic surgery  Procedures:  1) Ultrasound guided right common femoral artery access  2) Aortogram with evaluation of the renal arteries 3) Left lower extremity angiogram with second order cannulation  4) Additional left lower extremity with third order cannulation 5) Ultrasound guided retrograde left posterior tibial access at the ankle 6) Complex angioplasty and stenting of the left femoropopliteal artery (6x137mm Eluvia x2) 7) Complex angioplasty of the left tibioperoneal trunk (3x180mm Sterling) 8) Complex angioplasty of the left posterior tibial artery (3x138mm Sterling) 9) Conscious sedation (42 minutes)  Antimicrobials:  IV vancomycin  discontinued. Oral Zyvox . IV Unasyn    Subjective: Reports feeling cold.  Objective: Vitals:   06/20/24 0002 06/20/24 0015 06/20/24 0336 06/20/24 0500  BP: (!) 131/91 116/89 (!) 101/52  Pulse: 80 73    Resp: 12 17    Temp: 97.6 F (36.4 C)  (!) 97.3 F (36.3 C)   TempSrc: Oral  Oral   SpO2: 100% 100%    Weight:    55.9 kg  Height:         Intake/Output Summary (Last 24 hours) at 06/20/2024 1527 Last data filed at 06/20/2024 1200 Gross per 24 hour  Intake 6 ml  Output 750 ml  Net -744 ml   Filed Weights   06/18/24 1514 06/20/24 0500  Weight: 49.9 kg 55.9 kg    Examination:  General exam: Appears calm and comfortable.  Patient is thin Respiratory system: Clear to auscultation. Respiratory effort normal. Cardiovascular system: S1 & S2 heard Gastrointestinal system: Abdomen is soft and nontender.  Central nervous system: Alert and oriented.  Extremities: Ischemic ulcer plantar surface of left first toe.  Data Reviewed: I have personally reviewed following labs and imaging studies  CBC: Recent Labs  Lab 06/18/24 1601 06/19/24 0404  WBC 7.0 7.9  NEUTROABS 5.7  --   HGB 12.7* 13.3  HCT 37.1* 38.3*  MCV 98.1 97.5  PLT 131* 128*   Basic Metabolic Panel: Recent Labs  Lab 06/18/24 1601 06/19/24 0404 06/20/24 0338 06/20/24 1115  NA 141 142  --  137  K 5.1 4.9  --  5.7*  CL 110 107  --  103  CO2 19* 24  --  20*  GLUCOSE 186* 80  --  100*  BUN 37* 38*  --  41*  CREATININE 1.59* 1.63*  --  1.73*  CALCIUM  8.9 9.1  --  8.6*  MG 1.9 1.8 1.7  --   PHOS 3.1 2.6  --  3.5   GFR: Estimated Creatinine Clearance: 23.8 mL/min (A) (by C-G formula based on SCr of 1.73 mg/dL (H)). Liver Function Tests: Recent Labs  Lab 06/19/24 0404 06/20/24 1115  AST 24  --   ALT 32  --   ALKPHOS 128*  --   BILITOT 0.6  --   PROT 6.5  --   ALBUMIN 4.0 3.6   No results for input(s): LIPASE, AMYLASE in the last 168 hours. No results for input(s): AMMONIA in the last 168 hours. Coagulation Profile: No results for input(s): INR, PROTIME in the last 168 hours. Cardiac Enzymes: Recent Labs  Lab 06/18/24 1601  CKTOTAL 72   BNP (last 3 results) Recent Labs    06/18/24 1601  PROBNP 23,026.0*   HbA1C: Recent Labs    06/18/24 1601  HGBA1C 6.8*   CBG: Recent Labs  Lab 06/19/24 2349 06/20/24 0358  06/20/24 0434 06/20/24 0833 06/20/24 1150  GLUCAP 155* 45* 54* 83 117*   Lipid Profile: Recent Labs    06/20/24 0500  CHOL 151  HDL 53  LDLCALC 87  TRIG 58  CHOLHDL 2.9   Thyroid  Function Tests: Recent Labs    06/18/24 1601  TSH 1.440  FREET4 1.32   Anemia Panel: Recent Labs    06/18/24 1601 06/18/24 2200 06/19/24 0404  VITAMINB12  --   --  642  FOLATE  --   --  4.9*  FERRITIN  --  167  --   TIBC  --  242*  --   IRON  --  63  --   RETICCTPCT 1.4  --   --    Urine analysis:    Component Value Date/Time   COLORURINE YELLOW 05/31/2024 1803   APPEARANCEUR CLEAR 05/31/2024 1803   LABSPEC  1.013 05/31/2024 1803   PHURINE 5.0 05/31/2024 1803   GLUCOSEU NEGATIVE 05/31/2024 1803   GLUCOSEU NEGATIVE 02/11/2012 0931   HGBUR SMALL (A) 05/31/2024 1803   BILIRUBINUR NEGATIVE 05/31/2024 1803   KETONESUR NEGATIVE 05/31/2024 1803   PROTEINUR 30 (A) 05/31/2024 1803   UROBILINOGEN 0.2 06/20/2022 1043   NITRITE NEGATIVE 05/31/2024 1803   LEUKOCYTESUR NEGATIVE 05/31/2024 1803   Sepsis Labs: @LABRCNTIP (procalcitonin:4,lacticidven:4)  )No results found for this or any previous visit (from the past 240 hours).       Radiology Studies: VAS US  ABI WITH/WO TBI Result Date: 06/20/2024  LOWER EXTREMITY DOPPLER STUDY Patient Name:  Vincent Baxter.  Date of Exam:   06/19/2024 Medical Rec #: [id number]           Accession #:    [id number] Date of Birth: 1935-02-20          Patient Gender: M Patient Age:   20 years Exam Location:  [facility] Procedure:      VAS US  ABI WITH/WO TBI Referring Phys: ANASTASSIA DOUTOVA --------------------------------------------------------------------------------  Indications: Ulceration. High Risk Factors: Hypertension, Diabetes, current smoker.  Limitations: Today's exam was limited due to an open wound and involuntary              patient movement. Comparison Study: 03/13/2024 - Right: Resting right ankle-brachial index is                    within normal range. The                   right toe-brachial index is abnormal.                    Left: Resting left ankle-brachial index indicates                   noncompressible left                   lower extremity arteries. The left toe-brachial index is                   absent. Performing Technologist: Gerome Ny RVT  Examination Guidelines: A complete evaluation includes at minimum, Doppler waveform signals and systolic blood pressure reading at the level of bilateral brachial, anterior tibial, and posterior tibial arteries, when vessel segments are accessible. Bilateral testing is considered an integral part of a complete examination. Photoelectric Plethysmograph (PPG) waveforms and toe systolic pressure readings are included as required and additional duplex testing as needed. Limited examinations for reoccurring indications may be performed as noted.  ABI Findings: +---------+------------------+-----+----------+--------+ Right    Rt Pressure (mmHg)IndexWaveform  Comment  +---------+------------------+-----+----------+--------+ Brachial 136                    triphasic          +---------+------------------+-----+----------+--------+ PTA      131               0.96 monophasic         +---------+------------------+-----+----------+--------+ DP       129               0.95 monophasic         +---------+------------------+-----+----------+--------+ Great Toe63                0.46                    +---------+------------------+-----+----------+--------+ +---------+------------------+-----+----------+-------+ Left  Lt Pressure (mmHg)IndexWaveform  Comment +---------+------------------+-----+----------+-------+ Brachial 130                    triphasic         +---------+------------------+-----+----------+-------+ PTA      150               1.10 monophasic        +---------+------------------+-----+----------+-------+ DP       78                0.57  monophasic        +---------+------------------+-----+----------+-------+ Great Toe                       Absent    Ulcer   +---------+------------------+-----+----------+-------+ +-------+-----------+-----------+------------+------------+ ABI/TBIToday's ABIToday's TBIPrevious ABIPrevious TBI +-------+-----------+-----------+------------+------------+ Right  0.96       0.46                                +-------+-----------+-----------+------------+------------+ Left   1.1                                            +-------+-----------+-----------+------------+------------+  Summary: Right: Resting right ankle-brachial index is within normal range. The right toe-brachial index is abnormal.  ABI's are likely falsely elevated due to medial calcification. Left: Resting left ankle-brachial index is within normal range.  ABI's are likely falsely elevated due to medial calcification. Unable to obtain TBI due to great toe ulceration. *See table(s) above for measurements and observations.  Electronically signed by Penne Colorado MD on 06/20/2024 at 10:40:50 AM.    Final    PERIPHERAL VASCULAR CATHETERIZATION Result Date: 06/19/2024 DATE OF SERVICE: 06/19/2024  PATIENT:  Vincent Baxter.  89 y.o. 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 ulceration  POST-OPERATIVE DIAGNOSIS:  Same  PROCEDURE:  1) Ultrasound guided right common femoral artery access 2) Aortogram with evaluation of the renal arteries 3) Left lower extremity angiogram with second order cannulation 4) Additional left lower extremity with third order cannulation 5) Ultrasound guided retrograde left posterior tibial access at the ankle 6) Complex angioplasty and stenting of the left femoropopliteal artery (6x157mm Eluvia x2) 7) Complex angioplasty of the left tibioperoneal trunk (3x174mm Sterling) 8) Complex angioplasty of the left posterior tibial artery (3x188mm Sterling) 9)  Conscious sedation (42 minutes)  SURGEON:  Debby SAILOR. Magda, MD  ASSISTANT: none  ANESTHESIA:   local and IV sedation  ESTIMATED BLOOD LOSS: min  LOCAL MEDICATIONS USED:  LIDOCAINE   COUNTS: confirmed correct.  PATIENT DISPOSITION:  PACU - hemodynamically stable.  Delay start of Pharmacological VTE agent (>24hrs) due to surgical blood loss or risk of bleeding: no  INDICATION FOR PROCEDURE: Vincent Baxter. is a 89 y.o. male with left great toe ischemic ulceration in the setting of severe peripheral arterial disease. After careful discussion of risks, benefits, and alternatives the patient was offered angiography with possible intervention. The patient understood and wished to proceed.  OPERATIVE FINDINGS:  Globally slow transit of contrast worrisome for heart failure.  Aortogram: Renal arteries Left: >60% stenosis    Right: >60% stenosis Infrarenal aorta: patent Common iliac arteries: Left: patent      Right: patent Internal iliac arteries: Left: patent  Right: patent External iliac arteries: Left: patent      Right: patent  Left Lower Extremity Angiogram:             Common femoral artery: patent             Profunda femoris artery: two, codominant profunda femoris arteries that are patent             Superficial femoral artery: patent proximally; occluded in mid vessel. Severely diseased throughout the distal vessel. Calcified lesion.             Popliteal artery: reconstitutes above the knee. Severely diseased above and behind the knee. Calcified.             Anterior tibial artery: patent proximally. Occludes in proximal calf. DP reconstitutes in the foot.             Tibioperoneal trunk: occluded             Peroneal artery: occluded             Posterior tibial artery: occluded proximally. Reconstitutes in the calf. Dominant tibial. Fills the foot             Pedal circulation: Disadvantaged  GLASS score. FP: 3. IP: 3  WIfI score. Wound: 2; ischemia: 3; infection: 0. Stage: 4  DESCRIPTION OF PROCEDURE:  After identification of the patient in the pre-operative holding area, the patient was transferred to the operating room. The patient was positioned supine on the operating room table. Anesthesia was induced. The groins was prepped and draped in standard fashion. A surgical pause was performed confirming correct patient, procedure, and operative location.  The right groin was anesthetized with subcutaneous injection of 1% lidocaine . Using ultrasound guidance, the right common femoral artery was accessed with micropuncture technique.  Fluoroscopy was used to confirm cannulation over the femoral head. The 63F micropuncture sheath was upsized to 81F.  A Benson wire was advanced into the distal aorta. Over the wire an omni flush catheter was advanced to the level of L2. Aortogram was performed - see above for details.  The left common iliac artery was selected with an omniflush catheter and glidewire advantage guidewire. The wire was advanced into the common femoral artery. Over the wire the omni flush catheter was advanced into the external iliac artery. Selective angiography was performed - see above for details.  The decision was made to intervene. The patient was heparinized with 6000 units of heparin . The 81F sheath was exchanged for a 28F x 45cm sheath. Selective angiography of the left lower extremity performed prior to intervention.  I was able to cross the femoral-popliteal occlusion antegrade.  Below the knee there was a flush occlusion at the origin of the tibioperoneal trunk which I could not cross antegrade.  Posterior tibial artery at the ankle was accessed in a retrograde fashion using ultrasound access with a micropuncture kit.  After obtaining access, wire was navigated into the posterior tibial artery and a micro sheath introduced into the artery.  An 018 12 g weighted tip shepherd wire was used to cross the posterior tibial and tibioperoneal trunk occlusion.  After confirming our position in the true  lumen the 018 wire was snared from the contralateral access with a lasso type snare.  The wire was externalized through the right common femoral artery access sheath.  We then began our intervention.  The posterior tibial artery was angioplastied up to the sheath with a 3 x 150 mm Sterling  balloon.  The tibioperoneal trunk was similarly angioplastied with a 3 x 150 mm Sterling balloon.  Two 6 x 150 mm Eluvia stents were deployed in the femoral-popliteal position from above-the-knee to proximal third of SFA.  These were postdilated with a 5 x 150 mm Sterling balloon.  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 Flow to the level of the sheath was achieved.  A Perclose was used to close the arteriotomy in the right common femoral artery. Hemostasis was excellent upon completion. The left posterior tibial sheath was left in place to be removed in the recovery area.  Conscious sedation was administered with the use of IV fentanyl  and midazolam  under continuous physician and nurse monitoring.  Heart rate, blood pressure, and oxygen saturation were continuously monitored.  Total sedation time was 42 minutes  Upon completion of the case instrument and sharps counts were confirmed correct. The patient was transferred to the PACU in good condition. I was present for all portions of the procedure.  PLAN: Aspirin , Plavix , statin therapies.  Optimized from a vascular standpoint.  He is cleared to undergo any kind of foot surgery necessary.  Follow-up with me in 4 weeks with ABI and left lower extremity arterial duplex.  Debby SAILOR. Magda, MD [facility] Phone Number: [phone] 06/19/2024 4:18 PM    DG Chest 2 View Result Date: 06/18/2024 CLINICAL DATA:  Cellulitis.  Weakness. EXAM: CHEST - 2 VIEW; RIGHT FOOT COMPLETE - 3+ VIEW COMPARISON:   03/12/2024, 03/23/2022. FINDINGS: Chest: The heart size and mediastinal contours are within normal limits. There is atherosclerotic calcification of the aorta. Stable interstitial prominence and emphysematous changes are noted bilaterally. There is mild atelectasis or scarring at the left lung base. No effusion or pneumothorax is seen. No acute osseous abnormality. Right foot: No acute fracture or dislocation is seen. There is hallux valgus deformity with degenerative changes at the first metatarsophalangeal joint. Degenerative changes are present in the midfoot. There is diffuse soft tissue swelling. Vascular calcifications are noted in the soft tissues. IMPRESSION: 1. Stable chest with no acute cardiopulmonary process. 2. No acute osseous abnormality in the right foot. Electronically Signed   By: Leita Birmingham M.D.   On: 06/18/2024 16:54   DG Foot Complete Right Result Date: 06/18/2024 CLINICAL DATA:  Cellulitis.  Weakness. EXAM: CHEST - 2 VIEW; RIGHT FOOT COMPLETE - 3+ VIEW COMPARISON:  03/12/2024, 03/23/2022. FINDINGS: Chest: The heart size and mediastinal contours are within normal limits. There is atherosclerotic calcification of the aorta. Stable interstitial prominence and emphysematous changes are noted bilaterally. There is mild atelectasis or scarring at the left lung base. No effusion or pneumothorax is seen. No acute osseous abnormality. Right foot: No acute fracture or dislocation is seen. There is hallux valgus deformity with degenerative changes at the first metatarsophalangeal joint. Degenerative changes are present in the midfoot. There is diffuse soft tissue swelling. Vascular calcifications are noted in the soft tissues. IMPRESSION: 1. Stable chest with no acute cardiopulmonary process. 2. No acute osseous abnormality in the right foot. Electronically Signed   By: Leita Birmingham M.D.   On: 06/18/2024 16:54   DG Foot Complete Left Result Date: 06/18/2024 CLINICAL DATA:  Cellulitis. EXAM: LEFT  FOOT - COMPLETE 3+ VIEW COMPARISON:  Left foot radiograph dated 03/23/2022. FINDINGS: No acute fracture or dislocation. The bones are osteopenic. No periosteal elevation or bone erosion.  The soft tissue swelling of the foot. No acute pathology or soft tissue gas. Vascular calcification. IMPRESSION: 1. No acute fracture or dislocation. 2. Soft tissue swelling of the foot. Electronically Signed   By: Vanetta Chou M.D.   On: 06/18/2024 16:50        Scheduled Meds:  aspirin  EC  81 mg Oral Daily   atorvastatin   40 mg Oral Daily   clopidogrel   75 mg Oral Q breakfast   feeding supplement  237 mL Oral BID BM   insulin  aspart  0-9 Units Subcutaneous Q4H   linezolid   600 mg Oral Q12H   sodium chloride  flush  3 mL Intravenous Q12H   sodium chloride  flush  3 mL Intravenous Q12H   Continuous Infusions:  sodium chloride      ampicillin -sulbactam (UNASYN ) IV 3 g (06/20/24 1206)     LOS: 2 days    Time spent: 55 minutes.    Leatrice Chapel, MD  [facility] 7PM-7AM contact night coverage as above    "

## 2024-06-20 NOTE — Progress Notes (Signed)
 MEWS Progress Note  Patient Details Name: Vincent Baxter. MRN: [id number] DOB: [birth year] Today's Date: 06/20/2024   MEWS Flowsheet Documentation:  Assess: MEWS Score Temp: (!) 97.5 F (36.4 C) BP: (!) 89/59 MAP (mmHg): 69 Pulse Rate: 77 ECG Heart Rate: 84 Resp: 13 Level of Consciousness: Alert SpO2: 92 % O2 Device: Room Air O2 Flow Rate (L/min): 2 L/min Assess: MEWS Score MEWS Temp: 0 MEWS Systolic: 1 MEWS Pulse: 0 MEWS RR: 1 MEWS LOC: 0 MEWS Score: 2 MEWS Score Color: Yellow Assess: SIRS CRITERIA SIRS Temperature : 0 SIRS Respirations : 0 SIRS Pulse: 0 SIRS WBC: 0 SIRS Score Sum : 0 SIRS Temperature : 0 SIRS Pulse: 0 SIRS Respirations : 0 SIRS WBC: 0 SIRS Score Sum : 0 Assess: if the MEWS score is Yellow or Red Were vital signs accurate and taken at a resting state?: Yes Does the patient meet 2 or more of the SIRS criteria?: No MEWS guidelines implemented : Yes, yellow Treat MEWS Interventions: Considered administering scheduled or prn medications/treatments as ordered Take Vital Signs Increase Vital Sign Frequency : Yellow: Q2hr x1, continue Q4hrs until patient remains green for 12hrs Escalate MEWS: Escalate: Yellow: Discuss with charge nurse and consider notifying provider and/or RRT Notify: Charge Nurse/RN Name of Charge Nurse/RN Notified: Reden, RN      Robbie Rideaux Leonie JAYSON Fuller 06/20/2024, 11:48 PM

## 2024-06-20 NOTE — Progress Notes (Signed)
 Initial Nutrition Assessment  DOCUMENTATION CODES:   Not applicable  INTERVENTION:  - Regular diet.  - Ensure Plus High Protein po BID, each supplement provides 350 kcal and 20 grams of protein. - Add High-Calorie, High-Protein Nutrition Therapy diet education to AVS. - Monitor weight trends.   NUTRITION DIAGNOSIS:   Increased nutrient needs related to wound healing as evidenced by estimated needs.  GOAL:   Patient will meet greater than or equal to 90% of their needs  MONITOR:   PO intake, Supplement acceptance, Labs, Weight trends, Skin  REASON FOR ASSESSMENT:   Consult Assessment of nutrition requirement/status, Wound healing  ASSESSMENT:   89 y.o male with PMH significant for systolic CHF with EF of 25 to 69%, COPD, DM2, renal mass, nephrolithiasis, abdominal aortic aneurysm, CKD IIIb, anemia, GERD, BPH, THC and tobacco abuse admitted with left lower extremity PAD and left foot ulcer involving the plantar aspect of first left toe    1/1 Admit 1/2 s/p left lower extremity angiogram with fempop angioplasty and stenting, and angioplasty of the posterior tibial artery and tibioperoneal trunk   RD working remotely. Called patient via bedside telephone to obtain nutrition history but unable to reach patient.   Per EMR, patient has had weight gain over the past year.  He is currently on a regular diet, no meal intakes documented at this time. Will add ONS to support oral intake.   Vascular Surgery note today indicates patient agreeable to amputation of left toe/foot. Will add high calorie/protein diet education to AVS.    Medications reviewed and include: -  Labs reviewed:  K+ 5.7 Creatinine 1.73 HA1C 6.8 (as of 1/1) Blood Glucose 45-181 x24 hours  NUTRITION - FOCUSED PHYSICAL EXAM:  Unable to obtain - RD working remotely  Diet Order:   Diet Order             Diet regular Room service appropriate? Yes; Fluid consistency: Thin  Diet effective now                    EDUCATION NEEDS:  Education needs have been addressed  Skin:  Skin Assessment: Reviewed RN Assessment  Last BM:  unknown  Height:  Ht Readings from Last 1 Encounters:  06/18/24 5' 7 (1.702 m)   Weight:  Wt Readings from Last 1 Encounters:  06/20/24 55.9 kg   BMI:  Body mass index is 19.3 kg/m.  Estimated Nutritional Needs:  Kcal:  1700-1850 kcals Protein:  75-85 grams Fluid:  >/= 1.7L    Trude Ned RD, LDN Contact via Secure Chat.

## 2024-06-20 NOTE — Progress Notes (Signed)
" °  Echocardiogram 2D Echocardiogram has been attempted, pt refused. Stated he needed pajamas, offered warm blankets instead due to nature of exam but said they wouldn't help. Explained that I would have to unbutton gown to do ultrasound and ultimately refused again.   Tinnie FORBES Gosling RDCS 06/20/2024, 10:01 AM "

## 2024-06-20 NOTE — Progress Notes (Signed)
(  L) Posterior tibial ankle access site  noted bleeding during shift change with the off going nurse.  Pressure applied to site for twenty minutes and redressed.  Reassessed after 15 minutes x2 with no additional bleeding noted.

## 2024-06-20 NOTE — Discharge Instructions (Signed)
 High-Calorie, High-Protein Nutrition Therapy (2021) A high-calorie, high-protein diet has been recommended to you. Your registered dietitian nutritionist (RDN) may have recommended this diet because you are having difficulty eating enough calories throughout the day, you have lost weight, and/or you need to add protein to your diet. Sometimes you may not feel like eating, even if you know the importance of good nutrition. The recommendations in this handout can help you with the following: Regaining your strength and energy Keeping your body healthy Healing and recovering from surgery or illness and fighting infection Tips: Schedule Your Meals and Snacks Several small meals and snacks are often better tolerated and digested than large meals. Strategies Plan to eat 3 meals and 3 snacks daily. Experiment with timing meals to find out when you have a larger appetite. Appetite may be greatest in the morning after not eating all night so you may prefer to eat your larger meals and snacks in the morning and at lunch. Breakfast-type foods are often better tolerated so eat foods such as eggs, pancakes, waffles and cereal for any meal or snack. Carry snacks with you so you are prepared to eat every 2 to 3 hours. Determine what works best for you if your bodys cues for feeling hungry or full are not working. Eat a small meal or snack even if you dont feel hungry. Set a timer to remind you when it is time to eat. Take a walk before you eat (with health care providers approval). Light or moderate physical activity can help you maintain muscle and increase your appetite. Make Eating Enjoyable Taking steps to make the experience enjoyable may help to increase your interest in eating and improve your appetite. Strategies: Eat with others whenever possible. Include your favorite foods to make meals more enjoyable. Try new foods. Save your beverage for the end of the meal so that you have more room for  food before you get full. Add Calories to Your Meals and Snacks Try adding calorie-dense foods so that each bite provides more nutrition. Strategies Drink milk, chocolate milk, soy milk, or smoothies instead of low-calorie beverages such as diet drinks or water. Cook with milk or soy milk instead of water when making dishes such as hot cereal, cocoa, or pudding. Add jelly, jam, honey, butter or margarine to bread and crackers. Add jam or fruit to ice cream and as a topping over cake. Mix dried fruit, nuts, granola, honey, or dry cereal with yogurt or hot cereals. Enjoy snacks such as milkshakes, smoothies, pudding, ice cream, or custard. Blend a fruit smoothie of a banana, frozen berries, milk or soy milk, and 1 tablespoon nonfat powdered milk or protein powder. Add Protein to Your Meals and Snacks Choose at least one protein food at each meal and snack to increase your daily intake. Strategies Add hard-cooked eggs, leftover meat, grated cheese, canned beans or tofu to noodles, rice, salads, sandwiches, soups, casseroles, pasta, tuna and other mixed dishes. Add protein powder to hot cereals, meatloaf, casseroles, scrambled eggs, sauces, cream soups, and shakes. Add beans and lentils to salads, soups, casseroles, and vegetable dishes. Eat cottage cheese or yogurt, especially Greek yogurt, with fruit as a snack or dessert. Eat peanut or other nut butters on crackers, bread, toast, waffles, apples, bananas or celery sticks. Add it to milkshakes, smoothies, or desserts. Consider a ready-made protein shake. Your RDN will make recommendations. Add Fats to Your Meals and Snacks Try adding fats to your meals and snacks. Fat provides more calories in  fewer bites than carbohydrate or protein and adds flavors to your foods. Strategies Snack on nuts and seeds or add them to foods like salads, pasta, cereals, yogurt, and ice cream.  Snack on olives or add to pasta, pizza, or salad. Add avocado or  guacamole to your salads, sandwiches, and other entrees. Include fatty fish such as salmon in your weekly meal plan. For general food safety tips, especially for clients with immunocompromised conditions, ask your RDN for the Food Safety Nutrition Therapy handout. Small Meal and Snack Ideas These snacks and meals are recommended when you have to eat but arent necessarily hungry.  They are good choices because they are high in protein and high in calories.  2 graham crackers 2 tablespoons peanut or other nut butter 1 cup milk 2 slices whole wheat toast topped with:  avocado, mashed Seasoning of your choice   cup Greek yogurt  cup fruit  cup granola 2 deviled egg halves 5 whole wheat crackers  1 cup cream of tomato soup  grilled cheese sandwich 1 toasted waffle topped with: 2 tablespoons peanut or nut butter 1 tablespoon jam  Trail mix made with:  cup nuts  cup dried fruit  cup cold cereal, any variety  cup oatmeal or cream of wheat cereal 1 tablespoon peanut or nut butter  cup diced fruit

## 2024-06-20 NOTE — Progress Notes (Signed)
 CBG at 0358 was 45, patient administered 4 ounces of juice, CBG rechecked at 0434 with a reading of 54, additional 4 ounces of juice given with a CBG of 74 at 0515.

## 2024-06-20 NOTE — Progress Notes (Addendum)
" °  Progress Note    06/20/2024 9:18 AM 1 Day Post-Op  Subjective: No complaints    Vitals:   06/20/24 0015 06/20/24 0336  BP: 116/89 (!) 101/52  Pulse: 73   Resp: 17   Temp:  (!) 97.3 F (36.3 C)  SpO2: 100%     Physical Exam: General: Lying in bed, NAD Cardiac: Regular Lungs: Nonlabored Incisions: Right groin access site soft without hematoma Extremities: Brisk left DP Doppler signal.  Left foot bandaged and dry   CBC    Component Value Date/Time   WBC 7.9 06/19/2024 0404   RBC 3.93 (L) 06/19/2024 0404   HGB 13.3 06/19/2024 0404   HCT 38.3 (L) 06/19/2024 0404   PLT 128 (L) 06/19/2024 0404   MCV 97.5 06/19/2024 0404   MCH 33.8 06/19/2024 0404   MCHC 34.7 06/19/2024 0404   RDW 14.6 06/19/2024 0404   LYMPHSABS 0.7 06/18/2024 1601   MONOABS 0.5 06/18/2024 1601   EOSABS 0.0 06/18/2024 1601   BASOSABS 0.0 06/18/2024 1601    BMET    Component Value Date/Time   NA 142 06/19/2024 0404   K 4.9 06/19/2024 0404   CL 107 06/19/2024 0404   CO2 24 06/19/2024 0404   GLUCOSE 80 06/19/2024 0404   BUN 38 (H) 06/19/2024 0404   CREATININE 1.63 (H) 06/19/2024 0404   CALCIUM  9.1 06/19/2024 0404   GFRNONAA 41 (L) 06/19/2024 0404   GFRAA  12/12/2009 2237    >60        The eGFR has been calculated using the MDRD equation. This calculation has not been validated in all clinical situations. eGFR's persistently <60 mL/min signify possible Chronic Kidney Disease.    INR    Component Value Date/Time   INR 1.0 03/12/2024 1710     Intake/Output Summary (Last 24 hours) at 06/20/2024 9081 Last data filed at 06/20/2024 0845 Gross per 24 hour  Intake 9 ml  Output 600 ml  Net -591 ml      Assessment/Plan:  89 y.o. male is 1 day postop, s/p: Left lower extremity angiogram with fempop angioplasty and stenting, and angioplasty of the posterior tibial artery and tibioperoneal trunk   - He is doing well this morning without any complaints -Right groin access site soft  without hematoma -Left lower extremity is maximally revascularized.  He has a brisk left DP Doppler signal -Continue aspirin , statin, and Plavix  -Okay to proceed with left toe/foot amputation -Will arrange follow-up with our office in 4 weeks with noninvasive studies   Ahmed Holster, PA-C [facility] [phone] 06/20/2024 9:18 AM  I have independently interviewed and examined patient and agree with PA assessment and plan above.  He is frustrated about the cold temperature of the hospital as well as lack of salt and need for better clothing this morning.  From a vascular standpoint he is progressing well with brisk Doppler signals at the left foot and he is optimized from a vascular standpoint with follow-up scheduled.  Mackena Plummer C. Sheree, MD [facility] [place] Office: [phone] Pager: [phone]    "

## 2024-06-21 ENCOUNTER — Inpatient Hospital Stay (HOSPITAL_COMMUNITY)

## 2024-06-21 DIAGNOSIS — I96 Gangrene, not elsewhere classified: Secondary | ICD-10-CM | POA: Diagnosis not present

## 2024-06-21 DIAGNOSIS — L97408 Non-pressure chronic ulcer of unspecified heel and midfoot with other specified severity: Secondary | ICD-10-CM

## 2024-06-21 DIAGNOSIS — E08621 Diabetes mellitus due to underlying condition with foot ulcer: Secondary | ICD-10-CM | POA: Diagnosis not present

## 2024-06-21 DIAGNOSIS — I5021 Acute systolic (congestive) heart failure: Secondary | ICD-10-CM

## 2024-06-21 LAB — RENAL FUNCTION PANEL
Albumin: 3.5 g/dL (ref 3.5–5.0)
Anion gap: 11 (ref 5–15)
BUN: 42 mg/dL — ABNORMAL HIGH (ref 8–23)
CO2: 22 mmol/L (ref 22–32)
Calcium: 8.6 mg/dL — ABNORMAL LOW (ref 8.9–10.3)
Chloride: 103 mmol/L (ref 98–111)
Creatinine, Ser: 1.75 mg/dL — ABNORMAL HIGH (ref 0.61–1.24)
GFR, Estimated: 37 mL/min — ABNORMAL LOW
Glucose, Bld: 190 mg/dL — ABNORMAL HIGH (ref 70–99)
Phosphorus: 3.4 mg/dL (ref 2.5–4.6)
Potassium: 5.3 mmol/L — ABNORMAL HIGH (ref 3.5–5.1)
Sodium: 136 mmol/L (ref 135–145)

## 2024-06-21 LAB — ECHOCARDIOGRAM COMPLETE
Area-P 1/2: 5.38 cm2
Height: 67 in
S' Lateral: 3.25 cm
Single Plane A4C EF: 29.3 %
Weight: 1971.79 [oz_av]

## 2024-06-21 LAB — GLUCOSE, CAPILLARY
Glucose-Capillary: 97 mg/dL (ref 70–99)
Glucose-Capillary: 124 mg/dL — ABNORMAL HIGH (ref 70–99)
Glucose-Capillary: 145 mg/dL — ABNORMAL HIGH (ref 70–99)
Glucose-Capillary: 151 mg/dL — ABNORMAL HIGH (ref 70–99)
Glucose-Capillary: 139 mg/dL — ABNORMAL HIGH (ref 70–99)

## 2024-06-21 MED ORDER — SODIUM ZIRCONIUM CYCLOSILICATE 10 G PO PACK
10.0000 g | PACK | Freq: Once | ORAL | Status: AC
  Administered 2024-06-21: 10 g via ORAL
  Filled 2024-06-21: qty 1

## 2024-06-21 MED ORDER — HEPARIN SODIUM (PORCINE) 5000 UNIT/ML IJ SOLN
5000.0000 [IU] | Freq: Two times a day (BID) | INTRAMUSCULAR | Status: DC
  Administered 2024-06-21 – 2024-06-28 (×17): 5000 [IU] via SUBCUTANEOUS
  Filled 2024-06-21 (×16): qty 1

## 2024-06-21 NOTE — NC FL2 (Signed)
 " [place]  MEDICAID FL2 LEVEL OF CARE FORM     IDENTIFICATION  Patient Name: Vincent Baxter. Birthdate: 1935-05-04 Sex: male Admission Date (Current Location): 06/18/2024  [facility] and [place] Number:  Producer, Television/film/video and Address:  [facility], [address], [place], [place] [address]      Provider Number: [id number]  Attending Physician Name and Address:  Rosario Leatrice FERNS, MD  Relative Name and Phone Number:  Raidyn, Wassink   [phone]    Current Level of Care: Hospital Recommended Level of Care: Skilled Nursing Facility Prior Approval Number:    Date Approved/Denied:   PASRR Number: [id number]  Discharge Plan: SNF    Current Diagnoses: Patient Active Problem List   Diagnosis Date Noted   Gangrene of toe of left foot (HCC) 06/21/2024   Diabetic foot ulcer (HCC) 06/18/2024   Acute on chronic systolic CHF (congestive heart failure) (HCC) 06/18/2024   Claudication of both lower extremities 03/13/2024   CKD (chronic kidney disease) stage 3, GFR 30-59 ml/min (HCC) 03/13/2024   Multifocal pneumonia 03/12/2024   Cardiomyopathy (HCC) 04/19/2022   Left ventricular hypertrophy 04/19/2022   Renal mass 03/23/2022   Rib pain 03/23/2022   Bradycardia 03/22/2022   Abnormal EKG    PAC (premature atrial contraction)    PVC (premature ventricular contraction)    Tobacco abuse    Right flank pain 05/02/2020   Elevated troponin 05/01/2020   New onset of congestive heart failure (HCC) 05/01/2020   AAA (abdominal aortic aneurysm)    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   ANOREXIA 02/23/2009   DYSLIPIDEMIA 01/18/2009   DM2 (diabetes mellitus, type 2) (HCC) 01/10/2009   GERD 01/10/2009   Essential hypertension 01/04/2009   RENAL CALCULUS 01/04/2009    Orientation RESPIRATION BLADDER Height & Weight     Self, Time, Situation, Place  Normal Continent Weight: 121 lb 11.1 oz (55.2 kg) Height:   5' 7 (170.2 cm)  BEHAVIORAL SYMPTOMS/MOOD NEUROLOGICAL BOWEL NUTRITION STATUS      Continent Diet (See dc summary)  AMBULATORY STATUS COMMUNICATION OF NEEDS Skin   Extensive Assist Verbally PU Stage and Appropriate Care (Wound 06/18/24 Vascular Ulcer Toe (Comment which one) Anterior;Left)                       Personal Care Assistance Level of Assistance  Bathing, Feeding, Dressing Bathing Assistance: Maximum assistance Feeding assistance: Limited assistance Dressing Assistance: Maximum assistance     Functional Limitations Info  Sight, Hearing, Speech Sight Info: Adequate Hearing Info: Adequate Speech Info: Adequate    SPECIAL CARE FACTORS FREQUENCY  PT (By licensed PT), OT (By licensed OT)     PT Frequency: 5x weekly OT Frequency: 5x weekly            Contractures      Additional Factors Info  Code Status, Allergies Code Status Info: Full Allergies Info: No Known Allergies           Current Medications (06/21/2024):  This is the current hospital active medication list Current Facility-Administered Medications  Medication Dose Route Frequency Provider Last Rate Last Admin   acetaminophen  (TYLENOL ) tablet 650 mg  650 mg Oral Q6H PRN Doutova, Anastassia, MD   650 mg at 06/19/24 2135   Or   acetaminophen  (TYLENOL ) suppository 650 mg  650 mg Rectal Q6H PRN Doutova, Anastassia, MD       Ampicillin -Sulbactam (UNASYN ) 3 g in sodium chloride  0.9 %  100 mL IVPB  3 g Intravenous Q12H Tanda Powell ORN, RPH 200 mL/hr at 06/20/24 2306 3 g at 06/20/24 2306   aspirin  EC tablet 81 mg  81 mg Oral Daily Magda Debby SAILOR, MD   81 mg at 06/21/24 0945   atorvastatin  (LIPITOR) tablet 40 mg  40 mg Oral Daily Gretel Prentice BIRCH, RPH   40 mg at 06/21/24 0945   clopidogrel  (PLAVIX ) tablet 75 mg  75 mg Oral Q breakfast Magda Debby SAILOR, MD   75 mg at 06/21/24 0945   feeding supplement (ENSURE PLUS HIGH PROTEIN) liquid 237 mL  237 mL Oral BID BM Rosario Eland I, MD       fentaNYL   (SUBLIMAZE ) injection 12.5-50 mcg  12.5-50 mcg Intravenous Q2H PRN Doutova, Anastassia, MD   50 mcg at 06/19/24 9162   guaiFENesin -dextromethorphan  (ROBITUSSIN DM) 100-10 MG/5ML syrup 5 mL  5 mL Oral Q4H PRN Doutova, Anastassia, MD   5 mL at 06/18/24 2347   heparin  injection 5,000 Units  5,000 Units Subcutaneous Q12H Ogbata, Sylvester I, MD   5,000 Units at 06/21/24 0945   hydrALAZINE  (APRESOLINE ) injection 5 mg  5 mg Intravenous Q20 Min PRN Magda Debby SAILOR, MD       HYDROcodone -acetaminophen  (NORCO/VICODIN) 5-325 MG per tablet 1-2 tablet  1-2 tablet Oral Q4H PRN Doutova, Anastassia, MD   2 tablet at 06/20/24 2301   insulin  aspart (novoLOG ) injection 0-5 Units  0-5 Units Subcutaneous QHS Sundil, Subrina, MD       insulin  aspart (novoLOG ) injection 0-6 Units  0-6 Units Subcutaneous TID WC Sundil, Subrina, MD       labetalol  (NORMODYNE ) injection 10 mg  10 mg Intravenous Q10 min PRN Magda Debby SAILOR, MD       linezolid  (ZYVOX ) tablet 600 mg  600 mg Oral Q12H Ogbata, Sylvester I, MD   600 mg at 06/21/24 0945   ondansetron  (ZOFRAN ) tablet 4 mg  4 mg Oral Q6H PRN Doutova, Anastassia, MD       Or   ondansetron  (ZOFRAN ) injection 4 mg  4 mg Intravenous Q6H PRN Doutova, Anastassia, MD       sodium chloride  flush (NS) 0.9 % injection 3 mL  3 mL Intravenous Q12H Doutova, Anastassia, MD   3 mL at 06/21/24 0946   sodium chloride  flush (NS) 0.9 % injection 3 mL  3 mL Intravenous PRN Doutova, Anastassia, MD       sodium chloride  flush (NS) 0.9 % injection 3 mL  3 mL Intravenous Q12H Magda Debby SAILOR, MD   3 mL at 06/21/24 0946   sodium chloride  flush (NS) 0.9 % injection 3 mL  3 mL Intravenous PRN Magda Debby SAILOR, MD         Discharge Medications: Please see discharge summary for a list of discharge medications.  Relevant Imaging Results:  Relevant Lab Results:   Additional Information SSN: [id number]  Arlana JINNY Moats, LCSWA     "

## 2024-06-21 NOTE — Plan of Care (Signed)
" °  Problem: Fluid Volume: Goal: Ability to maintain a balanced intake and output will improve Outcome: Progressing   Problem: Metabolic: Goal: Ability to maintain appropriate glucose levels will improve Outcome: Progressing   Problem: Nutritional: Goal: Maintenance of adequate nutrition will improve Outcome: Progressing   Problem: Clinical Measurements: Goal: Ability to maintain clinical measurements within normal limits will improve Outcome: Progressing Goal: Cardiovascular complication will be avoided Outcome: Progressing   Problem: Activity: Goal: Risk for activity intolerance will decrease Outcome: Progressing   Problem: Coping: Goal: Level of anxiety will decrease Outcome: Progressing   Problem: Pain Managment: Goal: General experience of comfort will improve and/or be controlled Outcome: Progressing   Problem: Safety: Goal: Ability to remain free from injury will improve Outcome: Progressing   Problem: Skin Integrity: Goal: Risk for impaired skin integrity will decrease Outcome: Progressing   Problem: Cardiac: Goal: Ability to achieve and maintain adequate cardiopulmonary perfusion will improve Outcome: Progressing   Problem: Cardiovascular: Goal: Ability to achieve and maintain adequate cardiovascular perfusion will improve Outcome: Progressing Goal: Vascular access site(s) Level 0-1 will be maintained Outcome: Progressing   "

## 2024-06-21 NOTE — TOC Initial Note (Signed)
 Transition of Care (TOC) - Initial/Assessment Note    Patient Details  Name: Vincent Baxter. MRN: [id number] Date of Birth: 1935-01-08  Transition of Care [facility] [place]) CM/SW Contact:    Arlana JINNY Nicholaus ISRA[place] Phone Number: [phone] 06/21/2024, 11:24 AM  Clinical Narrative:    CSW met with patient at bedside. Patient stated that he lives alone. Patient stated that he still drives. Patient stated that he has no history of HH services. Patient stated that he does not use any equipment. Patient stated that he does not have a scale at home. Patient stated that he does not have a PCP. CSW explained that a hospital follow up appointment is typically scheduled closer towards dc. Patient is agreeable to establishing care.   CSW also addressed SNF consult. CSW explained SNF process. Patient is agreeable to bring faxed out. Present accepted bed offers list when available.    CSW completed the patients PASRR. Completed FL2. SNF offers pending.    Unit CSW/CM will continue to follow and monitor for dc readiness.        Patient Goals and CMS Choice            Expected Discharge Plan and Services                                              Prior Living Arrangements/Services                       Activities of Daily Living   ADL Screening (condition at time of admission) Independently performs ADLs?: Yes (appropriate for developmental age) Is the patient deaf or have difficulty hearing?: No Does the patient have difficulty seeing, even when wearing glasses/contacts?: No Does the patient have difficulty concentrating, remembering, or making decisions?: No  Permission Sought/Granted                  Emotional Assessment              Admission diagnosis:  Peripheral edema [R60.0] Diabetic foot ulcer (HCC) [E11.621, L97.509] Elevated troponin [R79.89] Ulcer of left foot, unspecified ulcer stage (HCC) [L97.529] Patient Active Problem List   Diagnosis  Date Noted   Gangrene of toe of left foot (HCC) 06/21/2024   Diabetic foot ulcer (HCC) 06/18/2024   Acute on chronic systolic CHF (congestive heart failure) (HCC) 06/18/2024   Claudication of both lower extremities 03/13/2024   CKD (chronic kidney disease) stage 3, GFR 30-59 ml/min (HCC) 03/13/2024   Multifocal pneumonia 03/12/2024   Cardiomyopathy (HCC) 04/19/2022   Left ventricular hypertrophy 04/19/2022   Renal mass 03/23/2022   Rib pain 03/23/2022   Bradycardia 03/22/2022   Abnormal EKG    PAC (premature atrial contraction)    PVC (premature ventricular contraction)    Tobacco abuse    Right flank pain 05/02/2020   Elevated troponin 05/01/2020   New onset of congestive heart failure (HCC) 05/01/2020   AAA (abdominal aortic aneurysm)    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 [place] [address] Phone: [phone] Fax: [phone]  [id number] [address] [place], [place] - [address] [place] [address] Phone: [phone] Fax: [phone]  [facility] [address] [place] [address] Phone: [phone] Fax: [phone]     Social Drivers of Health (SDOH) Social History: SDOH Screenings   Food Insecurity: No Food Insecurity (06/19/2024)  Housing: Low Risk (06/19/2024)  Transportation Needs: No Transportation Needs (06/19/2024)  Utilities: Not At Risk (06/19/2024)  Social Connections: Socially Isolated (06/19/2024)  Tobacco Use: High Risk (06/18/2024)   SDOH Interventions:     Readmission Risk Interventions     No data to display

## 2024-06-21 NOTE — Progress Notes (Signed)
 " PROGRESS NOTE    Vincent Baxter.  FMW:[id number] DOB: 12-Jun-1935 DOA: 06/18/2024 PCP: [facility]  Outpatient Specialists:     Brief Narrative:  Patient is an 89 year old male past medical history significant for systolic CHF with EF of 25 to 69%, grade 2 diastolic dysfunction, COPD, type 2 diabetes mellitus, renal mass, nephrolithiasis, abdominal aortic aneurysm, chronic kidney disease stage IIIb, anemia, GERD, BPH, THC and tobacco abuse. Patient was admitted with left lower extremity peripheral artery disease, and left foot ulcer involving the plantar aspect of first left toe.   06/20/2024: Patient seen.  Patient reports feeling cold.  No documented fever.  Patient is status post revascularization of left lower extremity arteries.  Orthopedic team has been consulted for further assessment for possible amputation.  Will panculture patient.  Will check TSH.  No leukocytosis.  Renal panel revealed potassium of 5.6 and serum creatinine of 1.75.  Patient is at risk of contrast dye associated nephropathy.  06/21/2024: Patient seen.  Orthopedic surgery input is appreciated.  Orthopedic surgery is considering left first ray amputation.   Assessment & Plan:   Principal Problem:   Diabetic foot ulcer (HCC) Active Problems:   DM2 (diabetes mellitus, type 2) (HCC)   Essential hypertension   COPD (chronic obstructive pulmonary disease) with emphysema (HCC)   Elevated troponin   Tobacco abuse   CKD (chronic kidney disease) stage 3, GFR 30-59 ml/min (HCC)   Acute on chronic systolic CHF (congestive heart failure) (HCC)   Gangrene of toe of left foot (HCC)   CKD (chronic kidney disease) stage 3b: - Renal function is relatively stable.  Serum creatinine of 1.75 today. - Patient has been exposed to contrast dye. - Patient is at risk for contrast associated nephropathy. - Continue to hold furosemide . - Avoid nephrotoxins whenever possible. - Dose all medications, considering impaired  renal function. - Keep MAP greater than 65 mmHg. - Monitor renal function and electrolytes daily.  Hyperkalemia: - Potassium of 5.3 today.   - Lokelma  10 g p.o. x 1 dose. - Repeat renal panel in the morning.    COPD (chronic obstructive pulmonary disease) with emphysema (HCC) Chronic stable will continue home medications   Ischemic/diabetic foot ulcer (HCC): -Plain images showed no evidence of osteomyelitis. - See vascular surgery procedure and documentation  - Orthopedic surgery team consulted.   - Continue oral Zyvox  and IV Unasyn .   06/21/2024: Orthopedic surgery input is appreciated.  As per orthopedic surgery team, patient will need left facial amputation.   DM2 (diabetes mellitus, type 2) (HCC) Sliding scale insulin  as per protocol.   Elevated troponin Chronic stable patient denies any chest pain    Essential hypertension Continue to monitor closely.   Acute on chronic systolic CHF (congestive heart failure) (HCC) - Seems compensated. - Will continue to hold Lasix .  Patient has been exposed to contrast dye.   Tobacco abuse  - Counseled to quit.    DVT prophylaxis: Subcutaneous heparin  Code Status: Full code Family Communication:  Disposition Plan:    Consultants:  Vascular surgery team. Orthopedic surgery  Procedures:  1) Ultrasound guided right common femoral artery access  2) Aortogram with evaluation of the renal arteries 3) Left lower extremity angiogram with second order cannulation  4) Additional left lower extremity with third order cannulation 5) Ultrasound guided retrograde left posterior tibial access at the ankle 6) Complex angioplasty and stenting of the left femoropopliteal artery (6x125mm Eluvia x2) 7) Complex angioplasty of the left tibioperoneal trunk (3x171mm  Sterling) 8) Complex angioplasty of the left posterior tibial artery (3x153mm Sterling) 9) Conscious sedation (42 minutes)  Antimicrobials:  IV vancomycin  discontinued. Oral  Zyvox . IV Unasyn    Subjective: No new complaints.  Objective: Vitals:   06/21/24 0500 06/21/24 0751 06/21/24 1208 06/21/24 1623  BP:  95/68 120/86 114/77  Pulse:  82 82 75  Resp:  17 17 19   Temp:  97.7 F (36.5 C) 97.8 F (36.6 C) (!) 97.5 F (36.4 C)  TempSrc:  Oral Oral Oral  SpO2:  100% 95% 99%  Weight: 55.2 kg     Height:        Intake/Output Summary (Last 24 hours) at 06/21/2024 1923 Last data filed at 06/21/2024 1208 Gross per 24 hour  Intake 716.19 ml  Output 1115 ml  Net -398.81 ml   Filed Weights   06/18/24 1514 06/20/24 0500 06/21/24 0500  Weight: 49.9 kg 55.9 kg 55.2 kg    Examination:  General exam: Appears calm and comfortable.  Patient is thin Respiratory system: Clear to auscultation. Respiratory effort normal. Cardiovascular system: S1 & S2 heard Gastrointestinal system: Abdomen is soft and nontender.  Central nervous system: Alert and oriented.  Extremities: Ischemic ulcer plantar surface of left first toe.  Data Reviewed: I have personally reviewed following labs and imaging studies  CBC: Recent Labs  Lab 06/18/24 1601 06/19/24 0404 06/20/24 1939  WBC 7.0 7.9 8.3  NEUTROABS 5.7  --  6.9  HGB 12.7* 13.3 11.9*  HCT 37.1* 38.3* 33.4*  MCV 98.1 97.5 96.0  PLT 131* 128* 128*   Basic Metabolic Panel: Recent Labs  Lab 06/18/24 1601 06/19/24 0404 06/20/24 0338 06/20/24 1115 06/20/24 1939 06/21/24 1330  NA 141 142  --  137 138 136  K 5.1 4.9  --  5.7* 5.6* 5.3*  CL 110 107  --  103 103 103  CO2 19* 24  --  20* 25 22  GLUCOSE 186* 80  --  100* 156* 190*  BUN 37* 38*  --  41* 42* 42*  CREATININE 1.59* 1.63*  --  1.73* 1.75* 1.75*  CALCIUM  8.9 9.1  --  8.6* 8.8* 8.6*  MG 1.9 1.8 1.7  --   --   --   PHOS 3.1 2.6  --  3.5 3.3 3.4   GFR: Estimated Creatinine Clearance: 23.2 mL/min (A) (by C-G formula based on SCr of 1.75 mg/dL (H)). Liver Function Tests: Recent Labs  Lab 06/19/24 0404 06/20/24 1115 06/20/24 1939 06/21/24 1330  AST  24  --   --   --   ALT 32  --   --   --   ALKPHOS 128*  --   --   --   BILITOT 0.6  --   --   --   PROT 6.5  --   --   --   ALBUMIN 4.0 3.6 3.7 3.5   No results for input(s): LIPASE, AMYLASE in the last 168 hours. No results for input(s): AMMONIA in the last 168 hours. Coagulation Profile: No results for input(s): INR, PROTIME in the last 168 hours. Cardiac Enzymes: Recent Labs  Lab 06/18/24 1601  CKTOTAL 72   BNP (last 3 results) Recent Labs    06/18/24 1601  PROBNP 23,026.0*   HbA1C: No results for input(s): HGBA1C in the last 72 hours.  CBG: Recent Labs  Lab 06/20/24 2023 06/21/24 0555 06/21/24 0801 06/21/24 1210 06/21/24 1626  GLUCAP 136* 97 124* 145* 151*   Lipid Profile: Recent Labs  06/20/24 0500  CHOL 151  HDL 53  LDLCALC 87  TRIG 58  CHOLHDL 2.9   Thyroid  Function Tests: Recent Labs    06/20/24 1939  TSH 1.390   Anemia Panel: Recent Labs    06/18/24 2200 06/19/24 0404  VITAMINB12  --  642  FOLATE  --  4.9*  FERRITIN 167  --   TIBC 242*  --   IRON 63  --    Urine analysis:    Component Value Date/Time   COLORURINE YELLOW 05/31/2024 1803   APPEARANCEUR CLEAR 05/31/2024 1803   LABSPEC 1.013 05/31/2024 1803   PHURINE 5.0 05/31/2024 1803   GLUCOSEU NEGATIVE 05/31/2024 1803   GLUCOSEU NEGATIVE 02/11/2012 0931   HGBUR SMALL (A) 05/31/2024 1803   BILIRUBINUR NEGATIVE 05/31/2024 1803   KETONESUR NEGATIVE 05/31/2024 1803   PROTEINUR 30 (A) 05/31/2024 1803   UROBILINOGEN 0.2 06/20/2022 1043   NITRITE NEGATIVE 05/31/2024 1803   LEUKOCYTESUR NEGATIVE 05/31/2024 1803   Sepsis Labs: @LABRCNTIP (procalcitonin:4,lacticidven:4)  ) Recent Results (from the past 240 hours)  Culture, blood (Routine X 2) w Reflex to ID Panel     Status: None (Preliminary result)   Collection Time: 06/20/24  7:39 PM   Specimen: BLOOD  Result Value Ref Range Status   Specimen Description BLOOD SITE NOT SPECIFIED  Final   Special Requests   Final     BOTTLES DRAWN AEROBIC AND ANAEROBIC Blood Culture adequate volume   Culture   Final    NO GROWTH < 12 HOURS Performed at [facility], [address] [place], [place] [address]    Report Status PENDING  Incomplete  Culture, blood (Routine X 2) w Reflex to ID Panel     Status: None (Preliminary result)   Collection Time: 06/20/24  7:51 PM   Specimen: BLOOD  Result Value Ref Range Status   Specimen Description BLOOD SITE NOT SPECIFIED  Final   Special Requests   Final    BOTTLES DRAWN AEROBIC AND ANAEROBIC Blood Culture adequate volume   Culture   Final    NO GROWTH < 12 HOURS Performed at [facility], [address], [place], [place] [address]    Report Status PENDING  Incomplete         Radiology Studies: ECHOCARDIOGRAM COMPLETE Result Date: 06/21/2024    ECHOCARDIOGRAM REPORT   Patient Name:   Loras Grieshop. Date of Exam: 06/21/2024 Medical Rec #:  [id number]          Height:       67.0 in Accession #:    [id number]         Weight:       121.7 lb Date of Birth:  01/30/1935         BSA:          1.637 m Patient Age:    89 years           BP:           95/68 mmHg Patient Gender: M                  HR:           82 bpm. Exam Location:  Inpatient Procedure: 2D Echo, 3D Echo, Cardiac Doppler and Color Doppler (Both Spectral            and Color Flow Doppler were utilized during procedure). Indications:    CHF- Acute Systolic I50.21  History:  Patient has prior history of Echocardiogram examinations, most                 recent 03/13/2024. CKD; Risk Factors:Diabetes, Hypertension and                 Dyslipidemia.  Sonographer:    Koleen Popper RDCS Referring Phys: ANASTASSIA DOUTOVA  Sonographer Comments: Image acquisition challenging due to patient body habitus. IMPRESSIONS  1. Left ventricular ejection fraction, by estimation, is 25 to 30%. The left ventricle has severely decreased function. The left ventricle demonstrates global hypokinesis. There is moderate  concentric left ventricular hypertrophy. Left ventricular diastolic function could not be evaluated.  2. Right ventricular systolic function is moderately reduced. The right ventricular size is normal. Severely increased right ventricular wall thickness. There is mildly elevated pulmonary artery systolic pressure. The estimated right ventricular systolic pressure is 42.3 mmHg.  3. Moderate pericardial effusion. The pericardial effusion is circumferential and anterior to the right ventricle.  4. The mitral valve is degenerative. Trivial mitral valve regurgitation. No evidence of mitral stenosis.  5. The aortic valve is calcified. There is moderate calcification of the aortic valve. There is moderate thickening of the aortic valve. Aortic valve regurgitation is not visualized. Aortic valve sclerosis/calcification is present, without any evidence of aortic stenosis.  6. The inferior vena cava is dilated in size with >50% respiratory variability, suggesting right atrial pressure of 8 mmHg.  7. Given findings of severe biventricular hypertrophy with speckeled pattern, biventricular failure, biatrial enlargement and pericardial effusion, consider amyloidosis FINDINGS  Left Ventricle: Left ventricular ejection fraction, by estimation, is 25 to 30%. The left ventricle has severely decreased function. The left ventricle demonstrates global hypokinesis. The left ventricular internal cavity size was normal in size. There is moderate concentric left ventricular hypertrophy. Left ventricular diastolic function could not be evaluated. Right Ventricle: The right ventricular size is normal. Severely increased right ventricular wall thickness. Right ventricular systolic function is moderately reduced. There is mildly elevated pulmonary artery systolic pressure. The tricuspid regurgitant velocity is 2.93 m/s, and with an assumed right atrial pressure of 8 mmHg, the estimated right ventricular systolic pressure is 42.3 mmHg. Left  Atrium: Left atrial size was normal in size. Right Atrium: Right atrial size was normal in size. Pericardium: A moderately sized pericardial effusion is present. The pericardial effusion is circumferential and anterior to the right ventricle. Mitral Valve: The mitral valve is degenerative in appearance. There is moderate thickening of the mitral valve leaflet(s). Trivial mitral valve regurgitation. No evidence of mitral valve stenosis. Tricuspid Valve: The tricuspid valve is normal in structure. Tricuspid valve regurgitation is mild . No evidence of tricuspid stenosis. Aortic Valve: The aortic valve is calcified. There is moderate calcification of the aortic valve. There is moderate thickening of the aortic valve. Aortic valve regurgitation is not visualized. Aortic valve sclerosis/calcification is present, without any  evidence of aortic stenosis. Pulmonic Valve: The pulmonic valve was normal in structure. Pulmonic valve regurgitation is not visualized. No evidence of pulmonic stenosis. Aorta: The aortic root is normal in size and structure. Venous: The inferior vena cava is dilated in size with greater than 50% respiratory variability, suggesting right atrial pressure of 8 mmHg. IAS/Shunts: The interatrial septum appears to be lipomatous. No atrial level shunt detected by color flow Doppler.  LEFT VENTRICLE PLAX 2D LVIDd:         3.80 cm      Diastology LVIDs:         3.25 cm  LV e' medial:   3.85 cm/s LV PW:         1.40 cm      LV E/e' medial: 26.2 LV IVS:        1.60 cm LVOT diam:     1.80 cm LV SV:         23 LV SV Index:   14 LVOT Area:     2.54 cm  LV Volumes (MOD) LV vol d, MOD A4C: 113.0 ml LV vol s, MOD A4C: 79.9 ml LV SV MOD A4C:     113.0 ml RIGHT VENTRICLE            IVC RV S prime:     9.96 cm/s  IVC diam: 2.10 cm TAPSE (M-mode): 1.1 cm LEFT ATRIUM           Index LA diam:      3.60 cm 2.20 cm/m LA Vol (A2C): 43.6 ml 26.63 ml/m LA Vol (A4C): 54.0 ml 32.98 ml/m  AORTIC VALVE LVOT Vmax:   57.80  cm/s LVOT Vmean:  36.400 cm/s LVOT VTI:    0.089 m  AORTA Ao Root diam: 3.50 cm MITRAL VALVE                TRICUSPID VALVE MV Area (PHT): 5.38 cm     TR Peak grad:   34.3 mmHg MV Decel Time: 141 msec     TR Vmax:        293.00 cm/s MV E velocity: 101.00 cm/s MV A velocity: 39.30 cm/s   SHUNTS MV E/A ratio:  2.57         Systemic VTI:  0.09 m                             Systemic Diam: 1.80 cm Wilbert Bihari MD Electronically signed by Wilbert Bihari MD Signature Date/Time: 06/21/2024/11:15:17 AM    Final         Scheduled Meds:  aspirin  EC  81 mg Oral Daily   atorvastatin   40 mg Oral Daily   clopidogrel   75 mg Oral Q breakfast   feeding supplement  237 mL Oral BID BM   heparin  injection (subcutaneous)  5,000 Units Subcutaneous Q12H   insulin  aspart  0-5 Units Subcutaneous QHS   insulin  aspart  0-6 Units Subcutaneous TID WC   linezolid   600 mg Oral Q12H   sodium chloride  flush  3 mL Intravenous Q12H   sodium chloride  flush  3 mL Intravenous Q12H   Continuous Infusions:  ampicillin -sulbactam (UNASYN ) IV 3 g (06/21/24 1246)     LOS: 3 days    Time spent: 35 minutes.    Leatrice Chapel, MD  [facility] 7PM-7AM contact night coverage as above    "

## 2024-06-21 NOTE — Progress Notes (Signed)
 PHARMACIST LIPID MONITORING   Vincent Baxter. is a 89 y.o. male admitted on 06/18/2024 with PAD s/p angioplasty and stenting of left lower extremity.  Pharmacy has been consulted to optimize lipid-lowering therapy with the indication of secondary prevention for clinical ASCVD.  Recent Labs:  Lipid Panel (last 6 months):   Lab Results  Component Value Date   CHOL 151 06/20/2024   TRIG 58 06/20/2024   HDL 53 06/20/2024   CHOLHDL 2.9 06/20/2024   VLDL 12 06/20/2024   LDLCALC 87 06/20/2024    Hepatic function panel (last 6 months):   Lab Results  Component Value Date   AST 24 06/19/2024   ALT 32 06/19/2024   ALKPHOS 128 (H) 06/19/2024   BILITOT 0.6 06/19/2024    SCr (since admission):   Serum creatinine: 1.75 mg/dL (H) 98/96/73 8060 Estimated creatinine clearance: 23.2 mL/min (A)  Current therapy and lipid therapy tolerance Current lipid-lowering therapy: Atorvastatin  40 mg PO daily (started this admission 1/2) Previous lipid-lowering therapies (if applicable): None documented Documented or reported allergies or intolerances to lipid-lowering therapies (if applicable): None documented  Assessment:   Patient is an 40 yoM with PAD s/p left lower extremity stenting. LDL 87 (slightly above goal <70), but statin-naive so expect atorvastatin  to get him to goal. LFTs WNL.  Plan:   Continue atorvastatin  40 mg daily  Izetta Carl, PharmD PGY1 Pharmacy Resident

## 2024-06-21 NOTE — Progress Notes (Signed)
" °  Echocardiogram 2D Echocardiogram has been performed.  Koleen KANDICE Popper, RDCS 06/21/2024, 10:26 AM "

## 2024-06-21 NOTE — Consult Note (Signed)
 "   ORTHOPAEDIC CONSULTATION  REQUESTING PHYSICIAN: Rosario Leatrice FERNS, MD  Chief Complaint: Gangrenous ulcer left great toe  HPI: Vincent Baxter. is a 89 y.o. male who presents with gangrenous ulcer left great toe.  Patient is status post extensive endovascular reconstruction to revascularize the left lower extremity.  Patient is a longtime smoker.  Patient is diabetic with high cholesterol and hypertension.  Past Medical History:  Diagnosis Date   Diabetes mellitus 01/2009 dx   DYSLIPIDEMIA    GERD    Hypertension    Kidney stones    RENAL CALCULUS 01/04/2009   Past Surgical History:  Procedure Laterality Date   NO PAST SURGERIES     Social History   Socioeconomic History   Marital status: Widowed    Spouse name: Not on file   Number of children: Not on file   Years of education: Not on file   Highest education level: Not on file  Occupational History   Not on file  Tobacco Use   Smoking status: Every Day    Current packs/day: 0.50    Types: Cigarettes   Smokeless tobacco: Never   Tobacco comments:    Retired cabin crew, Lives alone, 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   Social Drivers of Health   Tobacco Use: High Risk (06/18/2024)   Patient History    Smoking Tobacco Use: Every Day    Smokeless Tobacco Use: Never    Passive Exposure: Not on file  Financial Resource Strain: Not on file  Food Insecurity: No Food Insecurity (06/19/2024)   Epic    Worried About Programme Researcher, Broadcasting/film/video in the Last Year: Never true    Ran Out of Food in the Last Year: Never true  Transportation Needs: No Transportation Needs (06/19/2024)   Epic    Lack of Transportation (Medical): No    Lack of Transportation (Non-Medical): No  Physical  Activity: Not on file  Stress: Not on file  Social Connections: Socially Isolated (06/19/2024)   Social Connection and Isolation Panel    Frequency of Communication with Friends and Family: Once a week    Frequency of Social Gatherings with Friends and Family: Once a week    Attends Religious Services: Never    Database Administrator or Organizations: No    Attends Banker Meetings: Never    Marital Status: Never married  Depression (PHQ2-9): Not on file  Alcohol Screen: Not on file  Housing: Low Risk (06/19/2024)   Epic    Unable to Pay for Housing in the Last Year: No    Number of Times Moved in the Last Year: 0    Homeless in the Last Year: No  Utilities: Not At Risk (06/19/2024)   Epic    Threatened with loss of utilities: No  Health Literacy: Not on file   Family History  Problem Relation Age of Onset   Hypertension Mother    - negative except otherwise stated in the family history section Allergies[1] Prior to Admission medications  Not on File   PERIPHERAL VASCULAR CATHETERIZATION Result Date: 06/19/2024 DATE OF SERVICE: 06/19/2024  PATIENT:  Vincent Baxter.  89 y.o.  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 ulceration  POST-OPERATIVE DIAGNOSIS:  Same  PROCEDURE:  1) Ultrasound guided right common femoral artery access 2) Aortogram with evaluation of the renal arteries 3) Left lower extremity angiogram with second order cannulation 4) Additional left lower extremity with third order cannulation 5) Ultrasound guided retrograde left posterior tibial access at the ankle 6) Complex angioplasty and stenting of the left femoropopliteal artery (6x112mm Eluvia x2) 7) Complex angioplasty of the left tibioperoneal trunk (3x193mm Sterling) 8) Complex angioplasty of the left posterior tibial artery (3x160mm Sterling) 9) Conscious sedation (42 minutes)  SURGEON:  Debby SAILOR. Magda, MD  ASSISTANT: none  ANESTHESIA:   local and IV  sedation  ESTIMATED BLOOD LOSS: min  LOCAL MEDICATIONS USED:  LIDOCAINE   COUNTS: confirmed correct.  PATIENT DISPOSITION:  PACU - hemodynamically stable.  Delay start of Pharmacological VTE agent (>24hrs) due to surgical blood loss or risk of bleeding: no  INDICATION FOR PROCEDURE: Vincent Baxter. is a 89 y.o. male with left great toe ischemic ulceration in the setting of severe peripheral arterial disease. After careful discussion of risks, benefits, and alternatives the patient was offered angiography with possible intervention. The patient understood and wished to proceed.  OPERATIVE FINDINGS:  Globally slow transit of contrast worrisome for heart failure.  Aortogram: Renal arteries Left: >60% stenosis    Right: >60% stenosis Infrarenal aorta: patent Common iliac arteries: Left: patent      Right: patent Internal iliac arteries: Left: patent      Right: patent External iliac arteries: Left: patent      Right: patent  Left Lower Extremity Angiogram:             Common femoral artery: patent             Profunda femoris artery: two, codominant profunda femoris arteries that are patent             Superficial femoral artery: patent proximally; occluded in mid vessel. Severely diseased throughout the distal vessel. Calcified lesion.             Popliteal artery: reconstitutes above the knee. Severely diseased above and behind the knee. Calcified.             Anterior tibial artery: patent proximally. Occludes in proximal calf. DP reconstitutes in the foot.             Tibioperoneal trunk: occluded             Peroneal artery: occluded             Posterior tibial artery: occluded proximally. Reconstitutes in the calf. Dominant tibial. Fills the foot             Pedal circulation: Disadvantaged  GLASS score. FP: 3. IP: 3  WIfI score. Wound: 2; ischemia: 3; infection: 0. Stage: 4  DESCRIPTION OF PROCEDURE: After identification of the patient in the pre-operative holding area, the patient was transferred to the  operating room. The patient was positioned supine on the operating room table. Anesthesia was induced. The groins was prepped and draped in standard fashion. A surgical pause was performed confirming correct patient, procedure, and operative location.  The right groin was anesthetized with subcutaneous injection of 1% lidocaine . Using ultrasound guidance, the right common femoral artery was accessed with micropuncture technique.  Fluoroscopy was used to confirm cannulation over the femoral head. The 63F micropuncture sheath was upsized to 51F.  A Donnel  wire was advanced into the distal aorta. Over the wire an omni flush catheter was advanced to the level of L2. Aortogram was performed - see above for details.  The left common iliac artery was selected with an omniflush catheter and glidewire advantage guidewire. The wire was advanced into the common femoral artery. Over the wire the omni flush catheter was advanced into the external iliac artery. Selective angiography was performed - see above for details.  The decision was made to intervene. The patient was heparinized with 6000 units of heparin . The 79F sheath was exchanged for a 105F x 45cm sheath. Selective angiography of the left lower extremity performed prior to intervention.  I was able to cross the femoral-popliteal occlusion antegrade.  Below the knee there was a flush occlusion at the origin of the tibioperoneal trunk which I could not cross antegrade.  Posterior tibial artery at the ankle was accessed in a retrograde fashion using ultrasound access with a micropuncture kit.  After obtaining access, wire was navigated into the posterior tibial artery and a micro sheath introduced into the artery.  An 018 12 g weighted tip shepherd wire was used to cross the posterior tibial and tibioperoneal trunk occlusion.  After confirming our position in the true lumen the 018 wire was snared from the contralateral access with a lasso type snare.  The wire was  externalized through the right common femoral artery access sheath.  We then began our intervention.  The posterior tibial artery was angioplastied up to the sheath with a 3 x 150 mm Sterling balloon.  The tibioperoneal trunk was similarly angioplastied with a 3 x 150 mm Sterling balloon.  Two 6 x 150 mm Eluvia stents were deployed in the femoral-popliteal position from above-the-knee to proximal third of SFA.  These were postdilated with a 5 x 150 mm Sterling balloon.  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 Flow to the level of the sheath was achieved.  A Perclose was used to close the arteriotomy in the right common femoral artery. Hemostasis was excellent upon completion. The left posterior tibial sheath was left in place to be removed in the recovery area.  Conscious sedation was administered with the use of IV fentanyl  and midazolam  under continuous physician and nurse monitoring.  Heart rate, blood pressure, and oxygen saturation were continuously monitored.  Total sedation time was 42 minutes  Upon completion of the case instrument and sharps counts were confirmed correct. The patient was transferred to the PACU in good condition. I was present for all portions of the procedure.  PLAN: Aspirin , Plavix , statin therapies.  Optimized from a vascular standpoint.  He is cleared to undergo any kind of foot surgery necessary.  Follow-up with me in 4 weeks with ABI and left lower extremity arterial duplex.  Debby SAILOR. Magda, MD [facility] Phone Number: [phone] 06/19/2024 4:18 PM    - pertinent xrays, CT, MRI studies were reviewed and independently interpreted  Positive ROS: All other systems have been reviewed and were otherwise negative with the exception of those mentioned in the HPI and as above.  Physical  Exam: General: Alert, no acute distress Psychiatric: Patient is competent for consent with normal mood and affect Lymphatic: No axillary or cervical lymphadenopathy Cardiovascular: No pedal edema Respiratory: No cyanosis, no use of accessory musculature GI: No organomegaly, abdomen is soft and non-tender  Images:  @ENCIMAGES @  Labs:  Lab Results  Component Value Date   HGBA1C 6.8 (H) 06/18/2024   HGBA1C 6.4 (H) 03/13/2024   HGBA1C 6.3 (H) 06/20/2022   ESRSEDRATE 5 06/18/2024   CRP <0.5 06/18/2024   REPTSTATUS PENDING 06/20/2024   CULT  06/20/2024    NO GROWTH < 12 HOURS Performed at [facility], [address] [place], [place] [address]     Lab Results  Component Value Date   ALBUMIN 3.7 06/20/2024   ALBUMIN 3.6 06/20/2024   ALBUMIN 4.0 06/19/2024   PREALBUMIN 7 (L) 06/19/2024        Latest Ref Rng & Units 06/20/2024    7:39 PM 06/19/2024    4:04 AM 06/18/2024    4:01 PM  CBC EXTENDED  WBC 4.0 - 10.5 K/uL 8.3  7.9  7.0   RBC 4.22 - 5.81 MIL/uL 3.48  3.93  3.78    3.75   Hemoglobin 13.0 - 17.0 g/dL 88.0  86.6  87.2   HCT 39.0 - 52.0 % 33.4  38.3  37.1   Platelets 150 - 400 K/uL 128  128  131   NEUT# 1.7 - 7.7 K/uL 6.9   5.7   Lymph# 0.7 - 4.0 K/uL 0.7   0.7     Neurologic: Patient does not have protective sensation bilateral lower extremities.   MUSCULOSKELETAL:   Skin: Examination there is a black gangrenous ulcer on the plantar aspect of the IP joint of the left great toe.  There are skin ischemic changes up to the MTP joint.  There is no sausage digit swelling no ascending cellulitis no signs of an abscess.  Hemoglobin stable at 11.9 with a most recent hemoglobin A1c of 6.8.  Assessment: Assessment: Ischemic gangrenous ulcer left great toe status post extensive revascularization to the left lower extremity.  Plan: Plan: Recommended observation at this time.  Surgically patient would require a first ray amputation and he is at high risk of this  not healing.  I will follow-up after discharge.  Discussed the importance of never smoking again.  Thank you for the consult and the opportunity to see Mr. Vincent Hustead, MD [facility] [phone] 9:52 AM        [1] No Known Allergies  "

## 2024-06-22 DIAGNOSIS — E08621 Diabetes mellitus due to underlying condition with foot ulcer: Secondary | ICD-10-CM | POA: Diagnosis not present

## 2024-06-22 DIAGNOSIS — L97403 Non-pressure chronic ulcer of unspecified heel and midfoot with necrosis of muscle: Secondary | ICD-10-CM | POA: Diagnosis not present

## 2024-06-22 LAB — BASIC METABOLIC PANEL WITH GFR
Anion gap: 11 (ref 5–15)
BUN: 45 mg/dL — ABNORMAL HIGH (ref 8–23)
CO2: 21 mmol/L — ABNORMAL LOW (ref 22–32)
Calcium: 8.3 mg/dL — ABNORMAL LOW (ref 8.9–10.3)
Chloride: 103 mmol/L (ref 98–111)
Creatinine, Ser: 1.96 mg/dL — ABNORMAL HIGH (ref 0.61–1.24)
GFR, Estimated: 32 mL/min — ABNORMAL LOW
Glucose, Bld: 155 mg/dL — ABNORMAL HIGH (ref 70–99)
Potassium: 5.3 mmol/L — ABNORMAL HIGH (ref 3.5–5.1)
Sodium: 135 mmol/L (ref 135–145)

## 2024-06-22 LAB — GLUCOSE, CAPILLARY
Glucose-Capillary: 74 mg/dL (ref 70–99)
Glucose-Capillary: 148 mg/dL — ABNORMAL HIGH (ref 70–99)
Glucose-Capillary: 213 mg/dL — ABNORMAL HIGH (ref 70–99)
Glucose-Capillary: 147 mg/dL — ABNORMAL HIGH (ref 70–99)
Glucose-Capillary: 149 mg/dL — ABNORMAL HIGH (ref 70–99)

## 2024-06-22 NOTE — Progress Notes (Signed)
" ° °  Heart Failure Stewardship Pharmacist Progress Note   PCP: [facility] PCP-Cardiologist: None    HPI:  89 yo male with a PMH of HFrEF (EF 25-30%), COPD, T2DM, CKD3b, nephrolithiasis, AAA,  anemia, PVD and hx of THC and tobacco abuse was admitted on 06/18/24 for leg pain and swelling with ulcer.  Patient found to have a gangrenous diabetic foot ulcer on the plantar aspect of the IP joint of the L great toe now s/p extensive revascularization to the L lower extremity on 06/19/24. Ortho is currently recommending a  first ray amputation.  Patient denies SOB, dizziness/lightheadedness, chest pain and difficulty lying flat today. Minimal lower extremity swelling observed on physical exam.   Current HF Medications: None  Prior to admission HF Medications: None  Pertinent Lab Values: Serum creatinine 1.96, BUN 45, Potassium 5.3 (hemolyzed), Sodium 135, proBNP 23,026, Magnesium  1.7, A1c 6.8%  Vital Signs: Weight: 121 lbs (admission weight: 123 lbs) Blood pressure: 100s-110s/80s  Heart rate: 80s-90s  I/O: net -0.3 L yesterday; net -2.7 L since admission  Medication Assistance / Insurance Benefits Check: Does the patient have prescription insurance?  Yes Type of insurance plan: [employer]  Outpatient Pharmacy:  Prior to admission [facility]  Is the patient willing to use [facility] TOC pharmacy at discharge? Yes Is the patient willing to transition their outpatient pharmacy to utilize a [facility] outpatient pharmacy?   Yes    Assessment: 1. Acute chronic systolic CHF (LVEF 25-30%%). NYHA class I symptoms. - Currently not on any GDMT - Currently in AKI -- Scr 1.59 > now up to 1.96   Plan: 1) Medication changes recommended at this time: - Consider initiating metoprolol  25 mg once daily when euvolemic   2) Patient assistance: - None anticipated as patient has [employer]  3)  Education  - To be completed prior to discharge  B. Maegan Marliss Buttacavoli, PharmD PGY-1 Pharmacy  Resident [facility] 06/22/2024 10:25 AM    "

## 2024-06-22 NOTE — Progress Notes (Signed)
 Heart Failure Nurse Navigator Progress Note  PCP: [facility] PCP-Cardiologist: None Admission Diagnosis: Ulcer of left foot, Peripheral edema, elevated troponin.  Admitted from: Home   Presentation:   Vincent Baxter. presented with shortness of breath, bilateral swelling to lower legs, weakness for a week. Has a wound to his left big toe. X-ray does not show any evidence of osteomyelitis. Orthopedic surgery is considering left first ray amputation. 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  Vascular surgery was consulted.  Reportedly he had normal ABIs at that time and work on the monitor of the AAA.BP 128/94, HR 84, Pro BNP 23,026, Creatinine 1.59, Troponin 158, Chest x-ray does not show any evidence of pulmonary edema. EKG does not show any ischemic changes.   Patient was educated on the sign and symptoms of heart failure, daily weights, when to call his doctor or go to the ED. Diet/ fluid restrictions, and taking all medications as prescribed and attending all medical appointments. Patient verbalized his understanding of all education. A HF TOC appointment was scheduled for 07/03/2024 @ 10: 30 am.     ECHO/ LVEF: 25-30%   Clinical Course:  Past Medical History:  Diagnosis Date   Diabetes mellitus 01/2009 dx   DYSLIPIDEMIA    GERD    Hypertension    Kidney stones    RENAL CALCULUS 01/04/2009     Social History   Socioeconomic History   Marital status: Widowed    Spouse name: Not on file   Number of children: Not on file   Years of education: Not on file   Highest education level: Not on file  Occupational History   Not on file  Tobacco Use   Smoking status: Every Day    Current packs/day: 0.50    Types: Cigarettes   Smokeless tobacco: Never   Tobacco comments:    Retired cabin crew, Lives alone, 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   Social Drivers of Health   Tobacco Use: High Risk (06/18/2024)   Patient History    Smoking Tobacco Use: Every Day    Smokeless Tobacco Use: Never    Passive Exposure: Not on file  Financial Resource Strain: Not on file  Food Insecurity: No Food Insecurity (06/19/2024)   Epic    Worried About Programme Researcher, Broadcasting/film/video in the Last Year: Never true    Ran Out of Food in the Last Year: Never true  Transportation Needs: No Transportation Needs (06/19/2024)   Epic    Lack of Transportation (Medical): No    Lack of Transportation (Non-Medical): No  Physical Activity: Not on file  Stress: Not on file  Social Connections: Socially Isolated (06/19/2024)   Social Connection and Isolation Panel    Frequency of Communication with Friends and Family: Once a week    Frequency of Social Gatherings with Friends and Family: Once a week    Attends Religious Services: Never    Database Administrator or Organizations: No    Attends Banker Meetings: Never    Marital Status: Never married  Depression (PHQ2-9): Not on file  Alcohol Screen: Not on file  Housing: Low Risk (06/19/2024)   Epic    Unable to Pay for Housing in the Last Year: No    Number of Times Moved in the Last Year: 0    Homeless in the Last Year: No  Utilities: Not At Risk (06/19/2024)   Epic    Threatened with loss of utilities: No  Health Literacy: Not on file   Education Assessment and Provision:  Detailed education and instructions provided on heart failure disease management including the following:  Signs and symptoms of Heart Failure When to call the physician Importance of daily weights Low sodium diet Fluid restriction Medication management Anticipated future follow-up appointments  Patient education given on  each of the above topics.  Patient acknowledges understanding via teach back method and acceptance of all instructions.  Education Materials:  Living Better With Heart Failure Booklet, HF zone tool, & Daily Weight Tracker Tool.  Patient has scale at home: No, will buy one Patient has pill box at home: Yes    High Risk Criteria for Readmission and/or Poor Patient Outcomes: Heart failure hospital admissions (last 6 months): 0  No Show rate: 9 Difficult social situation: No, lives alone  Demonstrates medication adherence: Yes Primary Language: English  Literacy level: Reading, writing, and comprehension   Barriers of Care:   Diet/ fluid restriction ( salt use/ fluid amounts)  Daily weights  Considerations/Referrals:   Referral made to Heart Failure Pharmacist Stewardship: Yes Referral made to Heart Failure CSW/NCM TOC: NA Referral made to Heart & Vascular TOC clinic: Yes, 07/03/2024 @ 10:30 am   Items for Follow-up on DC/TOC: Continued HF education Diet/ fluid restrictions/ daily weights   Stephane Haddock, BSN, RN Heart Failure Teacher, Adult Education Only

## 2024-06-22 NOTE — TOC Progression Note (Signed)
 Transition of Care (TOC) - Progression Note    Patient Details  Name: Vincent Baxter. MRN: [id number] Date of Birth: 1935-03-03  Transition of Care [facility]) CM/SW Contact  Montie LOISE Vicci, [place] Phone Number: 06/22/2024, 4:36 PM  Clinical Narrative:     CSW met with patient at bedside. CSW introduce self and explained role. Patient confirms he remains agreeable to rehab at [facility]. CSW provided patient with bed offers w/ Medicare.gov ratings. Patient accepted bed offer w/[facility].  CSW sent message to [facility] to confirmed bed offer - waiting on confirmation.   Patient requested NOT to contact his nephew Bracy Pepper- he states his Hildegarde will visit and he will keep him updated. Patient states he drove his car to the hospital but he wanted CSW to wait before contacting security( to let them know about his car).  TOC will continue to follow and assist with discharge planning.   Montie Vicci, MSW, LCSW Clinical Social Worker     Expected Discharge Plan: Skilled Nursing Facility Barriers to Discharge: Continued Medical Work up               Expected Discharge Plan and Services                                               Social Drivers of Health (SDOH) Interventions SDOH Screenings   Food Insecurity: No Food Insecurity (06/19/2024)  Housing: Low Risk (06/19/2024)  Transportation Needs: No Transportation Needs (06/19/2024)  Utilities: Not At Risk (06/19/2024)  Social Connections: Socially Isolated (06/19/2024)  Tobacco Use: High Risk (06/18/2024)    Readmission Risk Interventions     No data to display

## 2024-06-22 NOTE — Plan of Care (Signed)
   Problem: Education: Goal: Ability to describe self-care measures that may prevent or decrease complications (Diabetes Survival Skills Education) will improve Outcome: Progressing   Problem: Coping: Goal: Ability to adjust to condition or change in health will improve Outcome: Progressing   Problem: Fluid Volume: Goal: Ability to maintain a balanced intake and output will improve Outcome: Progressing

## 2024-06-22 NOTE — Progress Notes (Addendum)
 " PROGRESS NOTE    Vincent Baxter.  FMW:[id number] DOB: [birth year] DOA: 06/18/2024 [facility]: [facility]  Outpatient Specialists:     Brief Narrative:  Patient is an 89 year old male past medical history significant for systolic CHF with EF of 25 to 69%, grade 2 diastolic dysfunction, COPD, type 2 diabetes mellitus, renal mass, nephrolithiasis, abdominal aortic aneurysm, chronic kidney disease stage IIIb, anemia, GERD, BPH, THC and tobacco abuse. Patient was admitted with left lower extremity peripheral artery disease, and left foot ulcer involving the plantar aspect of first left toe.   06/20/2024: Patient seen.  Patient reports feeling cold.  No documented fever.  Patient is status post revascularization of left lower extremity arteries.  Orthopedic team has been consulted for further assessment for possible amputation.  Will panculture patient.  Will check TSH.  No leukocytosis.  Renal panel revealed potassium of 5.6 and serum creatinine of 1.75.  Patient is at risk of contrast dye associated nephropathy.  06/21/2024: Patient seen.  Orthopedic surgery input is appreciated.  Orthopedic surgery is considering left first ray amputation.  06/22/2024: Patient seen.  No new complaints.  See above documentation.  Orthopedic surgery is suggesting a likely left first ray amputation, however, patient seems to be hoping that the wound will heal.   Assessment & Plan:   Principal Problem:   Diabetic foot ulcer (HCC) Active Problems:   DM2 (diabetes mellitus, type 2) (HCC)   Essential hypertension   COPD (chronic obstructive pulmonary disease) with emphysema (HCC)   Elevated troponin   Tobacco abuse   CKD (chronic kidney disease) stage 3, GFR 30-59 ml/min (HCC)   Acute on chronic systolic CHF (congestive heart failure) (HCC)   Gangrene of toe of left foot (HCC)   CKD (chronic kidney disease) stage 3b: - Renal function is relatively stable.  Serum creatinine of 1.96 today. - Patient has  been exposed to contrast dye. - Patient is at risk for contrast associated nephropathy. - Continue to hold furosemide . - Avoid nephrotoxins whenever possible. - Dose all medications, considering impaired renal function. - Keep MAP greater than 65 mmHg. - Monitor renal function and electrolytes daily.  Hyperkalemia: - Potassium of 5.3 today.   - Lokelma  10 g p.o. x 1 dose. - Repeat renal panel in the morning.    COPD (chronic obstructive pulmonary disease) with emphysema (HCC) Chronic stable will continue home medications   Ischemic/diabetic foot ulcer (HCC): -Plain images showed no evidence of osteomyelitis. - See vascular surgery procedure and documentation  - Orthopedic surgery team consulted.   - Continue oral Zyvox  and IV Unasyn .   06/21/2024: Orthopedic surgery input is appreciated.  As per orthopedic surgery team, patient will need left facial amputation.   DM2 (diabetes mellitus, type 2) (HCC) Sliding scale insulin  as per protocol.   Elevated troponin Chronic stable patient denies any chest pain    Essential hypertension Continue to monitor closely.   Acute on chronic systolic CHF (congestive heart failure) (HCC) - Seems compensated. - Will continue to hold Lasix .  Patient has been exposed to contrast dye.   Tobacco abuse  - Counseled to quit.    DVT prophylaxis: Subcutaneous heparin  Code Status: Full code Family Communication:  Disposition Plan:    Consultants:  Vascular surgery team. Orthopedic surgery  Procedures:  1) Ultrasound guided right common femoral artery access  2) Aortogram with evaluation of the renal arteries 3) Left lower extremity angiogram with second order cannulation  4) Additional left lower extremity with third order  cannulation 5) Ultrasound guided retrograde left posterior tibial access at the ankle 6) Complex angioplasty and stenting of the left femoropopliteal artery (6x148mm Eluvia x2) 7) Complex angioplasty of the left  tibioperoneal trunk (3x173mm Sterling) 8) Complex angioplasty of the left posterior tibial artery (3x172mm Sterling) 9) Conscious sedation (42 minutes)  Antimicrobials:  IV vancomycin  discontinued. Oral Zyvox . IV Unasyn    Subjective: No new complaints.  Objective: Vitals:   06/21/24 2358 06/22/24 0440 06/22/24 0500 06/22/24 0849  BP: 113/89 108/88  114/80  Pulse: 99   74  Resp: 16 16  18   Temp:  97.8 F (36.6 C)  (!) 97.3 F (36.3 C)  TempSrc: Oral Oral  Oral  SpO2: 100% 100%  95%  Weight:   55.2 kg   Height:        Intake/Output Summary (Last 24 hours) at 06/22/2024 1116 Last data filed at 06/22/2024 0900 Gross per 24 hour  Intake 200 ml  Output 740 ml  Net -540 ml   Filed Weights   06/20/24 0500 06/21/24 0500 06/22/24 0500  Weight: 55.9 kg 55.2 kg 55.2 kg    Examination:  General exam: Appears calm and comfortable.  Patient is thin Respiratory system: Clear to auscultation. Respiratory effort normal. Cardiovascular system: S1 & S2 heard Gastrointestinal system: Abdomen is soft and nontender.  Central nervous system: Alert and oriented.  Extremities: Ischemic ulcer plantar surface of left first toe.  Data Reviewed: I have personally reviewed following labs and imaging studies  CBC: Recent Labs  Lab 06/18/24 1601 06/19/24 0404 06/20/24 1939  WBC 7.0 7.9 8.3  NEUTROABS 5.7  --  6.9  HGB 12.7* 13.3 11.9*  HCT 37.1* 38.3* 33.4*  MCV 98.1 97.5 96.0  PLT 131* 128* 128*   Basic Metabolic Panel: Recent Labs  Lab 06/18/24 1601 06/19/24 0404 06/20/24 0338 06/20/24 1115 06/20/24 1939 06/21/24 1330 06/22/24 0352  NA 141 142  --  137 138 136 135  K 5.1 4.9  --  5.7* 5.6* 5.3* 5.3*  CL 110 107  --  103 103 103 103  CO2 19* 24  --  20* 25 22 21*  GLUCOSE 186* 80  --  100* 156* 190* 155*  BUN 37* 38*  --  41* 42* 42* 45*  CREATININE 1.59* 1.63*  --  1.73* 1.75* 1.75* 1.96*  CALCIUM  8.9 9.1  --  8.6* 8.8* 8.6* 8.3*  MG 1.9 1.8 1.7  --   --   --   --    PHOS 3.1 2.6  --  3.5 3.3 3.4  --    GFR: Estimated Creatinine Clearance: 20.7 mL/min (A) (by C-G formula based on SCr of 1.96 mg/dL (H)). Liver Function Tests: Recent Labs  Lab 06/19/24 0404 06/20/24 1115 06/20/24 1939 06/21/24 1330  AST 24  --   --   --   ALT 32  --   --   --   ALKPHOS 128*  --   --   --   BILITOT 0.6  --   --   --   PROT 6.5  --   --   --   ALBUMIN 4.0 3.6 3.7 3.5   No results for input(s): LIPASE, AMYLASE in the last 168 hours. No results for input(s): AMMONIA in the last 168 hours. Coagulation Profile: No results for input(s): INR, PROTIME in the last 168 hours. Cardiac Enzymes: Recent Labs  Lab 06/18/24 1601  CKTOTAL 72   BNP (last 3 results) Recent Labs    06/18/24  1601  PROBNP 23,026.0*   HbA1C: No results for input(s): HGBA1C in the last 72 hours.  CBG: Recent Labs  Lab 06/21/24 0801 06/21/24 1210 06/21/24 1626 06/21/24 2112 06/22/24 0616  GLUCAP 124* 145* 151* 139* 148*   Lipid Profile: Recent Labs    06/20/24 0500  CHOL 151  HDL 53  LDLCALC 87  TRIG 58  CHOLHDL 2.9   Thyroid  Function Tests: Recent Labs    06/20/24 1939  TSH 1.390   Anemia Panel: No results for input(s): VITAMINB12, FOLATE, FERRITIN, TIBC, IRON, RETICCTPCT in the last 72 hours.  Urine analysis:    Component Value Date/Time   COLORURINE YELLOW 05/31/2024 1803   APPEARANCEUR CLEAR 05/31/2024 1803   LABSPEC 1.013 05/31/2024 1803   PHURINE 5.0 05/31/2024 1803   GLUCOSEU NEGATIVE 05/31/2024 1803   GLUCOSEU NEGATIVE 02/11/2012 0931   HGBUR SMALL (A) 05/31/2024 1803   BILIRUBINUR NEGATIVE 05/31/2024 1803   KETONESUR NEGATIVE 05/31/2024 1803   PROTEINUR 30 (A) 05/31/2024 1803   UROBILINOGEN 0.2 06/20/2022 1043   NITRITE NEGATIVE 05/31/2024 1803   LEUKOCYTESUR NEGATIVE 05/31/2024 1803   Sepsis Labs: @LABRCNTIP (procalcitonin:4,lacticidven:4)  ) Recent Results (from the past 240 hours)  Culture, blood (Routine X 2) w  Reflex to ID Panel     Status: None (Preliminary result)   Collection Time: 06/20/24  7:39 PM   Specimen: BLOOD  Result Value Ref Range Status   Specimen Description BLOOD SITE NOT SPECIFIED  Final   Special Requests   Final    BOTTLES DRAWN AEROBIC AND ANAEROBIC Blood Culture adequate volume   Culture   Final    NO GROWTH 2 DAYS Performed at [facility], [address], [place], [place] [address]    Report Status PENDING  Incomplete  Culture, blood (Routine X 2) w Reflex to ID Panel     Status: None (Preliminary result)   Collection Time: 06/20/24  7:51 PM   Specimen: BLOOD  Result Value Ref Range Status   Specimen Description BLOOD SITE NOT SPECIFIED  Final   Special Requests   Final    BOTTLES DRAWN AEROBIC AND ANAEROBIC Blood Culture adequate volume   Culture   Final    NO GROWTH 2 DAYS Performed at [facility], [address], [place], [place] [address]    Report Status PENDING  Incomplete         Radiology Studies: ECHOCARDIOGRAM COMPLETE Result Date: 06/21/2024    ECHOCARDIOGRAM REPORT   Patient Name:   Vincent Baxter. Date of Exam: 06/21/2024 Medical Rec #:  [id number]          Height:       67.0 in Accession #:    [id number]         Weight:       121.7 lb Date of Birth:  [birth year]         BSA:          1.637 m Patient Age:    89 years           BP:           95/68 mmHg Patient Gender: M                  HR:           82 bpm. Exam Location:  Inpatient Procedure: 2D Echo, 3D Echo, Cardiac Doppler and Color Doppler (Both Spectral            and  Color Flow Doppler were utilized during procedure). Indications:    CHF- Acute Systolic I50.21  History:        Patient has prior history of Echocardiogram examinations, most                 recent 03/13/2024. CKD; Risk Factors:Diabetes, Hypertension and                 Dyslipidemia.  Sonographer:    Koleen Popper RDCS Referring Phys: ANASTASSIA DOUTOVA  Sonographer Comments: Image acquisition challenging due to patient  body habitus. IMPRESSIONS  1. Left ventricular ejection fraction, by estimation, is 25 to 30%. The left ventricle has severely decreased function. The left ventricle demonstrates global hypokinesis. There is moderate concentric left ventricular hypertrophy. Left ventricular diastolic function could not be evaluated.  2. Right ventricular systolic function is moderately reduced. The right ventricular size is normal. Severely increased right ventricular wall thickness. There is mildly elevated pulmonary artery systolic pressure. The estimated right ventricular systolic pressure is 42.3 mmHg.  3. Moderate pericardial effusion. The pericardial effusion is circumferential and anterior to the right ventricle.  4. The mitral valve is degenerative. Trivial mitral valve regurgitation. No evidence of mitral stenosis.  5. The aortic valve is calcified. There is moderate calcification of the aortic valve. There is moderate thickening of the aortic valve. Aortic valve regurgitation is not visualized. Aortic valve sclerosis/calcification is present, without any evidence of aortic stenosis.  6. The inferior vena cava is dilated in size with >50% respiratory variability, suggesting right atrial pressure of 8 mmHg.  7. Given findings of severe biventricular hypertrophy with speckeled pattern, biventricular failure, biatrial enlargement and pericardial effusion, consider amyloidosis FINDINGS  Left Ventricle: Left ventricular ejection fraction, by estimation, is 25 to 30%. The left ventricle has severely decreased function. The left ventricle demonstrates global hypokinesis. The left ventricular internal cavity size was normal in size. There is moderate concentric left ventricular hypertrophy. Left ventricular diastolic function could not be evaluated. Right Ventricle: The right ventricular size is normal. Severely increased right ventricular wall thickness. Right ventricular systolic function is moderately reduced. There is mildly  elevated pulmonary artery systolic pressure. The tricuspid regurgitant velocity is 2.93 m/s, and with an assumed right atrial pressure of 8 mmHg, the estimated right ventricular systolic pressure is 42.3 mmHg. Left Atrium: Left atrial size was normal in size. Right Atrium: Right atrial size was normal in size. Pericardium: A moderately sized pericardial effusion is present. The pericardial effusion is circumferential and anterior to the right ventricle. Mitral Valve: The mitral valve is degenerative in appearance. There is moderate thickening of the mitral valve leaflet(s). Trivial mitral valve regurgitation. No evidence of mitral valve stenosis. Tricuspid Valve: The tricuspid valve is normal in structure. Tricuspid valve regurgitation is mild . No evidence of tricuspid stenosis. Aortic Valve: The aortic valve is calcified. There is moderate calcification of the aortic valve. There is moderate thickening of the aortic valve. Aortic valve regurgitation is not visualized. Aortic valve sclerosis/calcification is present, without any  evidence of aortic stenosis. Pulmonic Valve: The pulmonic valve was normal in structure. Pulmonic valve regurgitation is not visualized. No evidence of pulmonic stenosis. Aorta: The aortic root is normal in size and structure. Venous: The inferior vena cava is dilated in size with greater than 50% respiratory variability, suggesting right atrial pressure of 8 mmHg. IAS/Shunts: The interatrial septum appears to be lipomatous. No atrial level shunt detected by color flow Doppler.  LEFT VENTRICLE PLAX 2D LVIDd:  3.80 cm      Diastology LVIDs:         3.25 cm      LV e' medial:   3.85 cm/s LV PW:         1.40 cm      LV E/e' medial: 26.2 LV IVS:        1.60 cm LVOT diam:     1.80 cm LV SV:         23 LV SV Index:   14 LVOT Area:     2.54 cm  LV Volumes (MOD) LV vol d, MOD A4C: 113.0 ml LV vol s, MOD A4C: 79.9 ml LV SV MOD A4C:     113.0 ml RIGHT VENTRICLE            IVC RV S prime:      9.96 cm/s  IVC diam: 2.10 cm TAPSE (M-mode): 1.1 cm LEFT ATRIUM           Index LA diam:      3.60 cm 2.20 cm/m LA Vol (A2C): 43.6 ml 26.63 ml/m LA Vol (A4C): 54.0 ml 32.98 ml/m  AORTIC VALVE LVOT Vmax:   57.80 cm/s LVOT Vmean:  36.400 cm/s LVOT VTI:    0.089 m  AORTA Ao Root diam: 3.50 cm MITRAL VALVE                TRICUSPID VALVE MV Area (PHT): 5.38 cm     TR Peak grad:   34.3 mmHg MV Decel Time: 141 msec     TR Vmax:        293.00 cm/s MV E velocity: 101.00 cm/s MV A velocity: 39.30 cm/s   SHUNTS MV E/A ratio:  2.57         Systemic VTI:  0.09 m                             Systemic Diam: 1.80 cm Wilbert Bihari MD Electronically signed by Wilbert Bihari MD Signature Date/Time: 06/21/2024/11:15:17 AM    Final         Scheduled Meds:  aspirin  EC  81 mg Oral Daily   atorvastatin   40 mg Oral Daily   clopidogrel   75 mg Oral Q breakfast   feeding supplement  237 mL Oral BID BM   heparin  injection (subcutaneous)  5,000 Units Subcutaneous Q12H   insulin  aspart  0-5 Units Subcutaneous QHS   insulin  aspart  0-6 Units Subcutaneous TID WC   linezolid   600 mg Oral Q12H   sodium chloride  flush  3 mL Intravenous Q12H   sodium chloride  flush  3 mL Intravenous Q12H   Continuous Infusions:  ampicillin -sulbactam (UNASYN ) IV Stopped (06/22/24 0032)     LOS: 4 days    Time spent: 35 minutes.    Leatrice Chapel, MD  [facility] 7PM-7AM contact night coverage as above    "

## 2024-06-23 ENCOUNTER — Encounter (HOSPITAL_COMMUNITY): Payer: Self-pay | Admitting: Internal Medicine

## 2024-06-23 LAB — GLUCOSE, CAPILLARY
Glucose-Capillary: 137 mg/dL — ABNORMAL HIGH (ref 70–99)
Glucose-Capillary: 152 mg/dL — ABNORMAL HIGH (ref 70–99)
Glucose-Capillary: 229 mg/dL — ABNORMAL HIGH (ref 70–99)

## 2024-06-23 LAB — BASIC METABOLIC PANEL WITH GFR
Anion gap: 8 (ref 5–15)
BUN: 44 mg/dL — ABNORMAL HIGH (ref 8–23)
CO2: 26 mmol/L (ref 22–32)
Calcium: 8.6 mg/dL — ABNORMAL LOW (ref 8.9–10.3)
Chloride: 103 mmol/L (ref 98–111)
Creatinine, Ser: 2.13 mg/dL — ABNORMAL HIGH (ref 0.61–1.24)
GFR, Estimated: 29 mL/min — ABNORMAL LOW
Glucose, Bld: 167 mg/dL — ABNORMAL HIGH (ref 70–99)
Potassium: 5.2 mmol/L — ABNORMAL HIGH (ref 3.5–5.1)
Sodium: 137 mmol/L (ref 135–145)

## 2024-06-23 LAB — RENAL FUNCTION PANEL
Albumin: 3.3 g/dL — ABNORMAL LOW (ref 3.5–5.0)
Anion gap: 8 (ref 5–15)
BUN: 44 mg/dL — ABNORMAL HIGH (ref 8–23)
CO2: 26 mmol/L (ref 22–32)
Calcium: 8.4 mg/dL — ABNORMAL LOW (ref 8.9–10.3)
Chloride: 102 mmol/L (ref 98–111)
Creatinine, Ser: 2.11 mg/dL — ABNORMAL HIGH (ref 0.61–1.24)
GFR, Estimated: 30 mL/min — ABNORMAL LOW
Glucose, Bld: 165 mg/dL — ABNORMAL HIGH (ref 70–99)
Phosphorus: 3.4 mg/dL (ref 2.5–4.6)
Potassium: 5.2 mmol/L — ABNORMAL HIGH (ref 3.5–5.1)
Sodium: 137 mmol/L (ref 135–145)

## 2024-06-23 LAB — POTASSIUM: Potassium: 5.5 mmol/L — ABNORMAL HIGH (ref 3.5–5.1)

## 2024-06-23 LAB — MAGNESIUM: Magnesium: 1.9 mg/dL (ref 1.7–2.4)

## 2024-06-23 MED ORDER — SODIUM ZIRCONIUM CYCLOSILICATE 10 G PO PACK
10.0000 g | PACK | Freq: Every day | ORAL | Status: AC
  Administered 2024-06-23: 10 g via ORAL
  Filled 2024-06-23: qty 1

## 2024-06-23 MED ORDER — METOPROLOL SUCCINATE ER 25 MG PO TB24
25.0000 mg | ORAL_TABLET | Freq: Every day | ORAL | Status: DC
  Administered 2024-06-23 – 2024-06-28 (×4): 25 mg via ORAL
  Filled 2024-06-23 (×7): qty 1

## 2024-06-23 NOTE — Progress Notes (Signed)
" ° °  Heart Failure Stewardship Pharmacist Progress Note   PCP: [facility] PCP-Cardiologist: None    HPI:  89 yo male with a PMH of HFrEF (EF 25-30%), COPD, T2DM, CKD3b, nephrolithiasis, AAA,  anemia, PVD and hx of THC and tobacco abuse was admitted on 06/18/24 for leg pain and swelling with ulcer.  Patient found to have a gangrenous diabetic foot ulcer on the plantar aspect of the IP joint of the L great toe now s/p extensive revascularization to the L lower extremity on 06/19/24. Ortho is currently recommending a  first ray amputation, but it appears that the patient is hoping that the wound will heal without surgical intervention.  Overall, patient is doing well today. He denies SOB, chest pain and difficulty lying flat. No lower extremity swelling observed on physical exam. Patient does endorse mucus/congestion, which seems to be a chronic issue. He also stated that at home he does not typically have SOB, dyspnea on exertion, or chest pain due to not moving around a lot. Patient received 1 dose of Lokelma  on 1/4 for K of 5.3. Repeat K was 5.3, but was hemolyzed. K this morning was 5.2 and a repeat K is still pending. Will follow-up repeat K to see if another dose of Lokelma  is indicated.  Current HF Medications: None  Prior to admission HF Medications: None  Pertinent Lab Values: Serum creatinine 2.11, BUN 45, Potassium 5.2 (repeat K is in progress), Sodium 137, proBNP 23,026 (06/18/24), Magnesium  1.9, A1c 6.8%  Vital Signs: Weight: 110 lbs (admission weight: 110 lbs) Blood pressure: 120s-140/80-90s  Heart rate: 80s-90s  I/O: net -0.5 L yesterday; net -3 L since admission  Medication Assistance / Insurance Benefits Check: Does the patient have prescription insurance?  Yes Type of insurance plan: [employer]/Medicare Part D  Outpatient Pharmacy:  Prior to admission outpatient [facility]  Is the patient willing to use [facility] TOC pharmacy at discharge? Yes Is the patient  willing to transition their outpatient pharmacy to utilize a [facility] outpatient pharmacy?   Yes    Assessment: 1. Acute chronic systolic CHF (LVEF 25-30%%). NYHA class I symptoms. - Currently not on any GDMT - Currently in AKI -- Scr 1.59 > now up to 2.11 - Potassium today was 5.2, which is still elevated after a dose of Lokelma  on 1/4   Plan: 1) Medication changes recommended at this time: - Consider initiating metoprolol  succinate 25 mg once daily since appears to be euvolemic today - Consider Lokelma  10 g x1 if repeat serum potassium is still elevated >5.  2) Patient assistance: - None anticipated as patient has [employer]  3)  Education  - To be completed prior to discharge  B. Maegan Dakiyah Heinke, PharmD PGY-1 Pharmacy Resident  [facility] 06/23/2024 8:28 AM    "

## 2024-06-23 NOTE — Progress Notes (Signed)
 " PROGRESS NOTE    Vincent Baxter.  FMW:[id number] DOB: 14-Oct-1934 DOA: 06/18/2024 PCP: [facility]   Brief Narrative:  This 89 year old male with past medical history significant for systolic CHF with EF of 25 to 69%, grade 2 diastolic dysfunction, COPD, type 2 diabetes mellitus, renal mass, nephrolithiasis, abdominal aortic aneurysm, chronic kidney disease stage IIIb, anemia, GERD, BPH, THC and tobacco abuse. Patient was admitted with left lower extremity peripheral artery disease, and left foot ulcer involving the plantar aspect of first left toe. Patient is status post revascularization of left lower extremity arteries. Orthopedic surgery is considering left first ray amputation. Patient was admitted for further evaluation.  Assessment & Plan:   Principal Problem:   Diabetic foot ulcer (HCC) Active Problems:   DM2 (diabetes mellitus, type 2) (HCC)   Essential hypertension   COPD (chronic obstructive pulmonary disease) with emphysema (HCC)   Elevated troponin   Tobacco abuse   CKD (chronic kidney disease) stage 3, GFR 30-59 ml/min (HCC)   Acute on chronic systolic CHF (congestive heart failure) (HCC)   Gangrene of toe of left foot (HCC)   CKD (chronic kidney disease) stage 3b: Renal function is relatively stable.Serum creatinine 2.11 today. Patient is at risk for contrast associated nephropathy. Continue to hold furosemide . Avoid nephrotoxins whenever possible. Dose all medications, considering impaired renal function. Monitor renal function and electrolytes daily.   Hyperkalemia: Potassium of 5.5 today.   Lokelma  10 g p.o. x 1 dose. Repeat renal panel in the morning.    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 will continue home medications   Ischemic/diabetic foot ulcer (HCC): Plain Images showed no evidence of osteomyelitis. Patient underwent vascular intervention Continue aspirin  Plavix  and a statin.  Patient  is optimized from vascular standpoint. Orthopedic surgery team consulted.   Continue oral Zyvox  and IV Unasyn .   As per orthopedic surgery team, patient will need first ray amputation.   DM2 (diabetes mellitus, type 2) (HCC) Continue Sliding scale insulin  as per protocol.   Elevated troponin Chronic stable.  Patient denies any chest pain    Essential hypertension: Continue to monitor closely.   Acute on chronic systolic CHF (congestive heart failure) (HCC) Seems compensated. Will continue to hold Lasix .  Patient has been exposed to contrast dye.   Tobacco abuse  Counseled to quit.   DVT prophylaxis: Heparin  sq Code Status: Full code Family Communication: No family at bed side. Disposition Plan:    Status is: Inpatient Remains inpatient appropriate because: Patient admitted for infected foot, Ortho is consulted patient is scheduled for first ray amputation.  Patient not medically ready for discharge.   Consultants:  Vascular surgery Orthopedic surgery  Procedures:  1) Ultrasound guided right common femoral artery access  2) Aortogram with evaluation of the renal arteries 3) Left lower extremity angiogram with second order cannulation  4) Additional left lower extremity with third order cannulation 5) Ultrasound guided retrograde left posterior tibial access at the ankle 6) Complex angioplasty and stenting of the left femoropopliteal artery (6x170mm Eluvia x2) 7) Complex angioplasty of the left tibioperoneal trunk (3x124mm Sterling) 8) Complex angioplasty of the left posterior tibial artery (3x15mm Sterling) 9) Conscious sedation (42 minutes)    Antimicrobials: Oral Zyvox  IV Unasyn   Subjective: Patient was seen and examined at bedside.  Overnight events noted. Patient reports feeling better, reports pain is reasonably controlled.    Objective: Vitals:   06/22/24 2245 06/23/24 0306 06/23/24 0645 06/23/24 0738  BP: ROLLEN)  136/98 (!) 141/83  125/86  Pulse: 95 91   79  Resp: 20 20  17   Temp: 97.6 F (36.4 C) 97.6 F (36.4 C)  97.8 F (36.6 C)  TempSrc: Oral Oral  Oral  SpO2: 99% 99%  100%  Weight:   49.9 kg   Height:        Intake/Output Summary (Last 24 hours) at 06/23/2024 1455 Last data filed at 06/23/2024 0259 Gross per 24 hour  Intake 340 ml  Output 250 ml  Net 90 ml   Filed Weights   06/21/24 0500 06/22/24 0500 06/23/24 0645  Weight: 55.2 kg 55.2 kg 49.9 kg    Examination:  General exam: Appears calm and comfortable, not really any acute distress. Respiratory system: Clear to auscultation. Respiratory effort normal. RR 17 Cardiovascular system: S1 & S2 heard, RRR. No JVD, murmurs, rubs, gallops or clicks.  Gastrointestinal system: Abdomen is non distended, soft and non tender. Normal bowel sounds heard. Central nervous system: Alert and oriented x 3. No focal neurological deficits. Extremities: Black gangrenous ulcer on the plantar aspect of the left great toe Skin: No rashes, lesions or ulcers Psychiatry: Judgement and insight appear normal. Mood & affect appropriate.     Data Reviewed: I have personally reviewed following labs and imaging studies  CBC: Recent Labs  Lab 06/18/24 1601 06/19/24 0404 06/20/24 1939  WBC 7.0 7.9 8.3  NEUTROABS 5.7  --  6.9  HGB 12.7* 13.3 11.9*  HCT 37.1* 38.3* 33.4*  MCV 98.1 97.5 96.0  PLT 131* 128* 128*   Basic Metabolic Panel: Recent Labs  Lab 06/18/24 1601 06/19/24 0404 06/20/24 0338 06/20/24 1115 06/20/24 1939 06/21/24 1330 06/22/24 0352 06/23/24 0339 06/23/24 0340 06/23/24 1201  NA 141 142  --  137 138 136 135 137 137  --   K 5.1 4.9  --  5.7* 5.6* 5.3* 5.3* 5.2* 5.2* 5.5*  CL 110 107  --  103 103 103 103 103 102  --   CO2 19* 24  --  20* 25 22 21* 26 26  --   GLUCOSE 186* 80  --  100* 156* 190* 155* 167* 165*  --   BUN 37* 38*  --  41* 42* 42* 45* 44* 44*  --   CREATININE 1.59* 1.63*  --  1.73* 1.75* 1.75* 1.96* 2.13* 2.11*  --   CALCIUM  8.9 9.1  --  8.6* 8.8* 8.6*  8.3* 8.6* 8.4*  --   MG 1.9 1.8 1.7  --   --   --   --  1.9  --   --   PHOS 3.1 2.6  --  3.5 3.3 3.4  --   --  3.4  --    GFR: Estimated Creatinine Clearance: 17.4 mL/min (A) (by C-G formula based on SCr of 2.11 mg/dL (H)). Liver Function Tests: Recent Labs  Lab 06/19/24 0404 06/20/24 1115 06/20/24 1939 06/21/24 1330 06/23/24 0340  AST 24  --   --   --   --   ALT 32  --   --   --   --   ALKPHOS 128*  --   --   --   --   BILITOT 0.6  --   --   --   --   PROT 6.5  --   --   --   --   ALBUMIN 4.0 3.6 3.7 3.5 3.3*   No results for input(s): LIPASE, AMYLASE in the last 168 hours. No results for  input(s): AMMONIA in the last 168 hours. Coagulation Profile: No results for input(s): INR, PROTIME in the last 168 hours. Cardiac Enzymes: Recent Labs  Lab 06/18/24 1601  CKTOTAL 72   BNP (last 3 results) Recent Labs    06/18/24 1601  PROBNP 23,026.0*   HbA1C: No results for input(s): HGBA1C in the last 72 hours. CBG: Recent Labs  Lab 06/22/24 1205 06/22/24 1652 06/22/24 2105 06/23/24 0608 06/23/24 1121  GLUCAP 213* 147* 149* 137* 152*   Lipid Profile: No results for input(s): CHOL, HDL, LDLCALC, TRIG, CHOLHDL, LDLDIRECT in the last 72 hours. Thyroid  Function Tests: Recent Labs    06/20/24 1939  TSH 1.390   Anemia Panel: No results for input(s): VITAMINB12, FOLATE, FERRITIN, TIBC, IRON, RETICCTPCT in the last 72 hours. Sepsis Labs: Recent Labs  Lab 06/18/24 1601  PROCALCITON 0.16    Recent Results (from the past 240 hours)  Culture, blood (Routine X 2) w Reflex to ID Panel     Status: None (Preliminary result)   Collection Time: 06/20/24  7:39 PM   Specimen: BLOOD  Result Value Ref Range Status   Specimen Description BLOOD SITE NOT SPECIFIED  Final   Special Requests   Final    BOTTLES DRAWN AEROBIC AND ANAEROBIC Blood Culture adequate volume   Culture   Final    NO GROWTH 3 DAYS Performed at [facility] [place],  [address], [place], [place] [address]    Report Status PENDING  Incomplete  Culture, blood (Routine X 2) w Reflex to ID Panel     Status: None (Preliminary result)   Collection Time: 06/20/24  7:51 PM   Specimen: BLOOD  Result Value Ref Range Status   Specimen Description BLOOD SITE NOT SPECIFIED  Final   Special Requests   Final    BOTTLES DRAWN AEROBIC AND ANAEROBIC Blood Culture adequate volume   Culture   Final    NO GROWTH 3 DAYS Performed at [facility], [address] [place], [place] [address]    Report Status PENDING  Incomplete    Radiology Studies: No results found.  Scheduled Meds:  aspirin  EC  81 mg Oral Daily   atorvastatin   40 mg Oral Daily   clopidogrel   75 mg Oral Q breakfast   feeding supplement  237 mL Oral BID BM   heparin  injection (subcutaneous)  5,000 Units Subcutaneous Q12H   insulin  aspart  0-5 Units Subcutaneous QHS   insulin  aspart  0-6 Units Subcutaneous TID WC   linezolid   600 mg Oral Q12H   metoprolol  succinate  25 mg Oral Daily   sodium chloride  flush  3 mL Intravenous Q12H   sodium chloride  flush  3 mL Intravenous Q12H   sodium zirconium cyclosilicate   10 g Oral Daily   Continuous Infusions:  ampicillin -sulbactam (UNASYN ) IV 3 g (06/23/24 1104)     LOS: 5 days    Time spent: 50 Mins    Darcel Dawley, MD [facility]   If 7PM-7AM, please contact night-coverage  "

## 2024-06-23 NOTE — Progress Notes (Signed)
 Physical Therapy Treatment Patient Details Name: Vincent Baxter. MRN: [id number] DOB: [birth year] Today's Date: 06/23/2024   History of Present Illness 89 yo M adm 06/18/24 with B LE swelling, L great toe wound x 1 week PMH: HTN, COPD, T2DM, renal mass, nephrolithiasis, AAA, CKD, anemia, GERD, THC and tobacco use, BPH, CHF with EF 25-30%.    PT Comments  Pt resting in bed on arrival, pleasant and agreeable to session however limited by LE pain and stiffness. Pt able to come to sitting EOB at mod I level and perform transfers sit<>stand without UE support with grossly CGA for safety. Pt declining further gait or exercises due to pain despite continued education on importance of mobility to maintain strength and prevent stiffness. Current plan remains appropriate to address deficits and maximize functional independence and decrease caregiver burden. Pt continues to benefit from skilled PT services to progress toward functional mobility goals.     If plan is discharge home, recommend the following: Assistance with cooking/housework;Assist for transportation   Can travel by private vehicle     Yes  Equipment Recommendations  Wheelchair (measurements PT);Wheelchair cushion (measurements PT);BSC/3in1    Recommendations for Other Services       Precautions / Restrictions Precautions Precautions: Fall Recall of Precautions/Restrictions: Intact Restrictions Weight Bearing Restrictions Per Provider Order: No     Mobility  Bed Mobility Overal bed mobility: Modified Independent             General bed mobility comments: increased time and use of bed features    Transfers Overall transfer level: Needs assistance Equipment used: None Transfers: Sit to/from Stand Sit to Stand: Contact guard assist           General transfer comment: CGA to come to stand at EOB, pt declining further gait due to pain and fatigue    Ambulation/Gait                   Stairs              Wheelchair Mobility     Tilt Bed    Modified Rankin (Stroke Patients Only)       Balance Overall balance assessment: Needs assistance Sitting-balance support: No upper extremity supported, Feet supported Sitting balance-Leahy Scale: Good     Standing balance support: No upper extremity supported Standing balance-Leahy Scale: Fair Standing balance comment: able to maintain static standing without UE support                            Communication Communication Communication: No apparent difficulties  Cognition Arousal: Alert Behavior During Therapy: WFL for tasks assessed/performed   PT - Cognitive impairments: Problem solving                         Following commands: Intact      Cueing Cueing Techniques: Verbal cues  Exercises      General Comments General comments (skin integrity, edema, etc.): Contineud education on the importance of mobility to help with stiffness/swelling in the bil LE, poor health literacy with difficulty understanding education despite multiple attempts at various ways of education      Pertinent Vitals/Pain Pain Assessment Pain Assessment: Faces Faces Pain Scale: Hurts little more Pain Location: LEs and back Pain Descriptors / Indicators: Aching Pain Intervention(s): Monitored during session, Limited activity within patient's tolerance    Home Living  Prior Function            PT Goals (current goals can now be found in the care plan section) Acute Rehab PT Goals Patient Stated Goal: to decrease pain and improve stiffness in bil LE PT Goal Formulation: With patient Time For Goal Achievement: 07/03/24 Progress towards PT goals: Progressing toward goals    Frequency    Min 2X/week      PT Plan      Co-evaluation              AM-PAC PT 6 Clicks Mobility   Outcome Measure  Help needed turning from your back to your side while in a flat bed  without using bedrails?: None Help needed moving from lying on your back to sitting on the side of a flat bed without using bedrails?: None Help needed moving to and from a bed to a chair (including a wheelchair)?: A Little Help needed standing up from a chair using your arms (e.g., wheelchair or bedside chair)?: A Little Help needed to walk in hospital room?: Total Help needed climbing 3-5 steps with a railing? : Total 6 Click Score: 16    End of Session   Activity Tolerance: Patient tolerated treatment well;Patient limited by pain Patient left: in bed;with call bell/phone within reach;Other (comment);with bed alarm set (seated up EOB) Nurse Communication: Mobility status PT Visit Diagnosis: Other abnormalities of gait and mobility (R26.89);Pain Pain - Right/Left: Left Pain - part of body: Ankle and joints of foot     Time: 8876-8855 PT Time Calculation (min) (ACUTE ONLY): 21 min  Charges:    $Therapeutic Activity: 8-22 mins PT General Charges $$ ACUTE PT VISIT: 1 Visit                     Adan Beal R. PTA Acute Rehabilitation Services Office: [phone]   Therisa CHRISTELLA Boor 06/23/2024, 12:53 PM

## 2024-06-23 NOTE — Plan of Care (Signed)
   Problem: Education: Goal: Ability to describe self-care measures that may prevent or decrease complications (Diabetes Survival Skills Education) will improve Outcome: Progressing   Problem: Coping: Goal: Ability to adjust to condition or change in health will improve Outcome: Progressing   Problem: Fluid Volume: Goal: Ability to maintain a balanced intake and output will improve Outcome: Progressing

## 2024-06-24 ENCOUNTER — Encounter (HOSPITAL_COMMUNITY): Payer: Self-pay | Admitting: Vascular Surgery

## 2024-06-24 DIAGNOSIS — R7989 Other specified abnormal findings of blood chemistry: Secondary | ICD-10-CM | POA: Diagnosis not present

## 2024-06-24 DIAGNOSIS — R600 Localized edema: Secondary | ICD-10-CM | POA: Diagnosis not present

## 2024-06-24 DIAGNOSIS — L97529 Non-pressure chronic ulcer of other part of left foot with unspecified severity: Secondary | ICD-10-CM | POA: Diagnosis not present

## 2024-06-24 LAB — BASIC METABOLIC PANEL WITH GFR
Anion gap: 9 (ref 5–15)
BUN: 41 mg/dL — ABNORMAL HIGH (ref 8–23)
CO2: 23 mmol/L (ref 22–32)
Calcium: 9 mg/dL (ref 8.9–10.3)
Chloride: 106 mmol/L (ref 98–111)
Creatinine, Ser: 2.11 mg/dL — ABNORMAL HIGH (ref 0.61–1.24)
GFR, Estimated: 30 mL/min — ABNORMAL LOW
Glucose, Bld: 190 mg/dL — ABNORMAL HIGH (ref 70–99)
Potassium: 5.4 mmol/L — ABNORMAL HIGH (ref 3.5–5.1)
Sodium: 137 mmol/L (ref 135–145)

## 2024-06-24 LAB — GLUCOSE, CAPILLARY
Glucose-Capillary: 182 mg/dL — ABNORMAL HIGH (ref 70–99)
Glucose-Capillary: 163 mg/dL — ABNORMAL HIGH (ref 70–99)
Glucose-Capillary: 144 mg/dL — ABNORMAL HIGH (ref 70–99)
Glucose-Capillary: 131 mg/dL — ABNORMAL HIGH (ref 70–99)

## 2024-06-24 MED ORDER — AMOXICILLIN-POT CLAVULANATE 500-125 MG PO TABS
1.0000 | ORAL_TABLET | Freq: Two times a day (BID) | ORAL | Status: DC
  Administered 2024-06-24 – 2024-06-29 (×10): 1 via ORAL
  Filled 2024-06-24 (×10): qty 1

## 2024-06-24 MED ORDER — SODIUM ZIRCONIUM CYCLOSILICATE 10 G PO PACK
10.0000 g | PACK | Freq: Two times a day (BID) | ORAL | Status: AC
  Administered 2024-06-24 (×2): 10 g via ORAL
  Filled 2024-06-24 (×2): qty 1

## 2024-06-24 NOTE — Hospital Course (Signed)
 89 year old male with past medical history significant for systolic CHF with EF of 25 to 69%, grade 2 diastolic dysfunction, COPD, type 2 diabetes mellitus, renal mass, nephrolithiasis, abdominal aortic aneurysm, chronic kidney disease stage IIIb, anemia, GERD, BPH, THC and tobacco abuse. Patient was admitted with left lower extremity peripheral artery disease, and left foot ulcer involving the plantar aspect of first left toe. Patient is status post revascularization of left lower extremity arteries. Orthopedic surgery is considering left first ray amputation. Patient was admitted for further evaluation

## 2024-06-24 NOTE — Progress Notes (Signed)
" ° °  Heart Failure Stewardship Pharmacist Progress Note   PCP: [facility] PCP-Cardiologist: None    HPI:  89 yo male with a PMH of HFrEF (EF 25-30%), COPD, T2DM, CKD3b, nephrolithiasis, AAA,  anemia, PVD and hx of THC and tobacco abuse was admitted on 06/18/24 for leg pain and swelling with ulcer.  Patient found to have a gangrenous diabetic foot ulcer on the plantar aspect of the IP joint of the L great toe now s/p extensive revascularization to the L lower extremity on 06/19/24. Ortho is currently recommending a  first ray amputation, but it appears that the patient is hoping that the wound will heal without surgical intervention.  Overall, patient is doing well today and is accompanied by his godson. He denies SOB, chest pain and difficulty lying flat. No lower extremity swelling observed on physical exam. Potassium was elevated again today at 5.4 after a dose of Lokelma  10 g. Scr is unchanged today at 2.11. Lokelma  10 g BID was ordered. Educated patient and his godson on metoprolol  succinate, which was started for him yesterday, and the importance of medication adherence. Patient stated he is anticipating going to a SNF at discharge.  Current HF Medications: Beta Blocker: metoprolol  succinate 25 mg   Prior to admission HF Medications: None  Pertinent Lab Values: Serum creatinine 2.11, BUN 41, Potassium 5.4 (Lokelma  x1 on 1/6), Sodium 137, proBNP 23,026 (06/18/24), Magnesium  1.9, A1c 6.8%  Vital Signs: Weight: 110 lbs (admission weight: 110 lbs) Blood pressure: 100s-120/70s-90s Heart rate: 70s-90s  I/O: net +0.2 L yesterday; net -2.3 L since admission  Medication Assistance / Insurance Benefits Check: Does the patient have prescription insurance?  Yes Type of insurance plan: [employer]/Medicare Part D  Outpatient Pharmacy:  Prior to admission [facility]  Is the patient willing to use [facility] TOC pharmacy at discharge? Yes Is the patient willing to transition  their outpatient pharmacy to utilize a [facility] outpatient pharmacy?   Yes    Assessment: 1. Acute chronic systolic CHF (LVEF 25-30%%). NYHA class I symptoms. - Currently on metoprolol  succinate 25 mg once daily - Currently in AKI -- Scr 1.59 > now up to 2.11, which is unchanged from yesterday - Potassium today was 5.4 after a dose of Lokelma  10 g x1 -Lokelma  10 g BID was initiated today   Plan: 1) Medication changes recommended at this time: - none  2) Patient assistance: - None anticipated as patient has [employer]  3)  Education  - To be completed prior to discharge  B. Maegan Glendia Olshefski, PharmD PGY-1 Pharmacy Resident [facility] 06/24/2024 9:37 AM    "

## 2024-06-24 NOTE — TOC Progression Note (Signed)
 Transition of Care (TOC) - Progression Note    Patient Details  Name: Vincent Baxter. MRN: [id number] Date of Birth: [birth year]  Transition of Care [facility]) CM/SW Contact  Montie LOISE Vicci, [place] Phone Number: 06/24/2024, 8:49 AM  Clinical Narrative:     Insurance authorization for [employer] still pending   Expected Discharge Plan: Skilled Nursing Facility Barriers to Discharge: Continued Medical Work up               Expected Discharge Plan and Services                                               Social Drivers of Health (SDOH) Interventions SDOH Screenings   Food Insecurity: No Food Insecurity (06/19/2024)  Housing: Unknown (06/23/2024)  Transportation Needs: No Transportation Needs (06/23/2024)  Utilities: Not At Risk (06/19/2024)  Alcohol Screen: Low Risk (06/23/2024)  Financial Resource Strain: Low Risk (06/23/2024)  Social Connections: Socially Isolated (06/19/2024)  Tobacco Use: High Risk (06/18/2024)    Readmission Risk Interventions     No data to display

## 2024-06-24 NOTE — Progress Notes (Signed)
 OT Cancellation Note  Patient Details Name: Vincent Baxter. MRN: [id number] DOB: February 09, 1935   Cancelled Treatment:    Reason Eval/Treat Not Completed: Other (comment) (Pt  adamantly declined OOB mobility.) will attempt a later date as schedule allows.  Alix Stowers,HILLARY 06/24/2024, 2:32 PM Kreg Sink, OT/L   Acute OT Clinical Specialist Acute Rehabilitation Services Pager [phone] Office [phone]

## 2024-06-24 NOTE — Progress Notes (Signed)
 Occupational Therapy Treatment Patient Details Name: Vincent Baxter. MRN: [id number] DOB: 1935-05-04 Today's Date: 06/24/2024   History of present illness 89 yo M adm 06/18/24 with B LE swelling, L great toe wound x 1 week Underwent fempop angioplasty and stenting 1/2. PMH: HTN, COPD, T2DM, renal mass, nephrolithiasis, AAA, CKD, anemia, GERD, THC and tobacco use, BPH, CHF with EF 25-30%.   OT comments  Pt agreeable to session. Pt progressing toward goals. Modified independent with bed mobility and CGA with mobility and ADL tasks @ RW level. Feel pt will benefit from continued inpatient follow up therapy, <3 hours/day to maximize functional level of independence with goal of returning home. Pt very appreciative after session. VSS on RA      If plan is discharge home, recommend the following:  A little help with walking and/or transfers;A little help with bathing/dressing/bathroom;Assistance with cooking/housework;Assist for transportation;Help with stairs or ramp for entrance   Equipment Recommendations  BSC/3in1;Tub/shower seat;Other (comment) (RW)    Recommendations for Other Services      Precautions / Restrictions Precautions Precautions: Fall Recall of Precautions/Restrictions: Intact Restrictions Weight Bearing Restrictions Per Provider Order: No       Mobility Bed Mobility Overal bed mobility: Modified Independent             General bed mobility comments: increased time and use of bed features    Transfers Overall transfer level: Needs assistance Equipment used: Rolling walker (2 wheels) Transfers: Sit to/from Stand Sit to Stand: Contact guard assist           General transfer comment: able to take several steps. 'I can go further but I'd just like to get back to bed if you don't mind     Balance Overall balance assessment: Needs assistance Sitting-balance support: No upper extremity supported, Feet supported Sitting balance-Leahy Scale: Good      Standing balance support: No upper extremity supported Standing balance-Leahy Scale: Fair Standing balance comment: able to maintain static standing without UE support                           ADL either performed or assessed with clinical judgement   ADL Overall ADL's : Needs assistance/impaired Eating/Feeding: Modified independent   Grooming: Set up;Sitting   Upper Body Bathing: Set up;Sitting   Lower Body Bathing: Contact guard assist;Sit to/from stand   Upper Body Dressing : Set up;Sitting   Lower Body Dressing: Contact guard assist;Sit to/from stand Lower Body Dressing Details (indicate cue type and reason): able to pull socks up at bed level usign figure four positioning             Functional mobility during ADLs: Contact guard assist;Rolling walker (2 wheels)      Extremity/Trunk Assessment Upper Extremity Assessment Upper Extremity Assessment: Generalized weakness            Vision       Perception     Praxis     Communication Communication Communication: No apparent difficulties   Cognition Arousal: Alert Behavior During Therapy: WFL for tasks assessed/performed Cognition: No family/caregiver present to determine baseline             OT - Cognition Comments: Decreased on-line awareness however able to redirect to task at hand; tangential at times                 Following commands: Intact        Cueing   Cueing  Techniques: Verbal cues  Exercises Exercises: Other exercises Other Exercises Other Exercises: states he has been doing leg exercises in bed    Shoulder Instructions       General Comments  Pt discussing how will work hard in rehab to get stronger and get home. States once he gets to rehab he will do everything they say to get better    Pertinent Vitals/ Pain       Pain Assessment Pain Assessment: Faces Faces Pain Scale: Hurts little more Pain Location: LEs and back Pain Descriptors / Indicators:  Aching Pain Intervention(s): Limited activity within patient's tolerance, Heat applied  Home Living                                          Prior Functioning/Environment              Frequency  Min 2X/week        Progress Toward Goals  OT Goals(current goals can now be found in the care plan section)  Progress towards OT goals: Progressing toward goals  Acute Rehab OT Goals OT Goal Formulation: With patient Time For Goal Achievement: 07/03/24 Potential to Achieve Goals: Good ADL Goals Pt Will Perform Grooming: with modified independence;standing Pt Will Perform Lower Body Bathing: with modified independence;sit to/from stand Pt Will Perform Lower Body Dressing: with modified independence;sit to/from stand Pt Will Transfer to Toilet: with modified independence;ambulating Pt Will Perform Toileting - Clothing Manipulation and hygiene: with modified independence;sit to/from stand Pt Will Perform Tub/Shower Transfer: Tub transfer;with modified independence;rolling walker;shower seat  Plan      Co-evaluation                 AM-PAC OT 6 Clicks Daily Activity     Outcome Measure   Help from another person eating meals?: A Little Help from another person taking care of personal grooming?: A Little Help from another person toileting, which includes using toliet, bedpan, or urinal?: A Little Help from another person bathing (including washing, rinsing, drying)?: A Little Help from another person to put on and taking off regular upper body clothing?: A Little Help from another person to put on and taking off regular lower body clothing?: A Little 6 Click Score: 18    End of Session Equipment Utilized During Treatment: Rolling walker (2 wheels)  OT Visit Diagnosis: Pain;Muscle weakness (generalized) (M62.81);Other symptoms and signs involving cognitive function Pain - Right/Left:  (B) Pain - part of body: Ankle and joints of foot   Activity  Tolerance Patient tolerated treatment well   Patient Left in bed;with call bell/phone within reach;with bed alarm set   Nurse Communication Mobility status        Time: 8385-8370 OT Time Calculation (min): 15 min  Charges: OT General Charges $OT Visit: 1 Visit OT Treatments $Self Care/Home Management : 8-22 mins  Kreg Sink, OT/L   Acute OT Clinical Specialist Acute Rehabilitation Services Pager [phone] Office [phone]   [facility] 06/24/2024, 4:38 PM

## 2024-06-24 NOTE — TOC Progression Note (Addendum)
 Transition of Care (TOC) - Progression Note    Patient Details  Name: Vincent Baxter. MRN: [id number] Date of Birth: 02-16-1935  Transition of Care [facility] CM/SW Contact  Montie LOISE Vicci, [place] Phone Number: 06/24/2024, 11:29 AM  Clinical Narrative:     Insurance authorization approved  for [facility] # [id number] reference # [id number] 01/7-01/9   Per MD patient not stable for d/c today- updated Karrin Montie Vicci, MSW, LCSW Clinical Social Worker    Expected Discharge Plan: Skilled Nursing Facility Barriers to Discharge: Continued Medical Work up               Expected Discharge Plan and Services                                               Social Drivers of Health (SDOH) Interventions SDOH Screenings   Food Insecurity: No Food Insecurity (06/19/2024)  Housing: Unknown (06/23/2024)  Transportation Needs: No Transportation Needs (06/23/2024)  Utilities: Not At Risk (06/19/2024)  Alcohol Screen: Low Risk (06/23/2024)  Financial Resource Strain: Low Risk (06/23/2024)  Social Connections: Socially Isolated (06/19/2024)  Tobacco Use: High Risk (06/18/2024)    Readmission Risk Interventions     No data to display

## 2024-06-24 NOTE — Progress Notes (Signed)
" °  Progress Note   Patient: Vincent Baxter. FMW:[id number] DOB: 1934/12/13 DOA: 06/18/2024     6 DOS: the patient was seen and examined on 06/24/2024   Brief hospital course: 89 year old male with past medical history significant for systolic CHF with EF of 25 to 69%, grade 2 diastolic dysfunction, COPD, type 2 diabetes mellitus, renal mass, nephrolithiasis, abdominal aortic aneurysm, chronic kidney disease stage IIIb, anemia, GERD, BPH, THC and tobacco abuse. Patient was admitted with left lower extremity peripheral artery disease, and left foot ulcer involving the plantar aspect of first left toe. Patient is status post revascularization of left lower extremity arteries. Orthopedic surgery is considering left first ray amputation. Patient was admitted for further evaluation   Assessment and Plan: CKD (chronic kidney disease) stage 3b: Renal function is relatively stable.Serum creatinine  remains stable at 2.11 today. Patient is at risk for contrast associated nephropathy. Continue to hold furosemide . Avoid nephrotoxins whenever possible. Dose all medications, considering impaired renal function. Monitor renal function and electrolytes daily.   Hyperkalemia: Potassium of 5.4 Ordered lokelma  10g x 2 doses Repeat renal panel in the morning.    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 will continue home medications   Ischemic/diabetic foot ulcer (HCC): Plain Images showed no evidence of osteomyelitis. Patient underwent vascular intervention Continue aspirin  Plavix  and a statin.  Patient is optimized from vascular standpoint. Orthopedic surgery team was consulted.   Now on augmenting Discussed with Orthopedic Surgery. No plan for surgery as inpt. OK to have pt f/u with Dr. Harden in 1 week post-discharge   DM2 (diabetes mellitus, type 2) (HCC) Continue Sliding scale insulin  as per protocol.   Elevated troponin Chronic stable.  Patient denies  any chest pain    Essential hypertension: Continue to monitor closely.   Acute on chronic systolic CHF (congestive heart failure) (HCC) Seems compensated. Will continue to hold Lasix .  Patient has been exposed to contrast dye.   Tobacco abuse  Counseled to quit.       Subjective: Initially very eager to have surgery for foot, however later agreeable to holding off on surgery and focus on rehab as per Orthopedic Surgeon recs  Physical Exam: Vitals:   06/23/24 2315 06/24/24 0331 06/24/24 0542 06/24/24 0911  BP: 117/80 (!) 103/90  111/81  Pulse: 85 80  76  Resp: 20 20  15   Temp: 98.2 F (36.8 C) 97.6 F (36.4 C)  (!) 97.2 F (36.2 C)  TempSrc: Oral Oral  Oral  SpO2: 99% 100%  94%  Weight:   50 kg   Height:       General exam: Awake, laying in bed, in nad Respiratory system: Normal respiratory effort, no wheezing Cardiovascular system: regular rate, s1, s2 Gastrointestinal system: Soft, nondistended, positive BS Central nervous system: CN2-12 grossly intact, strength intact Extremities: No clubbing Skin: Normal skin turgor, no notable skin lesions seen Psychiatry: Mood normal // no visual hallucinations   Data Reviewed:  Labs reviewed: Na 137, K 5.4, Cr 2.11  Family Communication: Pt in room, family not at bedside  Disposition: Status is: Inpatient Remains inpatient appropriate because: severity of illness  Planned Discharge Destination: Skilled nursing facility    Author: Garnette Pelt, MD 06/24/2024 4:57 PM  For on call review [email or web address].  "

## 2024-06-25 DIAGNOSIS — R7989 Other specified abnormal findings of blood chemistry: Secondary | ICD-10-CM | POA: Diagnosis not present

## 2024-06-25 DIAGNOSIS — L97529 Non-pressure chronic ulcer of other part of left foot with unspecified severity: Secondary | ICD-10-CM | POA: Diagnosis not present

## 2024-06-25 LAB — GLUCOSE, CAPILLARY
Glucose-Capillary: 132 mg/dL — ABNORMAL HIGH (ref 70–99)
Glucose-Capillary: 141 mg/dL — ABNORMAL HIGH (ref 70–99)
Glucose-Capillary: 203 mg/dL — ABNORMAL HIGH (ref 70–99)
Glucose-Capillary: 167 mg/dL — ABNORMAL HIGH (ref 70–99)

## 2024-06-25 LAB — CULTURE, BLOOD (ROUTINE X 2)
Culture: NO GROWTH
Culture: NO GROWTH
Special Requests: ADEQUATE
Special Requests: ADEQUATE

## 2024-06-25 LAB — COMPREHENSIVE METABOLIC PANEL WITH GFR
ALT: 16 U/L (ref 0–44)
AST: 18 U/L (ref 15–41)
Albumin: 3.2 g/dL — ABNORMAL LOW (ref 3.5–5.0)
Alkaline Phosphatase: 95 U/L (ref 38–126)
Anion gap: 10 (ref 5–15)
BUN: 40 mg/dL — ABNORMAL HIGH (ref 8–23)
CO2: 22 mmol/L (ref 22–32)
Calcium: 8.7 mg/dL — ABNORMAL LOW (ref 8.9–10.3)
Chloride: 104 mmol/L (ref 98–111)
Creatinine, Ser: 2.25 mg/dL — ABNORMAL HIGH (ref 0.61–1.24)
GFR, Estimated: 28 mL/min — ABNORMAL LOW
Glucose, Bld: 146 mg/dL — ABNORMAL HIGH (ref 70–99)
Potassium: 5 mmol/L (ref 3.5–5.1)
Sodium: 136 mmol/L (ref 135–145)
Total Bilirubin: 0.4 mg/dL (ref 0.0–1.2)
Total Protein: 5.4 g/dL — ABNORMAL LOW (ref 6.5–8.1)

## 2024-06-25 MED ORDER — ADULT MULTIVITAMIN W/MINERALS CH
1.0000 | ORAL_TABLET | Freq: Every day | ORAL | Status: DC
  Administered 2024-06-25 – 2024-06-28 (×4): 1 via ORAL
  Filled 2024-06-25 (×4): qty 1

## 2024-06-25 MED ORDER — SODIUM ZIRCONIUM CYCLOSILICATE 10 G PO PACK
10.0000 g | PACK | Freq: Two times a day (BID) | ORAL | Status: AC
  Administered 2024-06-25 (×2): 10 g via ORAL
  Filled 2024-06-25 (×2): qty 1

## 2024-06-25 NOTE — Progress Notes (Incomplete)
" ° °  Heart Failure Stewardship Pharmacist Progress Note   PCP: [facility] PCP-Cardiologist: None    HPI:  89 yo male with a PMH of HFrEF (EF 25-30%), COPD, T2DM, CKD3b, nephrolithiasis, AAA,  anemia, PVD and hx of THC and tobacco abuse was admitted on 06/18/24 for leg pain and swelling with ulcer.  Patient found to have a gangrenous diabetic foot ulcer on the plantar aspect of the IP joint of the L great toe now s/p extensive revascularization to the L lower extremity on 06/19/24. Ortho is currently recommending a  first ray amputation, but it appears that the patient is hoping that the wound will heal without surgical intervention.  Overall, patient is doing well today. He denies SOB, chest pain and difficulty lying flat. No lower extremity swelling observed on physical exam. He did endorse some mucus production today with cough. I asked the RN to give a dose of as needed Robitussin DM to see if that would help his symptoms. Potassium was elevated again today at 5 after 2 doses of Lokelma  10 g. Scr is elevated today at 2.25. Patient also had a decrease in his BP to 87/58 this morning. Patient denied lightheadedness or dizziness. Re-emphasized the importance of medication adherence. Patient stated he is anticipating going to a SNF at discharge with plan to follow-up with Dr. Harden 1-week post-discharge.   Current HF Medications: Beta Blocker: metoprolol  succinate 25 mg   Prior to admission HF Medications: None  Pertinent Lab Values: Serum creatinine 2.25, BUN 40, Potassium 5.0 (s/p Lokelma  10 g x2), Sodium 136, proBNP 23,026 (06/18/24), Magnesium  1.9, A1c 6.8%  Vital Signs: Weight: 110 lbs (admission weight: 110 lbs) Blood pressure: 90s-110s/70s-80s Heart rate: 60s-90 I/O: net - 0.2 L yesterday; net -3 L since admission  Medication Assistance / Insurance Benefits Check: Does the patient have prescription insurance?  Yes Type of insurance plan: [employer]/Medicare Part D  Outpatient  Pharmacy:  Prior to admission outpatient pharmacy[facility]  Is the patient willing to use [facility] TOC pharmacy at discharge? Yes Is the patient willing to transition their outpatient pharmacy to utilize a [facility] outpatient pharmacy?   Yes    Assessment: 1. Acute chronic systolic CHF (LVEF 25-30%%). NYHA class I symptoms. - Remains euvolemic - Currently on metoprolol  succinate 25 mg once daily - Currently in AKI -- Scr 1.59 > now up to 2.25 - Potassium today was 5.0 from 5.4 after Lokelma  10g x2  Plan: 1) Medication changes recommended at this time: - Initiate Lokelma  10 g BID x2 doses  2) Patient assistance: - None anticipated as patient has [employer]  3)  Education  - To be completed prior to discharge  B. Maegan Demon Volante, PharmD PGY-1 Pharmacy Resident [facility] 06/25/2024 8:04 AM    "

## 2024-06-25 NOTE — Progress Notes (Signed)
 Nutrition Follow-up  DOCUMENTATION CODES:  Not applicable  INTERVENTION:  Continue regular diet as ordered Ensure Plus High Protein po BID, each supplement provides 350 kcal and 20 grams of protein. Magic cup TID with meals, each supplement provides 290 kcal and 9 grams of protein MVI with minerals daily  NUTRITION DIAGNOSIS:  Increased nutrient needs related to wound healing as evidenced by estimated needs. - remains applicable  GOAL:  Patient will meet greater than or equal to 90% of their needs - addressing via meals and nutrition supplements  MONITOR:   PO intake, Supplement acceptance, Labs, Weight trends, Skin  REASON FOR ASSESSMENT:   Consult Assessment of nutrition requirement/status, Wound healing  ASSESSMENT:   89 y.o male with PMH significant for systolic CHF with EF of 25 to 69%, COPD, DM2, renal mass, nephrolithiasis, abdominal aortic aneurysm, CKD IIIb, anemia, GERD, BPH, THC and tobacco abuse admitted with left lower extremity PAD and left foot ulcer involving the plantar aspect of first left toe  1/1 Admit 1/2 s/p left lower extremity angiogram with fempop angioplasty and stenting, and angioplasty of the posterior tibial artery and tibioperoneal trunk   No plan for surgical intervention during in patient admission.  Dispo: discharge to [facility] for rehab prior to amputation.   Patient sleeping soundly at time of visit. Ensure at bedside at time of visit but felt to be mostly full. Spoke with RN who reports that pt is eating well. He enjoys juice and is consuming/likes the Ensure. Will continue with current nutrition interventions to optimize nutritional status to support healing.   No documented meals on file to review.   First measured weight (1/3): 55.9 kg Current weight: 50 kg  Medications: SSI 0-5 units at bedtime, SSI 0-6 units TID  Labs:  BUN  40 Cr 2.25 GFR 28 CBG's 131-163 x24 hours  Diet Order:   Diet Order             Diet regular  Room service appropriate? Yes; Fluid consistency: Thin  Diet effective now                   EDUCATION NEEDS:   Education needs have been addressed  Skin:  Skin Assessment: Reviewed RN Assessment  Last BM:  1/6 type 2 small  Height:   Ht Readings from Last 1 Encounters:  06/18/24 5' 7 (1.702 m)    Weight:   Wt Readings from Last 1 Encounters:  06/24/24 50 kg   BMI:  Body mass index is 17.26 kg/m.  Estimated Nutritional Needs:   Kcal:  1700-1850 kcals  Protein:  75-85 grams  Fluid:  >/= 1.7L  Royce Maris, RDN, LDN Clinical Nutrition See AMiON for contact information.

## 2024-06-25 NOTE — Progress Notes (Signed)
 Physical Therapy Treatment Patient Details Name: Vincent Baxter. MRN: [id number] DOB: 07/09/1934 Today's Date: 06/25/2024   History of Present Illness 89 yo M adm 06/18/24 with B LE swelling, L great toe wound x 1 week PMH: HTN, COPD, T2DM, renal mass, nephrolithiasis, AAA, CKD, anemia, GERD, THC and tobacco use, BPH, CHF with EF 25-30%.    PT Comments  The pt initially was reluctant to mobilize. The pt asked why his ankles were stiff. When trying to educate the pt on importance of OOB mobility to reduce joint stiffness and muscular atrophy, the pt was initially not receptive to education despite repeatedly asking questions. Instead he would often speak over the therapist. Eventually pt was agreeable to OOB mobility and became receptive to some education, but he tends to like to do things his way. He prefers to do exercises and stretches in bed, and does not see the benefit of OOB mobility and weight resistive exercises. He only agreed to walk the distance of the room and back. When trying to encourage him to walk further he became upset, left the RW and ambulated back to the bed without UE support. He did not have any LOB and only needed CGA for safety. He seems capable of doing much more than what he is currently willing to attempt. Due to his lack of frequent support at d/c and his lack of activity tolerance, he still could benefit from short-term rehab, < 3 hours/day. Will continue to follow acutely.     If plan is discharge home, recommend the following: Assistance with cooking/housework;Assist for transportation;A little help with bathing/dressing/bathroom   Can travel by private vehicle     Yes  Equipment Recommendations  BSC/3in1;Rollator (4 wheels)    Recommendations for Other Services       Precautions / Restrictions Precautions Precautions: Fall Recall of Precautions/Restrictions: Intact Restrictions Weight Bearing Restrictions Per Provider Order: No     Mobility  Bed  Mobility Overal bed mobility: Modified Independent             General bed mobility comments: increased time and use of bed features    Transfers Overall transfer level: Needs assistance Equipment used: Rolling walker (2 wheels) Transfers: Sit to/from Stand Sit to Stand: Contact guard assist           General transfer comment: CGA to come to stand at EOB, no LOB    Ambulation/Gait Ambulation/Gait assistance: Contact guard assist Gait Distance (Feet): 30 Feet Assistive device: Rolling walker (2 wheels), None Gait Pattern/deviations: Step-through pattern Gait velocity: reduced Gait velocity interpretation: <1.31 ft/sec, indicative of household ambulator   General Gait Details: Pt ambulated very quickly and impulsively to the door in his room with the RW, pushing it distally to him. Pt not receptive to cues to keep it proximal or slow down for safety. When trying to encourage pt to ambulate further in the hall he became upset and pushed the RW away and walked back to his bed without UE support. Still no LOB, CGA for safety   Stairs             Wheelchair Mobility     Tilt Bed    Modified Rankin (Stroke Patients Only)       Balance Overall balance assessment: Needs assistance Sitting-balance support: No upper extremity supported, Feet supported Sitting balance-Leahy Scale: Good     Standing balance support: No upper extremity supported, Bilateral upper extremity supported, During functional activity Standing balance-Leahy Scale: Fair Standing balance comment:  Able to ambulate without UE support with CGA                            Communication Communication Communication: No apparent difficulties  Cognition Arousal: Alert Behavior During Therapy: WFL for tasks assessed/performed, Impulsive   PT - Cognitive impairments: Problem solving, Awareness, Attention                       PT - Cognition Comments: This could likely be his  baseline, but he tends to need cues to let therapist speak rather than speak over therapist and he changes topics often. He has poor medical understanding of his body and how to address his stiffness and strength deficits, desiring a machine do it for him rather than attempt standing exercises. Pt initially reluctant to mobilize then eventually became agreeable, but was adamant against walking down to the nurse's station. He is a bit set in his ways. Can be impulsive with gait, moving too quickly with RW for therapist to guard him efficiently, and pt not receptive to cues to slow down or allow therapist to guard him Following commands: Impaired (limited by his desire to follow cues at times rather than incapability to do so) Following commands impaired: Follows multi-step commands with increased time, Follows multi-step commands inconsistently    Cueing Cueing Techniques: Verbal cues, Tactile cues  Exercises Other Exercises Other Exercises: SAQ R leg supine, >5 reps, AROM Other Exercises: supine bil knee hug stretch, x3 reps >20 sec each Other Exercises: supine hamstring PROM stretch 1x ~30 sec each Other Exercises: supine ankle dorisflexion stretch with gait belt loop around forefoot, 1x ~20 sec each Other Exercises: Supine hip adductor stretch, 1x ~30 sec    General Comments General comments (skin integrity, edema, etc.): Extensive education provided on importance of mobility to improve strength and reduce stiffness and the importance of OOB weight bearing activities to challenge his muscles and joints further to progress him back to going home. Pt not receptive of most education, only really focusing on what is and is not muscle or bone in his legs and how to stretch his legs while supine.      Pertinent Vitals/Pain Pain Assessment Pain Assessment: Faces Faces Pain Scale: Hurts little more Pain Location: LEs and back Pain Descriptors / Indicators: Other (Comment) (stiff) Pain  Intervention(s): Limited activity within patient's tolerance, Monitored during session, Repositioned, Premedicated before session    Home Living                          Prior Function            PT Goals (current goals can now be found in the care plan section) Acute Rehab PT Goals Patient Stated Goal: to decrease pain and improve stiffness in bil LE PT Goal Formulation: With patient/family Time For Goal Achievement: 07/03/24 Potential to Achieve Goals: Good Progress towards PT goals: Progressing toward goals    Frequency    Min 2X/week      PT Plan      Co-evaluation              AM-PAC PT 6 Clicks Mobility   Outcome Measure  Help needed turning from your back to your side while in a flat bed without using bedrails?: None Help needed moving from lying on your back to sitting on the side of a flat bed without using  bedrails?: None Help needed moving to and from a bed to a chair (including a wheelchair)?: A Little Help needed standing up from a chair using your arms (e.g., wheelchair or bedside chair)?: A Little Help needed to walk in hospital room?: A Little Help needed climbing 3-5 steps with a railing? : Total 6 Click Score: 18    End of Session   Activity Tolerance: Patient tolerated treatment well Patient left: in bed;with call bell/phone within reach;with family/visitor present (bed alarm would not set) Nurse Communication: Mobility status (bed alarm wound not set despite repeated attempts) PT Visit Diagnosis: Other abnormalities of gait and mobility (R26.89);Pain;Difficulty in walking, not elsewhere classified (R26.2) Pain - part of body: Ankle and joints of foot (back)     Time: 1550-1620 PT Time Calculation (min) (ACUTE ONLY): 30 min  Charges:    $Therapeutic Exercise: 8-22 mins $Therapeutic Activity: 8-22 mins PT General Charges $$ ACUTE PT VISIT: 1 Visit                     Theo Ferretti, PT, DPT Acute Rehabilitation  Services  Office: [phone]    Theo CHRISTELLA Ferretti 06/25/2024, 4:48 PM

## 2024-06-25 NOTE — Progress Notes (Signed)
" °  Progress Note   Patient: Vincent Baxter. FMW:[id number] DOB: 07-Mar-1935 DOA: 06/18/2024     7 DOS: the patient was seen and examined on 06/25/2024   Brief hospital course: 89 year old male with past medical history significant for systolic CHF with EF of 25 to 69%, grade 2 diastolic dysfunction, COPD, type 2 diabetes mellitus, renal mass, nephrolithiasis, abdominal aortic aneurysm, chronic kidney disease stage IIIb, anemia, GERD, BPH, THC and tobacco abuse. Patient was admitted with left lower extremity peripheral artery disease, and left foot ulcer involving the plantar aspect of first left toe. Patient is status post revascularization of left lower extremity arteries. Orthopedic surgery is considering left first ray amputation. Patient was admitted for further evaluation   Assessment and Plan: CKD (chronic kidney disease) stage 3b: Renal function is relatively stable.Serum creatinine  remains stable at 2.11 today. Patient is at risk for contrast associated nephropathy. Continue to hold furosemide . Avoid nephrotoxins whenever possible. Dose all medications, considering impaired renal function. Monitor renal function and electrolytes daily.   Hyperkalemia: Potassium remains at 5 Ordered lokelma  10g x 2 doses this AM Repeat renal panel in the morning.    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 will continue home medications   Ischemic/diabetic foot ulcer (HCC): Plain Images showed no evidence of osteomyelitis. Patient underwent vascular intervention Continue aspirin  Plavix  and a statin.  Patient is optimized from vascular standpoint. Orthopedic surgery team was consulted.   Now on augmenting Discussed with Orthopedic Surgery. No plan for surgery as inpt. OK to have pt f/u with Dr. Harden in 1 week post-discharge   DM2 (diabetes mellitus, type 2) (HCC) Continue Sliding scale insulin  as per protocol.   Elevated troponin Chronic stable.   Patient denies any chest pain    Essential hypertension: Continue to monitor closely.   Acute on chronic systolic CHF (congestive heart failure) (HCC) Seems compensated. Will continue to hold Lasix .  Patient has been exposed to contrast dye.   Tobacco abuse  Counseled to quit.       Subjective: Eager to go to rehab  Physical Exam: Vitals:   06/24/24 2309 06/25/24 0305 06/25/24 0817 06/25/24 1147  BP: 106/74 107/88 (!) 87/58 108/76  Pulse: 71 81 71 64  Resp: 20 20 18 18   Temp: 98.2 F (36.8 C) 98.3 F (36.8 C) (!) 97.5 F (36.4 C) 97.6 F (36.4 C)  TempSrc: Oral Oral Oral Axillary  SpO2: 100% 99% 98% 100%  Weight:      Height:       General exam: Conversant, in no acute distress Respiratory system: normal chest rise, clear, no audible wheezing Cardiovascular system: regular rhythm, s1-s2 Gastrointestinal system: Nondistended, nontender, pos BS Central nervous system: No seizures, no tremors Extremities: No cyanosis, no joint deformities Skin: No rashes, no pallor Psychiatry: Affect normal // no auditory hallucinations   Data Reviewed:  Labs reviewed: Na 136, K 5.0, BUN 40, Cr 2.25  Family Communication: Pt in room, family not at bedside  Disposition: Status is: Inpatient Remains inpatient appropriate because: severity of illness  Planned Discharge Destination: Skilled nursing facility    Author: Garnette Pelt, MD 06/25/2024 6:28 PM  For on call review [email or web address]  "

## 2024-06-25 NOTE — TOC Progression Note (Signed)
 Transition of Care (TOC) - Progression Note    Patient Details  Name: Vincent Baxter. MRN: [id number] Date of Birth: Jul 01, 1934  Transition of Care [facility]) CM/SW Contact  Montie LOISE Vicci, [place] Phone Number: 06/25/2024, 4:05 PM  Clinical Narrative:     Insurance authorization still pending   Montie Vicci, MSW, LCSW Clinical Social Worker    Expected Discharge Plan: Skilled Nursing Facility Barriers to Discharge: Continued Medical Work up               Expected Discharge Plan and Services                                               Social Drivers of Health (SDOH) Interventions SDOH Screenings   Food Insecurity: No Food Insecurity (06/19/2024)  Housing: Unknown (06/23/2024)  Transportation Needs: No Transportation Needs (06/23/2024)  Utilities: Not At Risk (06/19/2024)  Alcohol Screen: Low Risk (06/23/2024)  Financial Resource Strain: Low Risk (06/23/2024)  Social Connections: Socially Isolated (06/19/2024)  Tobacco Use: High Risk (06/18/2024)    Readmission Risk Interventions     No data to display

## 2024-06-25 NOTE — TOC Progression Note (Signed)
 Transition of Care (TOC) - Progression Note    Patient Details  Name: Vincent Baxter. MRN: [id number] Date of Birth: 03/09/1935  Transition of Care [facility]) CM/SW Contact  Montie LOISE Vicci, [place] Phone Number: 06/25/2024, 4:43 PM  Clinical Narrative:     Received call back from insurance : P2P was requested by 2pm today, MD was notified - P2P not completed  Per [facility] - per medial director reviewing clinicals, it appears patient needs more acute care. Rehab at SNF was denied.  Patient can appeal call to appeal  [phone] or fax [phone].  TOC will continue to follow and assist with discharge planning.   Montie Vicci, MSW, LCSW Clinical Social Worker    Expected Discharge Plan: Skilled Nursing Facility Barriers to Discharge: Continued Medical Work up               Expected Discharge Plan and Services                                               Social Drivers of Health (SDOH) Interventions SDOH Screenings   Food Insecurity: No Food Insecurity (06/19/2024)  Housing: Unknown (06/23/2024)  Transportation Needs: No Transportation Needs (06/23/2024)  Utilities: Not At Risk (06/19/2024)  Alcohol Screen: Low Risk (06/23/2024)  Financial Resource Strain: Low Risk (06/23/2024)  Social Connections: Socially Isolated (06/19/2024)  Tobacco Use: High Risk (06/18/2024)    Readmission Risk Interventions     No data to display

## 2024-06-26 ENCOUNTER — Inpatient Hospital Stay (HOSPITAL_COMMUNITY)

## 2024-06-26 DIAGNOSIS — R7989 Other specified abnormal findings of blood chemistry: Secondary | ICD-10-CM | POA: Diagnosis not present

## 2024-06-26 DIAGNOSIS — L97529 Non-pressure chronic ulcer of other part of left foot with unspecified severity: Secondary | ICD-10-CM | POA: Diagnosis not present

## 2024-06-26 LAB — GLUCOSE, CAPILLARY
Glucose-Capillary: 144 mg/dL — ABNORMAL HIGH (ref 70–99)
Glucose-Capillary: 164 mg/dL — ABNORMAL HIGH (ref 70–99)
Glucose-Capillary: 215 mg/dL — ABNORMAL HIGH (ref 70–99)
Glucose-Capillary: 119 mg/dL — ABNORMAL HIGH (ref 70–99)

## 2024-06-26 LAB — CBC
HCT: 30.9 % — ABNORMAL LOW (ref 39.0–52.0)
Hemoglobin: 10.7 g/dL — ABNORMAL LOW (ref 13.0–17.0)
MCH: 33.2 pg (ref 26.0–34.0)
MCHC: 34.6 g/dL (ref 30.0–36.0)
MCV: 96 fL (ref 80.0–100.0)
Platelets: 145 K/uL — ABNORMAL LOW (ref 150–400)
RBC: 3.22 MIL/uL — ABNORMAL LOW (ref 4.22–5.81)
RDW: 13.7 % (ref 11.5–15.5)
WBC: 4.7 K/uL (ref 4.0–10.5)
nRBC: 0 % (ref 0.0–0.2)

## 2024-06-26 LAB — COMPREHENSIVE METABOLIC PANEL WITH GFR
ALT: 19 U/L (ref 0–44)
AST: 21 U/L (ref 15–41)
Albumin: 3.4 g/dL — ABNORMAL LOW (ref 3.5–5.0)
Alkaline Phosphatase: 96 U/L (ref 38–126)
Anion gap: 12 (ref 5–15)
BUN: 42 mg/dL — ABNORMAL HIGH (ref 8–23)
CO2: 21 mmol/L — ABNORMAL LOW (ref 22–32)
Calcium: 8.6 mg/dL — ABNORMAL LOW (ref 8.9–10.3)
Chloride: 105 mmol/L (ref 98–111)
Creatinine, Ser: 2.32 mg/dL — ABNORMAL HIGH (ref 0.61–1.24)
GFR, Estimated: 27 mL/min — ABNORMAL LOW
Glucose, Bld: 136 mg/dL — ABNORMAL HIGH (ref 70–99)
Potassium: 4.6 mmol/L (ref 3.5–5.1)
Sodium: 138 mmol/L (ref 135–145)
Total Bilirubin: 0.5 mg/dL (ref 0.0–1.2)
Total Protein: 5.7 g/dL — ABNORMAL LOW (ref 6.5–8.1)

## 2024-06-26 MED ORDER — LACTATED RINGERS IV SOLN: INTRAVENOUS | Status: AC

## 2024-06-26 NOTE — Progress Notes (Signed)
" ° °  Heart Failure Stewardship Pharmacist Progress Note   PCP: [facility] PCP-Cardiologist: None    HPI:  89 yo male with a PMH of HFrEF (EF 25-30%), COPD, T2DM, CKD3b, nephrolithiasis, AAA,  anemia, PVD and hx of THC and tobacco abuse was admitted on 06/18/24 for leg pain and swelling with ulcer.  Patient found to have a gangrenous diabetic foot ulcer on the plantar aspect of the IP joint of the L great toe now s/p extensive revascularization to the L lower extremity on 06/19/24. Ortho is currently recommending a  first ray amputation, but it appears that the patient is hoping that the wound will heal without surgical intervention.  He denies SOB, chest pain and difficulty lying flat. No lower extremity swelling observed on physical exam. Labs pending. Patient denied lightheadedness or dizziness. Educated on the importance of medication adherence. Patient stated he is anticipating going to a SNF at discharge with plan to follow-up with Dr. Harden 1-week post-discharge.   Current HF Medications: Beta Blocker: metoprolol  succinate 25 mg   Prior to admission HF Medications: None  Pertinent Lab Values: 1/8: Serum creatinine 2.25, BUN 40, Potassium 5.0 (s/p Lokelma  10 g x2), Sodium 136, proBNP 23,026 (06/18/24), Magnesium  1.9, A1c 6.8%  Vital Signs: Weight: 112 lbs (admission weight: 110 lbs) Blood pressure: 100/70s Heart rate: 60s I/O: incomplete yesterday; net -3 L since admission  Medication Assistance / Insurance Benefits Check: Does the patient have prescription insurance?  Yes Type of insurance plan: [employer]/Medicare Part D  Outpatient Pharmacy:  Prior to admission outpatient [facility]  Is the patient willing to use [facility] TOC pharmacy at discharge? Yes Is the patient willing to transition their outpatient pharmacy to utilize a [facility] outpatient pharmacy?   Yes    Assessment: 1. Acute chronic systolic CHF (LVEF 25-30%%). NYHA class I symptoms. - Remains  euvolemic - Currently on metoprolol  succinate 25 mg once daily - follow up BMET for trends in SCr and potassium to determine additional need for lokelma  (received 2 doses yesterday)  Plan: 1) Medication changes recommended at this time: - Follow up BMET for trends in SCr and potassium to determine additional need for lokelma   2) Patient assistance: - None anticipated as patient has [employer]  3)  Education  - Patient has been educated on current HF medications and potential additions to HF medication regimen - Patient verbalizes understanding that over the next few months, these medication doses may change and more medications may be added to optimize HF regimen - Patient has been educated on basic disease state pathophysiology and goals of therapy   Duwaine Plant, PharmD, BCPS Heart Failure Stewardship Pharmacist Phone [phone]    "

## 2024-06-26 NOTE — Progress Notes (Signed)
" °  Progress Note   Patient: Vincent Baxter. FMW:[id number] DOB: 1934-08-23 DOA: 06/18/2024     8 DOS: the patient was seen and examined on 06/26/2024   Brief hospital course: 89 year old male with past medical history significant for systolic CHF with EF of 25 to 69%, grade 2 diastolic dysfunction, COPD, type 2 diabetes mellitus, renal mass, nephrolithiasis, abdominal aortic aneurysm, chronic kidney disease stage IIIb, anemia, GERD, BPH, THC and tobacco abuse. Patient was admitted with left lower extremity peripheral artery disease, and left foot ulcer involving the plantar aspect of first left toe. Patient is status post revascularization of left lower extremity arteries. Orthopedic surgery is considering left first ray amputation. Patient was admitted for further evaluation   Assessment and Plan: CKD (chronic kidney disease) stage 3b: Renal function is relatively stable.Serum creatinine  remains stable at 2.11 today. Patient is at risk for contrast associated nephropathy. Continue to hold furosemide . Avoid nephrotoxins whenever possible. Dose all medications, considering impaired renal function. Monitor renal function and electrolytes daily, Cr overall stable, most recently 2.32. Will provide gentle hydration and repeat in AM   Hyperkalemia: Potassium improved to 4.6 after lokelma  Repeat renal panel in the morning.    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 will continue home medications   Ischemic/diabetic foot ulcer (HCC): Plain Images showed no evidence of osteomyelitis. Patient underwent vascular intervention Continue aspirin  Plavix  and a statin.  Patient is optimized from vascular standpoint. Orthopedic surgery team was consulted.   Now on augmentin  Discussed with Orthopedic Surgery. No plan for surgery as inpt. OK to have pt f/u with Dr. Harden in 1 week post-discharge Pt complains of pain in R groin x 1 day. No significant palpable mass  appreciated on exam Hgb is stable Will check CT abd/pelvis   DM2 (diabetes mellitus, type 2) (HCC) Continue Sliding scale insulin  as per protocol.   Elevated troponin Chronic stable.  Patient denies any chest pain    Essential hypertension: Continue to monitor closely.   Acute on chronic systolic CHF (congestive heart failure) (HCC) Seems compensated. continue to hold Lasix  for now.  Patient has been exposed to contrast dye.   Tobacco abuse  Counseled to quit.       Subjective: Initially wanting to leave AMA, now agreeable to staying after family talked to pt  Physical Exam: Vitals:   06/25/24 2100 06/25/24 2302 06/26/24 0547 06/26/24 1040  BP: 98/66 110/78 91/66 111/74  Pulse: 66 65 67 64  Resp: 18 18 18 18   Temp: 97.8 F (36.6 C) (!) 97.4 F (36.3 C) 97.7 F (36.5 C)   TempSrc: Oral Oral Oral   SpO2: 99% 100% 100% 99%  Weight:   51 kg   Height:       General exam: Awake, laying in bed, in nad Respiratory system: Normal respiratory effort, no wheezing Cardiovascular system: regular rate, s1, s2 Gastrointestinal system: Soft, nondistended, positive BS Central nervous system: CN2-12 grossly intact, strength intact Extremities: Perfused, no clubbing, Skin: Normal skin turgor, no notable skin lesions seen  Data Reviewed:  Labs reviewed: Na 138, K 4.6, BUN 42, Cr 2.32, WBC 4.7, Hgb 10.7, Plts 145   Family Communication: Pt in room, family at bedside  Disposition: Status is: Inpatient Remains inpatient appropriate because: severity of illness  Planned Discharge Destination: Home with Home Health    Author: Garnette Pelt, MD 06/26/2024 4:40 PM  For on call review [email or web address]  "

## 2024-06-26 NOTE — Plan of Care (Signed)
  Problem: Education: Goal: Ability to describe self-care measures that may prevent or decrease complications (Diabetes Survival Skills Education) will improve Outcome: Progressing   Problem: Coping: Goal: Ability to adjust to condition or change in health will improve Outcome: Progressing   Problem: Health Behavior/Discharge Planning: Goal: Ability to manage health-related needs will improve Outcome: Progressing   Problem: Metabolic: Goal: Ability to maintain appropriate glucose levels will improve Outcome: Progressing   Problem: Skin Integrity: Goal: Risk for impaired skin integrity will decrease Outcome: Progressing

## 2024-06-26 NOTE — TOC Progression Note (Signed)
 Transition of Care (TOC) - Progression Note    Patient Details  Name: Vincent Baxter. MRN: [id number] Date of Birth: 09-28-1934  Transition of Care [facility]) CM/SW Contact  Montie LOISE Vicci, [place] Phone Number: 06/26/2024, 9:44 AM  Clinical Narrative:     CSW met with patient at bedside. Informed insurance denied rehab at skilled nursing facility. CSW inquired about HH- patient states  I will have to think about that.   TOC will continue to follow and assist with discharge planning.  Montie Vicci, MSW, LCSW Clinical Social Worker    Expected Discharge Plan: Skilled Nursing Facility Barriers to Discharge: Continued Medical Work up               Expected Discharge Plan and Services                                               Social Drivers of Health (SDOH) Interventions SDOH Screenings   Food Insecurity: No Food Insecurity (06/19/2024)  Housing: Unknown (06/23/2024)  Transportation Needs: No Transportation Needs (06/23/2024)  Utilities: Not At Risk (06/19/2024)  Alcohol Screen: Low Risk (06/23/2024)  Financial Resource Strain: Low Risk (06/23/2024)  Social Connections: Socially Isolated (06/19/2024)  Tobacco Use: High Risk (06/18/2024)    Readmission Risk Interventions     No data to display

## 2024-06-26 NOTE — Care Management Important Message (Signed)
 Important Message  Patient Details  Name: Vincent Baxter. MRN: [id number] Date of Birth: May 29, 1935   Important Message Given:  Yes - Medicare IM     Vonzell Arrie Sharps 06/26/2024, 11:55 AM

## 2024-06-27 ENCOUNTER — Other Ambulatory Visit (HOSPITAL_COMMUNITY): Payer: Self-pay

## 2024-06-27 DIAGNOSIS — R7989 Other specified abnormal findings of blood chemistry: Secondary | ICD-10-CM | POA: Diagnosis not present

## 2024-06-27 DIAGNOSIS — R600 Localized edema: Secondary | ICD-10-CM | POA: Diagnosis not present

## 2024-06-27 DIAGNOSIS — L97529 Non-pressure chronic ulcer of other part of left foot with unspecified severity: Secondary | ICD-10-CM | POA: Diagnosis not present

## 2024-06-27 LAB — BASIC METABOLIC PANEL WITH GFR
Anion gap: 11 (ref 5–15)
BUN: 41 mg/dL — ABNORMAL HIGH (ref 8–23)
CO2: 19 mmol/L — ABNORMAL LOW (ref 22–32)
Calcium: 8.4 mg/dL — ABNORMAL LOW (ref 8.9–10.3)
Chloride: 104 mmol/L (ref 98–111)
Creatinine, Ser: 2.17 mg/dL — ABNORMAL HIGH (ref 0.61–1.24)
GFR, Estimated: 29 mL/min — ABNORMAL LOW
Glucose, Bld: 104 mg/dL — ABNORMAL HIGH (ref 70–99)
Potassium: 5 mmol/L (ref 3.5–5.1)
Sodium: 134 mmol/L — ABNORMAL LOW (ref 135–145)

## 2024-06-27 LAB — GLUCOSE, CAPILLARY
Glucose-Capillary: 150 mg/dL — ABNORMAL HIGH (ref 70–99)
Glucose-Capillary: 125 mg/dL — ABNORMAL HIGH (ref 70–99)
Glucose-Capillary: 124 mg/dL — ABNORMAL HIGH (ref 70–99)
Glucose-Capillary: 150 mg/dL — ABNORMAL HIGH (ref 70–99)

## 2024-06-27 MED ORDER — HYDROCODONE-ACETAMINOPHEN 5-325 MG PO TABS
1.0000 | ORAL_TABLET | ORAL | 0 refills | Status: DC | PRN
  Filled 2024-06-27: qty 15, 3d supply, fill #0

## 2024-06-27 MED ORDER — AMOXICILLIN-POT CLAVULANATE 500-125 MG PO TABS: 1.0000 | ORAL_TABLET | Freq: Two times a day (BID) | ORAL | 0 refills | Status: AC

## 2024-06-27 MED ORDER — METOPROLOL SUCCINATE ER 25 MG PO TB24: 25.0000 mg | ORAL_TABLET | Freq: Every day | ORAL | 0 refills | Status: AC

## 2024-06-27 MED ORDER — LINEZOLID 600 MG PO TABS: 600.0000 mg | ORAL_TABLET | Freq: Two times a day (BID) | ORAL | 0 refills | Status: AC

## 2024-06-27 MED ORDER — ATORVASTATIN CALCIUM 40 MG PO TABS
40.0000 mg | ORAL_TABLET | Freq: Every day | ORAL | 0 refills | Status: DC
  Filled 2024-06-27: qty 30, 30d supply, fill #0

## 2024-06-27 MED ORDER — CLOPIDOGREL BISULFATE 75 MG PO TABS: 75.0000 mg | ORAL_TABLET | Freq: Every day | ORAL | 0 refills | Status: AC

## 2024-06-27 MED ORDER — ATORVASTATIN CALCIUM 40 MG PO TABS: 40.0000 mg | ORAL_TABLET | Freq: Every day | ORAL | 0 refills | Status: AC

## 2024-06-27 MED ORDER — METOPROLOL SUCCINATE ER 25 MG PO TB24
25.0000 mg | ORAL_TABLET | Freq: Every day | ORAL | 0 refills | Status: DC
  Filled 2024-06-27: qty 30, 30d supply, fill #0

## 2024-06-27 MED ORDER — ASPIRIN 81 MG PO TBEC: 81.0000 mg | DELAYED_RELEASE_TABLET | Freq: Every day | ORAL | 0 refills | Status: AC

## 2024-06-27 MED ORDER — ASPIRIN 81 MG PO TBEC
81.0000 mg | DELAYED_RELEASE_TABLET | Freq: Every day | ORAL | 0 refills | Status: DC
  Filled 2024-06-27: qty 30, 30d supply, fill #0

## 2024-06-27 MED ORDER — HYDROCODONE-ACETAMINOPHEN 5-325 MG PO TABS: 1.0000 | ORAL_TABLET | ORAL | 0 refills | Status: AC | PRN

## 2024-06-27 MED ORDER — AMOXICILLIN-POT CLAVULANATE 500-125 MG PO TABS
1.0000 | ORAL_TABLET | Freq: Two times a day (BID) | ORAL | 0 refills | Status: DC
  Filled 2024-06-27: qty 20, 10d supply, fill #0

## 2024-06-27 MED ORDER — LINEZOLID 600 MG PO TABS
600.0000 mg | ORAL_TABLET | Freq: Two times a day (BID) | ORAL | 0 refills | Status: DC
  Filled 2024-06-27: qty 20, 10d supply, fill #0

## 2024-06-27 MED ORDER — CLOPIDOGREL BISULFATE 75 MG PO TABS
75.0000 mg | ORAL_TABLET | Freq: Every day | ORAL | 0 refills | Status: DC
  Filled 2024-06-27: qty 30, 30d supply, fill #0

## 2024-06-27 NOTE — Plan of Care (Signed)

## 2024-06-27 NOTE — Plan of Care (Signed)
  Problem: Fluid Volume: Goal: Ability to maintain a balanced intake and output will improve Outcome: Progressing   Problem: Metabolic: Goal: Ability to maintain appropriate glucose levels will improve Outcome: Progressing   Problem: Nutritional: Goal: Maintenance of adequate nutrition will improve Outcome: Progressing

## 2024-06-27 NOTE — Progress Notes (Addendum)
 Gave patient AVS Patient said he could read it himself  helped him figure out who ride would be .  Talked to nephew Lamar who is out of town but can come get tomorrowl  Lamar also said that if [employer] (godson) came to visit today, he could take him home.  Floor staff aware.  [facility] and [facility] both I room

## 2024-06-27 NOTE — Discharge Summary (Signed)
 " Physician Discharge Summary   Patient: Vincent Baxter. MRN: [id number] DOB: February 19, 1935  Admit date:     06/18/2024  Discharge date: 06/27/2024  Discharge Physician: Garnette Pelt   [facility]: [facility]   Recommendations at discharge:    Follow up with PCP in 1-2 weeks Follow up with Orthopedic Surgery in 1 week  Discharge Diagnoses: Principal Problem:   Diabetic foot ulcer (HCC) Active Problems:   DM2 (diabetes mellitus, type 2) (HCC)   Essential hypertension   COPD (chronic obstructive pulmonary disease) with emphysema (HCC)   Elevated troponin   Tobacco abuse   CKD (chronic kidney disease) stage 3, GFR 30-59 ml/min (HCC)   Acute on chronic systolic CHF (congestive heart failure) (HCC)   Gangrene of toe of left foot (HCC)  Resolved Problems:   * No resolved hospital problems. *  Hospital Course: 89 year old male with past medical history significant for systolic CHF with EF of 25 to 69%, grade 2 diastolic dysfunction, COPD, type 2 diabetes mellitus, renal mass, nephrolithiasis, abdominal aortic aneurysm, chronic kidney disease stage IIIb, anemia, GERD, BPH, THC and tobacco abuse. Patient was admitted with left lower extremity peripheral artery disease, and left foot ulcer involving the plantar aspect of first left toe. Patient is status post revascularization of left lower extremity arteries. Orthopedic surgery is considering left first ray amputation. Patient was admitted for further evaluation   Assessment and Plan: CKD (chronic kidney disease) stage 3b: Continue to hold furosemide . Avoid nephrotoxins whenever possible. Dose all medications, considering impaired renal function. Monitor renal function and electrolytes daily, Cr overall stable, Cr did trend up to 2.32. Cr improved with gentle hydration overnight   Hyperkalemia: Potassium improved after lokelma     COPD (chronic obstructive pulmonary disease) with emphysema (HCC) Chronic,  stable . will continue  home medications   Ischemic/diabetic foot ulcer (HCC): Plain Images showed no evidence of osteomyelitis. Patient underwent vascular intervention Continue aspirin  Plavix  and a statin.  Patient is optimized from vascular standpoint. Orthopedic surgery team was consulted.   Now on augmentin  and linezolid . Would continue until f/u with Orthopedic Surgeon  Discussed with Orthopedic Surgery. No plan for surgery as inpt. OK to have pt f/u with Dr. Harden in 1 week post-discharge Pt did complain of pain in R groin x 1 day. No significant palpable mass appreciated on exam -CT abd/pelvis reviewed with patient. No acute findings   DM2 (diabetes mellitus, type 2) (HCC) Continue Sliding scale insulin  as per protocol.   Elevated troponin Chronic stable.  Patient denies any chest pain    Essential hypertension: Continue to monitor closely.   Acute on chronic systolic CHF (congestive heart failure) (HCC) Seems compensated. continue to hold Lasix  for now.     Tobacco abuse  Counseled to quit.        Consultants: Vascular Surgery, Orthopedic Surgery Procedures performed: PROCEDURE:   1) Ultrasound guided right common femoral artery access  2) Aortogram with evaluation of the renal arteries 3) Left lower extremity angiogram with second order cannulation  4) Additional left lower extremity with third order cannulation 5) Ultrasound guided retrograde left posterior tibial access at the ankle 6) Complex angioplasty and stenting of the left femoropopliteal artery (6x192mm Eluvia x2) 7) Complex angioplasty of the left tibioperoneal trunk (3x168mm Sterling) 8) Complex angioplasty of the left posterior tibial artery (3x122mm Sterling) 9) Conscious sedation (42 minutes)  Disposition: Home Diet recommendation:  Carb modified diet DISCHARGE MEDICATION: Allergies as of 06/27/2024   No Known Allergies  Medication List     TAKE these medications    amoxicillin -clavulanate 500-125 MG  tablet Commonly known as: AUGMENTIN  Take 1 tablet by mouth every 12 (twelve) hours for 10 days.   aspirin  EC 81 MG tablet Take 1 tablet (81 mg total) by mouth daily. Swallow whole. Start taking on: June 28, 2024   atorvastatin  40 MG tablet Commonly known as: LIPITOR Take 1 tablet (40 mg total) by mouth daily. Start taking on: June 28, 2024   clopidogrel  75 MG tablet Commonly known as: PLAVIX  Take 1 tablet (75 mg total) by mouth daily with breakfast. Start taking on: June 28, 2024   HYDROcodone -acetaminophen  5-325 MG tablet Commonly known as: NORCO/VICODIN Take 1 tablet by mouth every 4 (four) hours as needed for moderate pain (pain score 4-6).   linezolid  600 MG tablet Commonly known as: ZYVOX  Take 1 tablet (600 mg total) by mouth every 12 (twelve) hours for 10 days.   metoprolol  succinate 25 MG 24 hr tablet Commonly known as: TOPROL -XL Take 1 tablet (25 mg total) by mouth daily. Start taking on: June 28, 2024               Durable Medical Equipment  (From admission, onward)           Start     Ordered   06/27/24 1240  For home use only DME Bedside commode  Once       Question:  Patient needs a bedside commode to treat with the following condition  Answer:  CHF (congestive heart failure) (HCC)   06/27/24 1239   06/27/24 1237  For home use only DME Walker rolling  Once       Question Answer Comment  Walker: With 5 Inch Wheels   Patient needs a walker to treat with the following condition CHF (congestive heart failure) (HCC)      06/27/24 1239            Contact information for follow-up providers     [facility] [place] Follow up in 4 week(s).   Specialty: Vascular Surgery Why: Office will call to arrange your appt(s) Contact information: [address] [place]  [address] [phone]        [facility]. Go in 10  day(s).   Specialty: Cardiology Why: Hospital follow up 07/03/2024 @ 10:30 am PLEASE bring a current medication to appointment FREE valet parking, Entrance C, off [facility] information: [address] [place]  [address] [phone]        Harden Jerona GAILS, MD Follow up in 1 week(s).   Specialty: Orthopedic Surgery Contact information: [address] [place]  [place] [address] [phone]         [facility] Follow up in 2 week(s).   Why: Hospital follow up Contact information: [address] [facility] [place] [address] [phone]              Contact information for after-discharge care     Destination     [facility] Preferred SNF .   Service: Skilled Nursing Contact information: [address] [place]  [address] [phone]                    Discharge Exam: Filed Weights   06/24/24 0542 06/26/24 0547 06/27/24 0610  Weight: 50  kg 51 kg 51.1 kg   General exam: Awake, laying in bed, in nad Respiratory system: Normal respiratory effort, no wheezing Cardiovascular system: regular rate, s1, s2 Gastrointestinal system: Soft, nondistended, positive BS Central nervous system: CN2-12 grossly intact, strength intact Extremities: Perfused, no clubbing Skin: Normal skin turgor, no notable skin lesions seen Psychiatry: Mood normal // no visual hallucinations   Condition at discharge: fair  The results of significant diagnostics from this hospitalization (including imaging, microbiology, ancillary and laboratory) are listed below for reference.   Imaging Studies: CT ABDOMEN PELVIS WO CONTRAST Result Date: 06/26/2024 EXAM: CT ABDOMEN AND PELVIS WITHOUT CONTRAST 06/26/2024 04:12:00 PM TECHNIQUE: CT of the abdomen and pelvis was performed without the administration of intravenous contrast. Multiplanar reformatted  images are provided for review. Automated exposure control, iterative reconstruction, and/or weight-based adjustment of the mA/kV was utilized to reduce the radiation dose to as low as reasonably achievable. COMPARISON: CT chest abdomen and pelvis 03/12/2024. CLINICAL HISTORY: Abdominal pain, acute, nonlocalized. FINDINGS: LOWER CHEST: There are small bilateral pleural effusions which have increased. There is bibasilar atelectasis. Emphysema present. There is a small pericardial effusion. LIVER: The liver is unremarkable. GALLBLADDER AND BILE DUCTS: Gallbladder is unremarkable. No biliary ductal dilatation. SPLEEN: No acute abnormality. PANCREAS: No acute abnormality. ADRENAL GLANDS: No acute abnormality. KIDNEYS, URETERS AND BLADDER: Renal calculi are present in the inferior pole of the right kidney measuring 5 mm. Right renal atrophy is present. The left kidney is within normal limits. No hydronephrosis. No perinephric or periureteral stranding. The urinary bladder is unremarkable. GI AND BOWEL: The stomach demonstrates no acute abnormality. The appendix is not seen. There is sigmoid colon diverticulosis. There is no bowel obstruction. PERITONEUM AND RETROPERITONEUM: No ascites. No free air. VASCULATURE: An abdominal aortic aneurysm measures 4.8 x 4.0 cm, similar to the prior study. There are atherosclerotic calcifications of the aorta and iliac arteries. LYMPH NODES: No lymphadenopathy. REPRODUCTIVE ORGANS: No acute abnormality. BONES AND SOFT TISSUES: No acute osseous abnormality. No focal soft tissue abnormality. IMPRESSION: 1. No acute findings in the abdomen or pelvis. 2. Small bilateral pleural effusions with bibasilar atelectasis. 3. Small pericardial effusion. 4. Stable Infrarenal abdominal aortic aneurysm measuring 4.8 x 4.0 cm. 5. 5 mm right lower pole renal calculus with right renal atrophy. 6. Sigmoid colon diverticulosis without evidence of diverticulitis. 7. Emphysema. Electronically signed by: Greig Pique MD MD 06/26/2024 05:08 PM EST RP Workstation: [id number]   ECHOCARDIOGRAM COMPLETE Result Date: 06/21/2024    ECHOCARDIOGRAM REPORT   Patient Name:   Vincent Baxter. Date of Exam: 06/21/2024 Medical Rec #:  [id number]          Height:       67.0 in Accession #:    [id number]         Weight:       121.7 lb Date of Birth:  01-Feb-1935         BSA:          1.637 m Patient Age:    89 years           BP:           95/68 mmHg Patient Gender: M                  HR:           82 bpm. Exam Location:  Inpatient Procedure: 2D Echo, 3D Echo, Cardiac Doppler and Color Doppler (Both Spectral  and Color Flow Doppler were utilized during procedure). Indications:    CHF- Acute Systolic I50.21  History:        Patient has prior history of Echocardiogram examinations, most                 recent 03/13/2024. CKD; Risk Factors:Diabetes, Hypertension and                 Dyslipidemia.  Sonographer:    Koleen Popper RDCS Referring Phys: ANASTASSIA DOUTOVA  Sonographer Comments: Image acquisition challenging due to patient body habitus. IMPRESSIONS  1. Left ventricular ejection fraction, by estimation, is 25 to 30%. The left ventricle has severely decreased function. The left ventricle demonstrates global hypokinesis. There is moderate concentric left ventricular hypertrophy. Left ventricular diastolic function could not be evaluated.  2. Right ventricular systolic function is moderately reduced. The right ventricular size is normal. Severely increased right ventricular wall thickness. There is mildly elevated pulmonary artery systolic pressure. The estimated right ventricular systolic pressure is 42.3 mmHg.  3. Moderate pericardial effusion. The pericardial effusion is circumferential and anterior to the right ventricle.  4. The mitral valve is degenerative. Trivial mitral valve regurgitation. No evidence of mitral stenosis.  5. The aortic valve is calcified. There is moderate calcification of the aortic valve. There  is moderate thickening of the aortic valve. Aortic valve regurgitation is not visualized. Aortic valve sclerosis/calcification is present, without any evidence of aortic stenosis.  6. The inferior vena cava is dilated in size with >50% respiratory variability, suggesting right atrial pressure of 8 mmHg.  7. Given findings of severe biventricular hypertrophy with speckeled pattern, biventricular failure, biatrial enlargement and pericardial effusion, consider amyloidosis FINDINGS  Left Ventricle: Left ventricular ejection fraction, by estimation, is 25 to 30%. The left ventricle has severely decreased function. The left ventricle demonstrates global hypokinesis. The left ventricular internal cavity size was normal in size. There is moderate concentric left ventricular hypertrophy. Left ventricular diastolic function could not be evaluated. Right Ventricle: The right ventricular size is normal. Severely increased right ventricular wall thickness. Right ventricular systolic function is moderately reduced. There is mildly elevated pulmonary artery systolic pressure. The tricuspid regurgitant velocity is 2.93 m/s, and with an assumed right atrial pressure of 8 mmHg, the estimated right ventricular systolic pressure is 42.3 mmHg. Left Atrium: Left atrial size was normal in size. Right Atrium: Right atrial size was normal in size. Pericardium: A moderately sized pericardial effusion is present. The pericardial effusion is circumferential and anterior to the right ventricle. Mitral Valve: The mitral valve is degenerative in appearance. There is moderate thickening of the mitral valve leaflet(s). Trivial mitral valve regurgitation. No evidence of mitral valve stenosis. Tricuspid Valve: The tricuspid valve is normal in structure. Tricuspid valve regurgitation is mild . No evidence of tricuspid stenosis. Aortic Valve: The aortic valve is calcified. There is moderate calcification of the aortic valve. There is moderate  thickening of the aortic valve. Aortic valve regurgitation is not visualized. Aortic valve sclerosis/calcification is present, without any  evidence of aortic stenosis. Pulmonic Valve: The pulmonic valve was normal in structure. Pulmonic valve regurgitation is not visualized. No evidence of pulmonic stenosis. Aorta: The aortic root is normal in size and structure. Venous: The inferior vena cava is dilated in size with greater than 50% respiratory variability, suggesting right atrial pressure of 8 mmHg. IAS/Shunts: The interatrial septum appears to be lipomatous. No atrial level shunt detected by color flow Doppler.  LEFT VENTRICLE PLAX 2D LVIDd:  3.80 cm      Diastology LVIDs:         3.25 cm      LV e' medial:   3.85 cm/s LV PW:         1.40 cm      LV E/e' medial: 26.2 LV IVS:        1.60 cm LVOT diam:     1.80 cm LV SV:         23 LV SV Index:   14 LVOT Area:     2.54 cm  LV Volumes (MOD) LV vol d, MOD A4C: 113.0 ml LV vol s, MOD A4C: 79.9 ml LV SV MOD A4C:     113.0 ml RIGHT VENTRICLE            IVC RV S prime:     9.96 cm/s  IVC diam: 2.10 cm TAPSE (M-mode): 1.1 cm LEFT ATRIUM           Index LA diam:      3.60 cm 2.20 cm/m LA Vol (A2C): 43.6 ml 26.63 ml/m LA Vol (A4C): 54.0 ml 32.98 ml/m  AORTIC VALVE LVOT Vmax:   57.80 cm/s LVOT Vmean:  36.400 cm/s LVOT VTI:    0.089 m  AORTA Ao Root diam: 3.50 cm MITRAL VALVE                TRICUSPID VALVE MV Area (PHT): 5.38 cm     TR Peak grad:   34.3 mmHg MV Decel Time: 141 msec     TR Vmax:        293.00 cm/s MV E velocity: 101.00 cm/s MV A velocity: 39.30 cm/s   SHUNTS MV E/A ratio:  2.57         Systemic VTI:  0.09 m                             Systemic Diam: 1.80 cm Wilbert Bihari MD Electronically signed by Wilbert Bihari MD Signature Date/Time: 06/21/2024/11:15:17 AM    Final    VAS US  ABI WITH/WO TBI Result Date: 06/20/2024  LOWER EXTREMITY DOPPLER STUDY Patient Name:  Vincent ASAIAH SCARBER.  Date of Exam:   06/19/2024 Medical Rec #: [id number]           Accession  #:    [id number] Date of Birth: 09-14-1934          Patient Gender: M Patient Age:   68 years Exam [facility]  [facility] Procedure:      VAS US  ABI WITH/WO TBI Referring Phys: ANASTASSIA DOUTOVA --------------------------------------------------------------------------------  Indications: Ulceration. High Risk Factors: Hypertension, Diabetes, current smoker.  Limitations: Today's exam was limited due to an open wound and involuntary              patient movement. Comparison Study: 03/13/2024 - Right: Resting right ankle-brachial index is                   within normal range. The                   right toe-brachial index is abnormal.                    Left: Resting left ankle-brachial index indicates                   noncompressible left  lower extremity arteries. The left toe-brachial index is                   absent. Performing Technologist: Gerome Ny RVT  Examination Guidelines: A complete evaluation includes at minimum, Doppler waveform signals and systolic blood pressure reading at the level of bilateral brachial, anterior tibial, and posterior tibial arteries, when vessel segments are accessible. Bilateral testing is considered an integral part of a complete examination. Photoelectric Plethysmograph (PPG) waveforms and toe systolic pressure readings are included as required and additional duplex testing as needed. Limited examinations for reoccurring indications may be performed as noted.  ABI Findings: +---------+------------------+-----+----------+--------+ Right    Rt Pressure (mmHg)IndexWaveform  Comment  +---------+------------------+-----+----------+--------+ Brachial 136                    triphasic          +---------+------------------+-----+----------+--------+ PTA      131               0.96 monophasic         +---------+------------------+-----+----------+--------+ DP       129               0.95 monophasic          +---------+------------------+-----+----------+--------+ Great Toe63                0.46                    +---------+------------------+-----+----------+--------+ +---------+------------------+-----+----------+-------+ Left     Lt Pressure (mmHg)IndexWaveform  Comment +---------+------------------+-----+----------+-------+ Brachial 130                    triphasic         +---------+------------------+-----+----------+-------+ PTA      150               1.10 monophasic        +---------+------------------+-----+----------+-------+ DP       78                0.57 monophasic        +---------+------------------+-----+----------+-------+ Great Toe                       Absent    Ulcer   +---------+------------------+-----+----------+-------+ +-------+-----------+-----------+------------+------------+ ABI/TBIToday's ABIToday's TBIPrevious ABIPrevious TBI +-------+-----------+-----------+------------+------------+ Right  0.96       0.46                                +-------+-----------+-----------+------------+------------+ Left   1.1                                            +-------+-----------+-----------+------------+------------+  Summary: Right: Resting right ankle-brachial index is within normal range. The right toe-brachial index is abnormal.  ABI's are likely falsely elevated due to medial calcification. Left: Resting left ankle-brachial index is within normal range.  ABI's are likely falsely elevated due to medial calcification. Unable to obtain TBI due to great toe ulceration. *See table(s) above for measurements and observations.  Electronically signed by Penne Colorado MD on 06/20/2024 at 10:40:50 AM.    Final    PERIPHERAL VASCULAR CATHETERIZATION Result Date: 06/19/2024 DATE OF SERVICE: 06/19/2024  PATIENT:  Vincent Baxter.  89 y.o. male  PRE-OPERATIVE DIAGNOSIS:  Atherosclerosis  of native arteries of left lower extremity causing ulceration   POST-OPERATIVE DIAGNOSIS:  Same  PROCEDURE:  1) Ultrasound guided right common femoral artery access 2) Aortogram with evaluation of the renal arteries 3) Left lower extremity angiogram with second order cannulation 4) Additional left lower extremity with third order cannulation 5) Ultrasound guided retrograde left posterior tibial access at the ankle 6) Complex angioplasty and stenting of the left femoropopliteal artery (6x178mm Eluvia x2) 7) Complex angioplasty of the left tibioperoneal trunk (3x111mm Sterling) 8) Complex angioplasty of the left posterior tibial artery (3x181mm Sterling) 9) Conscious sedation (42 minutes)  SURGEON:  Debby SAILOR. Magda, MD  ASSISTANT: none  ANESTHESIA:   local and IV sedation  ESTIMATED BLOOD LOSS: min  LOCAL MEDICATIONS USED:  LIDOCAINE   COUNTS: confirmed correct.  PATIENT DISPOSITION:  PACU - hemodynamically stable.  Delay start of Pharmacological VTE agent (>24hrs) due to surgical blood loss or risk of bleeding: no  INDICATION FOR PROCEDURE: Vincent Baxter. is a 89 y.o. male with left great toe ischemic ulceration in the setting of severe peripheral arterial disease. After careful discussion of risks, benefits, and alternatives the patient was offered angiography with possible intervention. The patient understood and wished to proceed.  OPERATIVE FINDINGS:  Globally slow transit of contrast worrisome for heart failure.  Aortogram: Renal arteries Left: >60% stenosis    Right: >60% stenosis Infrarenal aorta: patent Common iliac arteries: Left: patent      Right: patent Internal iliac arteries: Left: patent      Right: patent External iliac arteries: Left: patent      Right: patent  Left Lower Extremity Angiogram:             Common femoral artery: patent             Profunda femoris artery: two, codominant profunda femoris arteries that are patent             Superficial femoral artery: patent proximally; occluded in mid vessel. Severely diseased throughout the distal vessel.  Calcified lesion.             Popliteal artery: reconstitutes above the knee. Severely diseased above and behind the knee. Calcified.             Anterior tibial artery: patent proximally. Occludes in proximal calf. DP reconstitutes in the foot.             Tibioperoneal trunk: occluded             Peroneal artery: occluded             Posterior tibial artery: occluded proximally. Reconstitutes in the calf. Dominant tibial. Fills the foot             Pedal circulation: Disadvantaged  GLASS score. FP: 3. IP: 3  WIfI score. Wound: 2; ischemia: 3; infection: 0. Stage: 4  DESCRIPTION OF PROCEDURE: After identification of the patient in the pre-operative holding area, the patient was transferred to the operating room. The patient was positioned supine on the operating room table. Anesthesia was induced. The groins was prepped and draped in standard fashion. A surgical pause was performed confirming correct patient, procedure, and operative location.  The right groin was anesthetized with subcutaneous injection of 1% lidocaine . Using ultrasound guidance, the right common femoral artery was accessed with micropuncture technique.  Fluoroscopy was used to confirm cannulation over the femoral head. The 67F micropuncture sheath was upsized to 73F.  A Benson wire was advanced into the distal  aorta. Over the wire an omni flush catheter was advanced to the level of L2. Aortogram was performed - see above for details.  The left common iliac artery was selected with an omniflush catheter and glidewire advantage guidewire. The wire was advanced into the common femoral artery. Over the wire the omni flush catheter was advanced into the external iliac artery. Selective angiography was performed - see above for details.  The decision was made to intervene. The patient was heparinized with 6000 units of heparin . The 69F sheath was exchanged for a 53F x 45cm sheath. Selective angiography of the left lower extremity performed prior to  intervention.  I was able to cross the femoral-popliteal occlusion antegrade.  Below the knee there was a flush occlusion at the origin of the tibioperoneal trunk which I could not cross antegrade.  Posterior tibial artery at the ankle was accessed in a retrograde fashion using ultrasound access with a micropuncture kit.  After obtaining access, wire was navigated into the posterior tibial artery and a micro sheath introduced into the artery.  An 018 12 g weighted tip shepherd wire was used to cross the posterior tibial and tibioperoneal trunk occlusion.  After confirming our position in the true lumen the 018 wire was snared from the contralateral access with a lasso type snare.  The wire was externalized through the right common femoral artery access sheath.  We then began our intervention.  The posterior tibial artery was angioplastied up to the sheath with a 3 x 150 mm Sterling balloon.  The tibioperoneal trunk was similarly angioplastied with a 3 x 150 mm Sterling balloon.  Two 6 x 150 mm Eluvia stents were deployed in the femoral-popliteal position from above-the-knee to proximal third of SFA.  These were postdilated with a 5 x 150 mm Sterling balloon.  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 Flow to the level of the sheath was achieved.  A Perclose was used to close the arteriotomy in the right common femoral artery. Hemostasis was excellent upon completion. The left posterior tibial sheath was left in place to be removed in the recovery area.  Conscious sedation was administered with the use of IV fentanyl  and midazolam  under continuous physician and nurse monitoring.  Heart rate, blood pressure, and oxygen saturation were continuously monitored.  Total sedation time was 42 minutes  Upon completion of the case instrument and sharps counts were confirmed correct. The  patient was transferred to the PACU in good condition. I was present for all portions of the procedure.  PLAN: Aspirin , Plavix , statin therapies.  Optimized from a vascular standpoint.  He is cleared to undergo any kind of foot surgery necessary.  Follow-up with me in 4 weeks with ABI and left lower extremity arterial duplex.  Debby SAILOR. Magda, MD [facility] Phone Number: [phone] 06/19/2024 4:18 PM    DG Chest 2 View Result Date: 06/18/2024 CLINICAL DATA:  Cellulitis.  Weakness. EXAM: CHEST - 2 VIEW; RIGHT FOOT COMPLETE - 3+ VIEW COMPARISON:  03/12/2024, 03/23/2022. FINDINGS: Chest: The heart size and mediastinal contours are within normal limits. There is atherosclerotic calcification of the aorta. Stable interstitial prominence and emphysematous changes are noted bilaterally. There is mild atelectasis or scarring at the left lung base. No effusion or pneumothorax is seen. No acute osseous abnormality. Right foot: No acute fracture or dislocation is seen. There is  hallux valgus deformity with degenerative changes at the first metatarsophalangeal joint. Degenerative changes are present in the midfoot. There is diffuse soft tissue swelling. Vascular calcifications are noted in the soft tissues. IMPRESSION: 1. Stable chest with no acute cardiopulmonary process. 2. No acute osseous abnormality in the right foot. Electronically Signed   By: Leita Birmingham M.D.   On: 06/18/2024 16:54   DG Foot Complete Right Result Date: 06/18/2024 CLINICAL DATA:  Cellulitis.  Weakness. EXAM: CHEST - 2 VIEW; RIGHT FOOT COMPLETE - 3+ VIEW COMPARISON:  03/12/2024, 03/23/2022. FINDINGS: Chest: The heart size and mediastinal contours are within normal limits. There is atherosclerotic calcification of the aorta. Stable interstitial prominence and emphysematous changes are noted bilaterally. There is mild atelectasis or scarring at the left lung base. No effusion or pneumothorax is seen. No  acute osseous abnormality. Right foot: No acute fracture or dislocation is seen. There is hallux valgus deformity with degenerative changes at the first metatarsophalangeal joint. Degenerative changes are present in the midfoot. There is diffuse soft tissue swelling. Vascular calcifications are noted in the soft tissues. IMPRESSION: 1. Stable chest with no acute cardiopulmonary process. 2. No acute osseous abnormality in the right foot. Electronically Signed   By: Leita Birmingham M.D.   On: 06/18/2024 16:54   DG Foot Complete Left Result Date: 06/18/2024 CLINICAL DATA:  Cellulitis. EXAM: LEFT FOOT - COMPLETE 3+ VIEW COMPARISON:  Left foot radiograph dated 03/23/2022. FINDINGS: No acute fracture or dislocation. The bones are osteopenic. No periosteal elevation or bone erosion. The soft tissue swelling of the foot. No acute pathology or soft tissue gas. Vascular calcification. IMPRESSION: 1. No acute fracture or dislocation. 2. Soft tissue swelling of the foot. Electronically Signed   By: Vanetta Chou M.D.   On: 06/18/2024 16:50    Microbiology: Results for orders placed or performed during the hospital encounter of 06/18/24  Culture, blood (Routine X 2) w Reflex to ID Panel     Status: None   Collection Time: 06/20/24  7:39 PM   Specimen: BLOOD  Result Value Ref Range Status   Specimen Description BLOOD SITE NOT SPECIFIED  Final   Special Requests   Final    BOTTLES DRAWN AEROBIC AND ANAEROBIC Blood Culture adequate volume   Culture   Final    NO GROWTH 5 DAYS Performed at [facility], [address] [place], [place] [address]    Report Status 06/25/2024 FINAL  Final  Culture, blood (Routine X 2) w Reflex to ID Panel     Status: None   Collection Time: 06/20/24  7:51 PM   Specimen: BLOOD  Result Value Ref Range Status   Specimen Description BLOOD SITE NOT SPECIFIED  Final   Special Requests   Final    BOTTLES DRAWN AEROBIC AND ANAEROBIC Blood Culture adequate volume   Culture    Final    NO GROWTH 5 DAYS Performed at [facility], [address], [place], [place] [address]    Report Status 06/25/2024 FINAL  Final    Labs: CBC: Recent Labs  Lab 06/20/24 1939 06/26/24 1459  WBC 8.3 4.7  NEUTROABS 6.9  --   HGB 11.9* 10.7*  HCT 33.4* 30.9*  MCV 96.0 96.0  PLT 128* 145*   Basic Metabolic Panel: Recent Labs  Lab 06/20/24 1939 06/21/24 1330 06/22/24 0352 06/23/24 0339 06/23/24 0340 06/23/24 1201 06/24/24 0331 06/25/24 0415 06/26/24 1459 06/27/24 0302  NA 138 136   < > 137 137  --  137 136 138 134*  K 5.6* 5.3*   < > 5.2* 5.2* 5.5* 5.4* 5.0 4.6 5.0  CL 103 103   < > 103 102  --  106 104 105 104  CO2 25 22   < > 26 26  --  23 22 21* 19*  GLUCOSE 156* 190*   < > 167* 165*  --  190* 146* 136* 104*  BUN 42* 42*   < > 44* 44*  --  41* 40* 42* 41*  CREATININE 1.75* 1.75*   < > 2.13* 2.11*  --  2.11* 2.25* 2.32* 2.17*  CALCIUM  8.8* 8.6*   < > 8.6* 8.4*  --  9.0 8.7* 8.6* 8.4*  MG  --   --   --  1.9  --   --   --   --   --   --   PHOS 3.3 3.4  --   --  3.4  --   --   --   --   --    < > = values in this interval not displayed.   Liver Function Tests: Recent Labs  Lab 06/20/24 1939 06/21/24 1330 06/23/24 0340 06/25/24 0415 06/26/24 1459  AST  --   --   --  18 21  ALT  --   --   --  16 19  ALKPHOS  --   --   --  95 96  BILITOT  --   --   --  0.4 0.5  PROT  --   --   --  5.4* 5.7*  ALBUMIN 3.7 3.5 3.3* 3.2* 3.4*   CBG: Recent Labs  Lab 06/26/24 1041 06/26/24 1809 06/26/24 2107 06/27/24 0613 06/27/24 1148  GLUCAP 164* 215* 119* 150* 125*    Discharge time spent: less than 30 minutes.  Signed: Garnette Pelt, MD [facility] 06/27/2024 "

## 2024-06-27 NOTE — Progress Notes (Signed)
 RNCM received DME order for RW and BSC.  Jermaine at [facility] contacted with orders and confirmation received.  DME to be delivered to patient's room prior to discharge home.

## 2024-06-28 ENCOUNTER — Inpatient Hospital Stay (HOSPITAL_COMMUNITY)

## 2024-06-28 DIAGNOSIS — R7989 Other specified abnormal findings of blood chemistry: Secondary | ICD-10-CM | POA: Diagnosis not present

## 2024-06-28 DIAGNOSIS — L97529 Non-pressure chronic ulcer of other part of left foot with unspecified severity: Secondary | ICD-10-CM | POA: Diagnosis not present

## 2024-06-28 LAB — GLUCOSE, CAPILLARY
Glucose-Capillary: 141 mg/dL — ABNORMAL HIGH (ref 70–99)
Glucose-Capillary: 142 mg/dL — ABNORMAL HIGH (ref 70–99)
Glucose-Capillary: 147 mg/dL — ABNORMAL HIGH (ref 70–99)
Glucose-Capillary: 181 mg/dL — ABNORMAL HIGH (ref 70–99)

## 2024-06-28 MED ORDER — TAMSULOSIN HCL 0.4 MG PO CAPS
0.4000 mg | ORAL_CAPSULE | Freq: Every day | ORAL | Status: DC
  Administered 2024-06-28: 0.4 mg via ORAL
  Filled 2024-06-28: qty 1

## 2024-06-28 MED ORDER — TAMSULOSIN HCL 0.4 MG PO CAPS: 0.4000 mg | ORAL_CAPSULE | Freq: Every day | ORAL | 0 refills | Status: AC

## 2024-06-28 NOTE — Progress Notes (Signed)
 VM left for nephew inquiring about ride home for Pt

## 2024-06-28 NOTE — Progress Notes (Signed)
 " Progress Note   Patient: Vincent Baxter. FMW:[id number] DOB: 09/14/1934 DOA: 06/18/2024     10 DOS: the patient was seen and examined on 06/28/2024   Brief hospital course: 89 year old male with past medical history significant for systolic CHF with EF of 25 to 69%, grade 2 diastolic dysfunction, COPD, type 2 diabetes mellitus, renal mass, nephrolithiasis, abdominal aortic aneurysm, chronic kidney disease stage IIIb, anemia, GERD, BPH, THC and tobacco abuse. Patient was admitted with left lower extremity peripheral artery disease, and left foot ulcer involving the plantar aspect of first left toe. Patient is status post revascularization of left lower extremity arteries. Orthopedic surgery is considering left first ray amputation. Patient was admitted for further evaluation   Assessment and Plan: CKD (chronic kidney disease) stage 3b: Continue to hold furosemide . Avoid nephrotoxins whenever possible. Dose all medications, considering impaired renal function. Monitor renal function and electrolytes daily, Cr overall stable, Cr did trend up to 2.32. 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 will continue home medications   Ischemic/diabetic foot ulcer (HCC): Plain Images showed no evidence of osteomyelitis. Patient underwent vascular intervention Continue aspirin  Plavix  and a statin.  Patient is optimized from vascular standpoint. Orthopedic surgery team was consulted.   Now on augmentin  and linezolid . Would continue until f/u with Orthopedic Surgeon  Discussed with Orthopedic Surgery. No plan for surgery as inpt. OK to have pt f/u with Dr. Harden in 1 week post-discharge Pt did complain of pain in R groin x 1 day. No significant palpable mass appreciated on exam -CT abd/pelvis reviewed with patient. No acute findings   DM2 (diabetes mellitus, type 2)  (HCC) Continue Sliding scale insulin  as per protocol.   Elevated troponin Chronic stable.  Patient denies any chest pain    Essential hypertension: Continue to monitor closely.   Acute on chronic systolic CHF (congestive heart failure) (HCC) Seems compensated. continue to hold Lasix  for now.     Tobacco abuse  Counseled to quit.   R kidney stone -Pt complained of R sided posterior back pain with radiation to the groin x 1 day -CXR reviewed, no evidence of rib fx -Recent CT abd demonstrated non-obstructing 5mm R sided renal calculi -Discussed with Urology. Rec for pain mgt and trial of flomax . Prescribed -Pt encouraged to improve hydration -Pt later re-assessed after receiving oral opioid. Pt reports pain has resolved        Subjective: complaining of R sided back pain  Physical Exam: Vitals:   06/28/24 0354 06/28/24 0602 06/28/24 0821 06/28/24 1725  BP: 110/80  121/76 121/83  Pulse: 70   64  Resp: 20   19  Temp: 97.6 F (36.4 C)  97.6 F (36.4 C) (!) 97.2 F (36.2 C)  TempSrc: Oral  Oral Oral  SpO2: 100%  96% 97%  Weight:  53.4 kg    Height:       General exam: Conversant, in no acute distress Respiratory system: normal chest rise, clear, no audible wheezing Cardiovascular system: regular rhythm, s1-s2 Gastrointestinal system: Nondistended, nontender, pos BS Central nervous system: No seizures, no tremors Extremities: No cyanosis, no joint deformities Skin: No rashes, no pallor Psychiatry: Affect normal // no auditory hallucinations   Data Reviewed:  There are no new results to review at this time.  Family Communication: Pt in room, family at bedside  Disposition: Status is: Inpatient Remains inpatient appropriate  because: severity of illness  Planned Discharge Destination: Home with Home Health    Author: Garnette Pelt, MD 06/28/2024 5:57 PM  For on call review [email or web address].  "

## 2024-06-28 NOTE — Plan of Care (Signed)

## 2024-06-28 NOTE — Progress Notes (Signed)
 Family Robert refusing to take patient home  home is unlivable   Vincent Baxter is working to get him to the [place] and will be back to hospital today or will call to follow up

## 2024-06-28 NOTE — Progress Notes (Signed)
 Spoke with patient nephew will be here 10:30 ish

## 2024-06-29 DIAGNOSIS — L97529 Non-pressure chronic ulcer of other part of left foot with unspecified severity: Secondary | ICD-10-CM | POA: Diagnosis not present

## 2024-06-29 DIAGNOSIS — R7989 Other specified abnormal findings of blood chemistry: Secondary | ICD-10-CM | POA: Diagnosis not present

## 2024-06-29 LAB — GLUCOSE, CAPILLARY: Glucose-Capillary: 141 mg/dL — ABNORMAL HIGH (ref 70–99)

## 2024-06-29 NOTE — Discharge Summary (Signed)
 " Physician Discharge Summary   Patient: Vincent Baxter. MRN: [id number] DOB: Jan 09, 1935  Admit date:     06/18/2024  Discharge date: 06/29/2024  Discharge Physician: Garnette Pelt   [facility]: [facility]   Recommendations at discharge:    Follow up with PCP in 1-2 weeks Follow up with Orthopedic Surgery in 1 week  Discharge Diagnoses: Principal Problem:   Diabetic foot ulcer (HCC) Active Problems:   DM2 (diabetes mellitus, type 2) (HCC)   Essential hypertension   COPD (chronic obstructive pulmonary disease) with emphysema (HCC)   Elevated troponin   Tobacco abuse   CKD (chronic kidney disease) stage 3, GFR 30-59 ml/min (HCC)   Acute on chronic systolic CHF (congestive heart failure) (HCC)   Gangrene of toe of left foot (HCC)  Resolved Problems:   * No resolved hospital problems. *  Hospital Course: 89 year old male with past medical history significant for systolic CHF with EF of 25 to 69%, grade 2 diastolic dysfunction, COPD, type 2 diabetes mellitus, renal mass, nephrolithiasis, abdominal aortic aneurysm, chronic kidney disease stage IIIb, anemia, GERD, BPH, THC and tobacco abuse. Patient was admitted with left lower extremity peripheral artery disease, and left foot ulcer involving the plantar aspect of first left toe. Patient is status post revascularization of left lower extremity arteries. Orthopedic surgery is considering left first ray amputation. Patient was admitted for further evaluation   Assessment and Plan: CKD (chronic kidney disease) stage 3b: Continue to hold furosemide . Avoid nephrotoxins whenever possible. Dose all medications, considering impaired renal function. Monitor renal function and electrolytes daily, Cr overall stable, Cr did trend up to 2.32. 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 will continue home  medications   Ischemic/diabetic foot ulcer (HCC): Plain Images showed no evidence of osteomyelitis. Patient underwent vascular intervention Continue aspirin  Plavix  and a statin.  Patient is optimized from vascular standpoint. Orthopedic surgery team was consulted.   Now on augmentin  and linezolid . Would continue until f/u with Orthopedic Surgeon  Discussed with Orthopedic Surgery. No plan for surgery as inpt. OK to have pt f/u with Dr. Harden in 1 week post-discharge Pt did complain of pain in R groin x 1 day. No significant palpable mass appreciated on exam -CT abd/pelvis reviewed with patient. No acute findings   DM2 (diabetes mellitus, type 2) (HCC) Continue Sliding scale insulin  as per protocol.   Elevated troponin Chronic stable.  Patient denies any chest pain    Essential hypertension: Continue to monitor closely.   Acute on chronic systolic CHF (congestive heart failure) (HCC) Seems compensated. continue to hold Lasix  for now.     Tobacco abuse  Counseled to quit.    R kidney stone -Pt complained of R sided posterior back pain with radiation to the groin x 1 day -CXR reviewed, no evidence of rib fx -Recent CT abd demonstrated non-obstructing 5mm R sided renal calculi -Discussed with Urology. Rec for pain mgt and trial of flomax . Prescribed -Pt encouraged to improve hydration -Pt later re-assessed after receiving oral opioid. Pt reports pain has resolved         Consultants: Vascular Surgery, Orthopedic Surgery Procedures performed: PROCEDURE:   1) Ultrasound guided right common femoral artery access  2) Aortogram with evaluation of the renal arteries 3) Left lower extremity angiogram with second order cannulation  4) Additional left lower extremity with third order cannulation 5) Ultrasound  guided retrograde left posterior tibial access at the ankle 6) Complex angioplasty and stenting of the left femoropopliteal artery (6x167mm Eluvia x2) 7) Complex angioplasty of  the left tibioperoneal trunk (3x149mm Sterling) 8) Complex angioplasty of the left posterior tibial artery (3x178mm Sterling) 9) Conscious sedation (42 minutes)  Disposition: Home Diet recommendation:  Carb modified diet DISCHARGE MEDICATION: Allergies as of 06/29/2024   No Known Allergies      Medication List     TAKE these medications    amoxicillin -clavulanate 500-125 MG tablet Commonly known as: AUGMENTIN  Take 1 tablet by mouth every 12 (twelve) hours for 10 days.   aspirin  EC 81 MG tablet Take 1 tablet (81 mg total) by mouth daily. Swallow whole.   atorvastatin  40 MG tablet Commonly known as: LIPITOR Take 1 tablet (40 mg total) by mouth daily.   clopidogrel  75 MG tablet Commonly known as: PLAVIX  Take 1 tablet (75 mg total) by mouth daily with breakfast.   HYDROcodone -acetaminophen  5-325 MG tablet Commonly known as: NORCO/VICODIN Take 1 tablet by mouth every 4 (four) hours as needed for moderate pain (pain score 4-6).   linezolid  600 MG tablet Commonly known as: ZYVOX  Take 1 tablet (600 mg total) by mouth every 12 (twelve) hours for 10 days.   metoprolol  succinate 25 MG 24 hr tablet Commonly known as: TOPROL -XL Take 1 tablet (25 mg total) by mouth daily.   tamsulosin  0.4 MG Caps capsule Commonly known as: FLOMAX  Take 1 capsule (0.4 mg total) by mouth daily.               Durable Medical Equipment  (From admission, onward)           Start     Ordered   06/27/24 1240  For home use only DME Bedside commode  Once       Question:  Patient needs a bedside commode to treat with the following condition  Answer:  CHF (congestive heart failure) (HCC)   06/27/24 1239   06/27/24 1237  For home use only DME Walker rolling  Once       Question Answer Comment  Walker: With 5 Inch Wheels   Patient needs a walker to treat with the following condition CHF (congestive heart failure) (HCC)      06/27/24 1239            Contact information for follow-up  providers     [facility] at [facility] Follow up in 4 week(s).   Specialty: Vascular Surgery Why: Office will call to arrange your appt(s) Contact information: [address], [address] [place]  [address] [phone]        [facility]. Go in 10 day(s).   Specialty: Cardiology Why: Hospital follow up 07/03/2024 @ 10:30 am PLEASE bring a current medication to appointment FREE valet parking, Entrance C, off [facility] information: [address] [place]   [address] [phone]        Harden Jerona GAILS, MD Follow up in 1 week(s).   Specialty: Orthopedic Surgery Contact information: [address] [place] [address] [phone]         [facility] Follow up in 2 week(s).   Why: Hospital follow up Contact information: [address] [facility] [place] [address] [phone]              Contact information  for after-discharge care     Destination     [facility] Preferred SNF .   Service: Skilled Nursing Contact information: [address] [place]  [phone]                    Discharge Exam: Fredricka Weights   06/26/24 0547 06/27/24 0610 06/28/24 0602  Weight: 51 kg 51.1 kg 53.4 kg   General exam: Awake, laying in bed, in nad Respiratory system: Normal respiratory effort, no wheezing Cardiovascular system: regular rate, s1, s2 Gastrointestinal system: Soft, nondistended, positive BS Central nervous system: CN2-12 grossly intact, strength intact Extremities: Perfused, no clubbing Skin: Normal skin turgor, no notable skin lesions seen Psychiatry: Mood normal // no visual hallucinations   Condition at discharge: fair  The results of significant diagnostics from this hospitalization  (including imaging, microbiology, ancillary and laboratory) are listed below for reference.   Imaging Studies: DG CHEST PORT 1 VIEW Result Date: 06/28/2024 CLINICAL DATA:  Chest pain. EXAM: PORTABLE CHEST 1 VIEW COMPARISON:  06/18/2024, 03/12/2024. FINDINGS: The heart is enlarged and mediastinal contours are within normal limits. There is atherosclerotic calcification of the aorta. Emphysematous changes are present in the lungs. Mild atelectasis or scarring is noted at the left lung base. No effusion or pneumothorax is seen. No acute osseous abnormality. IMPRESSION: 1. No active disease. 2. Emphysema. Electronically Signed   By: Leita Birmingham M.D.   On: 06/28/2024 15:14   CT ABDOMEN PELVIS WO CONTRAST Result Date: 06/26/2024 EXAM: CT ABDOMEN AND PELVIS WITHOUT CONTRAST 06/26/2024 04:12:00 PM TECHNIQUE: CT of the abdomen and pelvis was performed without the administration of intravenous contrast. Multiplanar reformatted images are provided for review. Automated exposure control, iterative reconstruction, and/or weight-based adjustment of the mA/kV was utilized to reduce the radiation dose to as low as reasonably achievable. COMPARISON: CT chest abdomen and pelvis 03/12/2024. CLINICAL HISTORY: Abdominal pain, acute, nonlocalized. FINDINGS: LOWER CHEST: There are small bilateral pleural effusions which have increased. There is bibasilar atelectasis. Emphysema present. There is a small pericardial effusion. LIVER: The liver is unremarkable. GALLBLADDER AND BILE DUCTS: Gallbladder is unremarkable. No biliary ductal dilatation. SPLEEN: No acute abnormality. PANCREAS: No acute abnormality. ADRENAL GLANDS: No acute abnormality. KIDNEYS, URETERS AND BLADDER: Renal calculi are present in the inferior pole of the right kidney measuring 5 mm. Right renal atrophy is present. The left kidney is within normal limits. No hydronephrosis. No perinephric or periureteral stranding. The urinary bladder is unremarkable. GI AND  BOWEL: The stomach demonstrates no acute abnormality. The appendix is not seen. There is sigmoid colon diverticulosis. There is no bowel obstruction. PERITONEUM AND RETROPERITONEUM: No ascites. No free air. VASCULATURE: An abdominal aortic aneurysm measures 4.8 x 4.0 cm, similar to the prior study. There are atherosclerotic calcifications of the aorta and iliac arteries. LYMPH NODES: No lymphadenopathy. REPRODUCTIVE ORGANS: No acute abnormality. BONES AND SOFT TISSUES: No acute osseous abnormality. No focal soft tissue abnormality. IMPRESSION: 1. No acute findings in the abdomen or pelvis. 2. Small bilateral pleural effusions with bibasilar atelectasis. 3. Small pericardial effusion. 4. Stable Infrarenal abdominal aortic aneurysm measuring 4.8 x 4.0 cm. 5. 5 mm right lower pole renal calculus with right renal atrophy. 6. Sigmoid colon diverticulosis without evidence of diverticulitis. 7. Emphysema. Electronically signed by: Greig Pique MD MD 06/26/2024 05:08 PM EST RP Workstation: [id number]   ECHOCARDIOGRAM COMPLETE Result Date: 06/21/2024    ECHOCARDIOGRAM REPORT   Patient Name:   Tery Hoeger. Date of  Exam: 06/21/2024 Medical Rec #:  [id number]          Height:       67.0 in Accession #:    [id number]         Weight:       121.7 lb Date of Birth:  1935-05-07         BSA:          1.637 m Patient Age:    89 years           BP:           95/68 mmHg Patient Gender: M                  HR:           82 bpm. Exam Location:  Inpatient Procedure: 2D Echo, 3D Echo, Cardiac Doppler and Color Doppler (Both Spectral            and Color Flow Doppler were utilized during procedure). Indications:    CHF- Acute Systolic I50.21  History:        Patient has prior history of Echocardiogram examinations, most                 recent 03/13/2024. CKD; Risk Factors:Diabetes, Hypertension and                 Dyslipidemia.  Sonographer:    Koleen Popper RDCS Referring Phys: ANASTASSIA DOUTOVA  Sonographer Comments: Image  acquisition challenging due to patient body habitus. IMPRESSIONS  1. Left ventricular ejection fraction, by estimation, is 25 to 30%. The left ventricle has severely decreased function. The left ventricle demonstrates global hypokinesis. There is moderate concentric left ventricular hypertrophy. Left ventricular diastolic function could not be evaluated.  2. Right ventricular systolic function is moderately reduced. The right ventricular size is normal. Severely increased right ventricular wall thickness. There is mildly elevated pulmonary artery systolic pressure. The estimated right ventricular systolic pressure is 42.3 mmHg.  3. Moderate pericardial effusion. The pericardial effusion is circumferential and anterior to the right ventricle.  4. The mitral valve is degenerative. Trivial mitral valve regurgitation. No evidence of mitral stenosis.  5. The aortic valve is calcified. There is moderate calcification of the aortic valve. There is moderate thickening of the aortic valve. Aortic valve regurgitation is not visualized. Aortic valve sclerosis/calcification is present, without any evidence of aortic stenosis.  6. The inferior vena cava is dilated in size with >50% respiratory variability, suggesting right atrial pressure of 8 mmHg.  7. Given findings of severe biventricular hypertrophy with speckeled pattern, biventricular failure, biatrial enlargement and pericardial effusion, consider amyloidosis FINDINGS  Left Ventricle: Left ventricular ejection fraction, by estimation, is 25 to 30%. The left ventricle has severely decreased function. The left ventricle demonstrates global hypokinesis. The left ventricular internal cavity size was normal in size. There is moderate concentric left ventricular hypertrophy. Left ventricular diastolic function could not be evaluated. Right Ventricle: The right ventricular size is normal. Severely increased right ventricular wall thickness. Right ventricular systolic function is  moderately reduced. There is mildly elevated pulmonary artery systolic pressure. The tricuspid regurgitant velocity is 2.93 m/s, and with an assumed right atrial pressure of 8 mmHg, the estimated right ventricular systolic pressure is 42.3 mmHg. Left Atrium: Left atrial size was normal in size. Right Atrium: Right atrial size was normal in size. Pericardium: A moderately sized pericardial effusion is present. The pericardial effusion is circumferential and anterior to the right ventricle. Mitral Valve: The  mitral valve is degenerative in appearance. There is moderate thickening of the mitral valve leaflet(s). Trivial mitral valve regurgitation. No evidence of mitral valve stenosis. Tricuspid Valve: The tricuspid valve is normal in structure. Tricuspid valve regurgitation is mild . No evidence of tricuspid stenosis. Aortic Valve: The aortic valve is calcified. There is moderate calcification of the aortic valve. There is moderate thickening of the aortic valve. Aortic valve regurgitation is not visualized. Aortic valve sclerosis/calcification is present, without any  evidence of aortic stenosis. Pulmonic Valve: The pulmonic valve was normal in structure. Pulmonic valve regurgitation is not visualized. No evidence of pulmonic stenosis. Aorta: The aortic root is normal in size and structure. Venous: The inferior vena cava is dilated in size with greater than 50% respiratory variability, suggesting right atrial pressure of 8 mmHg. IAS/Shunts: The interatrial septum appears to be lipomatous. No atrial level shunt detected by color flow Doppler.  LEFT VENTRICLE PLAX 2D LVIDd:         3.80 cm      Diastology LVIDs:         3.25 cm      LV e' medial:   3.85 cm/s LV PW:         1.40 cm      LV E/e' medial: 26.2 LV IVS:        1.60 cm LVOT diam:     1.80 cm LV SV:         23 LV SV Index:   14 LVOT Area:     2.54 cm  LV Volumes (MOD) LV vol d, MOD A4C: 113.0 ml LV vol s, MOD A4C: 79.9 ml LV SV MOD A4C:     113.0 ml RIGHT  VENTRICLE            IVC RV S prime:     9.96 cm/s  IVC diam: 2.10 cm TAPSE (M-mode): 1.1 cm LEFT ATRIUM           Index LA diam:      3.60 cm 2.20 cm/m LA Vol (A2C): 43.6 ml 26.63 ml/m LA Vol (A4C): 54.0 ml 32.98 ml/m  AORTIC VALVE LVOT Vmax:   57.80 cm/s LVOT Vmean:  36.400 cm/s LVOT VTI:    0.089 m  AORTA Ao Root diam: 3.50 cm MITRAL VALVE                TRICUSPID VALVE MV Area (PHT): 5.38 cm     TR Peak grad:   34.3 mmHg MV Decel Time: 141 msec     TR Vmax:        293.00 cm/s MV E velocity: 101.00 cm/s MV A velocity: 39.30 cm/s   SHUNTS MV E/A ratio:  2.57         Systemic VTI:  0.09 m                             Systemic Diam: 1.80 cm Wilbert Bihari MD Electronically signed by Wilbert Bihari MD Signature Date/Time: 06/21/2024/11:15:17 AM    Final    VAS US  ABI WITH/WO TBI Result Date: 06/20/2024  LOWER EXTREMITY DOPPLER STUDY Patient Name:  Rena MARSEL GAIL.  Date of Exam:   06/19/2024 Medical Rec #: [id number]           Accession #:    [id number] Date of Birth: 12-Jul-1934          Patient Gender: M Patient Age:   3 years Exam  Location:  [facility] Procedure:      VAS US  ABI WITH/WO TBI Referring Phys: ANASTASSIA DOUTOVA --------------------------------------------------------------------------------  Indications: Ulceration. High Risk Factors: Hypertension, Diabetes, current smoker.  Limitations: Today's exam was limited due to an open wound and involuntary              patient movement. Comparison Study: 03/13/2024 - Right: Resting right ankle-brachial index is                   within normal range. The                   right toe-brachial index is abnormal.                    Left: Resting left ankle-brachial index indicates                   noncompressible left                   lower extremity arteries. The left toe-brachial index is                   absent. Performing Technologist: Gerome Ny RVT  Examination Guidelines: A complete evaluation includes at minimum, Doppler waveform signals and  systolic blood pressure reading at the level of bilateral brachial, anterior tibial, and posterior tibial arteries, when vessel segments are accessible. Bilateral testing is considered an integral part of a complete examination. Photoelectric Plethysmograph (PPG) waveforms and toe systolic pressure readings are included as required and additional duplex testing as needed. Limited examinations for reoccurring indications may be performed as noted.  ABI Findings: +---------+------------------+-----+----------+--------+ Right    Rt Pressure (mmHg)IndexWaveform  Comment  +---------+------------------+-----+----------+--------+ Brachial 136                    triphasic          +---------+------------------+-----+----------+--------+ PTA      131               0.96 monophasic         +---------+------------------+-----+----------+--------+ DP       129               0.95 monophasic         +---------+------------------+-----+----------+--------+ Great Toe63                0.46                    +---------+------------------+-----+----------+--------+ +---------+------------------+-----+----------+-------+ Left     Lt Pressure (mmHg)IndexWaveform  Comment +---------+------------------+-----+----------+-------+ Brachial 130                    triphasic         +---------+------------------+-----+----------+-------+ PTA      150               1.10 monophasic        +---------+------------------+-----+----------+-------+ DP       78                0.57 monophasic        +---------+------------------+-----+----------+-------+ Great Toe                       Absent    Ulcer   +---------+------------------+-----+----------+-------+ +-------+-----------+-----------+------------+------------+ ABI/TBIToday's ABIToday's TBIPrevious ABIPrevious TBI +-------+-----------+-----------+------------+------------+ Right  0.96       0.46                                 +-------+-----------+-----------+------------+------------+  Left   1.1                                            +-------+-----------+-----------+------------+------------+  Summary: Right: Resting right ankle-brachial index is within normal range. The right toe-brachial index is abnormal.  ABI's are likely falsely elevated due to medial calcification. Left: Resting left ankle-brachial index is within normal range.  ABI's are likely falsely elevated due to medial calcification. Unable to obtain TBI due to great toe ulceration. *See table(s) above for measurements and observations.  Electronically signed by Penne Colorado MD on 06/20/2024 at 10:40:50 AM.    Final    PERIPHERAL VASCULAR CATHETERIZATION Result Date: 06/19/2024 DATE OF SERVICE: 06/19/2024  PATIENT:  [name]  89 y.o. 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 ulceration  POST-OPERATIVE DIAGNOSIS:  Same  PROCEDURE:  1) Ultrasound guided right common femoral artery access 2) Aortogram with evaluation of the renal arteries 3) Left lower extremity angiogram with second order cannulation 4) Additional left lower extremity with third order cannulation 5) Ultrasound guided retrograde left posterior tibial access at the ankle 6) Complex angioplasty and stenting of the left femoropopliteal artery (6x125mm Eluvia x2) 7) Complex angioplasty of the left tibioperoneal trunk (3x154mm Sterling) 8) Complex angioplasty of the left posterior tibial artery (3x19mm Sterling) 9) Conscious sedation (42 minutes)  SURGEON:  Debby SAILOR. Magda, MD  ASSISTANT: none  ANESTHESIA:   local and IV sedation  ESTIMATED BLOOD LOSS: min  LOCAL MEDICATIONS USED:  LIDOCAINE   COUNTS: confirmed correct.  PATIENT DISPOSITION:  PACU - hemodynamically stable.  Delay start of Pharmacological VTE agent (>24hrs) due to surgical blood loss or risk of bleeding: no  INDICATION FOR PROCEDURE: Deon Duer. is a 89 y.o. male with  left great toe ischemic ulceration in the setting of severe peripheral arterial disease. After careful discussion of risks, benefits, and alternatives the patient was offered angiography with possible intervention. The patient understood and wished to proceed.  OPERATIVE FINDINGS:  Globally slow transit of contrast worrisome for heart failure.  Aortogram: Renal arteries Left: >60% stenosis    Right: >60% stenosis Infrarenal aorta: patent Common iliac arteries: Left: patent      Right: patent Internal iliac arteries: Left: patent      Right: patent External iliac arteries: Left: patent      Right: patent  Left Lower Extremity Angiogram:             Common femoral artery: patent             Profunda femoris artery: two, codominant profunda femoris arteries that are patent             Superficial femoral artery: patent proximally; occluded in mid vessel. Severely diseased throughout the distal vessel. Calcified lesion.             Popliteal artery: reconstitutes above the knee. Severely diseased above and behind the knee. Calcified.             Anterior tibial artery: patent proximally. Occludes in proximal calf. DP reconstitutes in the foot.             Tibioperoneal trunk: occluded             Peroneal artery: occluded             Posterior tibial  artery: occluded proximally. Reconstitutes in the calf. Dominant tibial. Fills the foot             Pedal circulation: Disadvantaged  GLASS score. FP: 3. IP: 3  WIfI score. Wound: 2; ischemia: 3; infection: 0. Stage: 4  DESCRIPTION OF PROCEDURE: After identification of the patient in the pre-operative holding area, the patient was transferred to the operating room. The patient was positioned supine on the operating room table. Anesthesia was induced. The groins was prepped and draped in standard fashion. A surgical pause was performed confirming correct patient, procedure, and operative location.  The right groin was anesthetized with subcutaneous injection of 1% lidocaine .  Using ultrasound guidance, the right common femoral artery was accessed with micropuncture technique.  Fluoroscopy was used to confirm cannulation over the femoral head. The 40F micropuncture sheath was upsized to 50F.  A Benson wire was advanced into the distal aorta. Over the wire an omni flush catheter was advanced to the level of L2. Aortogram was performed - see above for details.  The left common iliac artery was selected with an omniflush catheter and glidewire advantage guidewire. The wire was advanced into the common femoral artery. Over the wire the omni flush catheter was advanced into the external iliac artery. Selective angiography was performed - see above for details.  The decision was made to intervene. The patient was heparinized with 6000 units of heparin . The 50F sheath was exchanged for a 48F x 45cm sheath. Selective angiography of the left lower extremity performed prior to intervention.  I was able to cross the femoral-popliteal occlusion antegrade.  Below the knee there was a flush occlusion at the origin of the tibioperoneal trunk which I could not cross antegrade.  Posterior tibial artery at the ankle was accessed in a retrograde fashion using ultrasound access with a micropuncture kit.  After obtaining access, wire was navigated into the posterior tibial artery and a micro sheath introduced into the artery.  An 018 12 g weighted tip shepherd wire was used to cross the posterior tibial and tibioperoneal trunk occlusion.  After confirming our position in the true lumen the 018 wire was snared from the contralateral access with a lasso type snare.  The wire was externalized through the right common femoral artery access sheath.  We then began our intervention.  The posterior tibial artery was angioplastied up to the sheath with a 3 x 150 mm Sterling balloon.  The tibioperoneal trunk was similarly angioplastied with a 3 x 150 mm Sterling balloon.  Two 6 x 150 mm Eluvia stents were deployed in the  femoral-popliteal position from above-the-knee to proximal third of SFA.  These were postdilated with a 5 x 150 mm Sterling balloon.  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 Flow to the level of the sheath was achieved.  A Perclose was used to close the arteriotomy in the right common femoral artery. Hemostasis was excellent upon completion. The left posterior tibial sheath was left in place to be removed in the recovery area.  Conscious sedation was administered with the use of IV fentanyl  and midazolam  under continuous physician and nurse monitoring.  Heart rate, blood pressure, and oxygen saturation were continuously monitored.  Total sedation time was 42 minutes  Upon completion of the case instrument and sharps counts were confirmed correct. The patient was transferred to the PACU in good condition. I was present for all  portions of the procedure.  PLAN: Aspirin , Plavix , statin therapies.  Optimized from a vascular standpoint.  He is cleared to undergo any kind of foot surgery necessary.  Follow-up with me in 4 weeks with ABI and left lower extremity arterial duplex.  Debby SAILOR. Magda, MD [facility] Phone Number: [phone] 06/19/2024 4:18 PM    DG Chest 2 View Result Date: 06/18/2024 CLINICAL DATA:  Cellulitis.  Weakness. EXAM: CHEST - 2 VIEW; RIGHT FOOT COMPLETE - 3+ VIEW COMPARISON:  03/12/2024, 03/23/2022. FINDINGS: Chest: The heart size and mediastinal contours are within normal limits. There is atherosclerotic calcification of the aorta. Stable interstitial prominence and emphysematous changes are noted bilaterally. There is mild atelectasis or scarring at the left lung base. No effusion or pneumothorax is seen. No acute osseous abnormality. Right foot: No acute fracture or dislocation is seen. There is hallux valgus  deformity with degenerative changes at the first metatarsophalangeal joint. Degenerative changes are present in the midfoot. There is diffuse soft tissue swelling. Vascular calcifications are noted in the soft tissues. IMPRESSION: 1. Stable chest with no acute cardiopulmonary process. 2. No acute osseous abnormality in the right foot. Electronically Signed   By: Leita Birmingham M.D.   On: 06/18/2024 16:54   DG Foot Complete Right Result Date: 06/18/2024 CLINICAL DATA:  Cellulitis.  Weakness. EXAM: CHEST - 2 VIEW; RIGHT FOOT COMPLETE - 3+ VIEW COMPARISON:  03/12/2024, 03/23/2022. FINDINGS: Chest: The heart size and mediastinal contours are within normal limits. There is atherosclerotic calcification of the aorta. Stable interstitial prominence and emphysematous changes are noted bilaterally. There is mild atelectasis or scarring at the left lung base. No effusion or pneumothorax is seen. No acute osseous abnormality. Right foot: No acute fracture or dislocation is seen. There is hallux valgus deformity with degenerative changes at the first metatarsophalangeal joint. Degenerative changes are present in the midfoot. There is diffuse soft tissue swelling. Vascular calcifications are noted in the soft tissues. IMPRESSION: 1. Stable chest with no acute cardiopulmonary process. 2. No acute osseous abnormality in the right foot. Electronically Signed   By: Leita Birmingham M.D.   On: 06/18/2024 16:54   DG Foot Complete Left Result Date: 06/18/2024 CLINICAL DATA:  Cellulitis. EXAM: LEFT FOOT - COMPLETE 3+ VIEW COMPARISON:  Left foot radiograph dated 03/23/2022. FINDINGS: No acute fracture or dislocation. The bones are osteopenic. No periosteal elevation or bone erosion. The soft tissue swelling of the foot. No acute pathology or soft tissue gas. Vascular calcification. IMPRESSION: 1. No acute fracture or dislocation. 2. Soft tissue swelling of the foot. Electronically Signed   By: Vanetta Chou M.D.   On: 06/18/2024 16:50     Microbiology: Results for orders placed or performed during the hospital encounter of 06/18/24  Culture, blood (Routine X 2) w Reflex to ID Panel     Status: None   Collection Time: 06/20/24  7:39 PM   Specimen: BLOOD  Result Value Ref Range Status   Specimen Description BLOOD SITE NOT SPECIFIED  Final   Special Requests   Final    BOTTLES DRAWN AEROBIC AND ANAEROBIC Blood Culture adequate volume   Culture   Final    NO GROWTH 5 DAYS Performed at [facility], [address] [place], [place] [address]    Report Status 06/25/2024 FINAL  Final  Culture, blood (Routine X 2) w Reflex to ID Panel     Status: None   Collection Time: 06/20/24  7:51  PM   Specimen: BLOOD  Result Value Ref Range Status   Specimen Description BLOOD SITE NOT SPECIFIED  Final   Special Requests   Final    BOTTLES DRAWN AEROBIC AND ANAEROBIC Blood Culture adequate volume   Culture   Final    NO GROWTH 5 DAYS Performed at [facility], [address], [place], [place] [address]    Report Status 06/25/2024 FINAL  Final    Labs: CBC: Recent Labs  Lab 06/26/24 1459  WBC 4.7  HGB 10.7*  HCT 30.9*  MCV 96.0  PLT 145*   Basic Metabolic Panel: Recent Labs  Lab 06/23/24 0339 06/23/24 0340 06/23/24 1201 06/24/24 0331 06/25/24 0415 06/26/24 1459 06/27/24 0302  NA 137 137  --  137 136 138 134*  K 5.2* 5.2* 5.5* 5.4* 5.0 4.6 5.0  CL 103 102  --  106 104 105 104  CO2 26 26  --  23 22 21* 19*  GLUCOSE 167* 165*  --  190* 146* 136* 104*  BUN 44* 44*  --  41* 40* 42* 41*  CREATININE 2.13* 2.11*  --  2.11* 2.25* 2.32* 2.17*  CALCIUM  8.6* 8.4*  --  9.0 8.7* 8.6* 8.4*  MG 1.9  --   --   --   --   --   --   PHOS  --  3.4  --   --   --   --   --    Liver Function Tests: Recent Labs  Lab 06/23/24 0340 06/25/24 0415 06/26/24 1459  AST  --  18 21  ALT  --  16 19  ALKPHOS  --  95 96  BILITOT  --  0.4 0.5  PROT  --  5.4* 5.7*  ALBUMIN 3.3* 3.2* 3.4*   CBG: Recent Labs  Lab  06/28/24 0631 06/28/24 1207 06/28/24 1722 06/28/24 2116 06/29/24 0901  GLUCAP 141* 142* 147* 181* 141*    Discharge time spent: less than 30 minutes.  Signed: Garnette Pelt, MD [facility] 06/29/2024 "

## 2024-06-29 NOTE — Progress Notes (Addendum)
 Discharge   Patient's Nephew Mr. Elner expressed verbal understanding of discharge POC.   Patient' s nephew given time to ask any questions.  Additional education included in AVS.  Alert oriented in good spirits.   PIV removed. Pressure dressings intact.  All personal belongings at bedtime. DME at bedside. Mr Petsch refused the [facility].   BSC receive by Nurse Secretary Ms Jerelene and placed in the staff conference room with patient label on it.  RN and CM notified via secure chat. Patient agreed to take his abx augmentin  po this am.  Patient refused to take any other morning medications that were due.  Primary RN at bedside to give abx po.  Discharging to main A.   Volunteer services called.

## 2024-06-30 ENCOUNTER — Ambulatory Visit (HOSPITAL_COMMUNITY)

## 2024-06-30 ENCOUNTER — Ambulatory Visit: Admitting: Vascular Surgery

## 2024-07-02 ENCOUNTER — Telehealth (HOSPITAL_COMMUNITY): Payer: Self-pay

## 2024-07-02 NOTE — Telephone Encounter (Signed)
 Called to confirm/remind patient of their appointment at the [facility] on 07/03/24 10:30.   Appointment:   [] Confirmed  [] Left mess   [x] No answer/No voice mail  [] VM Full/unable to leave message  [] Phone not in service  Patient reminded to bring all medications and/or complete list.  Confirmed patient has transportation. Gave directions, instructed to utilize valet parking.

## 2024-07-03 ENCOUNTER — Ambulatory Visit (HOSPITAL_COMMUNITY)

## 2024-07-03 NOTE — Progress Notes (Incomplete)
 "    HEART & VASCULAR TRANSITION OF CARE CONSULT NOTE     Referring Physician: Dr. Cindy with [facility] PCP: [facility]   Chief Complaint:   HPI: Referred to clinic by Dr. Cindy for heart failure consultation.   Vincent Cedillos. is a 89 y.o. male with history of HFrEF, renal mass, CKD IIIb/IV, renal mass, AAA followed by [facility], PAD, DM II, HTN, longstanding tobacco use. [name] most of his care through the [facility].  EF 40-45% on echo in 10/23.  Echo 9/25: LVEF 25-30%, severe LVH, severely increased RV wall thickness. Concern for cardiac amyloidosis.  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 Seen by [facility] for critical limb ischemia and underwent angioplasty/stenting left femoropopliteal artery and angioplasty to left TP trunk and left posterior tibial artery. Seen by Dr. Harden and plan was for observation of wound healing.   Course complicated by ADHF. He was not seen by Cardiology. He was initially diuresed with IV lasix . Diuretics later held. Had a bump in Scr after IV contrast and given IV hydration. He was not discharged on diuretic or GDMT other than toprol  XL. Echo during admit with EF 25-30%, moderate concentric LVH, severe RV wall thickening, RV function moderately reduced, BAE, moderate pericardial effusion, AoV thickened. Combination of echo findings suspicious for cardiac amyloidosis.    Cardiac Testing    Past Medical History:  Diagnosis Date   Diabetes mellitus 01/2009 dx   DYSLIPIDEMIA    GERD    Hypertension    Kidney stones    RENAL CALCULUS 01/04/2009    Current Outpatient Medications  Medication Sig Dispense Refill   amoxicillin -clavulanate (AUGMENTIN ) 500-125 MG tablet Take 1 tablet by mouth every 12 (twelve) hours for 10 days. 20 tablet 0   aspirin  EC 81 MG tablet Take 1 tablet (81 mg total) by mouth daily. Swallow whole. 30 tablet 0   atorvastatin  (LIPITOR) 40 MG tablet Take 1 tablet (40 mg  total) by mouth daily. 30 tablet 0   clopidogrel  (PLAVIX ) 75 MG tablet Take 1 tablet (75 mg total) by mouth daily with breakfast. 30 tablet 0   HYDROcodone -acetaminophen  (NORCO/VICODIN) 5-325 MG tablet Take 1 tablet by mouth every 4 (four) hours as needed for moderate pain (pain score 4-6). 15 tablet 0   linezolid  (ZYVOX ) 600 MG tablet Take 1 tablet (600 mg total) by mouth every 12 (twelve) hours for 10 days. 20 tablet 0   metoprolol  succinate (TOPROL -XL) 25 MG 24 hr tablet Take 1 tablet (25 mg total) by mouth daily. 30 tablet 0   tamsulosin  (FLOMAX ) 0.4 MG CAPS capsule Take 1 capsule (0.4 mg total) by mouth daily. 30 capsule 0   No current facility-administered medications for this visit.    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   Social Drivers of Health   Tobacco Use: High Risk (06/18/2024)   Patient History    Smoking Tobacco Use: Every Day    Smokeless Tobacco Use: Never    Passive Exposure: Not on file  Financial Resource Strain: Low Risk (06/23/2024)   Overall Financial Resource Strain (CARDIA)    Difficulty of Paying Living Expenses: Not hard at all  Food Insecurity: No Food Insecurity (06/19/2024)   Epic    Worried  About Radiation Protection Practitioner of Food in the Last Year: Never true    Ran Out of Food in the Last Year: Never true  Transportation Needs: No Transportation Needs (06/23/2024)   Epic    Lack of Transportation (Medical): No    Lack of Transportation (Non-Medical): No  Physical Activity: Not on file  Stress: Not on file  Social Connections: Socially Isolated (06/19/2024)   Social Connection and Isolation Panel    Frequency of Communication with Friends and Family: Once a week    Frequency of Social Gatherings with Friends and Family: Once a week    Attends Religious Services: Never    Database Administrator or Organizations: No    Attends Banker Meetings: Never    Marital Status: Never married  Intimate Partner Violence: Not At Risk (06/19/2024)   Epic    Fear of Current or Ex-Partner: No    Emotionally Abused: No    Physically Abused: No    Sexually Abused: No  Depression (PHQ2-9): Not on file  Alcohol Screen: Low Risk (06/23/2024)   Alcohol Screen    Last Alcohol Screening Score (AUDIT): 0  Housing: Unknown (06/23/2024)   Epic    Unable to Pay for Housing in the Last Year: No    Number of Times Moved in the Last Year: Not on file    Homeless in the Last Year: No  Utilities: Not At Risk (06/19/2024)   Epic    Threatened with loss of utilities: No  Health Literacy: Not on file      Family History  Problem Relation Age of Onset   Hypertension Mother     There were no vitals filed for this visit.  PHYSICAL EXAM: General:  Well appearing. No respiratory difficulty HEENT: normal Neck: supple. no JVD. Carotids 2+ bilat; no bruits. No lymphadenopathy or thryomegaly appreciated. Cor: PMI nondisplaced. Regular rate & rhythm. No rubs, gallops or murmurs. Lungs: clear Abdomen: soft, nontender, nondistended. No hepatosplenomegaly. No bruits or masses. Good bowel sounds. Extremities: no cyanosis, clubbing, rash, edema Neuro: alert & oriented x 3, cranial nerves grossly intact. moves all 4  extremities w/o difficulty. Affect pleasant.  ECG:   ASSESSMENT & PLAN: HFrEF -Echo 1/26: EF 25-30%, moderate concentric LVH, severe RV wall thickening, RV function moderately reduced, BAE, moderate pericardial effusion, AoV thickened. Combination of echo findings suspicious for cardiac amyloidosis.    CKD IIIb/IV     Referred to HFSW (PCP, Medications, Transportation, ETOH Abuse, Drug Abuse, Insurance, Financial ): Yes or No Refer to Pharmacy: Yes or No Refer to Home Health: Yes on No Refer to [facility]: Yes or no  Refer to General Cardiology: Yes or No  Follow up       [1]  No Known Allergies  "

## 2024-07-08 ENCOUNTER — Other Ambulatory Visit: Payer: Self-pay | Admitting: Vascular Surgery

## 2024-07-08 DIAGNOSIS — I739 Peripheral vascular disease, unspecified: Secondary | ICD-10-CM

## 2024-07-08 DIAGNOSIS — I7143 Infrarenal abdominal aortic aneurysm, without rupture: Secondary | ICD-10-CM

## 2024-07-28 ENCOUNTER — Ambulatory Visit: Admitting: Vascular Surgery

## 2024-07-28 ENCOUNTER — Ambulatory Visit (HOSPITAL_COMMUNITY)
